# Patient Record
Sex: Female | Born: 1975 | Race: Black or African American | Hispanic: No | Smoking: Never smoker
Health system: Southern US, Community
[De-identification: ages and names within clinical notes are randomized; demographics above are authoritative.]

## PROBLEM LIST (undated history)

## (undated) DIAGNOSIS — G43909 Migraine, unspecified, not intractable, without status migrainosus: Secondary | ICD-10-CM

## (undated) DIAGNOSIS — D649 Anemia, unspecified: Secondary | ICD-10-CM

## (undated) DIAGNOSIS — Z853 Personal history of malignant neoplasm of breast: Secondary | ICD-10-CM

## (undated) DIAGNOSIS — Z9221 Personal history of antineoplastic chemotherapy: Secondary | ICD-10-CM

## (undated) DIAGNOSIS — C50919 Malignant neoplasm of unspecified site of unspecified female breast: Secondary | ICD-10-CM

## (undated) DIAGNOSIS — G620 Drug-induced polyneuropathy: Secondary | ICD-10-CM

## (undated) DIAGNOSIS — T7840XA Allergy, unspecified, initial encounter: Secondary | ICD-10-CM

## (undated) DIAGNOSIS — T451X5A Adverse effect of antineoplastic and immunosuppressive drugs, initial encounter: Secondary | ICD-10-CM

## (undated) DIAGNOSIS — Z923 Personal history of irradiation: Secondary | ICD-10-CM

## (undated) DIAGNOSIS — N6489 Other specified disorders of breast: Secondary | ICD-10-CM

## (undated) HISTORY — DX: Allergy, unspecified, initial encounter: T78.40XA

## (undated) HISTORY — PX: COSMETIC SURGERY: SHX468

## (undated) HISTORY — DX: Malignant neoplasm of unspecified site of unspecified female breast: C50.919

---

## 1998-06-18 ENCOUNTER — Other Ambulatory Visit: Admission: RE | Admit: 1998-06-18 | Discharge: 1998-06-18 | Payer: Self-pay | Admitting: Obstetrics

## 1998-06-18 ENCOUNTER — Encounter: Admission: RE | Admit: 1998-06-18 | Discharge: 1998-06-18 | Payer: Self-pay | Admitting: Obstetrics

## 1998-07-09 ENCOUNTER — Encounter: Admission: RE | Admit: 1998-07-09 | Discharge: 1998-07-09 | Payer: Self-pay | Admitting: Obstetrics

## 2001-02-20 ENCOUNTER — Inpatient Hospital Stay (HOSPITAL_COMMUNITY): Admission: AD | Admit: 2001-02-20 | Discharge: 2001-02-22 | Payer: Self-pay | Admitting: Obstetrics and Gynecology

## 2004-10-25 ENCOUNTER — Emergency Department (HOSPITAL_COMMUNITY): Admission: EM | Admit: 2004-10-25 | Discharge: 2004-10-25 | Payer: Self-pay | Admitting: Emergency Medicine

## 2004-11-15 ENCOUNTER — Emergency Department (HOSPITAL_COMMUNITY): Admission: EM | Admit: 2004-11-15 | Discharge: 2004-11-15 | Payer: Self-pay | Admitting: Emergency Medicine

## 2005-05-04 ENCOUNTER — Emergency Department (HOSPITAL_COMMUNITY): Admission: EM | Admit: 2005-05-04 | Discharge: 2005-05-04 | Payer: Self-pay | Admitting: Emergency Medicine

## 2007-06-25 ENCOUNTER — Inpatient Hospital Stay (HOSPITAL_COMMUNITY): Admission: AD | Admit: 2007-06-25 | Discharge: 2007-06-28 | Payer: Self-pay | Admitting: Obstetrics & Gynecology

## 2008-11-24 ENCOUNTER — Emergency Department (HOSPITAL_COMMUNITY): Admission: EM | Admit: 2008-11-24 | Discharge: 2008-11-24 | Payer: Self-pay | Admitting: Family Medicine

## 2008-11-26 ENCOUNTER — Emergency Department (HOSPITAL_COMMUNITY): Admission: EM | Admit: 2008-11-26 | Discharge: 2008-11-26 | Payer: Self-pay | Admitting: Family Medicine

## 2009-05-06 ENCOUNTER — Emergency Department (HOSPITAL_COMMUNITY): Admission: EM | Admit: 2009-05-06 | Discharge: 2009-05-06 | Payer: Self-pay | Admitting: Family Medicine

## 2009-05-07 ENCOUNTER — Emergency Department (HOSPITAL_COMMUNITY): Admission: EM | Admit: 2009-05-07 | Discharge: 2009-05-07 | Payer: Self-pay | Admitting: Family Medicine

## 2009-12-23 ENCOUNTER — Other Ambulatory Visit: Admission: RE | Admit: 2009-12-23 | Discharge: 2009-12-23 | Payer: Self-pay | Admitting: Obstetrics and Gynecology

## 2010-05-11 ENCOUNTER — Emergency Department (HOSPITAL_COMMUNITY)
Admission: EM | Admit: 2010-05-11 | Discharge: 2010-05-11 | Payer: Self-pay | Source: Home / Self Care | Admitting: Emergency Medicine

## 2010-08-16 LAB — POCT URINALYSIS DIP (DEVICE)
Bilirubin Urine: NEGATIVE
Hgb urine dipstick: NEGATIVE
Nitrite: NEGATIVE
Protein, ur: NEGATIVE mg/dL
pH: 7 (ref 5.0–8.0)

## 2010-10-01 NOTE — H&P (Signed)
[facility]  Patient:    Susan Wilson, Susan Wilson Visit Number: [id number] MRN: [id number]          Service Type: OBS Location: 910A [id number] Attending Physician:  Leonard Schwartz Dictated by:   Nigel Bridgeman, C.N.M. Admit Date:  02/20/2001                           History and Physical  DATE OF BIRTH:                [birth year]  HISTORY OF PRESENT ILLNESS:   Susan Wilson is a 35 year old, gravida 1, para 0, at 34-6/7 weeks by early ultrasound who presented to [facility] today as a new patient with labor diagnosed and cervix 5 cm, 100%, vertex, -1 station with bulging bag of water.  The patient had scheduled a new OB appointment next week and had previously reported prenatal care had occurred in [place], [place].  However, upon further investigation by telephone calls to the [facility] and the pregnancy care clinic that the patient noted she had received care, it appears the patient had no ongoing prenatal care at all.  Dr. Stefano Gaul has elected to continue to provide care for this patient since the patient had been approved to come to [facility] for care, although the practice was unaware of the lack of prenatal care.  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  She had a colposcopy in February 2000 at [facility], but no apparent followup occurred from that time.  3) History of HPV lesions.  PRENATAL LABORATORY DATA:     Have been drawn with sickle cell trait and are pending along with a CBC.  She did have a Pap smear in April 2002 that was within normal limits.  HISTORY OF PRESENT PREGNANCY: The patient denies any problems with this pregnancy.  She was originally seen in April 2002 at [facility].   She, at that time, was considering termination of pregnancy, but she did not return to that clinic. The patient denies any bleeding or any other problems during this pregnancy. The patients last menstrual period was June 30, 2000.  OBSTETRICAL HISTORY:          The patient is a primigravida.  PAST MEDICAL HISTORY:         She has a history of anemia for which she is taking a vitamin with iron.  She denies any other medical problems.  She does wear reading glasses.  MEDICATIONS:                  None.  ALLERGIES:                    None.  FAMILY HISTORY:               Her maternal grandmother and maternal grandfather are deceased from hypertension.  Her maternal grandfather also had diabetes.  Her maternal grandfather also had some type of cancer.  There is no evidence of any genetic anomalies in the family.  SOCIAL HISTORY:               She has had occasional alcohol use.  The patient is single.  She has  been previously employed.  Her family is involved and supportive.  She denies any alcohol, drug, or tobacco use during this pregnancy.  PHYSICAL EXAMINATION:  VITAL SIGNS:                  Stable.  The patient is afebrile.  HEENT:                        Within normal limits.  LUNGS:                        Bilateral breath sounds are clear.  HEART:                        Regular rate and rhythm without murmur.  BREASTS:                      Soft and nontender.  ABDOMEN:                      Fundal height is approximately 34 cm.  Estimated fetal weight 4 to 5 pounds.  Uterine contractions are every 3 minutes, moderate quality.  PELVIC:                       Cervical exam by Susan Wilson, C.N.M. was 5 cm, 100%, vertex, -1 station with bulging bag of water.  EXTREMITIES:                  Deep tendon reflexes are 2+ without clonus. There is a trace edema noted.  Fetal heart rate is reassuring with a negative spontaneous CST.  LABORATORY DATA:              Group B  strep culture was done at the office.  IMPRESSION:                   1. Intrauterine pregnancy at 34-6/7 weeks.                               2. No prenatal care.                               3. Preterm.  PLAN:                         1. Admit to birthing suite for consult with                                  Dr. Marline Wilson as attending physician.                               2. Routine physician orders.                               3. Plan group B Streptococcus prophylaxis with                                  penicillin G secondary to preterm status.  4. Prenatal package with sickle cell test was                                  drawn.                               5. NICU was notified of the patients preterm                                  status.                               6. Epidural and IV pain medication p.r.n. Dictated by:   Nigel Bridgeman, C.N.M. Attending Physician:  Leonard Schwartz DD:  02/20/01 TD:  02/20/01 Job: [id number]

## 2011-02-04 LAB — CBC
Hemoglobin: 10.3 — ABNORMAL LOW
MCHC: 32.8
MCHC: 33
MCV: 77.5 — ABNORMAL LOW
Platelets: 190
Platelets: 220
RBC: 3.8 — ABNORMAL LOW
RDW: 14.8
WBC: 13.4 — ABNORMAL HIGH

## 2014-10-20 ENCOUNTER — Other Ambulatory Visit: Payer: Self-pay

## 2014-10-20 ENCOUNTER — Emergency Department (HOSPITAL_COMMUNITY): Payer: 59

## 2014-10-20 ENCOUNTER — Encounter (HOSPITAL_COMMUNITY): Payer: Self-pay

## 2014-10-20 ENCOUNTER — Emergency Department (HOSPITAL_COMMUNITY)
Admission: EM | Admit: 2014-10-20 | Discharge: 2014-10-20 | Disposition: A | Discharge status: Home / Self Care | Payer: 59 | Attending: Emergency Medicine | Admitting: Emergency Medicine

## 2014-10-20 DIAGNOSIS — N6310 Unspecified lump in the right breast, unspecified quadrant: Secondary | ICD-10-CM

## 2014-10-20 DIAGNOSIS — R079 Chest pain, unspecified: Secondary | ICD-10-CM

## 2014-10-20 DIAGNOSIS — N63 Unspecified lump in breast: Secondary | ICD-10-CM | POA: Diagnosis not present

## 2014-10-20 LAB — CBC WITH DIFFERENTIAL/PLATELET
Basophils Absolute: 0 10*3/uL (ref 0.0–0.1)
Basophils Relative: 0 % (ref 0–1)
Eosinophils Absolute: 0.1 10*3/uL (ref 0.0–0.7)
Eosinophils Relative: 1 % (ref 0–5)
HCT: 36.3 % (ref 36.0–46.0)
Hemoglobin: 11.2 g/dL — ABNORMAL LOW (ref 12.0–15.0)
Lymphocytes Relative: 47 % — ABNORMAL HIGH (ref 12–46)
Lymphs Abs: 2.4 10*3/uL (ref 0.7–4.0)
MCH: 23.9 pg — ABNORMAL LOW (ref 26.0–34.0)
MCHC: 30.9 g/dL (ref 30.0–36.0)
MCV: 77.4 fL — ABNORMAL LOW (ref 78.0–100.0)
Monocytes Absolute: 0.4 10*3/uL (ref 0.1–1.0)
Monocytes Relative: 7 % (ref 3–12)
Neutro Abs: 2.3 10*3/uL (ref 1.7–7.7)
Neutrophils Relative %: 45 % (ref 43–77)
Platelets: 305 10*3/uL (ref 150–400)
RBC: 4.69 MIL/uL (ref 3.87–5.11)
RDW: 14.6 % (ref 11.5–15.5)
WBC: 5.2 10*3/uL (ref 4.0–10.5)

## 2014-10-20 LAB — BASIC METABOLIC PANEL
Anion gap: 7 (ref 5–15)
BUN: 14 mg/dL (ref 6–20)
CO2: 23 mmol/L (ref 22–32)
Calcium: 8.9 mg/dL (ref 8.9–10.3)
Chloride: 106 mmol/L (ref 101–111)
Creatinine, Ser: 0.72 mg/dL (ref 0.44–1.00)
GFR calc Af Amer: >60 mL/min (ref >60)
GFR calc non Af Amer: >60 mL/min (ref >60)
Glucose, Bld: 88 mg/dL (ref 65–99)
Potassium: 5 mmol/L (ref 3.5–5.1)
Sodium: 136 mmol/L (ref 135–145)

## 2014-10-20 LAB — I-STAT TROPONIN, ED: Troponin i, poc: 0 ng/mL (ref 0.00–0.08)

## 2014-10-20 MED ORDER — TRAMADOL HCL 50 MG PO TABS: 50.0000 mg | ORAL_TABLET | Freq: Four times a day (QID) | ORAL | Status: DC | PRN

## 2014-10-20 MED ORDER — NAPROXEN 500 MG PO TABS: 500.0000 mg | ORAL_TABLET | Freq: Two times a day (BID) | ORAL | Status: DC

## 2014-10-20 NOTE — ED Provider Notes (Signed)
CSN: [id number]     Arrival date & time 10/20/14  1145 History   First MD Initiated Contact with Patient 10/20/14 1634     Chief Complaint  Patient presents with  . Chest Pain     (Consider location/radiation/quality/duration/timing/severity/associated sxs/prior Treatment) HPI  Pt is a 39yo female presenting to ED with c/o gradually worsening Right breast mass for 1 month.  Pain is aching and sore, worse with palpation, worse when pt lies on her Right side. She has taken ibuprofen with minimal relief.  Pt states she tried to see her GYN but co-pay was $60.  Pt states she has had mild intermittent lightheadedness with Right sided breast pain earlier this week but none today. Denies fever, chills, n/v/d. Denies cough, congestion, SOB.  Denies unexplained weight loss. Denies nipple discharge. Denies skin changes. Denies hx of abscesses. Denies hx of cancer, CAD, or asthma.  History reviewed. No pertinent past medical history. History reviewed. No pertinent past surgical history. No family history on file. History  Substance Use Topics  . Smoking status: Never Smoker   . Smokeless tobacco: Not on file  . Alcohol Use: Yes     Comment: rarely   OB History    No data available     Review of Systems  Constitutional: Negative for fever, chills, diaphoresis, appetite change and fatigue.  Respiratory: Negative for cough and shortness of breath.   Cardiovascular: Positive for chest pain. Negative for palpitations. Leg swelling: Under Right breast.  Skin: Negative for rash and wound.  All other systems reviewed and are negative.     Allergies  Review of patient's allergies indicates no known allergies.  Home Medications   Prior to Admission medications   Medication Sig Start Date End Date Taking? Authorizing Provider  cyclobenzaprine (FLEXERIL) 10 MG tablet Take 10 mg by mouth 3 (three) times daily as needed for muscle spasms.   Yes Historical Provider, MD  ibuprofen (ADVIL,MOTRIN) 200  MG tablet Take 200 mg by mouth every 6 (six) hours as needed.   Yes Historical Provider, MD  naproxen (NAPROSYN) 500 MG tablet Take 1 tablet (500 mg total) by mouth 2 (two) times daily. 10/20/14   Noland Fordyce, PA-C  traMADol (ULTRAM) 50 MG tablet Take 1 tablet (50 mg total) by mouth every 6 (six) hours as needed. 10/20/14   Noland Fordyce, PA-C   BP 134/97 mmHg  Pulse 72  Temp(Src) 98.5 F (36.9 C) (Oral)  Resp 18  Ht 5' 5"  (1.651 m)  Wt 189 lb (85.73 kg)  BMI 31.45 kg/m2  SpO2 100%  LMP 09/22/2014 Physical Exam  Constitutional: She appears well-developed and well-nourished. No distress.  HENT:  Head: Normocephalic and atraumatic.  Eyes: Conjunctivae are normal. No scleral icterus.  Neck: Normal range of motion.  Cardiovascular: Normal rate, regular rhythm and normal heart sounds.   Pulmonary/Chest: Effort normal and breath sounds normal. No respiratory distress. She has no wheezes. She has no rales. She exhibits tenderness. Right breast exhibits mass (Under fold of Right breast) and tenderness. Right breast exhibits no inverted nipple and no nipple discharge. Left breast exhibits no inverted nipple, no mass, no nipple discharge, no skin change and no tenderness.    No respiratory distress, able to speak in full sentences w/o difficulty. Lungs: CTAB Pendulous breasts, 3cm area of tough skin c/w scar/fibrous tissue, tender to palpation, additional 2cm area of extending tough skin to center of chest over distal aspect sternum, non-tender. Non-mobile. No fluctuance. No erythema or ecchymosis. No  red streaking.   Abdominal: Soft. Bowel sounds are normal. She exhibits no distension and no mass. There is no tenderness. There is no rebound and no guarding.  Musculoskeletal: Normal range of motion.  Neurological: She is alert.  Skin: Skin is warm and dry. She is not diaphoretic.  Nursing note and vitals reviewed.   ED Course  Procedures (including critical care time) Labs Review Labs  Reviewed  CBC WITH DIFFERENTIAL/PLATELET - Abnormal; Notable for the following:    Hemoglobin 11.2 (*)    MCV 77.4 (*)    MCH 23.9 (*)    Lymphocytes Relative 47 (*)    All other components within normal limits  BASIC METABOLIC PANEL  I-STAT TROPOININ, ED    Imaging Review Dg Chest 2 View  10/20/2014   CLINICAL DATA:  Chest tightness for a few days.  EXAM: CHEST  2 VIEW  COMPARISON:  None.  FINDINGS: Normal heart size and mediastinal contours. No acute infiltrate or edema. No effusion or pneumothorax. No acute osseous findings.  IMPRESSION: Negative chest.   Electronically Signed   By: Monte Fantasia M.D.   On: 10/20/2014 12:38     EKG Interpretation   Date/Time:  Monday October 20 2014 11:55:45 EDT Ventricular Rate:  75 PR Interval:  168 QRS Duration: 80 QT Interval:  362 QTC Calculation: 404 R Axis:   28 Text Interpretation:  Normal sinus rhythm Low voltage QRS Cannot rule out  Anterior infarct , age undetermined Abnormal ECG No old tracing to compare  Confirmed by BELFI  MD, MELANIE ([id number]) on 10/20/2014 5:23:28 PM      MDM   Final diagnoses:  Right-sided chest pain  Breast mass, right    Pt c/o Right breast mass for 1 month, gradually worsening in size and pain. Denies fever, n/v/d. Denies SOB.  On exam, pt has pendulous breasts, rough skin that is tender to palpation under Right breast. Skin texture c/w scar tissue/fibrous tissue. Mass is not mobile. No erythema or fluctuance. Not c/w abscess. Labs: unremarkable. Pt is afebrile. Lungs: CTAB. CXR: negative.   Doubt ACS, PE, pneumonia, pneumothorax. Low concern for soft tissue infection.   Strongly encouraged pt to f/u with Breast Center for additional imaging of mass. Also encouraged to f/u with her GYN or [facility] clinic. Information for [facility] also provided. Rx: tramadol and naproxen.  Return precautions provided. Pt verbalized understanding and agreement with tx plan.      Noland Fordyce, PA-C 10/20/14  [place], MD 10/20/14 [phone]

## 2014-10-20 NOTE — Discharge Instructions (Signed)
°  Breast Biopsy A breast biopsy is a test during which a sample of tissue is taken from your breast. The breast tissue is looked at under a microscope for cancer cells.  BEFORE THE PROCEDURE  Make plans to have someone drive you home after the test.  Do not smoke for 2 weeks before the test. Stop smoking, if you smoke.  Do not drink alcohol for 24 hours before the test.  Wear a good support bra to the test. PROCEDURE  You may be given one of the following:  A medicine to numb the breast area (local anesthetic).  A medicine to make you fall asleep (general anesthetic). There are different types of breast biopsies. They include:  Fine-needle aspiration.  A needle is put into the breast lump.  The needle takes out fluid and cells from the lump.  Ultrasound imaging may be used to help find the lump and to put the needle in the right spot.  Core-needle biopsy.  A needle is put into the breast lump.  The needle is put in your breast 3-6 times.  The needle removes breast tissue.  An ultrasound image or X-ray is often used to find the right spot to put in the needle.  Stereotactic biopsy.  X-rays and a computer are used to study X-ray pictures of the breast lump.  The computer finds where the needle needs to be put into the breast.  Tissue samples are taken out.  Vacuum-assisted biopsy.  A small cut (incision) is made in your breast.  A biopsy device is put through the cut and into the breast tissue.  The biopsy device draws abnormal breast tissue into the biopsy device.  A large tissue sample is often removed.  No stitches are needed.  Ultrasound-guided core-needle biopsy.  Ultrasound imaging helps guide the needle into the area of the breast that is not normal.  A cut is made in the breast. The needle is put into the breast lump.  Tissue samples are taken out.  Open biopsy.  A large cut is made in the breast.  Your doctor will try to remove the whole  breast lump or as much as possible. All tissue, fluid, or cell samples are looked at under a microscope.  AFTER THE PROCEDURE  You will be taken to an area to recover. You will be able to go home once you are doing well and are without problems.  You may have bruising on your breast. This is normal.  A pressure bandage (dressing) may be put on your breast for 24-48 hours. This type of bandage is wrapped tightly around your chest. It helps stop fluid from building up underneath tissues. Document Released: 07/25/2011 Document Revised: 09/16/2013 Document Reviewed: 07/25/2011 [facility] Patient Information 2015 [place], [place]. This information is not intended to replace advice given to you by your health care provider. Make sure you discuss any questions you have with your health care provider.

## 2014-10-20 NOTE — ED Notes (Signed)
Pt has been having chest pain off and on for over the past month. Pt found a knot under her right breast a month ago and tried to get in to see her GYN but couldn't and it hurts to sleep on her right side. Has been having a little lightheadedness with the cp this week.

## 2014-11-14 DIAGNOSIS — Z853 Personal history of malignant neoplasm of breast: Secondary | ICD-10-CM

## 2014-11-14 HISTORY — DX: Personal history of malignant neoplasm of breast: Z85.3

## 2014-11-19 ENCOUNTER — Ambulatory Visit (INDEPENDENT_AMBULATORY_CARE_PROVIDER_SITE_OTHER): Payer: 59 | Admitting: Family Medicine

## 2014-11-19 ENCOUNTER — Encounter: Payer: Self-pay | Admitting: Family Medicine

## 2014-11-19 VITALS — BP 127/79 | HR 77 | Temp 98.3°F | Ht 65.0 in | Wt 218.9 lb

## 2014-11-19 DIAGNOSIS — G43909 Migraine, unspecified, not intractable, without status migrainosus: Secondary | ICD-10-CM | POA: Insufficient documentation

## 2014-11-19 DIAGNOSIS — N63 Unspecified lump in breast: Secondary | ICD-10-CM | POA: Diagnosis not present

## 2014-11-19 DIAGNOSIS — G43009 Migraine without aura, not intractable, without status migrainosus: Secondary | ICD-10-CM

## 2014-11-19 DIAGNOSIS — N6310 Unspecified lump in the right breast, unspecified quadrant: Secondary | ICD-10-CM

## 2014-11-19 NOTE — Progress Notes (Signed)
    Subjective:    Patient ID: Susan Wilson is a 39 y.o. female presenting with Breast Mass  on 11/19/2014  HPI: Breast lump since May.  Feels swollen and tender with cycles. Not really enlarging.  Review of Systems  Constitutional: Negative for fever and chills.  Respiratory: Negative for shortness of breath.   Cardiovascular: Negative for chest pain.  Gastrointestinal: Negative for nausea, vomiting and abdominal pain.  Genitourinary: Negative for dysuria.  Skin: Negative for rash.      Objective:    BP 127/79 mmHg  Pulse 77  Temp(Src) 98.3 F (36.8 C)  Ht 5' 5"  (1.651 m)  Wt 218 lb 14.4 oz (99.292 kg)  BMI 36.43 kg/m2  LMP 10/22/2014 Physical Exam  Constitutional: She is oriented to person, place, and time. She appears well-developed and well-nourished. No distress.  HENT:  Head: Normocephalic and atraumatic.  Eyes: No scleral icterus.  Neck: Neck supple.  Cardiovascular: Normal rate.   Pulmonary/Chest: Effort normal.    Abdominal: Soft.  Neurological: She is alert and oriented to person, place, and time.  Skin: Skin is warm and dry.  Psychiatric: She has a normal mood and affect.        Assessment & Plan:   Problem List Items Addressed This Visit      Unprioritized   Migraine - Primary   Mass of breast, right    Very concerning for cancer.  Immediate referral to the breast center.      Relevant Orders   US BREAST LTD UNI RIGHT INC AXILLA   MM DIAG BREAST TOMO UNI RIGHT       Segundo Makela S 11/19/2014 4:07 PM

## 2014-11-19 NOTE — Patient Instructions (Addendum)
Breast Biopsy A breast biopsy is a procedure where a sample of breast tissue is removed from your breast. The tissue is examined under a microscope to see if cancerous cells are present. A breast biopsy is done when there is:  Any undiagnosed breast mass (tumor).  Nipple abnormalities, dimpling, crusting, or ulcerations.  Abnormal discharge from the nipple, especially blood.  Redness, swelling, and pain of the breast.  Calcium deposits (calcifications) or abnormalities seen on a mammogram, ultrasound result, or results of magnetic resonance imaging (MRI).  Suspicious changes in the breast seen on your mammogram. If the tumor is found to be cancerous (malignant), a breast biopsy can help to determine what the best treatment is for you. There are many different types of breast biopsies. Talk to your caregiver about your options and which type is best for you. LET YOUR CAREGIVER KNOW ABOUT:  Allergies to food or medicine.  Medicines taken, including vitamins, herbs, eyedrops, over-the-counter medicines, and creams.  Use of steroids (by mouth or creams).  Previous problems with anesthetics or numbing medicines.  History of bleeding problems or blood clots.  Previous surgery.  Other health problems, including diabetes and kidney problems.  Any recent colds or infections.  Possibility of pregnancy, if this applies. RISKS AND COMPLICATIONS   Bleeding.  Infection.  Allergy to medicines.  Bruising and swelling of the breast.  Alteration in the shape of the breast.  Not finding the lump or abnormality.  Needing more surgery. BEFORE THE PROCEDURE  Arrange for someone to drive you home after the procedure.  Do not smoke for 2 weeks before the procedure. Stop smoking, if you smoke.  Do not drink alcohol for 24 hours before procedure.  Wear a good support bra to the procedure. PROCEDURE  You may be given a medicine to numb the breast area (local anesthesia) or a medicine  to make you sleep (general anesthesia) during the procedure. The following are the different types of biopsies that can be performed.   Fine-needle aspiration--A thin needle is attached to a syringe and inserted into the breast lump. Fluid and cells are removed and then looked at under a microscope. If the breast lump cannot be felt, an ultrasound may be used to help locate the lump and place the needle in the correct area.   Core needle biopsy--A wide, hollow needle (core needle) is inserted into the breast lump 3-6 times to get tissue samples or cores. The samples are removed. The needle is usually placed in the correct area by using an ultrasound or X-ray.   Stereotactic biopsy--X-ray equipment and a computer are used to analyze X-ray pictures of the breast lump. The computer then finds exactly where the core needle needs to be inserted. Tissue samples are removed.   Vacuum-assisted biopsy--A small incision (less than  inch) is made in your breast. A biopsy device that includes a hollow needle and vacuum is passed through the incision and into the breast tissue. The vacuum gently draws abnormal breast tissue into the needle to remove it. This type of biopsy removes a larger tissue sample than a regular core needle biopsy. No stitches are needed, and there is usually little scarring.  Ultrasound-guided core needle biopsy--A high frequency ultrasound helps guide the core needle to the area of the mass or abnormality. An incision is made to insert the needle. Tissue samples are removed.  Open biopsy--A larger incision is made in the breast. Your caregiver will attempt to remove the whole breast lump or  as much as possible. AFTER THE PROCEDURE  You will be taken to the recovery area. If you are doing well and have no problems, you will be allowed to go home.  You may notice bruising on your breast. This is normal.  Your caregiver may apply a pressure dressing on your breast for 24-48 hours. A  pressure dressing is a bandage that is wrapped tightly around the chest to stop fluid from collecting underneath tissues. Document Released: 05/02/2005 Document Revised: 08/27/2012 Document Reviewed: 06/02/2011 [facility] Patient Information 2015 [place], [place]. This information is not intended to replace advice given to you by your health care provider. Make sure you discuss any questions you have with your health care provider.

## 2014-11-19 NOTE — Assessment & Plan Note (Signed)
Very concerning for cancer.  Immediate referral to the breast center.

## 2014-11-20 ENCOUNTER — Other Ambulatory Visit: Payer: Self-pay | Admitting: Family Medicine

## 2014-11-20 ENCOUNTER — Ambulatory Visit
Admission: RE | Admit: 2014-11-20 | Discharge: 2014-11-20 | Disposition: A | Discharge status: Home / Self Care | Payer: 59 | Source: Ambulatory Visit | Attending: Family Medicine | Admitting: Family Medicine

## 2014-11-20 DIAGNOSIS — R599 Enlarged lymph nodes, unspecified: Secondary | ICD-10-CM

## 2014-11-20 DIAGNOSIS — N6310 Unspecified lump in the right breast, unspecified quadrant: Secondary | ICD-10-CM

## 2014-11-21 ENCOUNTER — Ambulatory Visit
Admission: RE | Admit: 2014-11-21 | Discharge: 2014-11-21 | Disposition: A | Discharge status: Home / Self Care | Payer: 59 | Source: Ambulatory Visit | Attending: Family Medicine | Admitting: Family Medicine

## 2014-11-21 ENCOUNTER — Other Ambulatory Visit: Payer: 59

## 2014-11-21 ENCOUNTER — Other Ambulatory Visit: Payer: Self-pay | Admitting: Family Medicine

## 2014-11-21 DIAGNOSIS — N6310 Unspecified lump in the right breast, unspecified quadrant: Secondary | ICD-10-CM

## 2014-11-21 DIAGNOSIS — R599 Enlarged lymph nodes, unspecified: Secondary | ICD-10-CM

## 2014-11-24 ENCOUNTER — Ambulatory Visit
Admission: RE | Admit: 2014-11-24 | Discharge: 2014-11-24 | Disposition: A | Discharge status: Home / Self Care | Payer: 59 | Source: Ambulatory Visit | Attending: Family Medicine | Admitting: Family Medicine

## 2014-11-24 DIAGNOSIS — N6310 Unspecified lump in the right breast, unspecified quadrant: Secondary | ICD-10-CM

## 2014-11-25 ENCOUNTER — Encounter: Payer: Self-pay | Admitting: *Deleted

## 2014-11-25 ENCOUNTER — Telehealth: Payer: Self-pay | Admitting: *Deleted

## 2014-11-25 DIAGNOSIS — C50311 Malignant neoplasm of lower-inner quadrant of right female breast: Secondary | ICD-10-CM | POA: Insufficient documentation

## 2014-11-25 DIAGNOSIS — Z171 Estrogen receptor negative status [ER-]: Secondary | ICD-10-CM

## 2014-11-25 NOTE — Telephone Encounter (Signed)
Confirmed [facility] for 12/03/14 at 0800.  Instructions and contact information given.

## 2014-12-03 ENCOUNTER — Ambulatory Visit: Payer: 59

## 2014-12-03 ENCOUNTER — Encounter: Payer: Self-pay | Admitting: Skilled Nursing Facility1

## 2014-12-03 ENCOUNTER — Ambulatory Visit
Admission: RE | Admit: 2014-12-03 | Discharge: 2014-12-03 | Disposition: A | Discharge status: Home / Self Care | Payer: 59 | Source: Ambulatory Visit | Attending: Radiation Oncology | Admitting: Radiation Oncology

## 2014-12-03 ENCOUNTER — Encounter: Payer: Self-pay | Admitting: Oncology

## 2014-12-03 ENCOUNTER — Telehealth: Payer: Self-pay | Admitting: Oncology

## 2014-12-03 ENCOUNTER — Other Ambulatory Visit: Payer: Self-pay | Admitting: General Surgery

## 2014-12-03 ENCOUNTER — Ambulatory Visit: Payer: 59 | Attending: General Surgery | Admitting: Physical Therapy

## 2014-12-03 ENCOUNTER — Ambulatory Visit (HOSPITAL_BASED_OUTPATIENT_CLINIC_OR_DEPARTMENT_OTHER): Payer: 59 | Admitting: Oncology

## 2014-12-03 ENCOUNTER — Encounter: Payer: Self-pay | Admitting: Adult Health

## 2014-12-03 ENCOUNTER — Other Ambulatory Visit (HOSPITAL_BASED_OUTPATIENT_CLINIC_OR_DEPARTMENT_OTHER): Payer: 59

## 2014-12-03 ENCOUNTER — Encounter: Payer: Self-pay | Admitting: Physical Therapy

## 2014-12-03 ENCOUNTER — Encounter: Payer: Self-pay | Admitting: *Deleted

## 2014-12-03 VITALS — BP 125/79 | HR 88 | Temp 98.5°F | Resp 18 | Ht 65.0 in | Wt 220.6 lb

## 2014-12-03 DIAGNOSIS — Z171 Estrogen receptor negative status [ER-]: Secondary | ICD-10-CM

## 2014-12-03 DIAGNOSIS — C50311 Malignant neoplasm of lower-inner quadrant of right female breast: Secondary | ICD-10-CM

## 2014-12-03 DIAGNOSIS — R293 Abnormal posture: Secondary | ICD-10-CM | POA: Insufficient documentation

## 2014-12-03 DIAGNOSIS — G43009 Migraine without aura, not intractable, without status migrainosus: Secondary | ICD-10-CM

## 2014-12-03 LAB — COMPREHENSIVE METABOLIC PANEL (CC13)
ALBUMIN: 3.9 g/dL (ref 3.5–5.0)
ALT: 17 U/L (ref 0–55)
ANION GAP: 7 meq/L (ref 3–11)
AST: 14 U/L (ref 5–34)
Alkaline Phosphatase: 59 U/L (ref 40–150)
BILIRUBIN TOTAL: 0.46 mg/dL (ref 0.20–1.20)
BUN: 16.9 mg/dL (ref 7.0–26.0)
CHLORIDE: 106 meq/L (ref 98–109)
CO2: 24 mEq/L (ref 22–29)
Calcium: 9.3 mg/dL (ref 8.4–10.4)
Creatinine: 0.8 mg/dL (ref 0.6–1.1)
EGFR: >90 mL/min/{1.73_m2} (ref >90)
Glucose: 100 mg/dl (ref 70–140)
Potassium: 4.3 mEq/L (ref 3.5–5.1)
Sodium: 137 mEq/L (ref 136–145)
TOTAL PROTEIN: 7.5 g/dL (ref 6.4–8.3)

## 2014-12-03 LAB — CBC WITH DIFFERENTIAL/PLATELET
BASO%: 0.2 % (ref 0.0–2.0)
Basophils Absolute: 0 10*3/uL (ref 0.0–0.1)
EOS%: 1.4 % (ref 0.0–7.0)
Eosinophils Absolute: 0.1 10*3/uL (ref 0.0–0.5)
HCT: 36.3 % (ref 34.8–46.6)
HGB: 11.3 g/dL — ABNORMAL LOW (ref 11.6–15.9)
LYMPH%: 36.9 % (ref 14.0–49.7)
MCH: 24.2 pg — AB (ref 25.1–34.0)
MCHC: 31.1 g/dL — AB (ref 31.5–36.0)
MCV: 77.9 fL — AB (ref 79.5–101.0)
MONO#: 0.4 10*3/uL (ref 0.1–0.9)
MONO%: 7.1 % (ref 0.0–14.0)
NEUT%: 54.4 % (ref 38.4–76.8)
NEUTROS ABS: 3.1 10*3/uL (ref 1.5–6.5)
Platelets: 304 10*3/uL (ref 145–400)
RBC: 4.66 10*6/uL (ref 3.70–5.45)
RDW: 14.7 % — ABNORMAL HIGH (ref 11.2–14.5)
WBC: 5.6 10*3/uL (ref 3.9–10.3)
lymph#: 2.1 10*3/uL (ref 0.9–3.3)

## 2014-12-03 NOTE — Progress Notes (Signed)
[place]  [phone] Fax:([phone]     ID: Susan Wilson DOB: Oct 03, 1975  MR#: [id number]  JAS#:[id number]  Patient Care Team: Donnamae Jude, MD as PCP - General (Obstetrics and Gynecology) Rolm Bookbinder, MD as Consulting Physician (General Surgery) Chauncey Cruel, MD as Consulting Physician (Oncology) Thea Silversmith, MD as Consulting Physician (Radiation Oncology) [name], RN as Registered Nurse Rockwell Germany, RN as Registered Nurse PCP: Donnamae Jude, MD OTHER MD:  CHIEF COMPLAINT: Estrogen receptor negative breast cancer  CURRENT TREATMENT: Neoadjuvant chemotherapy   BREAST CANCER HISTORY: Susan Wilson herself noted a mass in her right breast sometime around April. Initially she thought it might be related to menstruation, but as it did not change and eventually became tender, she brought it to her physician's attention. On 11/20/2014 patient underwent bilateral diagnostic mammography with tomosynthesis and right breast ultrasonography at the breast Center. The breast density was category B. There was a hyperdense mass in the right lower inner quadrant associated with skin thickening. There was also a 5 mm nodule posteriorly at the 8:30 o'clock position in the right breast. There were several hyperdense nodules in the right axilla. On physical exam there was a firm fixed mass in the right breast at the 5:00 position. By ultrasound this was lobulated and appear to involve the skin. It measures up to 4.1 cm. There was no sonographic correlation to the 5 mm nodule seen in a different area of the right breast. The right axilla showed 3 hypervascular lymph nodes with prominent cortical thickening, measuring less than 1.5 cm.  On 11/21/2014 the patient underwent right breast biopsy (5:00 mass) and biopsy of one of the suspicious right axillary lymph nodes. 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  The patient's subsequent history is as detailed below  INTERVAL HISTORY: Susan Wilson was evaluated in the multidisciplinary breast cancer clinic 12/03/2014 accompanied by her husband Antrell, her mother Peter Congo, and her sister Oley Balm. Her case was also presented at the multidisciplinary breast cancer conference that same morning. At that point a preliminary plan was proposed, for genetics consultation, breast MRI, biopsy of the second suspicious mass in the right breast, neoadjuvant chemotherapy prior to definitive surgery, and clarification through repeat testing of the HER-2 equivocal result  REVIEW OF SYSTEMS: There were no specific symptoms leading to the original mammogram, which was routinely scheduled. The patient denies unusual headaches, visual changes, nausea, vomiting, stiff neck, dizziness, or gait imbalance. There has been no cough, phlegm production, or pleurisy, no chest pain or pressure, and no change in bowel or bladder habits. The patient denies fever, rash, bleeding, unexplained fatigue or unexplained weight loss. The patient does complain of migraines, which is a long-standing problem not more intense or persistent than usual. She has chronic back pain, which is mild. Sometimes she feels fatigued and this keeps her from doing a lot of lifting or bending. She has seasonal allergies. A detailed review of systems was otherwise entirely negative.  PAST MEDICAL HISTORY: Past Medical History  Diagnosis Date  . 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 Breast cancer   . Hernia, umbilical   . Back pain   . Headache     PAST SURGICAL HISTORY: History reviewed. No pertinent  past surgical history.  FAMILY HISTORY Family History  Problem Relation Age of  Onset  . Breast cancer Maternal Grandmother 9  . Colon cancer Maternal Uncle   . Mesothelioma Maternal Grandfather   . Liver cancer Maternal Uncle    the patient's parents are living, her father being 59 and her mother 77 as of July 2016. The patient had no brothers. One sister died at age 79 from cardiac problems. The other sister is in good health. On the maternal side there is a history of colon cancer and an uncle age 37, liver cancer in a great uncle and mesothelioma in the maternal grandfather.  GYNECOLOGIC HISTORY:  Patient's last menstrual period was 10/22/2014. Menarche age 44. The patient is GX P2. She still having regular periods  SOCIAL HISTORY:  Merrisa works in Therapist, art for [employer]. Her husband Antrell works for [employer]. The daughters are [place] and Lovie Macadamia, age 56 and 59. The patient attends a local [place]: Not in place   HEALTH MAINTENANCE: History  Substance Use Topics  . Smoking status: Never Smoker   . Smokeless tobacco: Never Used  . Alcohol Use: Yes     Comment: rarely     Colonoscopy:  PAP: 2014  Bone density:  Lipid panel:  No Known Allergies  Current Outpatient Prescriptions  Medication Sig Dispense Refill  . ibuprofen (ADVIL,MOTRIN) 200 MG tablet Take 200 mg by mouth every 6 (six) hours as needed.    . naproxen (NAPROSYN) 500 MG tablet Take 1 tablet (500 mg total) by mouth 2 (two) times daily. 30 tablet 0  . traMADol (ULTRAM) 50 MG tablet Take 1 tablet (50 mg total) by mouth every 6 (six) hours as needed. 15 tablet 0  . cyclobenzaprine (FLEXERIL) 10 MG tablet Take 10 mg by mouth 3 (three) times daily as needed for muscle spasms.     No current facility-administered medications for this visit.    OBJECTIVE: Young African-American woman who appears well Filed Vitals:   12/03/14 0857  BP: 125/79  Pulse: 88  Temp: 98.5 F (36.9 C)  Resp: 18     Body mass index is 36.71 kg/(m^2).     ECOG FS:0 - Asymptomatic  Ocular: Sclerae unicteric, pupils equal, round and reactive to light Ear-nose-throat: Oropharynx clear and moist Lymphatic: No cervical or supraclavicular adenopathy Lungs no rales or rhonchi, good excursion bilaterally Heart regular rate and rhythm, no murmur appreciated Abd soft, nontender, positive bowel sounds MSK no focal spinal tenderness, no joint edema Neuro: non-focal, well-oriented, appropriate affect Breasts: The right breast is status post recent biopsy. There is an easily palpable mass in the lower outer quadrant which measures approximately 3 cm. I do not notice skin erythema or evidence of inflammatory breast cancer. There is no nipple change. The right axilla is benign. The left breast is unremarkable   LAB RESULTS:  CMP     Component Value Date/Time   NA 137 12/03/2014 0814   NA 136 10/20/2014 1225   K 4.3 12/03/2014 0814   K 5.0 10/20/2014 1225   CL 106 10/20/2014 1225   CO2 24 12/03/2014 0814   CO2 23 10/20/2014 1225   GLUCOSE 100 12/03/2014 0814   GLUCOSE 88 10/20/2014 1225   BUN 16.9 12/03/2014 0814   BUN 14 10/20/2014 1225   CREATININE 0.8 12/03/2014 0814   CREATININE 0.72 10/20/2014 1225   CALCIUM 9.3 12/03/2014 0814   CALCIUM 8.9 10/20/2014 1225   PROT 7.5  12/03/2014 0814   ALBUMIN 3.9 12/03/2014 0814   AST 14 12/03/2014 0814   ALT 17 12/03/2014 0814   ALKPHOS 59 12/03/2014 0814   BILITOT 0.46 12/03/2014 0814   GFRNONAA >60 10/20/2014 1225   GFRAA >60 10/20/2014 1225    INo results found for: SPEP, UPEP  Lab Results  Component Value Date   WBC 5.6 12/03/2014   NEUTROABS 3.1 12/03/2014   HGB 11.3* 12/03/2014   HCT 36.3 12/03/2014   MCV 77.9* 12/03/2014   PLT 304 12/03/2014      Chemistry      Component Value Date/Time   NA 137 12/03/2014 0814   NA 136 10/20/2014 1225   K 4.3 12/03/2014 0814   K 5.0 10/20/2014 1225   CL 106 10/20/2014 1225   CO2 24 12/03/2014 0814   CO2 23 10/20/2014 1225   BUN 16.9  12/03/2014 0814   BUN 14 10/20/2014 1225   CREATININE 0.8 12/03/2014 0814   CREATININE 0.72 10/20/2014 1225      Component Value Date/Time   CALCIUM 9.3 12/03/2014 0814   CALCIUM 8.9 10/20/2014 1225   ALKPHOS 59 12/03/2014 0814   AST 14 12/03/2014 0814   ALT 17 12/03/2014 0814   BILITOT 0.46 12/03/2014 0814       No results found for: LABCA2  No components found for: LABCA125  No results for input(s): INR in the last 168 hours.  Urinalysis    Component Value Date/Time   LABSPEC 1.020 05/06/2009 1439   PHURINE 7.0 05/06/2009 1439   GLUCOSEU NEGATIVE 05/06/2009 1439   HGBUR NEGATIVE 05/06/2009 1439   BILIRUBINUR NEGATIVE 05/06/2009 1439   KETONESUR NEGATIVE 05/06/2009 1439   PROTEINUR NEGATIVE 05/06/2009 1439   UROBILINOGEN 0.2 05/06/2009 1439   NITRITE NEGATIVE 05/06/2009 1439   LEUKOCYTESUR  05/06/2009 1439    NEGATIVE Biochemical Testing Only. Please order routine urinalysis from main lab if confirmatory testing is needed.    STUDIES: Mm Digital Diagnostic Unilat R  11/21/2014   CLINICAL DATA:  Status post ultrasound-guided core needle biopsy of right breast mass, and a right axillary lymph node.  EXAM: DIAGNOSTIC RIGHT MAMMOGRAM POST ULTRASOUND BIOPSY  COMPARISON:  Previous exam(s).  FINDINGS: Mammographic images were obtained following ultrasound guided biopsy of right breast 5 o'clock mass, and right axillary lymph node. Mammographic views demonstrate placement of ribbon shaped marker within right breast 5 o'clock mass, and coil shaped marker within right axillary lymph node.  IMPRESSION: Successful post biopsy marker placement within the right 5 o'clock breast mass, and right axillary lymph node.  Final Assessment: Post Procedure Mammograms for Marker Placement   Electronically Signed   By: Fidela Salisbury M.D.   On: 11/21/2014 16:20   Mm Radiologist Eval And Mgmt  11/24/2014   EXAM: ESTABLISHED PATIENT OFFICE VISIT - LEVEL II  CHIEF COMPLAINT: Status post  ultrasound-guided core needle biopsy of the right breast and a right axillary lymph node.  HISTORY OF PRESENT ILLNESS: 39 year old woman with self palpated right breast mass, and a prominent lymph node seen mammographically.  EXAM: Patient presented for discussion of post biopsy pathology results. She describes residual tenderness in her right breast, but not right axilla. On physical exam, the biopsy site demonstrates mild hematoma, without evidence of infection. The skin is healing normally.  PATHOLOGY: Right breast mass:  Invasive mammary carcinoma, grade 2-3.  Right axillary lymph :  Benign lymph node.  ASSESSMENT AND PLAN: ASSESSMENT AND PLAN Pathology results were discussed with the patient and her husband.  She was given a referral for multidisciplinary clinic at [facility] to discuss treatment options and establish treatment plan. Educational materials were also provided to the patient.  The patient expresses understanding of the pathology results, and potential treatment options.   Electronically Signed   By: Fidela Salisbury M.D.   On: 11/24/2014 17:31   US Breast Ltd Uni Right Inc Axilla  11/20/2014   CLINICAL DATA:  Right breast lower inner quadrant area of palpable concern, with associated intermittent tenderness, felt 3 months ago.  EXAM: DIGITAL DIAGNOSTIC BILATERAL MAMMOGRAM WITH 3D TOMOSYNTHESIS WITH CAD  ULTRASOUND RIGHT BREAST  COMPARISON:  None.  ACR Breast Density Category b: There are scattered areas of fibroglandular density.  FINDINGS: There is a hyperdense mostly circumscribed mass at the site of palpable concern, in the right breast lower inner quadrant, posterior depth. There is an associated skin thickening overlying the mass. Mammographically, there is also a 5 mm nodule in the right 8:30 o'clock breast, posterior depth, seen better on the craniocaudal view. Several hyperdense prominent lymph nodes are seen in the right axilla.  There are no suspicious masses areas of  architectural distortion or microcalcifications in the left breast.  Mammographic images were processed with CAD.  On physical exam, there is a firm fixed palpable mass in the right 5 o'clock breast, far posterior depth.  Targeted ultrasound is performed, showing corresponding right breast 5 o'clock 10 cm from the nipple hypoechoic lobulated irregular-bordered mass which is superficially located with apparent involvement of the skin. The mass measures 2.1 by 4.1 by 3.5 cm. and demonstrates increased internal vascularity. No sonographic correlation is found for the 8:30 o'clock mammographically seen 5 mm nodule.  Sonographic evaluation of the right axilla demonstrates 3 hypervascular lymph nodes with prominent cortical thickening, measuring less than 1.5 cm in long-axis.  IMPRESSION: Palpable highly suspicious mass in the right 5 o'clock breast, with apparent skin involvement, and associated right axillary lymphadenopathy.  5 mm mammographically seen nodule at 8:30 o'clock, posterior depth, without sonographic correlation.  No evidence of malignancy within the left breast.  RECOMMENDATION: Ultrasound-guided core needle biopsy of the right breast mass and 1 of the right axillary lymph nodes.  MRI of the breast may also be considered, to further evaluate the mammographically seen 8:30 o'clock 5 mm nodule. Alternatively, a 3D guided stereotactic biopsy may be considered.  The patient is scheduled for a ultrasound-guided core needle biopsy on 11/21/2014 at 2:30 o'clock at the [facility].  I have discussed the findings and recommendations with the patient. Results were also provided in writing at the conclusion of the visit. If applicable, a reminder letter will be sent to the patient regarding the next appointment.  BI-RADS CATEGORY  5: Highly suggestive of malignancy.   Electronically Signed   By: Fidela Salisbury M.D.   On: 11/20/2014 16:58   Mm Diag Breast Tomo Bilateral  11/20/2014   CLINICAL DATA:  Right  breast lower inner quadrant area of palpable concern, with associated intermittent tenderness, felt 3 months ago.  EXAM: DIGITAL DIAGNOSTIC BILATERAL MAMMOGRAM WITH 3D TOMOSYNTHESIS WITH CAD  ULTRASOUND RIGHT BREAST  COMPARISON:  None.  ACR Breast Density Category b: There are scattered areas of fibroglandular density.  FINDINGS: There is a hyperdense mostly circumscribed mass at the site of palpable concern, in the right breast lower inner quadrant, posterior depth. There is an associated skin thickening overlying the mass. Mammographically, there is also a 5 mm nodule in the right 8:30 o'clock breast, posterior  depth, seen better on the craniocaudal view. Several hyperdense prominent lymph nodes are seen in the right axilla.  There are no suspicious masses areas of architectural distortion or microcalcifications in the left breast.  Mammographic images were processed with CAD.  On physical exam, there is a firm fixed palpable mass in the right 5 o'clock breast, far posterior depth.  Targeted ultrasound is performed, showing corresponding right breast 5 o'clock 10 cm from the nipple hypoechoic lobulated irregular-bordered mass which is superficially located with apparent involvement of the skin. The mass measures 2.1 by 4.1 by 3.5 cm. and demonstrates increased internal vascularity. No sonographic correlation is found for the 8:30 o'clock mammographically seen 5 mm nodule.  Sonographic evaluation of the right axilla demonstrates 3 hypervascular lymph nodes with prominent cortical thickening, measuring less than 1.5 cm in long-axis.  IMPRESSION: Palpable highly suspicious mass in the right 5 o'clock breast, with apparent skin involvement, and associated right axillary lymphadenopathy.  5 mm mammographically seen nodule at 8:30 o'clock, posterior depth, without sonographic correlation.  No evidence of malignancy within the left breast.  RECOMMENDATION: Ultrasound-guided core needle biopsy of the right breast mass and  1 of the right axillary lymph nodes.  MRI of the breast may also be considered, to further evaluate the mammographically seen 8:30 o'clock 5 mm nodule. Alternatively, a 3D guided stereotactic biopsy may be considered.  The patient is scheduled for a ultrasound-guided core needle biopsy on 11/21/2014 at 2:30 o'clock at the [facility].  I have discussed the findings and recommendations with the patient. Results were also provided in writing at the conclusion of the visit. If applicable, a reminder letter will be sent to the patient regarding the next appointment.  BI-RADS CATEGORY  5: Highly suggestive of malignancy.   Electronically Signed   By: Fidela Salisbury M.D.   On: 11/20/2014 16:58   [place] Rt Breast Bx W Loc Dev 1st Lesion Img Bx Spec US Guide  11/21/2014   CLINICAL DATA:  Palpable highly suspicious right 5 o'clock breast mass.  EXAM: ULTRASOUND GUIDED RIGHT BREAST CORE NEEDLE BIOPSY  COMPARISON:  Previous exam(s).  FINDINGS: I met with the patient and we discussed the procedure of ultrasound-guided biopsy, including benefits and alternatives. We discussed the high likelihood of a successful procedure. We discussed the risks of the procedure, including infection, bleeding, tissue injury, clip migration, and inadequate sampling. Informed written consent was given. The usual time-out protocol was performed immediately prior to the procedure.  Using sterile technique and 1% Lidocaine as local anesthetic, under direct ultrasound visualization, a 14 gauge spring-loaded device was used to perform biopsy of right breast mass using a medial approach. At the conclusion of the procedure a metallic tissue marker clip was deployed into the biopsy cavity. Follow up 2 view mammogram was performed and dictated separately.  IMPRESSION: Ultrasound guided biopsy of right breast 5 o'clock mass. No apparent complications.   Electronically Signed   By: Fidela Salisbury M.D.   On: 11/21/2014 16:04   [place] Rt Breast Bx W Loc  Dev Ea Add Lesion Img Bx Spec US Guide  11/21/2014   CLINICAL DATA:  Right axillary lymph node demonstrating cortical thickening, with associated highly suspicious right breast mass.  EXAM: ULTRASOUND GUIDED CORE NEEDLE BIOPSY OF A RIGHT AXILLARY NODE  COMPARISON:  Previous exam(s).  FINDINGS: I met with the patient and we discussed the procedure of ultrasound-guided biopsy, including benefits and alternatives. We discussed the high likelihood of a successful procedure. We discussed the  risks of the procedure, including infection, bleeding, tissue injury, clip migration, and inadequate sampling. Informed written consent was given. The usual time-out protocol was performed immediately prior to the procedure.  Using sterile technique and 1% Lidocaine as local anesthetic, under direct ultrasound visualization, a 14 gauge spring-loaded device was used to perform biopsy of right axillary lymph node using a inferior approach. At the conclusion of the procedure a metallic tissue marker clip was deployed into the biopsy cavity. Follow up 2 view mammogram was performed and dictated separately.  IMPRESSION: Ultrasound guided biopsy of right axillary lymph node. No apparent complications.   Electronically Signed   By: Fidela Salisbury M.D.   On: 11/21/2014 16:07    ASSESSMENT: 39 y.o. [place] woman s/p Right breast biopsy 11/21/2014 for a clinical T2 NX, stage 2 invasive ductal carcinoma, grade 2 or 3, estrogen and progesterone receptor negative, HER-2 equivocal, with an Mib-1 of 20%  (a) biopsy of a suspicious axillary lymph node same day was negative, but discordant  (1) neoadjuvant chemotherapy will consist of cyclophosphamide and doxorubicin in dose dense fashion 4, with onpro support, to be followed by paclitaxel weekly 12  (a) if the patient's tumor proves to be HER-2 positive on review, trastuzumab and pertuzumab will be added with the Taxol treatments  (2) genetics testing pending  (3) definitive  surgery depending on genetics result, to follow chemotherapy  (4) radiation to follow surgery  PLAN: We spent the better part of today's hour-long appointment discussing the biology of breast cancer in general, and the specifics of the patient's tumor in particular. We reviewed the difference between local and systemic therapies, and also the fact that the sequence, whether chemotherapy or surgery is given first, does not affect ultimate survival.  Susan Wilson will undergo genetic testing and the results of that may take several weeks and may affect her surgical choices. For that reason primarily, but also to optimize the cosmetic results and because of the prognostic information provided when a patient achieves a complete pathologic response, we are suggesting she received the chemotherapy before surgery.  Today we talked about the specific agents to be used, and we discussed the possible toxicities, side effects and complications of doxorubicin and cyclophosphamide. She understands that if her tumor proves to be HER-2 positive the anti-HER-2 immunotherapy will be added with the second part of her chemotherapy, namely the weekly paclitaxel treatments.  She will need biopsy of another lymph node since we believe the original lymph node biopsy is discordant. She also needs an MRI to serve as baseline for her neoadjuvant treatment. There is also a second nodule in the right breast that requires biopsy. For all these reasons I don't think it will be possible to start her chemotherapy treatments until August 11. She will see [place] before the start of treatment to discuss supportive therapy.  The patient is aware that she may lose fertility. She and her husband are comfortable with that possibility as they have completed their family.  The patient has a good understanding of the overall plan. She agrees with it. She knows the goal of treatment in her case is cure. She will call with any problems that may develop  before her next visit here.  Chauncey Cruel, MD   12/03/2014 10:58 AM Medical Oncology and Hematology [facility] [address] [place], [place] [address] Tel. [phone]    Fax. [phone]

## 2014-12-03 NOTE — Progress Notes (Signed)
Checked in new pt with no financial concerns prior to seeing the dr.  Informed pt if chemo is part of her treatment we will get auth from her insurance company as well as contact foundations that offer copay assistance for chemo if needed.  She has my card for any billing question or concerns.

## 2014-12-03 NOTE — Patient Instructions (Signed)

## 2014-12-03 NOTE — Progress Notes (Signed)
Clinical Social Work [address] Psychosocial Distress Screening [place]  Patient completed distress screening protocol and scored an 6 on the Psychosocial Distress Thermometer which indicates moderate distress. Clinical Social Worker met with patient and patients family in [facility] to assess for distress and other psychosocial needs. Patient stated that although she was feeling overwhelmed she felt "better" after meeting with the treatment team and getting information on her treatment plan. CSW and patient discussed common feeling and emotions when being diagnosed with cancer, and the importance of support during treatment. CSW informed patient of the support team and support services at [facility].  Patient expressed concern for talking with her children about her diagnosis.  CSw provided patient with information and talking points for facilitating conversations with her children, and coloring book for her 88 year old.   CSW provided contact information and encouraged patient to call with any questions or concerns.  ONCBCN DISTRESS SCREENING 12/03/2014  Screening Type Initial Screening  Distress experienced in past week (1-10) 6  Practical problem type Work/school;Transportation  Emotional problem type Adjusting to illness  Information Concerns Type Lack of info about diagnosis  Physical Problem type Pain  Physician notified of physical symptoms Yes  Referral to clinical psychology No  Referral to clinical social work Yes  Referral to dietition No  Referral to financial advocate No  Referral to support programs Yes  Referral to palliative care No   Johnnye Lana, MSW, LCSW, OSW-C Clinical Social Worker [place] [phone]

## 2014-12-03 NOTE — Telephone Encounter (Signed)
Gave avs & calendar for July thru September

## 2014-12-03 NOTE — Progress Notes (Signed)
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  HISTORY OF PRESENT ILLNESS:Susan Wilson is a 39 y.o. female presenting to clinic in regards to her three month history of a palpable right breast mass in the inframammary fold. She had mammogram and ultrasound, which showed the mass measured 2.1X4.1X3.5cm. She was also noted to have three suspicious appearing lymph nodes in the right axilla. One lymph node was biospyied and discovered to be benign lymphoidal tissue. The nodule discovered in the right breast was not able to be biospied by ultrasound therefore further evaluation with MRI was recommended. A biospy of the primary mass exposed a Grade II-III invasive ductal carcinoma. This was recorded at ER negative, PR negative and HER2 equivocal. The K167 was recorded at 20%. She has done well since her biospy. She has had some drainage at the bx site and using gauze to treat this issue. She was accompanied by her mother, sister, and husband for today's visit. She presenting for my opinion in regards of radiation treatment and management of her disease.  PREVIOUS RADIATION THERAPY: No  Past medical, social and family history were reviewed in the electronic chart. Review of symptoms was reviewed in the electronic chart. Medications were reviewed in the electronic chart.    PHYSICAL EXAM: There were no vitals filed for this visit. Large palpable mass at the inframammary fold of the right breast. Palpable mass into the lower aspect of the right breast with fixation to the chest wall. Large breasts bilaterally. No palpable abnormalities of the left  breast. No palpable adenopathy bilaterally in the cervical, supraclavicular or axillary regions.   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 Possible future treatment options were discussed including PORT MRI, additional biopsies, surgery, chemotherapy, radiation, and genetics counseling.  PLAN: I spoke to the patient today regarding her diagnosis and options for treatment. We discussed the equivalence in terms of survival and local failure between mastectomy and breast conservation. We discussed the role of radiation in decreasing local failures in patients who undergo mastectomy and have positive lymph nodes or tumors greater than 5 cm. We discussed the role of radiation and decreasing local failures in patients who undergo lumpectomy.We discussed the possible side effects including but not limited to asymptomatic rib, heart and lung damage, heart disease, skin redness, fatigue, permanent skin darkening, and chest wall swelling. We discussed increased complications that can occur with reconstruction after radiation. We discussed the process of simulation and the placement tattoos. We discussed the low likelihood of secondary malignancies.  She is leaning towards breast conservation. She will need genetic testing due to her age, as well as an MRI to investigate the lymph nodes as well as this other nodule in her right breast. The radiologist also recommended another biopsy of the lymph nodes. If this biopsy is negative for the second time, she will have a sentinel lymph node biopsy at the end treatment and would not require postmastectomy radiation if she chose to have a mstectomy. If the second biopsy is positive she will require postmastectomy radiation treatment with a four  field, if she chooses breast conservation or enrollment on one of the neoadjuvant lymph node trials.  I spent 40 minutes  face  to face with the patient and more than 50% of that time was spent in counseling and/or coordination of care.  This document serves as a record of services personally performed by Susan Wilson , MD. It was created on her behalf by Lenn Cal, a trained medical scribe. The creation of this record is based on the scribe's personal observations and the provider's statements to them. This document has been checked and approved by the attending provider.    ------------------------------------------------  Susan Silversmith, MD

## 2014-12-03 NOTE — Progress Notes (Signed)
Subjective:     Patient ID: Susan Wilson, female   DOB: November 16, 1975, 39 y.o.   MRN: [id number]  HPI   Review of Systems     Objective:   Physical Exam For the patient to understand and be given the tools to implement a healthy plant based diet during their cancer diagnosis.     Assessment:     Patient was seen today and found to be ready to go and accompanied by her sister, mom, and husband. Pt s ht 5'5'' wt 220 and BMI 36.8. Pt was listening and the pts husband had many great questions. Pts mother talked over the dietitian and hinder the appointment . Pts sister states she will try to get the pt to broaden her horizons as far as her diet is concerned.      Plan:     Dietitian educated the patient on implementing a plant based diet by incorporating more plant proteins, fruits, and vegetables. As a part of a healthy routine physical activity was discussed. The importance of legitimate, evidence based information was discussed and examples were given. A folder of evidence based information with a focus on a plant based diet and general nutrition during cancer was given to the patient.  As a part of the continuum of care the cancer dietitian's contact information was given to the patient in the event they would like to have a follow up appointment.

## 2014-12-03 NOTE — Progress Notes (Signed)
Ms. Picone is a very pleasant 39 y.o. female from [place], [place] with newly diagnosed grade 2-3 carcinoma of the right breast.  Biopsy results revealed the tumor's prognostic profile is ER negative, PR negative, and HER2/neu is equivocal. Ki67 is 20%.  She presents today with her family to the [facility]) for treatment consideration and recommendations from the breast surgeon, radiation oncologist, and medical oncologist.     I briefly met with Ms. Basham-Wilson and her family during her [facility] visit today. We discussed the purpose of the Survivorship Clinic, which will include monitoring for recurrence, coordinating completion of age and gender-appropriate cancer screenings, promotion of overall wellness, as well as managing potential late/long-term side effects of anti-cancer treatments.    The treatment plan for Ms. Razon-Wilson will likely include neoadjuvant chemotherapy, surgery, and radiation therapy.  She will meet with the [employer] as well due to her age at diagnosis. As of today, the intent of treatment for Ms. Sangha-Wilson is cure, therefore she will be eligible for the Survivorship Clinic upon her completion of treatment.  Her survivorship care plan (SCP) document will be drafted and updated throughout the course of her treatment trajectory. She will receive the SCP in an office visit with myself in the Survivorship Clinic once she has completed treatment.   Ms. Lahman was encouraged to ask questions and all questions were answered to her satisfaction.  She was given my business card and encouraged to contact me with any concerns regarding survivorship.  I look forward to participating in her care.   Mike Craze, NP [place] [phone]

## 2014-12-03 NOTE — Telephone Encounter (Signed)
Left message to confirm Echo 07/26

## 2014-12-03 NOTE — Therapy (Signed)
[place], [place], [address] Phone: [phone]   Fax:  [phone]  Physical Therapy Evaluation  Patient Details  Name: Susan Wilson MRN: [id number] Date of Birth: 12/22/1975 Referring Provider:  Rolm Bookbinder, MD  Encounter Date: 12/03/2014      PT End of Session - 12/03/14 1443    Visit Number 1   Number of Visits 1   PT Start Time 1050   PT Stop Time 1115   PT Time Calculation (min) 25 min   Activity Tolerance Patient tolerated treatment well   Behavior During Therapy [facility] for tasks assessed/performed      Past Medical History  Diagnosis Date  . 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 Breast cancer   . Hernia, umbilical   . Back pain   . Headache     History reviewed. No pertinent past surgical history.  There were no vitals filed for this visit.  Visit Diagnosis:  Carcinoma of lower-inner quadrant of right breast - Plan: PT plan of care cert/re-cert  Abnormal posture - Plan: PT plan of care cert/re-cert      Subjective Assessment - 12/03/14 1434    Subjective Patient was seen today for a baseline assessment of her newly diagnosed right breast cancer.   Pertinent History Patient was diagnosed on 11/24/14 with right lower inner quadrant breast cancer.  It is invasive ductal carcinoma, ER/PR negative, and HER2 equivocal.  Ki67 is 20%.  She also has a 5cm area that needs to be biopsied.   Patient Stated Goals Reduce lymphedema risk and learn post op shoulder ROM HEP   Currently in Pain? No/denies  She reports intermittent low back pain but none right now            [facility] PT Assessment - 12/03/14 0001    Assessment   Medical Diagnosis right breast cancer   Onset Date/Surgical Date 11/24/14   Hand Dominance Right   Prior Therapy none   Precautions   Precautions Other (comment)  Active breast cancer   Restrictions   Weight  Bearing Restrictions No   Balance Screen   Has the patient fallen in the past 6 months No   Has the patient had a decrease in activity level because of a fear of falling?  No   Is the patient reluctant to leave their home because of a fear of falling?  No   Home Social worker Private residence   Living Arrangements Spouse/significant other;Children  Husband, 7 and 3 y.o. children   Available Help at Discharge Family   Prior Function   Level of Independence Independent   Vocation Full time employment   Vocation Requirements Works with [employer] on computer and phone most of the time   Leisure She walks 2x/wk for 30 min   Cognition   Overall Cognitive Status Within Functional Limits for tasks assessed   Posture/Postural Control   Posture/Postural Control Postural limitations   Postural Limitations Forward head;Rounded Shoulders   ROM / Strength   AROM / PROM / Strength AROM;Strength   AROM   AROM Assessment Site Shoulder   Right/Left Shoulder Right;Left   Right Shoulder Extension 35 Degrees   Right Shoulder Flexion 150 Degrees   Right Shoulder ABduction 160 Degrees   Right Shoulder Internal Rotation 67 Degrees   Right Shoulder External Rotation 82 Degrees   Left Shoulder Extension 45 Degrees   Left Shoulder Flexion 147 Degrees  Left Shoulder ABduction 151 Degrees   Left Shoulder Internal Rotation 60 Degrees   Left Shoulder External Rotation 87 Degrees   Strength   Overall Strength Within functional limits for tasks performed           LYMPHEDEMA/ONCOLOGY QUESTIONNAIRE - 12/03/14 1441    Type   Cancer Type right breast cancer   Lymphedema Assessments   Lymphedema Assessments Upper extremities   Right Upper Extremity Lymphedema   10 cm Proximal to Olecranon Process 36.5 cm   Olecranon Process 28.2 cm   10 cm Proximal to Ulnar Styloid Process 26 cm   Just Proximal to Ulnar Styloid Process 17.4 cm   Across Hand at [employer] 19.4 cm   At [place] of 2nd  Digit 6.5 cm   Left Upper Extremity Lymphedema   10 cm Proximal to Olecranon Process 36 cm   Olecranon Process 28.5 cm   10 cm Proximal to Ulnar Styloid Process 24.9 cm   Just Proximal to Ulnar Styloid Process 17.9 cm   Across Hand at [employer] 19.1 cm   At [place] of 2nd Digit 6.2 cm       Patient was instructed today in a home exercise program today for post op shoulder range of motion. These included active assist shoulder flexion in sitting, scapular retraction, wall walking with shoulder abduction, and hands behind head external rotation.  She was encouraged to do these twice a day, holding 3 seconds and repeating 5 times when permitted by her physician.         PT Education - 12/03/14 1442    Education provided Yes   Education Details Lymphedema risk reduction and post op shoulder ROM HEP   Person(s) Educated Patient;Spouse   Methods Explanation;Demonstration;Handout   Comprehension Verbalized understanding;Returned demonstration              Breast Clinic Goals - 12/03/14 1519    Patient will be able to verbalize understanding of pertinent lymphedema risk reduction practices relevant to her diagnosis specifically related to skin care.   Time 1   Period Days   Status Achieved   Patient will be able to return demonstrate and/or verbalize understanding of the post-op home exercise program related to regaining shoulder range of motion.   Time 1   Period Days   Status Achieved   Patient will be able to verbalize understanding of the importance of attending the postoperative After Breast Cancer Class for further lymphedema risk reduction education and therapeutic exercise.   Time 1   Period Days   Status Achieved              Plan - 12/03/14 1444    Clinical Impression Statement Patient was diagnosed on 11/24/14 with right lower inner quadrant breast cancer.  It is invasive ductal carcinoma, ER/PR negative, and HER2 equivocal.  Ki67 is 20%.  She also has a  5cm area that needs to be biopsied.  She is planning to have genetic testing, a breast MRI, neoadjuvant chemotherapy, a right lumpectomy with a sentinel node biopsy followed by radiation.  She will benefit from post op PT to regain shoulder ROM and strength and reduce lymphedema risk.   Pt will benefit from skilled therapeutic intervention in order to improve on the following deficits Decreased range of motion;Impaired UE functional use;Pain;Decreased knowledge of precautions;Decreased strength   Rehab Potential Excellent   Clinical Impairments Affecting Rehab Potential none   PT Frequency One time visit   PT Treatment/Interventions Patient/family  education;Therapeutic exercise   Consulted and Agree with Plan of Care Patient;Family member/caregiver   Family Member Consulted Husband, mother, sister     Patient will follow up at outpatient cancer rehab if needed following surgery.  If the patient requires physical therapy at that time, a specific plan will be dictated and sent to the referring physician for approval. The patient was educated today on appropriate basic range of motion exercises to begin post operatively and the importance of attending the After Breast Cancer class following surgery.  Patient was educated today on lymphedema risk reduction practices as it pertains to recommendations that will benefit the patient immediately following surgery.  She verbalized good understanding.  No additional physical therapy is indicated at this time.       Problem List Patient Active Problem List   Diagnosis Date Noted  . 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 Migraine 11/19/2014    Annia Friendly, PT 12/03/2014, 3:20 PM  [place], [place], [address] Phone: [phone]   Fax:  [phone]

## 2014-12-05 ENCOUNTER — Telehealth: Payer: Self-pay | Admitting: *Deleted

## 2014-12-05 ENCOUNTER — Ambulatory Visit
Admission: RE | Admit: 2014-12-05 | Discharge: 2014-12-05 | Disposition: A | Discharge status: Home / Self Care | Payer: 59 | Source: Ambulatory Visit | Attending: General Surgery | Admitting: General Surgery

## 2014-12-05 DIAGNOSIS — C50311 Malignant neoplasm of lower-inner quadrant of right female breast: Secondary | ICD-10-CM

## 2014-12-05 MED ORDER — GADOBENATE DIMEGLUMINE 529 MG/ML IV SOLN
20.0000 mL | Freq: Once | INTRAVENOUS | Status: AC | PRN
  Administered 2014-12-05: 20 mL via INTRAVENOUS

## 2014-12-05 NOTE — Telephone Encounter (Signed)
Spoke to pt concerning [place] from 12/03/14. Denies questions or concerns regarding dx or treatment care plan. Pt request for 1st chemo to fall on 01/01/15 d/t her husbands work schedule in working every other weekend would be more helpful for her and her family rather than starting on 12/25/14. POF placed and physician team notified of change. Encourage pt to call with needs. Received verbal understanding. Contact information given.

## 2014-12-06 ENCOUNTER — Other Ambulatory Visit: Payer: 59

## 2014-12-08 ENCOUNTER — Encounter: Payer: Self-pay | Admitting: Oncology

## 2014-12-08 ENCOUNTER — Encounter: Payer: Self-pay | Admitting: *Deleted

## 2014-12-08 ENCOUNTER — Telehealth: Payer: Self-pay | Admitting: General Practice

## 2014-12-08 ENCOUNTER — Other Ambulatory Visit: Payer: 59

## 2014-12-08 ENCOUNTER — Telehealth: Payer: Self-pay | Admitting: Oncology

## 2014-12-08 ENCOUNTER — Other Ambulatory Visit: Payer: Self-pay

## 2014-12-08 DIAGNOSIS — R599 Enlarged lymph nodes, unspecified: Secondary | ICD-10-CM

## 2014-12-08 DIAGNOSIS — N6310 Unspecified lump in the right breast, unspecified quadrant: Secondary | ICD-10-CM

## 2014-12-08 DIAGNOSIS — N6320 Unspecified lump in the left breast, unspecified quadrant: Secondary | ICD-10-CM

## 2014-12-08 NOTE — Telephone Encounter (Signed)
Appointments made and changed chemo per pof  And patient will get a new schedule 7/26

## 2014-12-08 NOTE — Progress Notes (Signed)
I placed form from [place] on desk of nurse of dr. Jana Hakim

## 2014-12-09 ENCOUNTER — Encounter: Payer: Self-pay | Admitting: *Deleted

## 2014-12-09 ENCOUNTER — Encounter: Payer: Self-pay | Admitting: Oncology

## 2014-12-09 ENCOUNTER — Ambulatory Visit (HOSPITAL_COMMUNITY)
Admission: RE | Admit: 2014-12-09 | Discharge: 2014-12-09 | Disposition: A | Discharge status: Home / Self Care | Payer: 59 | Source: Ambulatory Visit | Attending: Oncology | Admitting: Oncology

## 2014-12-09 ENCOUNTER — Other Ambulatory Visit: Payer: Self-pay | Admitting: Oncology

## 2014-12-09 ENCOUNTER — Other Ambulatory Visit: Payer: 59

## 2014-12-09 DIAGNOSIS — C50311 Malignant neoplasm of lower-inner quadrant of right female breast: Secondary | ICD-10-CM | POA: Diagnosis not present

## 2014-12-09 DIAGNOSIS — I340 Nonrheumatic mitral (valve) insufficiency: Secondary | ICD-10-CM | POA: Diagnosis not present

## 2014-12-09 DIAGNOSIS — I351 Nonrheumatic aortic (valve) insufficiency: Secondary | ICD-10-CM | POA: Diagnosis not present

## 2014-12-09 NOTE — Progress Notes (Signed)
  Echocardiogram 2D Echocardiogram has been performed.  Susan Wilson 12/09/2014, 2:47 PM

## 2014-12-09 NOTE — Progress Notes (Signed)
I faxed [place] fmla forms [phone] and make copy for patient's records.

## 2014-12-11 ENCOUNTER — Other Ambulatory Visit: Payer: 59

## 2014-12-11 NOTE — Telephone Encounter (Signed)
-----   Message from [name], RN sent at 12/05/2014 10:39 AM EDT ----- Regarding: [place] Team,  Ms Redmond Pulling was seen in clinic on 7/20. She is scheduled for the following.  Breast MRI 7/22 Echo and Chemo class 7/26 Genetics 8/1 Port 8/2 F/u with Nira Conn 8/8 1st Chemo 8/18- pt request instead of 8/11 d/t her husbands work schedule. Please let me know if you have questions.  Thanks, [employer]

## 2014-12-12 ENCOUNTER — Ambulatory Visit: Admission: RE | Admit: 2014-12-12 | Payer: 59 | Source: Ambulatory Visit

## 2014-12-12 ENCOUNTER — Telehealth: Payer: Self-pay | Admitting: *Deleted

## 2014-12-12 ENCOUNTER — Encounter (HOSPITAL_COMMUNITY)
Admission: RE | Admit: 2014-12-12 | Discharge: 2014-12-12 | Disposition: A | Discharge status: Home / Self Care | Payer: 59 | Source: Ambulatory Visit | Attending: General Surgery | Admitting: General Surgery

## 2014-12-12 ENCOUNTER — Ambulatory Visit
Admission: RE | Admit: 2014-12-12 | Discharge: 2014-12-12 | Disposition: A | Discharge status: Home / Self Care | Payer: 59 | Source: Ambulatory Visit | Attending: General Surgery | Admitting: General Surgery

## 2014-12-12 ENCOUNTER — Encounter (HOSPITAL_COMMUNITY): Payer: Self-pay

## 2014-12-12 DIAGNOSIS — N6320 Unspecified lump in the left breast, unspecified quadrant: Secondary | ICD-10-CM

## 2014-12-12 DIAGNOSIS — C50911 Malignant neoplasm of unspecified site of right female breast: Secondary | ICD-10-CM | POA: Diagnosis not present

## 2014-12-12 DIAGNOSIS — Z01812 Encounter for preprocedural laboratory examination: Secondary | ICD-10-CM | POA: Diagnosis not present

## 2014-12-12 HISTORY — DX: Anemia, unspecified: D64.9

## 2014-12-12 LAB — BASIC METABOLIC PANEL
ANION GAP: 9 (ref 5–15)
BUN: 14 mg/dL (ref 6–20)
CALCIUM: 9.3 mg/dL (ref 8.9–10.3)
CO2: 25 mmol/L (ref 22–32)
Chloride: 102 mmol/L (ref 101–111)
Creatinine, Ser: 0.69 mg/dL (ref 0.44–1.00)
GFR calc non Af Amer: >60 mL/min (ref >60)
Glucose, Bld: 98 mg/dL (ref 65–99)
POTASSIUM: 4.2 mmol/L (ref 3.5–5.1)
Sodium: 136 mmol/L (ref 135–145)

## 2014-12-12 LAB — CBC
HCT: 36 % (ref 36.0–46.0)
Hemoglobin: 11.3 g/dL — ABNORMAL LOW (ref 12.0–15.0)
MCH: 24.5 pg — ABNORMAL LOW (ref 26.0–34.0)
MCHC: 31.4 g/dL (ref 30.0–36.0)
MCV: 77.9 fL — ABNORMAL LOW (ref 78.0–100.0)
Platelets: 348 10*3/uL (ref 150–400)
RBC: 4.62 MIL/uL (ref 3.87–5.11)
RDW: 14.5 % (ref 11.5–15.5)
WBC: 4.5 10*3/uL (ref 4.0–10.5)

## 2014-12-12 LAB — HCG, SERUM, QUALITATIVE: Preg, Serum: NEGATIVE

## 2014-12-12 NOTE — Progress Notes (Signed)
PCP is Dr Kennon Rounds Denies seeing a cardiologist Echo noted in epic from 12-09-14 Denies ever having a card cath or stress test

## 2014-12-12 NOTE — Telephone Encounter (Signed)
-----   Message from [name], RN sent at 12/05/2014 10:39 AM EDT ----- Regarding: [place] Team,  Ms Redmond Pulling was seen in clinic on 7/20. She is scheduled for the following.  Breast MRI 7/22 Echo and Chemo class 7/26 Genetics 8/1 Port 8/2 F/u with Nira Conn 8/8 1st Chemo 8/18- pt request instead of 8/11 d/t her husbands work schedule. Please let me know if you have questions.  Thanks, [employer]

## 2014-12-12 NOTE — Pre-Procedure Instructions (Signed)
Susan Wilson  12/12/2014      [facility], [place] [address] Phone: [phone] Fax: [phone]    Your procedure is scheduled on Aug 2.  Report to [facility] Admitting at 530 A.M.  Call this number if you have problems the morning of surgery:  [phone]   Remember:  Do not eat food or drink liquids after midnight.  Take these medicines the morning of surgery with A SIP OF WATER:if needed cyclobenzaprine (Flexeril) and/or tramadol (Ultram)  Stop taking Aspirin, Aleve, Ibuprofen, BC's, Goody's, herbal medications, Fish Oil, naproxen   Do not wear jewelry, make-up or nail polish.  Do not wear lotions, powders, or perfumes.  You may wear deodorant.  Do not shave 48 hours prior to surgery.  Men may shave face and neck.  Do not bring valuables to the hospital.  [facility] is not responsible for any belongings or valuables.  Contacts, dentures or bridgework may not be worn into surgery.  Leave your suitcase in the car.  After surgery it may be brought to your room.  For patients admitted to the hospital, discharge time will be determined by your treatment team.  Patients discharged the day of surgery will not be allowed to drive home.    Special instructions:  [facility] - Preparing for Surgery  Before surgery, you can play an important role.  Because skin is not sterile, your skin needs to be as free of germs as possible.  You can reduce the number of germs on you skin by washing with CHG (chlorahexidine gluconate) soap before surgery.  CHG is an antiseptic cleaner which kills germs and bonds with the skin to continue killing germs even after washing.  Please DO NOT use if you have an allergy to CHG or antibacterial soaps.  If your skin becomes reddened/irritated stop using the CHG and inform your nurse when you arrive at Short Stay.  Do not shave  (including legs and underarms) for at least 48 hours prior to the first CHG shower.  You may shave your face.  Please follow these instructions carefully:   1.  Shower with CHG Soap the night before surgery and the   morning of Surgery.  2.  If you choose to wash your hair, wash your hair first as usual with your  normal shampoo.  3.  After you shampoo, rinse your hair and body thoroughly to remove the Shampoo.  4.  Use CHG as you would any other liquid soap.  You can apply chg directly  to the skin and wash gently with scrungie or a clean washcloth.  5.  Apply the CHG Soap to your body ONLY FROM THE NECK DOWN.  Do not use on open wounds or open sores.  Avoid contact with your eyes,  ears, mouth and genitals (private parts).  Wash genitals (private parts) with your normal soap.  6.  Wash thoroughly, paying special attention to the area where your surgery will be performed.  7.  Thoroughly rinse your body with warm water from the neck down.  8.  DO NOT shower/wash with your normal soap after using and rinsing off  the CHG Soap.  9.  Pat yourself dry with a clean towel.            10.  Wear clean pajamas.            11.  Place clean sheets on your bed the night of your first shower and do not sleep with pets.  Day of Surgery  Do not apply any lotions/deoderants the morning of surgery.  Please wear clean clothes to the hospital/surgery center.     Please read over the following fact sheets that you were given. Pain Booklet, Coughing and Deep Breathing and Surgical Site Infection Prevention

## 2014-12-15 ENCOUNTER — Other Ambulatory Visit: Payer: 59

## 2014-12-15 ENCOUNTER — Encounter: Payer: Self-pay | Admitting: Genetic Counselor

## 2014-12-15 ENCOUNTER — Ambulatory Visit (HOSPITAL_BASED_OUTPATIENT_CLINIC_OR_DEPARTMENT_OTHER): Payer: 59 | Admitting: Genetic Counselor

## 2014-12-15 ENCOUNTER — Encounter: Payer: Self-pay | Admitting: *Deleted

## 2014-12-15 DIAGNOSIS — Z800 Family history of malignant neoplasm of digestive organs: Secondary | ICD-10-CM | POA: Diagnosis not present

## 2014-12-15 DIAGNOSIS — C50311 Malignant neoplasm of lower-inner quadrant of right female breast: Secondary | ICD-10-CM | POA: Diagnosis not present

## 2014-12-15 DIAGNOSIS — Z809 Family history of malignant neoplasm, unspecified: Secondary | ICD-10-CM | POA: Diagnosis not present

## 2014-12-15 DIAGNOSIS — Z803 Family history of malignant neoplasm of breast: Secondary | ICD-10-CM | POA: Insufficient documentation

## 2014-12-15 DIAGNOSIS — Z315 Encounter for genetic counseling: Secondary | ICD-10-CM

## 2014-12-15 MED ORDER — CEFAZOLIN SODIUM-DEXTROSE 2-3 GM-% IV SOLR
2.0000 g | INTRAVENOUS | Status: AC
  Administered 2014-12-16: 2 g via INTRAVENOUS

## 2014-12-15 NOTE — Progress Notes (Signed)
REFERRING PROVIDER: Donnamae Jude, MD [place], [place] [address]   Lurline Del, MD  PRIMARY PROVIDER:  Donnamae Jude, MD  PRIMARY REASON FOR VISIT:  1. Breast cancer of lower-inner quadrant of right female breast   2. Family history of breast cancer      HISTORY OF PRESENT ILLNESS:   Susan Wilson, a 39 y.o. female, was seen for a [facility] cancer genetics consultation at the request of Dr. Kennon Rounds due to a personal and family history of cancer.  Susan Wilson presents to clinic today to discuss the possibility of a hereditary predisposition to cancer, genetic testing, and to further clarify her future cancer risks, as well as potential cancer risks for family members.   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 This will be treated with chemotherapy, surgery, and radiation.  She will receive her port tomorrow.  CANCER HISTORY:    Breast cancer of lower-inner quadrant of right female breast   11/20/2014 Breast US 5 mm mammographically seen nodule at 8:30 o'clock, posterior depth, without sonographic correlation.   11/20/2014 Breast US Palpable highly suspicious mass in the right 5 o'clock breast, with apparent skin involvement, and associated right axillary lymphadenopathy.     11/21/2014 Initial Biopsy Breast, right, needle core biopsy, 5 o'clock mass - INVASIVE DUCTAL CARCINOMA, SEE COMMENT. 2. Lymph node, needle/core biopsy - ONE LYMPH NODE, NEGATIVE FOR TUMOR (0/1), SEE COMMENT   11/21/2014 Receptors her2 Estrogen Receptor: 0%, NEGATIVE (PERFORMED MANUALLY) Progesterone Receptor: 0%, NEGATIVE (PERFORMED MANUALLY) Proliferation Marker Ki67: 20% (PERFORMED MANUALLY)  HER2 - *EQUIVOCAL* OF NOTE, 40 TUMOR CELLS WERE EVALUATED.   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  First live birth at age 39.  OCP use for approximately 8 years.  Ovaries intact: yes.  Hysterectomy: no.  Menopausal status: premenopausal.  HRT use: 0 years. Colonoscopy: no; not examined. Mammogram within the last year: yes. Number of breast biopsies: 1. Up to date with pelvic exams:  yes. Any excessive radiation exposure in the past:  no  Past Medical History  Diagnosis Date  . 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 Breast cancer   . Hernia, umbilical   . Back pain   . Headache   . Anemia   . Hernia, umbilical   . Family history of breast cancer     History reviewed. No pertinent past surgical history.  History   Social History  . Marital Status: Married    Spouse Name: N/A  . Number of Children: 2  . Years of Education: N/A   Social History Main Topics  . Smoking status: Never Smoker   . Smokeless tobacco: Never Used  . Alcohol Use: Yes     Comment: rarely  . Drug Use: No  . Sexual Activity: Yes    Birth Control/ Protection: Condom   Other Topics Concern  . None   Social History Narrative     FAMILY HISTORY:  We obtained a detailed, 4-generation family history.  Significant diagnoses are listed below: Family History  Problem Relation Age of Onset  . Mesothelioma Maternal Grandfather     asbestos exposure, died in his 13s  . Heart defect Sister 0  . Heart disease Paternal Aunt   . Heart disease  Paternal Uncle   . Breast cancer Paternal Grandmother   . Diabetes Paternal Grandmother   . Cancer Paternal Grandfather     NOS  . Colon cancer Other 24    MGMs brother with colon cancer  . Cancer Other     several of MGF's sisters with cancer NOS  . Liver cancer Other     MGF's brother   The patient found a mass in her right breast and was seen in the ER based on it being tender.  She has two children and one living sister, none of whom have cancer.  Her parents are both living.  Her mother is 54 and had a TAH-BSO  in her 106s.  Her mother has five sisters who are cancer free.  Her maternal grandmother had a benign breast lump removed in her 74s, and her grandfather died from mesothelioma.  He had several sisters who died of cancer NOS and a brother with liver cancer.  Her grandmother had a brother who died of colon cancer.  The patient's father is alive at 30.  He had five sisters and four brothers.  Some had heart disease but none had cancer.  His mother was diagnosed with breast cancer at an unknown age.  His father had cancer NOS.  Patient's maternal ancestors are of African American descent, and paternal ancestors are of African American descent. There is no reported Ashkenazi Jewish ancestry. There is no known consanguinity.  GENETIC COUNSELING ASSESSMENT: Susan Wilson is a 39 y.o. female with a personal history of triple negative breast cancer which somewhat suggestive of a hereditary cancer syndrome and predisposition to cancer. We, therefore, discussed and recommended the following at today's visit.   DISCUSSION: We discussed the most common reason for an early onset triple negative breast cancer is due to BRCA mutations.  We also have seen this presentation with PALB2 mutations.  Overall, we reviewed hereditary cancer syndromes and multiple gene panels.  She will get her port tomorrow and start chemotherapy.  Her surgery will be determine by her genetic testing results and response to chemotherapy.  We reviewed the characteristics, features and inheritance patterns of hereditary cancer syndromes. We also discussed genetic testing, including the appropriate family members to test, the process of testing, insurance coverage and turn-around-time for results. We discussed the implications of a negative, positive and/or variant of uncertain significant result. We recommended Susan Wilson pursue genetic testing for the Breast/Ovarian cancer gene panel. The Breast/Ovarian gene panel offered by [facility]  includes sequencing and rearrangement analysis for the following 21 genes:  ATM, BARD1, BRCA1, BRCA2, BRIP1, CDH1, CHEK2, EPCAM, FANCC, MLH1, MSH2, MSH6, NBN, PALB2, PMS2, PTEN, RAD51C, RAD51D, STK11, TP53, and XRCC2.     Based on Susan Wilson's personal and family history of cancer, she meets medical criteria for genetic testing. Despite that she meets criteria, she may still have an out of pocket cost. We discussed that if her out of pocket cost for testing is over $100, the laboratory will call and confirm whether she wants to proceed with testing.  If the out of pocket cost of testing is less than $100 she will be billed by the genetic testing laboratory.   PLAN: After considering the risks, benefits, and limitations, Susan Wilson  provided informed consent to pursue genetic testing and the blood sample was sent to [facility] for analysis of the Breast/Ovarian cancer genet panel. Results should be available within approximately 2-3 weeks' time, at which point they will be disclosed  by telephone to Susan Wilson, as will any additional recommendations warranted by these results. Susan Wilson will receive a summary of her genetic counseling visit and a copy of her results once available. This information will also be available in Epic. We encouraged Susan Wilson to remain in contact with cancer genetics annually so that we can continuously update the family history and inform her of any changes in cancer genetics and testing that may be of benefit for her family. Susan Wilson's questions were answered to her satisfaction today. Our contact information was provided should additional questions or concerns arise.  Lastly, we encouraged Susan Wilson-Wilson to remain in contact with cancer genetics annually so that we can continuously update the family history and inform her of any changes in cancer genetics and testing that may be of benefit for this family.   Ms.   Knabe-Wilson's questions were answered to her satisfaction today. Our contact information was provided should additional questions or concerns arise. Thank you for the referral and allowing [place] to share in the care of your patient.   Karen P. Florene Glen, [place], [facility] Certified Genetic Counselor [email or web address] phone: [phone]  The patient was seen for a total of 45 minutes in face-to-face genetic counseling.  This patient was discussed with Drs. Magrinat, Lindi Adie and/or Burr Medico who agrees with the above.    _______________________________________________________________________ For Office Staff:  Number of people involved in session: 1 Was an Intern/ student involved with case: no

## 2014-12-15 NOTE — Progress Notes (Signed)
Anesthesia Chart Review: Patient is a 39 year old female scheduled for Port-a-cath insertion on 12/16/14 by Dr. Donne Hazel.  History includes breast cancer (right) 11/2014, non-smoker, umbilical hernia, anemia. BMI is consistent with obesity.   10/20/14 EKG: NSR, low voltage QRS, cannot rule out anterior infarct (age undetermined). No CV symptoms documented at her PAT visit.  12/09/14 Echo: Study Conclusions - Left ventricle: The cavity size was normal. Wall thickness was normal. Systolic function was normal. The estimated ejection fraction was in the range of 55% to 60%. - Aortic valve: There was mild regurgitation. - Mitral valve: Some cardiac beats MR looked more moderate but poorly defined. There was mild regurgitation. - Left atrium: The atrium was mildly dilated. - Atrial septum: No defect or patent foramen ovale was identified.  10/20/14 CXR: IMPRESSION: Negative chest.  Preoperative labs noted.   If no acute changes then I anticipate that she can proceed as planned.  George [facility] Short Stay Center/Anesthesiology Phone [phone] 12/15/2014 9:21 AM

## 2014-12-16 ENCOUNTER — Ambulatory Visit (HOSPITAL_COMMUNITY): Payer: 59

## 2014-12-16 ENCOUNTER — Ambulatory Visit (HOSPITAL_COMMUNITY): Payer: 59 | Admitting: Anesthesiology

## 2014-12-16 ENCOUNTER — Encounter (HOSPITAL_COMMUNITY): Payer: Self-pay | Admitting: *Deleted

## 2014-12-16 ENCOUNTER — Ambulatory Visit (HOSPITAL_COMMUNITY)
Admission: RE | Admit: 2014-12-16 | Discharge: 2014-12-16 | Disposition: A | Discharge status: Home / Self Care | Payer: 59 | Source: Ambulatory Visit | Attending: General Surgery | Admitting: General Surgery

## 2014-12-16 ENCOUNTER — Encounter (HOSPITAL_COMMUNITY)
Admission: RE | Disposition: A | Discharge status: Home / Self Care | Payer: Self-pay | Source: Ambulatory Visit | Attending: General Surgery

## 2014-12-16 DIAGNOSIS — C50311 Malignant neoplasm of lower-inner quadrant of right female breast: Secondary | ICD-10-CM | POA: Diagnosis not present

## 2014-12-16 DIAGNOSIS — Z95828 Presence of other vascular implants and grafts: Secondary | ICD-10-CM

## 2014-12-16 HISTORY — PX: PORTACATH PLACEMENT: SHX2246

## 2014-12-16 SURGERY — INSERTION, TUNNELED CENTRAL VENOUS DEVICE, WITH PORT
Anesthesia: General | Site: Chest | Laterality: Right

## 2014-12-16 MED ORDER — MORPHINE SULFATE 2 MG/ML IJ SOLN: 2.0000 mg | INTRAMUSCULAR | Status: DC | PRN

## 2014-12-16 MED ORDER — ONDANSETRON HCL 4 MG/2ML IJ SOLN
INTRAMUSCULAR | Status: DC | PRN
  Administered 2014-12-16: 4 mg via INTRAVENOUS

## 2014-12-16 MED ORDER — SODIUM CHLORIDE 0.9 % IV SOLN: INTRAVENOUS | Status: DC

## 2014-12-16 MED ORDER — MIDAZOLAM HCL 2 MG/2ML IJ SOLN
INTRAMUSCULAR | Status: AC
  Filled 2014-12-16: qty 4

## 2014-12-16 MED ORDER — LIDOCAINE HCL (CARDIAC) 20 MG/ML IV SOLN
INTRAVENOUS | Status: DC | PRN
  Administered 2014-12-16: 50 mg via INTRAVENOUS

## 2014-12-16 MED ORDER — DEXAMETHASONE SODIUM PHOSPHATE 10 MG/ML IJ SOLN
INTRAMUSCULAR | Status: AC
  Filled 2014-12-16: qty 1

## 2014-12-16 MED ORDER — PROMETHAZINE HCL 25 MG/ML IJ SOLN: 6.2500 mg | INTRAMUSCULAR | Status: DC | PRN

## 2014-12-16 MED ORDER — SODIUM CHLORIDE 0.9 % IV SOLN: 250.0000 mL | INTRAVENOUS | Status: DC | PRN

## 2014-12-16 MED ORDER — PROPOFOL 10 MG/ML IV BOLUS
INTRAVENOUS | Status: DC | PRN
  Administered 2014-12-16: 200 mg via INTRAVENOUS

## 2014-12-16 MED ORDER — FENTANYL CITRATE (PF) 250 MCG/5ML IJ SOLN
INTRAMUSCULAR | Status: AC
  Filled 2014-12-16: qty 5

## 2014-12-16 MED ORDER — SODIUM CHLORIDE 0.9 % IJ SOLN: 3.0000 mL | Freq: Two times a day (BID) | INTRAMUSCULAR | Status: DC

## 2014-12-16 MED ORDER — HEPARIN SOD (PORK) LOCK FLUSH 100 UNIT/ML IV SOLN
INTRAVENOUS | Status: DC | PRN
  Administered 2014-12-16: 400 [IU] via INTRAVENOUS

## 2014-12-16 MED ORDER — DEXAMETHASONE SODIUM PHOSPHATE 10 MG/ML IJ SOLN
INTRAMUSCULAR | Status: DC | PRN
  Administered 2014-12-16: 10 mg via INTRAVENOUS

## 2014-12-16 MED ORDER — HYDROMORPHONE HCL 1 MG/ML IJ SOLN: 0.2500 mg | INTRAMUSCULAR | Status: DC | PRN

## 2014-12-16 MED ORDER — MIDAZOLAM HCL 5 MG/5ML IJ SOLN
INTRAMUSCULAR | Status: DC | PRN
  Administered 2014-12-16: 2 mg via INTRAVENOUS

## 2014-12-16 MED ORDER — BUPIVACAINE HCL (PF) 0.25 % IJ SOLN
INTRAMUSCULAR | Status: AC
  Filled 2014-12-16: qty 30

## 2014-12-16 MED ORDER — ACETAMINOPHEN 325 MG PO TABS
650.0000 mg | ORAL_TABLET | ORAL | Status: DC | PRN
  Filled 2014-12-16: qty 2

## 2014-12-16 MED ORDER — OXYCODONE HCL 5 MG PO TABS: 5.0000 mg | ORAL_TABLET | ORAL | Status: DC | PRN

## 2014-12-16 MED ORDER — ONDANSETRON HCL 4 MG/2ML IJ SOLN
INTRAMUSCULAR | Status: AC
  Filled 2014-12-16: qty 2

## 2014-12-16 MED ORDER — OXYCODONE-ACETAMINOPHEN 10-325 MG PO TABS: 1.0000 | ORAL_TABLET | Freq: Four times a day (QID) | ORAL | Status: DC | PRN

## 2014-12-16 MED ORDER — HEPARIN SOD (PORK) LOCK FLUSH 100 UNIT/ML IV SOLN
INTRAVENOUS | Status: AC
  Filled 2014-12-16: qty 5

## 2014-12-16 MED ORDER — SODIUM CHLORIDE 0.9 % IR SOLN
Status: DC | PRN
  Administered 2014-12-16: 500 mL

## 2014-12-16 MED ORDER — BUPIVACAINE HCL (PF) 0.25 % IJ SOLN
INTRAMUSCULAR | Status: DC | PRN
  Administered 2014-12-16: 9 mL

## 2014-12-16 MED ORDER — LIDOCAINE HCL (CARDIAC) 20 MG/ML IV SOLN
INTRAVENOUS | Status: AC
  Filled 2014-12-16: qty 5

## 2014-12-16 MED ORDER — 0.9 % SODIUM CHLORIDE (POUR BTL) OPTIME
TOPICAL | Status: DC | PRN
  Administered 2014-12-16: 1000 mL

## 2014-12-16 MED ORDER — ACETAMINOPHEN 650 MG RE SUPP
650.0000 mg | RECTAL | Status: DC | PRN
  Filled 2014-12-16: qty 1

## 2014-12-16 MED ORDER — FENTANYL CITRATE (PF) 100 MCG/2ML IJ SOLN
INTRAMUSCULAR | Status: DC | PRN
  Administered 2014-12-16 (×3): 25 ug via INTRAVENOUS
  Administered 2014-12-16: 50 ug via INTRAVENOUS
  Administered 2014-12-16 (×3): 25 ug via INTRAVENOUS

## 2014-12-16 MED ORDER — SODIUM CHLORIDE 0.9 % IJ SOLN: 3.0000 mL | INTRAMUSCULAR | Status: DC | PRN

## 2014-12-16 MED ORDER — LACTATED RINGERS IV SOLN
INTRAVENOUS | Status: DC | PRN
  Administered 2014-12-16 (×2): via INTRAVENOUS

## 2014-12-16 MED ORDER — PROPOFOL 10 MG/ML IV BOLUS
INTRAVENOUS | Status: AC
  Filled 2014-12-16: qty 20

## 2014-12-16 SURGICAL SUPPLY — 52 items
BAG DECANTER FOR FLEXI CONT (MISCELLANEOUS) ×3 IMPLANT
BLADE SURG 11 STRL SS (BLADE) ×3 IMPLANT
BLADE SURG 15 STRL LF DISP TIS (BLADE) ×1 IMPLANT
BLADE SURG 15 STRL SS (BLADE) ×2
CANISTER SUCTION 2500CC (MISCELLANEOUS) IMPLANT
CHLORAPREP W/TINT 26ML (MISCELLANEOUS) ×3 IMPLANT
COVER SURGICAL LIGHT HANDLE (MISCELLANEOUS) ×3 IMPLANT
COVER TRANSDUCER ULTRASND GEL (DRAPE) ×3 IMPLANT
CRADLE DONUT ADULT HEAD (MISCELLANEOUS) ×3 IMPLANT
DECANTER SPIKE VIAL GLASS SM (MISCELLANEOUS) ×3 IMPLANT
DRAPE C-ARM 42X72 X-RAY (DRAPES) ×3 IMPLANT
DRAPE LAPAROSCOPIC ABDOMINAL (DRAPES) ×3 IMPLANT
ELECT CAUTERY BLADE 6.4 (BLADE) ×3 IMPLANT
ELECT REM PT RETURN 9FT ADLT (ELECTROSURGICAL) ×3
ELECTRODE REM PT RTRN 9FT ADLT (ELECTROSURGICAL) ×1 IMPLANT
GAUZE SPONGE 4X4 16PLY XRAY LF (GAUZE/BANDAGES/DRESSINGS) ×3 IMPLANT
GEL ULTRASOUND 20GR AQUASONIC (MISCELLANEOUS) IMPLANT
GLOVE BIO SURGEON STRL SZ7 (GLOVE) ×6 IMPLANT
GLOVE BIOGEL PI IND STRL 7.0 (GLOVE) ×1 IMPLANT
GLOVE BIOGEL PI IND STRL 7.5 (GLOVE) ×2 IMPLANT
GLOVE BIOGEL PI INDICATOR 7.0 (GLOVE) ×2
GLOVE BIOGEL PI INDICATOR 7.5 (GLOVE) ×4
GOWN STRL REUS W/ TWL LRG LVL3 (GOWN DISPOSABLE) ×2 IMPLANT
GOWN STRL REUS W/TWL LRG LVL3 (GOWN DISPOSABLE) ×4
INTRODUCER COOK 11FR (CATHETERS) IMPLANT
KIT BASIN OR (CUSTOM PROCEDURE TRAY) ×3 IMPLANT
KIT PORT POWER 8FR ISP CVUE (Catheter) ×3 IMPLANT
KIT ROOM TURNOVER OR (KITS) ×3 IMPLANT
LIQUID BAND (GAUZE/BANDAGES/DRESSINGS) ×3 IMPLANT
NEEDLE HYPO 25GX1X1/2 BEV (NEEDLE) ×3 IMPLANT
NS IRRIG 1000ML POUR BTL (IV SOLUTION) ×3 IMPLANT
PACK SURGICAL SETUP 50X90 (CUSTOM PROCEDURE TRAY) ×3 IMPLANT
PAD ARMBOARD 7.5X6 YLW CONV (MISCELLANEOUS) ×6 IMPLANT
PENCIL BUTTON HOLSTER BLD 10FT (ELECTRODE) ×3 IMPLANT
SET INTRODUCER 12FR PACEMAKER (SHEATH) IMPLANT
SET SHEATH INTRODUCER 10FR (MISCELLANEOUS) IMPLANT
SHEATH COOK PEEL AWAY SET 9F (SHEATH) IMPLANT
STAPLER VISISTAT 35W (STAPLE) ×3 IMPLANT
SUT MNCRL AB 4-0 PS2 18 (SUTURE) ×3 IMPLANT
SUT PROLENE 2 0 SH DA (SUTURE) ×3 IMPLANT
SUT SILK 2 0 (SUTURE)
SUT SILK 2-0 18XBRD TIE 12 (SUTURE) IMPLANT
SUT VIC AB 3-0 SH 27 (SUTURE) ×2
SUT VIC AB 3-0 SH 27XBRD (SUTURE) ×1 IMPLANT
SYR 20ML ECCENTRIC (SYRINGE) ×6 IMPLANT
SYR 5ML LUER SLIP (SYRINGE) ×3 IMPLANT
SYR CONTROL 10ML LL (SYRINGE) IMPLANT
TOWEL OR 17X24 6PK STRL BLUE (TOWEL DISPOSABLE) ×3 IMPLANT
TOWEL OR 17X26 10 PK STRL BLUE (TOWEL DISPOSABLE) ×3 IMPLANT
TUBE CONNECTING 12'X1/4 (SUCTIONS)
TUBE CONNECTING 12X1/4 (SUCTIONS) IMPLANT
YANKAUER SUCT BULB TIP NO VENT (SUCTIONS) IMPLANT

## 2014-12-16 NOTE — Transfer of Care (Signed)
Immediate Anesthesia Transfer of Care Note  Patient: Susan Wilson  Procedure(s) Performed: Procedure(s): INSERTION PORT-A-CATH WITH ULTRASOUND (Right)  Patient Location: PACU  Anesthesia Type:General  Level of Consciousness: patient cooperative and responds to stimulation  Airway & Oxygen Therapy: Patient Spontanous Breathing and Patient connected to nasal cannula oxygen  Post-op Assessment: Report given to RN and Post -op Vital signs reviewed and stable  Post vital signs: Reviewed and stable  Last Vitals:  Filed Vitals:   12/16/14 0628  BP: 132/96  Pulse: 76  Temp: 36.7 C  Resp: 18    Complications: No apparent anesthesia complications

## 2014-12-16 NOTE — Anesthesia Postprocedure Evaluation (Signed)
  Anesthesia Post-op Note  Patient: Susan Wilson  Procedure(s) Performed: Procedure(s): INSERTION PORT-A-CATH WITH ULTRASOUND (Right)  Patient Location: PACU  Anesthesia Type:General  Level of Consciousness: awake and alert   Airway and Oxygen Therapy: Patient Spontanous Breathing  Post-op Pain: mild  Post-op Assessment: Post-op Vital signs reviewed              Post-op Vital Signs: stable  Last Vitals:  Filed Vitals:   12/16/14 0920  BP: 146/84  Pulse: 72  Temp: 36.9 C  Resp: 18    Complications: No apparent anesthesia complications

## 2014-12-16 NOTE — Anesthesia Procedure Notes (Signed)
Procedure Name: LMA Insertion Performed by: Judeth Cornfield T Pre-anesthesia Checklist: Patient identified, Emergency Drugs available, Suction available, Patient being monitored and Timeout performed Patient Re-evaluated:Patient Re-evaluated prior to inductionOxygen Delivery Method: Circle system utilized Preoxygenation: Pre-oxygenation with 100% oxygen Intubation Type: IV induction LMA: LMA inserted LMA Size: 4.0 Number of attempts: 1 Placement Confirmation: ETT inserted through vocal cords under direct vision,  breath sounds checked- equal and bilateral and positive ETCO2 Tube secured with: Tape Dental Injury: Teeth and Oropharynx as per pre-operative assessment

## 2014-12-16 NOTE — H&P (Signed)
39 yof who presents after finding a left breast mass in May. this has begun to cause some discomfort when wearing a bra. She has no discharge. No familly history. No prior breast history. she underwent mm that showed a right liq im fold that measured 2.1x4.1x3.5cm. there are 3 nodes that have cortical thickening. there is additional 5 mm right breast nodule that has not been biopsied. this underwent biposy showing a grade II-III IDC that is er/pr negative, her2 equivocal and Ki is 20%. node is negative but this may be discordant. she is here with family to discuss options.   Other Problems Conni Slipper, RN; 12/03/2014 9:08 AM) Back Pain Breast Cancer Lump In Breast Migraine Headache Umbilical Hernia Repair  Past Surgical History Conni Slipper, RN; 12/03/2014 9:08 AM) Breast Biopsy Right. Sentinel Lymph Node Biopsy  Diagnostic Studies History Conni Slipper, RN; 12/03/2014 9:08 AM) Colonoscopy never Mammogram within last year Pap Smear 1-5 years ago  Social History Conni Slipper, RN; 12/03/2014 9:08 AM) Alcohol use Occasional alcohol use. Caffeine use Carbonated beverages, Coffee. No drug use Tobacco use Never smoker.  Family History Conni Slipper, RN; 12/03/2014 9:08 AM) Arthritis Family Members In General, Father. Bleeding disorder Mother. Cerebrovascular Accident Family Members In [place] Members In General. Diabetes Mellitus Family Members In General. Heart Disease Family Members In General. Hypertension Family Members In General. Migraine Headache Mother.  Pregnancy / Birth History Conni Slipper, RN; 12/03/2014 9:08 AM) Age at menarche 39 years. Contraceptive History Oral contraceptives. Gravida 2 Maternal age 11-30 Para 2 Regular periods  Review of Systems Conni Slipper RN; 12/03/2014 9:08 AM) General Present- Appetite Loss and Fatigue. Not Present- Chills, Fever, Night Sweats, Weight Gain and Weight Loss. Skin Not  Present- Change in Wart/Mole, Dryness, Hives, Jaundice, New Lesions, Non-Healing Wounds, Rash and Ulcer. HEENT Present- Seasonal Allergies. Not Present- Earache, Hearing Loss, Hoarseness, Nose Bleed, Oral Ulcers, Ringing in the Ears, Sinus Pain, Sore Throat, Visual Disturbances, Wears glasses/contact lenses and Yellow Eyes. Respiratory Not Present- Bloody sputum, Chronic Cough, Difficulty Breathing, Snoring and Wheezing. Breast Present- Breast Mass. Not Present- Breast Pain, Nipple Discharge and Skin Changes. Cardiovascular Not Present- Chest Pain, Difficulty Breathing Lying Down, Leg Cramps, Palpitations, Rapid Heart Rate, Shortness of Breath and Swelling of Extremities. Gastrointestinal Not Present- Abdominal Pain, Bloating, Bloody Stool, Change in Bowel Habits, Chronic diarrhea, Constipation, Difficulty Swallowing, Excessive gas, Gets full quickly at meals, Hemorrhoids, Indigestion, Nausea, Rectal Pain and Vomiting. Female Genitourinary Not Present- Frequency, Nocturia, Painful Urination, Pelvic Pain and Urgency. Musculoskeletal Present- Back Pain. Not Present- Joint Pain, Joint Stiffness, Muscle Pain, Muscle Weakness and Swelling of Extremities. Neurological Present- Headaches. Not Present- Decreased Memory, Fainting, Numbness, Seizures, Tingling, Tremor, Trouble walking and Weakness. Psychiatric Not Present- Anxiety, Bipolar, Change in Sleep Pattern, Depression, Fearful and Frequent crying. Endocrine Not Present- Cold Intolerance, Excessive Hunger, Hair Changes, Heat Intolerance, Hot flashes and New Diabetes. Hematology Present- Easy Bruising. Not Present- Excessive bleeding, Gland problems, HIV and Persistent Infections.   Physical Exam Rolm Bookbinder MD; 12/03/2014 11:54 AM) General Mental Status-Alert. Orientation-Oriented X3.  Chest and Lung Exam Chest and lung exam reveals -on auscultation, normal breath sounds, no adventitious sounds and normal vocal  resonance.  Breast Nipples-No Discharge.   Cardiovascular Cardiovascular examination reveals -normal heart sounds, regular rate and rhythm with no murmurs.  Lymphatic Head & Neck  General Head & Neck Lymphatics: Bilateral - Description - Normal. Axillary  General Axillary Region: Bilateral - Description - Normal. Note: no [place] adenopathy  Assessment & Plan Rolm Bookbinder MD; 12/03/2014 12:19 PM) CARCINOMA OF LOWER-INNER QUADRANT OF BREAST, RIGHT (174.3  C50.311) Story: Genetic testing, assess her2 status, mri bilateral breasts, repeat axillary node biopsy, will need at least one additional area in right breast biopsied pending mri We discussed pathophysiology of breast cancer and all treatment options. We discussed role of genetic testing and mri. I think she is good candidate for primary chemotherapy now due to size of mass. will proceed with additional biopsies after mri. she understands surgical decision will be at end of chemotherapy. Discussed port placement and risks involved. will try to schedule for next week.

## 2014-12-16 NOTE — Discharge Instructions (Signed)
° ° °  PORT-A-CATH: POST OP INSTRUCTIONS  Always review your discharge instruction sheet given to you by the facility where your surgery was performed.   1. A prescription for pain medication may be given to you upon discharge. Take your pain medication as prescribed, if needed. If narcotic pain medicine is not needed, then you make take acetaminophen (Tylenol) or ibuprofen (Advil) as needed.  2. Take your usually prescribed medications unless otherwise directed. 3. If you need a refill on your pain medication, please contact our office. All narcotic pain medicine now requires a paper prescription.  Phoned in and fax refills are no longer allowed by law.  Prescriptions will not be filled after 5 pm or on weekends.  4. You should follow a light diet for the remainder of the day after your procedure. 5. Most patients will experience some mild swelling and/or bruising in the area of the incision. It may take several days to resolve. 6. It is common to experience some constipation if taking pain medication after surgery. Increasing fluid intake and taking a stool softener (such as Colace) will usually help or prevent this problem from occurring. A mild laxative (Milk of Magnesia or Miralax) should be taken according to package directions if there are no bowel movements after 48 hours.  7. Unless discharge instructions indicate otherwise, you may remove your bandages 48 hours after surgery, and you may shower at that time. You may have steri-strips (small white skin tapes) in place directly over the incision.  These strips should be left on the skin for 7-10 days.  If your surgeon used Dermabond (skin glue) on the incision, you may shower in 24 hours.  The glue will flake off over the next 2-3 weeks.  8. If your port is left accessed at the end of surgery (needle left in port), the dressing cannot get wet and should only by changed by a healthcare professional. When the port is no longer accessed (when the  needle has been removed), follow step 7.   9. ACTIVITIES:  Limit activity involving your arms for the next 72 hours. Do no strenuous exercise or activity for 1 week. You may drive when you are no longer taking prescription pain medication, you can comfortably wear a seatbelt, and you can maneuver your car. 10.You may need to see your doctor in the office for a follow-up appointment.  Please       check with your doctor.  11.When you receive a new Port-a-Cath, you will get a product guide and        ID card.  Please keep them in case you need them.  WHEN TO CALL YOUR DOCTOR [phone]): 1. Fever over 101.0 2. Chills 3. Continued bleeding from incision 4. Increased redness and tenderness at the site 5. Shortness of breath, difficulty breathing   The clinic staff is available to answer your questions during regular business hours. Please dont hesitate to call and ask to speak to one of the nurses or medical assistants for clinical concerns. If you have a medical emergency, go to the nearest emergency room or call 911.  A surgeon from [facility] is always on call at the hospital.     For further information, please visit [email or web address]

## 2014-12-16 NOTE — Addendum Note (Signed)
Addendum  created 12/16/14 1506 by Verdie Drown, CRNA   Modules edited: Anesthesia Events, Narrator   Narrator:  Narrator: Event Log Edited

## 2014-12-16 NOTE — Op Note (Signed)
Preoperative diagnosis: clinical stage II right breast caner Postoperative diagnosis: same as above Procedure: right ij powerport insertion Surgeon: Dr Serita Grammes EBL: minimal Anes: general  Specimens none Complications none Drains none Sponge count correct Dispo to pacu stable  Indications: This is a 66 yof with newly diagnosed breast cancer. We discussed port placement prior to her beginning chemotherapy.  Procedure: After informed consent was obtained the patient was taken to the operating room. She was given cefazolin. Sequential compression devices were on her legs. She was then placed under general anesthesia with an LMA. She had a roll placed. The she was then prepped and draped in the standard sterile surgical fashion. Surgical timeout was then performed.  I used the ultrasound to identify the right internal jugular vein. I then accessed this with a needle under direct vision. This aspirated blood. I then placed the wire. I then made a pocket below her clavicle on the right side. I tunneled the line between the 2 sites. I then dilated the tract and placed the dilator assembly with the sheath. This was done under fluoroscopy. I then removed the sheath and dilator. The wire was also removed. The wire had been placed in this. The line was then pulled back to be in the distal cava. I hooked this up to the port. I sutured this into place with 2-0 Prolene in 2 places. This aspirated blood and flushed easily. I placed heparin and the port. This was confirmed with a final fluoroscopy. I then closed this with 2-0 Vicryl and 4-0 Monocryl. Dermabond was placed on both the sides. She tolerated this well and was transferred to the recovery room in stable condition.

## 2014-12-16 NOTE — Interval H&P Note (Signed)
History and Physical Interval Note:  12/16/2014 6:37 AM  Susan Wilson  has presented today for surgery, with the diagnosis of breast cancer  The various methods of treatment have been discussed with the patient and family. After consideration of risks, benefits and other options for treatment, the patient has consented to  Procedure(s): INSERTION PORT-A-CATH WITH ULTRASOUND (N/A) as a surgical intervention .  The patient's history has been reviewed, patient examined, no change in status, stable for surgery.  I have reviewed the patient's chart and labs.  Questions were answered to the patient's satisfaction.     Jazelle Achey

## 2014-12-16 NOTE — Anesthesia Preprocedure Evaluation (Addendum)
Anesthesia Evaluation  Patient identified by MRN, date of birth, ID band  Reviewed: Allergy & Precautions, NPO status , Patient's Chart, lab work & pertinent test results  History of Anesthesia Complications (+) PONVNegative for: history of anesthetic complications  Airway Mallampati: I  TM Distance: >3 FB Neck ROM: Full    Dental  (+) Teeth Intact   Pulmonary neg pulmonary ROS,  breath sounds clear to auscultation        Cardiovascular negative cardio ROS  Rhythm:Regular Rate:Normal     Neuro/Psych  Headaches,    GI/Hepatic negative GI ROS, Neg liver ROS,   Endo/Other  Morbid obesity  Renal/GU      Musculoskeletal   Abdominal   Peds  Hematology   Anesthesia Other Findings   Reproductive/Obstetrics                            Anesthesia Physical Anesthesia Plan  ASA: I  Anesthesia Plan: General   Post-op Pain Management:    Induction:   Airway Management Planned: LMA  Additional Equipment:   Intra-op Plan:   Post-operative Plan: Extubation in OR  Informed Consent: I have reviewed the patients History and Physical, chart, labs and discussed the procedure including the risks, benefits and alternatives for the proposed anesthesia with the patient or authorized representative who has indicated his/her understanding and acceptance.     Plan Discussed with: CRNA and Surgeon  Anesthesia Plan Comments:         Anesthesia Quick Evaluation

## 2014-12-17 ENCOUNTER — Telehealth: Payer: Self-pay | Admitting: *Deleted

## 2014-12-17 ENCOUNTER — Encounter (HOSPITAL_COMMUNITY): Payer: Self-pay | Admitting: General Surgery

## 2014-12-17 NOTE — Telephone Encounter (Signed)
  Oncology Nurse Navigator Documentation    Navigator Encounter Type: Telephone (12/17/14 1400)     Barriers/Navigation Needs: No barriers at this time (12/17/14 1400)

## 2014-12-19 ENCOUNTER — Encounter (HOSPITAL_COMMUNITY): Payer: Self-pay

## 2014-12-22 ENCOUNTER — Telehealth: Payer: Self-pay | Admitting: Nurse Practitioner

## 2014-12-22 ENCOUNTER — Encounter: Payer: Self-pay | Admitting: Nurse Practitioner

## 2014-12-22 ENCOUNTER — Ambulatory Visit (HOSPITAL_BASED_OUTPATIENT_CLINIC_OR_DEPARTMENT_OTHER): Payer: 59 | Admitting: Nurse Practitioner

## 2014-12-22 ENCOUNTER — Encounter: Payer: Self-pay | Admitting: *Deleted

## 2014-12-22 VITALS — BP 139/91 | HR 90 | Temp 98.7°F | Resp 18 | Ht 65.0 in | Wt 221.5 lb

## 2014-12-22 DIAGNOSIS — Z171 Estrogen receptor negative status [ER-]: Secondary | ICD-10-CM | POA: Diagnosis not present

## 2014-12-22 DIAGNOSIS — C50311 Malignant neoplasm of lower-inner quadrant of right female breast: Secondary | ICD-10-CM | POA: Diagnosis not present

## 2014-12-22 MED ORDER — PROCHLORPERAZINE MALEATE 10 MG PO TABS: 10.0000 mg | ORAL_TABLET | Freq: Four times a day (QID) | ORAL | Status: DC | PRN

## 2014-12-22 MED ORDER — LORAZEPAM 0.5 MG PO TABS: 0.5000 mg | ORAL_TABLET | Freq: Every day | ORAL | Status: DC

## 2014-12-22 MED ORDER — LIDOCAINE-PRILOCAINE 2.5-2.5 % EX CREA: TOPICAL_CREAM | CUTANEOUS | Status: DC

## 2014-12-22 MED ORDER — DEXAMETHASONE 4 MG PO TABS: ORAL_TABLET | ORAL | Status: DC

## 2014-12-22 MED ORDER — ONDANSETRON HCL 8 MG PO TABS: 8.0000 mg | ORAL_TABLET | Freq: Two times a day (BID) | ORAL | Status: DC | PRN

## 2014-12-22 NOTE — Telephone Encounter (Signed)
Gave avs & calendar for [facility]

## 2014-12-22 NOTE — Progress Notes (Signed)
[place]  [phone]) [phone] [phone]     ID: Susan Wilson DOB: August 17, 1975  MR#: [id number]  WSF#:[id number]  Patient Care Team: Donnamae Jude, MD as PCP - General (Obstetrics and Gynecology) Rolm Bookbinder, MD as Consulting Physician (General Surgery) Chauncey Cruel, MD as Consulting Physician (Oncology) Thea Silversmith, MD as Consulting Physician (Radiation Oncology) [name], RN as Registered Nurse Rockwell Germany, RN as Registered Nurse Holley Bouche, NP as Nurse Practitioner (Nurse Practitioner) PCP: Donnamae Jude, MD OTHER MD:  CHIEF COMPLAINT: Estrogen receptor negative breast cancer  CURRENT TREATMENT: Neoadjuvant chemotherapy   BREAST CANCER HISTORY: Susan Wilson herself noted a mass in her right breast sometime around April. Initially she thought it might be related to menstruation, but as it did not change and eventually became tender, she brought it to her physician's attention. On 11/20/2014 patient underwent bilateral diagnostic mammography with tomosynthesis and right breast ultrasonography at the [facility]. The breast density was category B. There was a hyperdense mass in the right lower inner quadrant associated with skin thickening. There was also a 5 mm nodule posteriorly at the 8:30 o'clock position in the right breast. There were several hyperdense nodules in the right axilla. On physical exam there was a firm fixed mass in the right breast at the 5:00 position. By ultrasound this was lobulated and appear to involve the skin. It measures up to 4.1 cm. There was no sonographic correlation to the 5 mm nodule seen in a different area of the right breast. The right axilla showed 3 hypervascular lymph nodes with prominent cortical thickening, measuring less than 1.5 cm.  On 11/21/2014 the patient underwent right breast biopsy (5:00 mass) and biopsy of one of the suspicious right axillary lymph nodes. 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  The patient's subsequent history is as detailed below  INTERVAL HISTORY: Susan Wilson returns today for follow up of her breast cancer, accompanied by a friend. She had a right chest port placed last week, and is here to learn about her antiemetic schedule. She will begin doxorubicin and cyclophosphamide next Thursday.  REVIEW OF SYSTEMS: The patient denies unusual headaches, visual changes, nausea, vomiting, stiff neck, dizziness, or gait imbalance. There has been no cough, phlegm production, or pleurisy, no chest pain or pressure, and no change in bowel or bladder habits. The patient denies fever, rash, bleeding, unexplained fatigue or unexplained weight loss. The patient does complain of migraines, which is a long-standing problem not more intense or persistent than usual. She has chronic back pain, which is mild. Sometimes she feels fatigued and this keeps her from doing a lot of lifting or bending. She has seasonal allergies. A detailed review of systems was otherwise entirely negative.  PAST MEDICAL HISTORY: Past Medical History  Diagnosis Date  . 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 Breast cancer   . Hernia, umbilical   . Back pain   . Headache   . Anemia   . Hernia, umbilical   . Family history of breast cancer     PAST SURGICAL HISTORY: Past Surgical History  Procedure Laterality Date  . Portacath placement Right 12/16/2014    Procedure: INSERTION PORT-A-CATH WITH ULTRASOUND;  Surgeon: Rolm Bookbinder, MD;  Location: [facility] OR;  Service: General;  Laterality: Right;    FAMILY HISTORY Family History  Problem Relation Age of Onset  . Mesothelioma Maternal Grandfather     asbestos exposure,  died in his 39s  . Heart defect Sister 0  . Heart disease Paternal Aunt   . Heart disease Paternal Uncle   . Breast cancer Paternal Grandmother   . Diabetes Paternal Grandmother   . Cancer Paternal Grandfather     NOS  . Colon cancer Other 49    MGMs brother with colon cancer  . Cancer Other     several of MGF's sisters with cancer NOS  . Liver cancer Other     MGF's brother   the patient's parents are living, her father being 53 and her mother 58 as of July 2016. The patient had no brothers. One sister died at age 30 from cardiac problems. The other sister is in good health. On the maternal side there is a history of colon cancer and an uncle age 7, liver cancer in a great uncle and mesothelioma in the maternal grandfather.  GYNECOLOGIC HISTORY:  Patient's last menstrual period was 12/03/2014. Menarche age 58. The patient is GX P2. She still having regular periods  SOCIAL HISTORY:  Totiana works in Therapist, art for [employer]. Her husband Antrell works for [employer]. The daughters are [place] and Lovie Macadamia, age 48 and 28. The patient attends a local [place]: Not in place   HEALTH MAINTENANCE: History  Substance Use Topics  . Smoking status: Never Smoker   . Smokeless tobacco: Never Used  . Alcohol Use: Yes     Comment: rarely     Colonoscopy:  PAP: 2014  Bone density:  Lipid panel:  No Known Allergies  Current Outpatient Prescriptions  Medication Sig Dispense Refill  . cyclobenzaprine (FLEXERIL) 10 MG tablet Take 10 mg by mouth 3 (three) times daily as needed for muscle spasms.    Marland Kitchen ibuprofen (ADVIL,MOTRIN) 200 MG tablet Take 400 mg by mouth every 6 (six) hours as needed (for pain).     . naproxen (NAPROSYN) 500 MG tablet Take 1 tablet (500 mg total) by mouth 2 (two) times daily. 30 tablet 0  . oxyCODONE-acetaminophen (PERCOCET) 10-325 MG per tablet Take 1 tablet by mouth every 6 (six) hours as needed for pain. 10  tablet 0  . dexamethasone (DECADRON) 4 MG tablet Take 2 tablets by mouth once a day on the day after chemotherapy and then take 2 tablets two times a day for 2 days. Take with food. 30 tablet 1  . lidocaine-prilocaine (EMLA) cream Apply to affected area once 30 g 3  . LORazepam (ATIVAN) 0.5 MG tablet Take 1 tablet (0.5 mg total) by mouth at bedtime. 30 tablet 0  . ondansetron (ZOFRAN) 8 MG tablet Take 1 tablet (8 mg total) by mouth 2 (two) times daily as needed. Start on the third day after chemotherapy. 30 tablet 1  . prochlorperazine (COMPAZINE) 10 MG tablet Take 1 tablet (10 mg total) by mouth every 6 (six) hours as needed (Nausea or vomiting). 30 tablet 1   No current facility-administered medications for this visit.    OBJECTIVE: Young African-American woman who appears well Filed Vitals:   12/22/14 1511  BP: 139/91  Pulse: 90  Temp: 98.7 F (37.1 C)  Resp: 18     Body mass index is 36.86 kg/(m^2).    ECOG FS:0 - Asymptomatic  Skin: warm, dry  HEENT: sclerae anicteric, conjunctivae pink, oropharynx clear. No thrush or mucositis.  Lymph Nodes: No cervical or supraclavicular lymphadenopathy  Lungs: clear to auscultation bilaterally, no rales, wheezes, or rhonci  Heart: regular rate and rhythm  Abdomen: round, soft, non tender, positive bowel sounds  Musculoskeletal: No focal spinal tenderness, no peripheral edema  Neuro: non focal, well oriented, positive affect  Breasts: deferred. New right chest portacath. Clean and dry.  LAB RESULTS:  CMP     Component Value Date/Time   NA 136 12/12/2014 1222   NA 137 12/03/2014 0814   K 4.2 12/12/2014 1222   K 4.3 12/03/2014 0814   CL 102 12/12/2014 1222   CO2 25 12/12/2014 1222   CO2 24 12/03/2014 0814   GLUCOSE 98 12/12/2014 1222   GLUCOSE 100 12/03/2014 0814   BUN 14 12/12/2014 1222   BUN 16.9 12/03/2014 0814   CREATININE 0.69 12/12/2014 1222   CREATININE 0.8 12/03/2014 0814   CALCIUM 9.3 12/12/2014 1222   CALCIUM 9.3  12/03/2014 0814   PROT 7.5 12/03/2014 0814   ALBUMIN 3.9 12/03/2014 0814   AST 14 12/03/2014 0814   ALT 17 12/03/2014 0814   ALKPHOS 59 12/03/2014 0814   BILITOT 0.46 12/03/2014 0814   GFRNONAA >60 12/12/2014 1222   GFRAA >60 12/12/2014 1222    INo results found for: SPEP, UPEP  Lab Results  Component Value Date   WBC 4.5 12/12/2014   NEUTROABS 3.1 12/03/2014   HGB 11.3* 12/12/2014   HCT 36.0 12/12/2014   MCV 77.9* 12/12/2014   PLT 348 12/12/2014      Chemistry      Component Value Date/Time   NA 136 12/12/2014 1222   NA 137 12/03/2014 0814   K 4.2 12/12/2014 1222   K 4.3 12/03/2014 0814   CL 102 12/12/2014 1222   CO2 25 12/12/2014 1222   CO2 24 12/03/2014 0814   BUN 14 12/12/2014 1222   BUN 16.9 12/03/2014 0814   CREATININE 0.69 12/12/2014 1222   CREATININE 0.8 12/03/2014 0814      Component Value Date/Time   CALCIUM 9.3 12/12/2014 1222   CALCIUM 9.3 12/03/2014 0814   ALKPHOS 59 12/03/2014 0814   AST 14 12/03/2014 0814   ALT 17 12/03/2014 0814   BILITOT 0.46 12/03/2014 0814       No results found for: LABCA2  No components found for: LABCA125  No results for input(s): INR in the last 168 hours.  Urinalysis    Component Value Date/Time   LABSPEC 1.020 05/06/2009 1439   PHURINE 7.0 05/06/2009 1439   GLUCOSEU NEGATIVE 05/06/2009 1439   HGBUR NEGATIVE 05/06/2009 1439   BILIRUBINUR NEGATIVE 05/06/2009 1439   KETONESUR NEGATIVE 05/06/2009 1439   PROTEINUR NEGATIVE 05/06/2009 1439   UROBILINOGEN 0.2 05/06/2009 1439   NITRITE NEGATIVE 05/06/2009 1439   LEUKOCYTESUR  05/06/2009 1439    NEGATIVE Biochemical Testing Only. Please order routine urinalysis from main lab if confirmatory testing is needed.    STUDIES: Mr Breast Bilateral W Wo Contrast  12/05/2014   CLINICAL DATA:  Patient with recent diagnosis of invasive mammary carcinoma right breast. Evaluate extent of disease.  LABS:  None.  EXAM: BILATERAL BREAST MRI WITH AND WITHOUT CONTRAST  TECHNIQUE:  Multiplanar, multisequence MR images of both breasts were obtained prior to and following the intravenous administration of 20 ml of MultiHance.  THREE-DIMENSIONAL MR IMAGE RENDERING ON INDEPENDENT WORKSTATION:  Three-dimensional MR images were rendered by post-processing of the original MR data on an independent  workstation. The three-dimensional MR images were interpreted, and findings are reported in the following complete MRI report for this study. Three dimensional images were evaluated at the independent DynaCad workstation  COMPARISON:  Previous exam(s).  FINDINGS: Breast composition: b. Scattered fibroglandular tissue.  Background parenchymal enhancement: Mild.  Right breast: There is evidence of patient's biopsy-proven malignancy containing clip artifact over the posterior third of the inner lower quadrant of the right breast measuring 3.8 x 4.7 x 5.0 cm. This mass extends to the skin surface with enhancement/ thickening of the adjacent skin. There is edema posterior to the mass with mild hazy enhancement posterior to the mass extending up to and abutting the adjacent lateral aspect of the pectoralis muscle with possible subtle enhancement along the surface of the pectoralis. Remainder of the right breast is within normal.  Left breast: There is an oval circumscribed enhancing mass over the anterior third of the outer lower quadrant of the left breast measuring 7 x 9 x 10 mm.  Lymph nodes: Evidence of biopsy-proven benign lymph node with clip artifact over the right axilla. 2-3 suspicious right axillary lymph nodes deeper and slightly superior to the biopsied lymph node in the right axilla. No suspicious left axillary lymph nodes. No internal mammary nodes.  Ancillary findings:  None.  IMPRESSION: Biopsy proven malignancy over the posterior third of the inner lower right breast measuring 3.8 x 4.7 x 5.0 cm. Evidence of involvement of the overlying skin with possible involvement of the inferior lateral  aspect of the pectoralis. Right axillary lymph nodes remain suspicious.  Indeterminate oval circumscribed enhancing mass over the anterior third of the outer lower left breast measuring 7 x 9 x 10 mm.  RECOMMENDATION: Recommend second-look left breast ultrasound and if not seen sonographically, would recommend MRI guided left breast biopsy of the indeterminate mass in the outer lower breast.  BI-RADS CATEGORY  6: Known biopsy-proven malignancy.   Electronically Signed   By: Marin Olp M.D.   On: 12/05/2014 10:12   Dg Chest Port 1 View  12/16/2014   CLINICAL DATA:  Port-A-Cath placement.  EXAM: PORTABLE CHEST - 1 VIEW  COMPARISON:  Two-view chest x-ray  FINDINGS: Borderline cardiomegaly is exaggerated by low lung volumes. A right IJ Port-A-Cath is in place. The tip projects just above the cavoatrial junction. There is no pneumothorax. The lungs are clear. The visualized soft tissues and bony thorax are unremarkable.  IMPRESSION: 1. Interval placement of right IJ Port-A-Cath without radiographic evidence for complication. 2. Borderline cardiomegaly.   Electronically Signed   By: San Morelle M.D.   On: 12/16/2014 09:01   Mm Digital Diagnostic Bilat  12/12/2014   CLINICAL DATA:  Status post ultrasound-guided core biopsy of a mass the 4 o'clock location of the left breast 3 cm from the nipple. Status post ultrasound-guided core biopsy of a second right axillary lymph node.  EXAM: DIAGNOSTIC BILATERAL MAMMOGRAM POST ULTRASOUND BIOPSY x2  COMPARISON:  Previous exam(s).  FINDINGS: Mammographic images were obtained following ultrasound guided biopsy of mass in the 4 o'clock location of the left breast. A coil shaped tissue marker clip is identified in the lower outer quadrant of the left breast as expected. Tissue marker clip is in expected location following biopsy of the left breast.  Mammogram of the right axilla shows 2 small coil shaped clips following ultrasound-guided core biopsies of lower scratched  axillary lymph nodes.  IMPRESSION: Tissue marker clips are in expected locations following biopsy.  Final Assessment: Post Procedure Mammograms  for Marker Placement   Electronically Signed   By: Nolon Nations M.D.   On: 12/12/2014 15:39   Mm Radiologist Eval And Mgmt  11/24/2014   EXAM: ESTABLISHED PATIENT OFFICE VISIT - LEVEL II  CHIEF COMPLAINT: Status post ultrasound-guided core needle biopsy of the right breast and a right axillary lymph node.  HISTORY OF PRESENT ILLNESS: 39 year old woman with self palpated right breast mass, and a prominent lymph node seen mammographically.  EXAM: Patient presented for discussion of post biopsy pathology results. She describes residual tenderness in her right breast, but not right axilla. On physical exam, the biopsy site demonstrates mild hematoma, without evidence of infection. The skin is healing normally.  PATHOLOGY: Right breast mass:  Invasive mammary carcinoma, grade 2-3.  Right axillary lymph :  Benign lymph node.  ASSESSMENT AND PLAN: ASSESSMENT AND PLAN Pathology results were discussed with the patient and her husband. She was given a referral for multidisciplinary clinic at [facility] to discuss treatment options and establish treatment plan. Educational materials were also provided to the patient.  The patient expresses understanding of the pathology results, and potential treatment options.   Electronically Signed   By: Fidela Salisbury M.D.   On: 11/24/2014 17:31   Dg C-arm 1-60 Min-no Report  12/16/2014   CLINICAL DATA: breast cancer   C-ARM 1-60 MINUTES  Fluoroscopy was utilized by the requesting physician.  No radiographic  interpretation.    US Breast Ltd Uni Left Inc Axilla  12/12/2014   CLINICAL DATA:  Patient presents for second look ultrasound following MRI. Known right breast cancer.  EXAM: ULTRASOUND OF THE LEFT BREAST  COMPARISON:  Previous exam(s).  FINDINGS: On physical exam, I palpate no abnormality in the lower outer  quadrant of the left breast.  Targeted ultrasound is performed, showing a hypoechoic mass in the 4 o'clock location of the left breast 3 cm from the nipple which measures 0.8 x 0.5 x 0.9 cm. Nodule demonstrates vascularity on Doppler evaluation. Just lateral to this mass there is a second small hypoechoic nodule in the 4 o'clock location 5 cm from the nipple which measures 0.8 x 0.4 x 0.6 cm. This nodule demonstrates no increased vascularity on Doppler evaluation. On correlation with MRI in the nodule 3 cm from nipple is felt to correlate with the MRI abnormality. The second nodule in the 4 o'clock location 5 cm from nipple correlates with a nonenhancing mass seen on MRI.  IMPRESSION: Ultrasound-guided core biopsy is recommended of the mass in the 4 o'clock location of the left breast 3 cm from nipple.  RECOMMENDATION: Ultrasound-guided core biopsy is performed on the same day and is dictated separately.  I have discussed the findings and recommendations with the patient. Results were also provided in writing at the conclusion of the visit. If applicable, a reminder letter will be sent to the patient regarding the next appointment.  BI-RADS CATEGORY  4: Suspicious.   Electronically Signed   By: Nolon Nations M.D.   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 This was found to be concordant by Dr. Shon Hale.  When the patient returned for recent biopsies, the question of a mammographically detected right lateral right breast nodule was reviewed. In light of the  minimal mammographic abnormality and lack of sonographic and MRI correlate, no further evaluation is felt to be necessary.  Pathology was discussed with the patient by telephone. She reported doing well after the biopsy with  tenderness at both sites. Post biopsy instructions and care were reviewed and her questions were answered. She was encouraged to call [facility] for any additional questions or concerns. She was recently diagnosed with right breast invasive ductal carcinoma and was seen at [facility]. The patient is instructed to follow her outlined treatment plan.  Pathology results reported by Susa Raring RN, BSN on December 15, 2014.   Electronically Signed   By: Nolon Nations M.D.   On: 12/15/2014 11:38   12/12/2014   ADDENDUM REPORT: 12/12/2014 15:37  ADDENDUM: Biopsy is performed of the mass in the 4 o'clock location 3 cm from the nipple.   Electronically Signed   By: Nolon Nations M.D.   On: 12/12/2014 15:37   12/15/2014   CLINICAL DATA:  Left breast nodule detected on MRI. Known right breast cancer.  EXAM: ULTRASOUND GUIDED LEFT BREAST CORE NEEDLE BIOPSY  COMPARISON:  Previous exam(s).  FINDINGS: I met with the patient and we discussed the procedure of ultrasound-guided biopsy, including benefits and alternatives. We discussed the high likelihood of a successful procedure. We discussed the risks of the procedure, including infection, bleeding, tissue injury, clip migration, and inadequate sampling. Informed written consent was given. The usual time-out protocol was performed immediately prior to the procedure.  Using sterile technique and 2% Lidocaine as local anesthetic, under direct ultrasound visualization, a 14 gauge spring-loaded device was used to perform biopsy of nodule in the 4 o'clock location of the left breast 5 cm from the nipple using a lateral approach. At the conclusion of the procedure a coil shaped tissue marker clip was deployed into the biopsy cavity. Follow up 2 view mammogram was performed and dictated separately.  IMPRESSION: Ultrasound guided biopsy of left breast mass. No apparent complications.  Electronically Signed: By:  Nolon Nations M.D. 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 This was found to be concordant by Dr. Shon Hale. When the patient returned for recent biopsies, the question of a mammographically detected right lateral right breast nodule was reviewed. In light of the minimal mammographic abnormality and lack of sonographic and MRI correlate, no further evaluation is felt to be necessary.  Pathology was discussed with the patient by telephone. She reported doing well after the biopsy with tenderness at both sites. Post biopsy instructions and care were reviewed and her questions were answered. She was encouraged to call [facility] for any additional questions or concerns. She was recently diagnosed with right breast invasive ductal carcinoma and was seen at [facility]. The patient is instructed to follow her outlined treatment plan.  Pathology results reported by Susa Raring RN, BSN on December 15, 2014.   Electronically Signed   By: Nolon Nations M.D.   On: 12/15/2014 11:39   12/15/2014   CLINICAL DATA:  Patient returns for biopsy of right axillary lymph node. Previous ultrasound-guided core biopsy showed benign lymph node tissue but was felt to be  discordant. MRI confirms suspicious nature of several right axillary lymph nodes.  EXAM: ULTRASOUND GUIDED RIGHT BREAST CORE NEEDLE BIOPSY  COMPARISON:  Previous exam(s).  FINDINGS: Initially ultrasound is performed, confirming presence of at least 3 morphologically abnormal is lymph nodes in the right axilla. The lower most node is marked with a tissue marker clip following recent biopsy. A node immediately above this previously biopsied lymph node was selected for  biopsy site.  I met with the patient and we discussed the procedure of ultrasound-guided biopsy, including benefits and alternatives. We discussed the high likelihood of a successful procedure. We discussed the risks of the procedure, including infection, bleeding, tissue injury, clip migration, and inadequate sampling. Informed written consent was given. The usual time-out protocol was performed immediately prior to the procedure.  Using sterile technique and 2% Lidocaine as local anesthetic, under direct ultrasound visualization, a 14 gauge spring-loaded device was used to perform biopsy of enlarged right axillary lymph node just superior to the previously biopsied node using a lateral approach. At the conclusion of the procedure a HydroMARK was deployed into the biopsy cavity. Follow up 2 view mammogram was performed and dictated separately.  IMPRESSION: Ultrasound guided biopsy of right axillary node. No apparent complications.  Electronically Signed: By: Nolon Nations M.D. On: 12/12/2014 15:33    ASSESSMENT: 39 y.o. Susan Wilson woman s/p Right breast biopsy 11/21/2014 for a clinical T2 NX, stage 2 invasive ductal carcinoma, grade 2 or 3, estrogen and progesterone receptor negative, HER-2 equivocal, with an Mib-1 of 20%  (a) biopsy of a suspicious axillary lymph node same day was negative, but discordant  (1) neoadjuvant chemotherapy will consist of cyclophosphamide and doxorubicin in dose dense fashion 4, with onpro support, to be followed by paclitaxel weekly 12  (a) if the patient's tumor proves to be HER-2 positive on review, trastuzumab and pertuzumab will be added with the Taxol treatments  (2) genetics testing pending  (3) definitive surgery depending on genetics result, to follow chemotherapy  (4) radiation to follow surgery  PLAN: Susan Wilson and I spent about 40 minutes discussing her upcoming chemotherapy plans. She was made aware of potential side effects and toxicities of both  cyclophosphamide and doxorubicin. She attended chemotherapy school 2 weeks earlier so a lot of this information was a review for her. She was made aware of her neulasta injections, including the fact that these will be administered with an onbody injection that she will need to wear until the device is no longer active.   Finally, we reviewed her antiemetic schedule, and she feels comfortable with every medication listed as well as the indication and potential side effects. These prescriptions were sent to her pharmacy today.  Hector will return next Thursday for her first cycle of treatment. She understands and agrees with this plan. She knows the goal of treatment in her case is cure. She has been encouraged to call with any issues that might arise before her next visit here.  Laurie Panda, NP   12/22/2014 4:05 PM

## 2014-12-25 ENCOUNTER — Ambulatory Visit: Payer: 59

## 2014-12-25 ENCOUNTER — Other Ambulatory Visit: Payer: 59

## 2014-12-26 ENCOUNTER — Encounter: Payer: Self-pay | Admitting: Genetic Counselor

## 2014-12-26 ENCOUNTER — Telehealth: Payer: Self-pay | Admitting: Genetic Counselor

## 2014-12-26 DIAGNOSIS — Z1379 Encounter for other screening for genetic and chromosomal anomalies: Secondary | ICD-10-CM | POA: Insufficient documentation

## 2014-12-26 NOTE — Telephone Encounter (Signed)
LM on VM with good news.  Asked that she call back.

## 2014-12-30 ENCOUNTER — Telehealth: Payer: Self-pay | Admitting: Genetic Counselor

## 2014-12-30 ENCOUNTER — Ambulatory Visit: Payer: Self-pay | Admitting: Genetic Counselor

## 2014-12-30 DIAGNOSIS — Z1379 Encounter for other screening for genetic and chromosomal anomalies: Secondary | ICD-10-CM

## 2014-12-30 DIAGNOSIS — C50311 Malignant neoplasm of lower-inner quadrant of right female breast: Secondary | ICD-10-CM

## 2014-12-30 DIAGNOSIS — Z803 Family history of malignant neoplasm of breast: Secondary | ICD-10-CM

## 2014-12-30 NOTE — Telephone Encounter (Signed)
LM on VM with good news.  Asked that she CB.

## 2014-12-30 NOTE — Telephone Encounter (Signed)
Revealed negative genetic testing on the 20 genes from the Breast/Ovarian cancer panel.

## 2014-12-30 NOTE — Progress Notes (Signed)
HPI: [name] was previously seen in the [facility] due to a personal history of triple negative breast cancer and concerns regarding a hereditary predisposition to cancer. Please refer to our prior cancer genetics clinic note for more information regarding [name]'s medical, social and family histories, and our assessment and recommendations, at the time. [name]'s recent genetic test results were disclosed to her, as were recommendations warranted by these results. These results and recommendations are discussed in more detail below.  GENETIC TEST RESULTS: At the time of [name]'s visit, we recommended she pursue genetic testing of the Breast/[place] cancer gene panel. The Breast/Ovarian gene panel offered by [facility] includes sequencing and rearrangement analysis for the following 20 genes:  ATM, BARD1, BRCA1, BRCA2, BRIP1, CDH1, CHEK2, EPCAM, FANCC, MLH1, MSH2, MSH6, NBN, PALB2, PMS2, PTEN, RAD51C, RAD51D, TP53, and XRCC2.   The report date is December 25, 2014.  Genetic testing was normal, and did not reveal a deleterious mutation in these genes. The test report has been scanned into EPIC and is located under the Media tab.   We discussed with [name] that since the current genetic testing is not perfect, it is possible there may be a gene mutation in one of these genes that current testing cannot detect, but that chance is small. We also discussed, that it is possible that another gene that has not yet been discovered, or that we have not yet tested, is responsible for the cancer diagnoses in the family, and it is, therefore, important to remain in touch with cancer genetics in the future so that we can continue to offer [name] the most up to date genetic testing.   CANCER SCREENING RECOMMENDATIONS: This result is reassuring and indicates that [name] likely does not have an increased risk for a future cancer due  to a mutation in one of these genes. This normal test also suggests that [name]'s cancer was most likely not due to an inherited predisposition associated with one of these genes.  Most cancers happen by chance and this negative test suggests that her cancer falls into this category.  We, therefore, recommended she continue to follow the cancer management and screening guidelines provided by her oncology and primary healthcare provider.   RECOMMENDATIONS FOR FAMILY MEMBERS: Women in this family might be at some increased risk of developing cancer, over the general population risk, simply due to the family history of cancer. We recommended women in this family have a yearly mammogram beginning at age 50, or 58 years younger than the earliest onset of cancer, an an annual clinical breast exam, and perform monthly breast self-exams. Women in this family should also have a gynecological exam as recommended by their primary provider. All family members should have a colonoscopy by age 51.  FOLLOW-UP: Lastly, we discussed with [name] that cancer genetics is a rapidly advancing field and it is possible that new genetic tests will be appropriate for her and/or her family members in the future. We encouraged her to remain in contact with cancer genetics on an annual basis so we can update her personal and family histories and let her know of advances in cancer genetics that may benefit this family.   Our contact number was provided. [name]'s questions were answered to her satisfaction, and she knows she is welcome to call us at anytime with additional questions or concerns.   Roma Kayser, MS, [facility] Certified Genetic Counselor [email or web address]

## 2015-01-01 ENCOUNTER — Ambulatory Visit (HOSPITAL_BASED_OUTPATIENT_CLINIC_OR_DEPARTMENT_OTHER): Payer: 59

## 2015-01-01 ENCOUNTER — Encounter: Payer: Self-pay | Admitting: *Deleted

## 2015-01-01 ENCOUNTER — Other Ambulatory Visit (HOSPITAL_BASED_OUTPATIENT_CLINIC_OR_DEPARTMENT_OTHER): Payer: 59

## 2015-01-01 ENCOUNTER — Encounter: Payer: Self-pay | Admitting: Nurse Practitioner

## 2015-01-01 VITALS — BP 129/86 | HR 75

## 2015-01-01 DIAGNOSIS — Z5111 Encounter for antineoplastic chemotherapy: Secondary | ICD-10-CM | POA: Diagnosis not present

## 2015-01-01 DIAGNOSIS — C50311 Malignant neoplasm of lower-inner quadrant of right female breast: Secondary | ICD-10-CM

## 2015-01-01 LAB — CBC WITH DIFFERENTIAL/PLATELET
BASO%: 0.8 % (ref 0.0–2.0)
BASOS ABS: 0.1 10*3/uL (ref 0.0–0.1)
EOS%: 2.1 % (ref 0.0–7.0)
Eosinophils Absolute: 0.1 10*3/uL (ref 0.0–0.5)
HCT: 35 % (ref 34.8–46.6)
HEMOGLOBIN: 11.1 g/dL — AB (ref 11.6–15.9)
LYMPH%: 26.4 % (ref 14.0–49.7)
MCH: 24.2 pg — ABNORMAL LOW (ref 25.1–34.0)
MCHC: 31.6 g/dL (ref 31.5–36.0)
MCV: 76.5 fL — AB (ref 79.5–101.0)
MONO#: 0.6 10*3/uL (ref 0.1–0.9)
MONO%: 8.6 % (ref 0.0–14.0)
NEUT%: 62.1 % (ref 38.4–76.8)
NEUTROS ABS: 4.1 10*3/uL (ref 1.5–6.5)
Platelets: 263 10*3/uL (ref 145–400)
RBC: 4.57 10*6/uL (ref 3.70–5.45)
RDW: 14.7 % — AB (ref 11.2–14.5)
WBC: 6.6 10*3/uL (ref 3.9–10.3)
lymph#: 1.7 10*3/uL (ref 0.9–3.3)

## 2015-01-01 LAB — COMPREHENSIVE METABOLIC PANEL (CC13)
ALBUMIN: 3.9 g/dL (ref 3.5–5.0)
ALK PHOS: 72 U/L (ref 40–150)
ALT: 16 U/L (ref 0–55)
ANION GAP: 10 meq/L (ref 3–11)
AST: 12 U/L (ref 5–34)
BILIRUBIN TOTAL: 0.83 mg/dL (ref 0.20–1.20)
BUN: 10.7 mg/dL (ref 7.0–26.0)
CALCIUM: 9.4 mg/dL (ref 8.4–10.4)
CO2: 23 mEq/L (ref 22–29)
Chloride: 106 mEq/L (ref 98–109)
Creatinine: 0.8 mg/dL (ref 0.6–1.1)
Glucose: 98 mg/dl (ref 70–140)
Potassium: 4 mEq/L (ref 3.5–5.1)
Sodium: 139 mEq/L (ref 136–145)
TOTAL PROTEIN: 7.3 g/dL (ref 6.4–8.3)

## 2015-01-01 MED ORDER — SODIUM CHLORIDE 0.9 % IV SOLN
Freq: Once | INTRAVENOUS | Status: AC
  Administered 2015-01-01: 11:00:00 via INTRAVENOUS

## 2015-01-01 MED ORDER — PALONOSETRON HCL INJECTION 0.25 MG/5ML
INTRAVENOUS | Status: AC
  Filled 2015-01-01: qty 5

## 2015-01-01 MED ORDER — SODIUM CHLORIDE 0.9 % IJ SOLN
10.0000 mL | INTRAMUSCULAR | Status: DC | PRN
  Administered 2015-01-01: 10 mL
  Filled 2015-01-01: qty 10

## 2015-01-01 MED ORDER — PEGFILGRASTIM 6 MG/0.6ML ~~LOC~~ PSKT
6.0000 mg | PREFILLED_SYRINGE | Freq: Once | SUBCUTANEOUS | Status: AC
  Administered 2015-01-01: 6 mg via SUBCUTANEOUS
  Filled 2015-01-01: qty 0.6

## 2015-01-01 MED ORDER — DOXORUBICIN HCL CHEMO IV INJECTION 2 MG/ML
60.0000 mg/m2 | Freq: Once | INTRAVENOUS | Status: AC
  Administered 2015-01-01: 128 mg via INTRAVENOUS
  Filled 2015-01-01: qty 64

## 2015-01-01 MED ORDER — SODIUM CHLORIDE 0.9 % IV SOLN
Freq: Once | INTRAVENOUS | Status: AC
  Administered 2015-01-01: 12:00:00 via INTRAVENOUS
  Filled 2015-01-01: qty 5

## 2015-01-01 MED ORDER — PALONOSETRON HCL INJECTION 0.25 MG/5ML
0.2500 mg | Freq: Once | INTRAVENOUS | Status: AC
  Administered 2015-01-01: 0.25 mg via INTRAVENOUS

## 2015-01-01 MED ORDER — SODIUM CHLORIDE 0.9 % IV SOLN
600.0000 mg/m2 | Freq: Once | INTRAVENOUS | Status: AC
  Administered 2015-01-01: 1280 mg via INTRAVENOUS
  Filled 2015-01-01: qty 64

## 2015-01-01 MED ORDER — HEPARIN SOD (PORK) LOCK FLUSH 100 UNIT/ML IV SOLN
500.0000 [IU] | Freq: Once | INTRAVENOUS | Status: AC | PRN
  Administered 2015-01-01: 500 [IU]
  Filled 2015-01-01: qty 5

## 2015-01-01 NOTE — Patient Instructions (Signed)
[facility] Discharge Instructions for Patients Receiving Chemotherapy  Today you received the following chemotherapy agents adriamycin and cytoxan  To help prevent nausea and vomiting after your treatment, we encourage you to take your nausea medication.   If you develop nausea and vomiting that is not controlled by your nausea medication, call the clinic.   BELOW ARE SYMPTOMS THAT SHOULD BE REPORTED IMMEDIATELY:  *FEVER GREATER THAN 100.5 F  *CHILLS WITH OR WITHOUT FEVER  NAUSEA AND VOMITING THAT IS NOT CONTROLLED WITH YOUR NAUSEA MEDICATION  *UNUSUAL SHORTNESS OF BREATH  *UNUSUAL BRUISING OR BLEEDING  TENDERNESS IN MOUTH AND THROAT WITH OR WITHOUT PRESENCE OF ULCERS  *URINARY PROBLEMS  *BOWEL PROBLEMS  UNUSUAL RASH Items with * indicate a potential emergency and should be followed up as soon as possible.  Feel free to call the clinic you have any questions or concerns. The clinic phone number is [phone].  Please show the [facility] at check-in to the Emergency Department and triage nurse. Doxorubicin injection What is this medicine? DOXORUBICIN (dox oh ROO bi sin) is a chemotherapy drug. It is used to treat many kinds of cancer like Hodgkin's disease, leukemia, non-Hodgkin's lymphoma, neuroblastoma, sarcoma, and Wilms' tumor. It is also used to treat bladder cancer, breast cancer, lung cancer, ovarian cancer, stomach cancer, and thyroid cancer. This medicine may be used for other purposes; ask your health care provider or pharmacist if you have questions. COMMON BRAND NAME(S): Adriamycin, Adriamycin PFS, Adriamycin RDF, Rubex What should I tell my health care provider before I take this medicine? They need to know if you have any of these conditions: -blood disorders -heart disease, recent heart attack -infection (especially a virus infection such as chickenpox, cold sores, or herpes) -irregular heartbeat -liver disease -recent or ongoing  radiation therapy -an unusual or allergic reaction to doxorubicin, other chemotherapy agents, other medicines, foods, dyes, or preservatives -pregnant or trying to get pregnant -breast-feeding How should I use this medicine? This drug is given as an infusion into a vein. It is administered in a hospital or clinic by a specially trained health care professional. If you have pain, swelling, burning or any unusual feeling around the site of your injection, tell your health care professional right away. Talk to your pediatrician regarding the use of this medicine in children. Special care may be needed. Overdosage: If you think you have taken too much of this medicine contact a poison control center or emergency room at once. NOTE: This medicine is only for you. Do not share this medicine with others. What if I miss a dose? It is important not to miss your dose. Call your doctor or health care professional if you are unable to keep an appointment. What may interact with this medicine? Do not take this medicine with any of the following medications: -cisapride -droperidol -halofantrine -pimozide -zidovudine This medicine may also interact with the following medications: -chloroquine -chlorpromazine -clarithromycin -cyclophosphamide -cyclosporine -erythromycin -medicines for depression, anxiety, or psychotic disturbances -medicines for irregular heart beat like amiodarone, bepridil, dofetilide, encainide, flecainide, propafenone, quinidine -medicines for seizures like ethotoin, fosphenytoin, phenytoin -medicines for nausea, vomiting like dolasetron, ondansetron, palonosetron -medicines to increase blood counts like filgrastim, pegfilgrastim, sargramostim -methadone -methotrexate -pentamidine -progesterone -vaccines -verapamil Talk to your doctor or health care professional before taking any of these medicines: -acetaminophen -aspirin -ibuprofen -ketoprofen -naproxen This list may  not describe all possible interactions. Give your health care provider a list of all the medicines, herbs, non-prescription drugs, or  dietary supplements you use. Also tell them if you smoke, drink alcohol, or use illegal drugs. Some items may interact with your medicine. What should I watch for while using this medicine? Your condition will be monitored carefully while you are receiving this medicine. You will need important blood work done while you are taking this medicine. This drug may make you feel generally unwell. This is not uncommon, as chemotherapy can affect healthy cells as well as cancer cells. Report any side effects. Continue your course of treatment even though you feel ill unless your doctor tells you to stop. Your urine may turn red for a few days after your dose. This is not blood. If your urine is dark or brown, call your doctor. In some cases, you may be given additional medicines to help with side effects. Follow all directions for their use. Call your doctor or health care professional for advice if you get a fever, chills or sore throat, or other symptoms of a cold or flu. Do not treat yourself. This drug decreases your body's ability to fight infections. Try to avoid being around people who are sick. This medicine may increase your risk to bruise or bleed. Call your doctor or health care professional if you notice any unusual bleeding. Be careful brushing and flossing your teeth or using a toothpick because you may get an infection or bleed more easily. If you have any dental work done, tell your dentist you are receiving this medicine. Avoid taking products that contain aspirin, acetaminophen, ibuprofen, naproxen, or ketoprofen unless instructed by your doctor. These medicines may hide a fever. Men and women of childbearing age should use effective birth control methods while using taking this medicine. Do not become pregnant while taking this medicine. There is a potential for  serious side effects to an unborn child. Talk to your health care professional or pharmacist for more information. Do not breast-feed an infant while taking this medicine. Do not let others touch your urine or other body fluids for 5 days after each treatment with this medicine. Caregivers should wear latex gloves to avoid touching body fluids during this time. There is a maximum amount of this medicine you should receive throughout your life. The amount depends on the medical condition being treated and your overall health. Your doctor will watch how much of this medicine you receive in your lifetime. Tell your doctor if you have taken this medicine before. What side effects may I notice from receiving this medicine? Side effects that you should report to your doctor or health care professional as soon as possible: -allergic reactions like skin rash, itching or hives, swelling of the face, lips, or tongue -low blood counts - this medicine may decrease the number of white blood cells, red blood cells and platelets. You may be at increased risk for infections and bleeding. -signs of infection - fever or chills, cough, sore throat, pain or difficulty passing urine -signs of decreased platelets or bleeding - bruising, pinpoint red spots on the skin, black, tarry stools, blood in the urine -signs of decreased red blood cells - unusually weak or tired, fainting spells, lightheadedness -breathing problems -chest pain -fast, irregular heartbeat -mouth sores -nausea, vomiting -pain, swelling, redness at site where injected -pain, tingling, numbness in the hands or feet -swelling of ankles, feet, or hands -unusual bleeding or bruising Side effects that usually do not require medical attention (report to your doctor or health care professional if they continue or are bothersome): -diarrhea -facial  flushing -hair loss -loss of appetite -missed menstrual periods -nail discoloration or damage -red or  watery eyes -red colored urine -stomach upset This list may not describe all possible side effects. Call your doctor for medical advice about side effects. You may report side effects to FDA at 1-800-FDA-1088. Where should I keep my medicine? This drug is given in a hospital or clinic and will not be stored at home. NOTE: This sheet is a summary. It may not cover all possible information. If you have questions about this medicine, talk to your doctor, pharmacist, or health care provider.  2015, Elsevier/Gold Standard. (2012-08-28 09:54:34) Cyclophosphamide injection What is this medicine? CYCLOPHOSPHAMIDE (sye kloe FOSS fa mide) is a chemotherapy drug. It slows the growth of cancer cells. This medicine is used to treat many types of cancer like lymphoma, myeloma, leukemia, breast cancer, and ovarian cancer, to name a few. This medicine may be used for other purposes; ask your health care provider or pharmacist if you have questions. COMMON BRAND NAME(S): Cytoxan, Neosar What should I tell my health care provider before I take this medicine? They need to know if you have any of these conditions: -blood disorders -history of other chemotherapy -infection -kidney disease -liver disease -recent or ongoing radiation therapy -tumors in the bone marrow -an unusual or allergic reaction to cyclophosphamide, other chemotherapy, other medicines, foods, dyes, or preservatives -pregnant or trying to get pregnant -breast-feeding How should I use this medicine? This drug is usually given as an injection into a vein or muscle or by infusion into a vein. It is administered in a hospital or clinic by a specially trained health care professional. Talk to your pediatrician regarding the use of this medicine in children. Special care may be needed. Overdosage: If you think you have taken too much of this medicine contact a poison control center or emergency room at once. NOTE: This medicine is only for you.  Do not share this medicine with others. What if I miss a dose? It is important not to miss your dose. Call your doctor or health care professional if you are unable to keep an appointment. What may interact with this medicine? This medicine may interact with the following medications: -amiodarone -amphotericin B -azathioprine -certain antiviral medicines for HIV or AIDS such as protease inhibitors (e.g., indinavir, ritonavir) and zidovudine -certain blood pressure medications such as benazepril, captopril, enalapril, fosinopril, lisinopril, moexipril, monopril, perindopril, quinapril, ramipril, trandolapril -certain cancer medications such as anthracyclines (e.g., daunorubicin, doxorubicin), busulfan, cytarabine, paclitaxel, pentostatin, tamoxifen, trastuzumab -certain diuretics such as chlorothiazide, chlorthalidone, hydrochlorothiazide, indapamide, metolazone -certain medicines that treat or prevent blood clots like warfarin -certain muscle relaxants such as succinylcholine -cyclosporine -etanercept -indomethacin -medicines to increase blood counts like filgrastim, pegfilgrastim, sargramostim -medicines used as general anesthesia -metronidazole -natalizumab This list may not describe all possible interactions. Give your health care provider a list of all the medicines, herbs, non-prescription drugs, or dietary supplements you use. Also tell them if you smoke, drink alcohol, or use illegal drugs. Some items may interact with your medicine. What should I watch for while using this medicine? Visit your doctor for checks on your progress. This drug may make you feel generally unwell. This is not uncommon, as chemotherapy can affect healthy cells as well as cancer cells. Report any side effects. Continue your course of treatment even though you feel ill unless your doctor tells you to stop. Drink water or other fluids as directed. Urinate often, even at night. In some cases, you  may be given  additional medicines to help with side effects. Follow all directions for their use. Call your doctor or health care professional for advice if you get a fever, chills or sore throat, or other symptoms of a cold or flu. Do not treat yourself. This drug decreases your body's ability to fight infections. Try to avoid being around people who are sick. This medicine may increase your risk to bruise or bleed. Call your doctor or health care professional if you notice any unusual bleeding. Be careful brushing and flossing your teeth or using a toothpick because you may get an infection or bleed more easily. If you have any dental work done, tell your dentist you are receiving this medicine. You may get drowsy or dizzy. Do not drive, use machinery, or do anything that needs mental alertness until you know how this medicine affects you. Do not become pregnant while taking this medicine or for 1 year after stopping it. Women should inform their doctor if they wish to become pregnant or think they might be pregnant. Men should not father a child while taking this medicine and for 4 months after stopping it. There is a potential for serious side effects to an unborn child. Talk to your health care professional or pharmacist for more information. Do not breast-feed an infant while taking this medicine. This medicine may interfere with the ability to have a child. This medicine has caused ovarian failure in some women. This medicine has caused reduced sperm counts in some men. You should talk with your doctor or health care professional if you are concerned about your fertility. If you are going to have surgery, tell your doctor or health care professional that you have taken this medicine. What side effects may I notice from receiving this medicine? Side effects that you should report to your doctor or health care professional as soon as possible: -allergic reactions like skin rash, itching or hives, swelling of the  face, lips, or tongue -low blood counts - this medicine may decrease the number of white blood cells, red blood cells and platelets. You may be at increased risk for infections and bleeding. -signs of infection - fever or chills, cough, sore throat, pain or difficulty passing urine -signs of decreased platelets or bleeding - bruising, pinpoint red spots on the skin, black, tarry stools, blood in the urine -signs of decreased red blood cells - unusually weak or tired, fainting spells, lightheadedness -breathing problems -dark urine -dizziness -palpitations -swelling of the ankles, feet, hands -trouble passing urine or change in the amount of urine -weight gain -yellowing of the eyes or skin Side effects that usually do not require medical attention (report to your doctor or health care professional if they continue or are bothersome): -changes in nail or skin color -hair loss -missed menstrual periods -mouth sores -nausea, vomiting This list may not describe all possible side effects. Call your doctor for medical advice about side effects. You may report side effects to FDA at 1-800-FDA-1088. Where should I keep my medicine? This drug is given in a hospital or clinic and will not be stored at home. NOTE: This sheet is a summary. It may not cover all possible information. If you have questions about this medicine, talk to your doctor, pharmacist, or health care provider.  2015, Elsevier/Gold Standard. (2012-03-16 16:22:58)

## 2015-01-01 NOTE — Progress Notes (Signed)
Adriamycin administered through a free dripping normal saline line. Positive blood return noted before, every 5 mL during, and after adriamycin administration. Patient tolerated well.

## 2015-01-02 ENCOUNTER — Ambulatory Visit: Payer: 59 | Admitting: Nurse Practitioner

## 2015-01-02 ENCOUNTER — Telehealth: Payer: Self-pay | Admitting: *Deleted

## 2015-01-02 NOTE — Telephone Encounter (Signed)
-----   Message from Lujean Amel, RN sent at 01/01/2015 12:47 PM EDT ----- Regarding: first time chemo  GM Contact: [phone] First time adriamycin/cytoxan.  Dr. Jana Hakim.

## 2015-01-02 NOTE — Telephone Encounter (Signed)
This RN spoke with pt today.  Overall she has issues- she did inadvertently take Claritin yesterday per misreading med instructions and was concerned " did I mess up my chemo ?"  This RN reassured her use of Claritin on day of chemo is not contraindicated- reviewed recommendation for Claritin is related to decreasing side effects of bone pain with the neulasta.  No other needs at this time.

## 2015-01-08 ENCOUNTER — Ambulatory Visit: Payer: 59

## 2015-01-08 ENCOUNTER — Other Ambulatory Visit: Payer: 59

## 2015-01-09 ENCOUNTER — Ambulatory Visit (HOSPITAL_BASED_OUTPATIENT_CLINIC_OR_DEPARTMENT_OTHER): Payer: 59 | Admitting: Nurse Practitioner

## 2015-01-09 ENCOUNTER — Encounter: Payer: Self-pay | Admitting: Nurse Practitioner

## 2015-01-09 ENCOUNTER — Other Ambulatory Visit (HOSPITAL_BASED_OUTPATIENT_CLINIC_OR_DEPARTMENT_OTHER): Payer: 59

## 2015-01-09 VITALS — BP 117/75 | HR 93 | Temp 98.7°F | Resp 18 | Ht 65.0 in | Wt 215.6 lb

## 2015-01-09 DIAGNOSIS — C50311 Malignant neoplasm of lower-inner quadrant of right female breast: Secondary | ICD-10-CM

## 2015-01-09 DIAGNOSIS — K088 Other specified disorders of teeth and supporting structures: Secondary | ICD-10-CM

## 2015-01-09 DIAGNOSIS — Z171 Estrogen receptor negative status [ER-]: Secondary | ICD-10-CM

## 2015-01-09 DIAGNOSIS — K0889 Other specified disorders of teeth and supporting structures: Secondary | ICD-10-CM | POA: Insufficient documentation

## 2015-01-09 LAB — COMPREHENSIVE METABOLIC PANEL (CC13)
ALT: 10 U/L (ref 0–55)
ANION GAP: 7 meq/L (ref 3–11)
AST: 8 U/L (ref 5–34)
Albumin: 3.7 g/dL (ref 3.5–5.0)
Alkaline Phosphatase: 74 U/L (ref 40–150)
BUN: 10.5 mg/dL (ref 7.0–26.0)
CHLORIDE: 103 meq/L (ref 98–109)
CO2: 27 meq/L (ref 22–29)
Calcium: 9.2 mg/dL (ref 8.4–10.4)
Creatinine: 0.8 mg/dL (ref 0.6–1.1)
Glucose: 117 mg/dl (ref 70–140)
Potassium: 4.4 mEq/L (ref 3.5–5.1)
SODIUM: 138 meq/L (ref 136–145)
Total Bilirubin: 0.35 mg/dL (ref 0.20–1.20)
Total Protein: 6.8 g/dL (ref 6.4–8.3)

## 2015-01-09 LAB — CBC WITH DIFFERENTIAL/PLATELET
BASO%: 0.2 % (ref 0.0–2.0)
BASOS ABS: 0 10*3/uL (ref 0.0–0.1)
EOS ABS: 0 10*3/uL (ref 0.0–0.5)
EOS%: 1.2 % (ref 0.0–7.0)
HEMATOCRIT: 33.8 % — AB (ref 34.8–46.6)
HGB: 10.5 g/dL — ABNORMAL LOW (ref 11.6–15.9)
LYMPH#: 0.9 10*3/uL (ref 0.9–3.3)
LYMPH%: 44.4 % (ref 14.0–49.7)
MCH: 23.8 pg — AB (ref 25.1–34.0)
MCHC: 31.1 g/dL — AB (ref 31.5–36.0)
MCV: 76.6 fL — ABNORMAL LOW (ref 79.5–101.0)
MONO#: 0.1 10*3/uL (ref 0.1–0.9)
MONO%: 5.4 % (ref 0.0–14.0)
NEUT#: 0.9 10*3/uL — ABNORMAL LOW (ref 1.5–6.5)
NEUT%: 48.8 % (ref 38.4–76.8)
PLATELETS: 132 10*3/uL — AB (ref 145–400)
RBC: 4.41 10*6/uL (ref 3.70–5.45)
RDW: 14.4 % (ref 11.2–14.5)
WBC: 1.9 10*3/uL — ABNORMAL LOW (ref 3.9–10.3)

## 2015-01-09 NOTE — Progress Notes (Signed)
[place]  [phone] [phone]) [phone]     ID: Kristie Bracewell Pineo-Wilson DOB: 1975-06-25  MR#: [id number]  YKZ#:[id number]  Patient Care Team: Donnamae Jude, MD as PCP - General (Obstetrics and Gynecology) Rolm Bookbinder, MD as Consulting Physician (General Surgery) Chauncey Cruel, MD as Consulting Physician (Oncology) Thea Silversmith, MD as Consulting Physician (Radiation Oncology) [name], RN as Registered Nurse Rockwell Germany, RN as Registered Nurse Holley Bouche, NP as Nurse Practitioner (Nurse Practitioner) PCP: Donnamae Jude, MD OTHER MD:  CHIEF COMPLAINT: Estrogen receptor negative breast cancer  CURRENT TREATMENT: Neoadjuvant chemotherapy   BREAST CANCER HISTORY: Azula herself noted a mass in her right breast sometime around April. Initially she thought it might be related to menstruation, but as it did not change and eventually became tender, she brought it to her physician's attention. On 11/20/2014 patient underwent bilateral diagnostic mammography with tomosynthesis and right breast ultrasonography at the [facility]. The breast density was category B. There was a hyperdense mass in the right lower inner quadrant associated with skin thickening. There was also a 5 mm nodule posteriorly at the 8:30 o'clock position in the right breast. There were several hyperdense nodules in the right axilla. On physical exam there was a firm fixed mass in the right breast at the 5:00 position. By ultrasound this was lobulated and appear to involve the skin. It measures up to 4.1 cm. There was no sonographic correlation to the 5 mm nodule seen in a different area of the right breast. The right axilla showed 3 hypervascular lymph nodes with prominent cortical thickening, measuring less than 1.5 cm.  On 11/21/2014 the patient underwent right breast biopsy (5:00 mass) and biopsy of one of the suspicious right axillary lymph nodes. 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  The patient's subsequent history is as detailed below  INTERVAL HISTORY: Idonna returns today for follow up of her breast cancer. Today is day 9, cycle 1 of cyclophosphamide and doxorubicin, with neulasta given on day 2 for granulocyte support.   REVIEW OF SYSTEMS: Ashwika denies fevers or chills. Her mild nausea was managed with antiemetics. She never vomited. She is moving her bowels well. Her appetite is stable despite taste changes. She denies mucositis symptoms, but does have an aching tooth on the lower left side that bothers her. She is rinsing with warm salt water and sometimes apple cider vinegar. This has not improved much so far. She had very mild pain from neulasta that resolved on its own. Her energy level is down but she is able to work through it. A detailed review of systems is otherwise stable.  PAST MEDICAL HISTORY: Past Medical History  Diagnosis Date  . 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 Breast cancer   . Hernia, umbilical   . Back pain   . Headache   . Anemia   . Hernia, umbilical   . Family history of breast cancer     PAST SURGICAL HISTORY: Past Surgical History  Procedure Laterality Date  . Portacath placement Right 12/16/2014    Procedure: INSERTION PORT-A-CATH WITH ULTRASOUND;  Surgeon: Rolm Bookbinder, MD;  Location: [facility];  Service: General;  Laterality: Right;    FAMILY HISTORY Family History  Problem Relation Age of Onset  . Mesothelioma Maternal Grandfather     asbestos exposure, died in his 15s  . Heart defect Sister 0  . Heart disease Paternal Aunt   . Heart disease Paternal Uncle   . Breast cancer Paternal Grandmother   . Diabetes Paternal  Grandmother   . Cancer Paternal Grandfather     NOS  . Colon cancer Other 38    MGMs brother with colon cancer  . Cancer Other     several of MGF's sisters with cancer NOS  . Liver cancer Other     MGF's brother   the patient's parents are living, her father being 8 and her mother 8 as of July 2016. The patient had no brothers. One sister died at age 48 from cardiac problems. The other sister is in good health. On the maternal side there is a history of colon cancer and an uncle age 62, liver cancer in a great uncle and mesothelioma in the maternal grandfather.  GYNECOLOGIC HISTORY:  Patient's last menstrual period was 12/03/2014. Menarche age 34. The patient is GX P2. She still having regular periods  SOCIAL HISTORY:  Lilyannah works in Therapist, art for [employer]. Her husband Antrell works for [employer]. The daughters are [place] and Lovie Macadamia, age 35 and 29. The patient attends a local [employer]: Not in place   HEALTH MAINTENANCE: Social History  Substance Use Topics  . Smoking status: Never Smoker   . Smokeless tobacco: Never Used  . Alcohol Use: Yes     Comment: rarely     Colonoscopy:  PAP: 2014  Bone density:  Lipid panel:  No Known Allergies  Current Outpatient Prescriptions  Medication Sig Dispense Refill  . cyclobenzaprine (FLEXERIL) 10 MG tablet Take 10 mg by mouth 3 (three) times daily as needed for muscle spasms.    Marland Kitchen dexamethasone (DECADRON) 4 MG tablet Take 2 tablets by mouth once a day on the day after chemotherapy and then take 2 tablets two times a day for 2 days. Take with food. 30 tablet 1  . ibuprofen (ADVIL,MOTRIN) 200 MG tablet Take 400 mg by mouth every 6 (six) hours as needed (for pain).     Marland Kitchen lidocaine-prilocaine (EMLA) cream Apply to affected area once 30 g 3  . LORazepam (ATIVAN) 0.5 MG tablet Take 1 tablet (0.5 mg total) by mouth at bedtime. 30 tablet 0  . naproxen (NAPROSYN) 500 MG tablet Take  1 tablet (500 mg total) by mouth 2 (two) times daily. 30 tablet 0  . ondansetron (ZOFRAN) 8 MG tablet Take 1 tablet (8 mg total) by mouth 2 (two) times daily as needed. Start on the third day after chemotherapy. 30 tablet 1  . oxyCODONE-acetaminophen (PERCOCET) 10-325 MG per tablet Take 1 tablet by mouth every 6 (six) hours as needed for pain. 10 tablet 0  . prochlorperazine (COMPAZINE) 10 MG tablet Take 1 tablet (10 mg total) by mouth every 6 (six) hours as needed (Nausea or vomiting). 30 tablet 1   No current facility-administered medications for this visit.    OBJECTIVE: Young African-American woman who appears well Filed Vitals:   01/09/15 1004  BP: 117/75  Pulse: 93  Temp: 98.7 F (37.1 C)  Resp: 18     Body mass index is 35.88 kg/(m^2).    ECOG FS:0 - Asymptomatic  Sclerae unicteric, pupils round and equal Oropharynx clear and moist-- no thrush or other lesions No cervical or supraclavicular adenopathy  Lungs no rales or rhonchi Heart regular rate and rhythm Abd soft, nontender, positive bowel sounds MSK no focal spinal tenderness, no upper extremity lymphedema Neuro: nonfocal, well oriented, appropriate affect Breasts: deferred  LAB RESULTS:  CMP     Component Value Date/Time   NA 138 01/09/2015 0951   NA 136 12/12/2014 1222   K 4.4 01/09/2015 0951   K 4.2 12/12/2014 1222   CL 102 12/12/2014 1222   CO2 27 01/09/2015 0951   CO2 25 12/12/2014 1222   GLUCOSE 117 01/09/2015 0951   GLUCOSE 98 12/12/2014 1222   BUN 10.5 01/09/2015 0951   BUN 14 12/12/2014 1222   CREATININE 0.8 01/09/2015 0951   CREATININE 0.69 12/12/2014 1222   CALCIUM 9.2 01/09/2015 0951   CALCIUM 9.3 12/12/2014 1222   PROT 6.8 01/09/2015 0951   ALBUMIN 3.7 01/09/2015 0951   AST 8 01/09/2015 0951   ALT 10 01/09/2015 0951   ALKPHOS 74 01/09/2015 0951   BILITOT 0.35 01/09/2015 0951   GFRNONAA >60 12/12/2014 1222   GFRAA >60 12/12/2014 1222    INo results found for: SPEP, UPEP  Lab Results    Component Value Date   WBC 1.9* 01/09/2015   NEUTROABS 0.9* 01/09/2015   HGB 10.5* 01/09/2015   HCT 33.8* 01/09/2015   MCV 76.6* 01/09/2015   PLT 132* 01/09/2015      Chemistry      Component Value Date/Time   NA 138 01/09/2015 0951   NA 136 12/12/2014 1222   K 4.4 01/09/2015 0951   K 4.2 12/12/2014 1222   CL 102 12/12/2014 1222   CO2 27 01/09/2015 0951   CO2 25 12/12/2014 1222   BUN 10.5 01/09/2015 0951   BUN 14 12/12/2014 1222   CREATININE 0.8 01/09/2015 0951   CREATININE 0.69 12/12/2014 1222      Component Value Date/Time   CALCIUM 9.2 01/09/2015 0951   CALCIUM 9.3 12/12/2014 1222   ALKPHOS 74 01/09/2015 0951   AST 8 01/09/2015 0951   ALT 10 01/09/2015 0951   BILITOT 0.35 01/09/2015 0951       No results found for: LABCA2  No components found for: EYCXK481  No results for input(s): INR in the last 168 hours.  Urinalysis    Component Value Date/Time   LABSPEC 1.020 05/06/2009 1439   PHURINE 7.0 05/06/2009 1439   GLUCOSEU NEGATIVE 05/06/2009 1439   HGBUR NEGATIVE 05/06/2009 1439   BILIRUBINUR NEGATIVE 05/06/2009 1439   KETONESUR NEGATIVE 05/06/2009 1439   PROTEINUR NEGATIVE 05/06/2009 1439   UROBILINOGEN 0.2 05/06/2009 1439   NITRITE NEGATIVE 05/06/2009 1439   LEUKOCYTESUR  05/06/2009 1439    NEGATIVE Biochemical Testing Only. Please order routine urinalysis from main lab if confirmatory testing is needed.    STUDIES: Dg Chest Port 1 View  12/16/2014   CLINICAL DATA:  Port-A-Cath placement.  EXAM: PORTABLE CHEST - 1 VIEW  COMPARISON:  Two-view chest x-ray  FINDINGS: Borderline cardiomegaly is exaggerated by low lung volumes. A right IJ Port-A-Cath is in place. The tip projects just above the cavoatrial junction. There is no pneumothorax. The lungs are clear. The visualized soft tissues and bony thorax are unremarkable.  IMPRESSION: 1. Interval placement of right IJ Port-A-Cath without radiographic evidence for complication. 2. Borderline cardiomegaly.    Electronically Signed   By: San Morelle M.D.   On: 12/16/2014 09:01   Mm Digital Diagnostic Bilat  12/12/2014   CLINICAL DATA:  Status post ultrasound-guided core biopsy of a mass the 4  o'clock location of the left breast 3 cm from the nipple. Status post ultrasound-guided core biopsy of a second right axillary lymph node.  EXAM: DIAGNOSTIC BILATERAL MAMMOGRAM POST ULTRASOUND BIOPSY x2  COMPARISON:  Previous exam(s).  FINDINGS: Mammographic images were obtained following ultrasound guided biopsy of mass in the 4 o'clock location of the left breast. A coil shaped tissue marker clip is identified in the lower outer quadrant of the left breast as expected. Tissue marker clip is in expected location following biopsy of the left breast.  Mammogram of the right axilla shows 2 small coil shaped clips following ultrasound-guided core biopsies of lower scratched axillary lymph nodes.  IMPRESSION: Tissue marker clips are in expected locations following biopsy.  Final Assessment: Post Procedure Mammograms for Marker Placement   Electronically Signed   By: Nolon Nations M.D.   On: 12/12/2014 15:39   Dg C-arm 1-60 Min-no Report  12/16/2014   CLINICAL DATA: breast cancer   C-ARM 1-60 MINUTES  Fluoroscopy was utilized by the requesting physician.  No radiographic  interpretation.    US Breast Ltd Uni Left Inc Axilla  12/12/2014   CLINICAL DATA:  Patient presents for second look ultrasound following MRI. Known right breast cancer.  EXAM: ULTRASOUND OF THE LEFT BREAST  COMPARISON:  Previous exam(s).  FINDINGS: On physical exam, I palpate no abnormality in the lower outer quadrant of the left breast.  Targeted ultrasound is performed, showing a hypoechoic mass in the 4 o'clock location of the left breast 3 cm from the nipple which measures 0.8 x 0.5 x 0.9 cm. Nodule demonstrates vascularity on Doppler evaluation. Just lateral to this mass there is a second small hypoechoic nodule in the 4 o'clock location 5 cm  from the nipple which measures 0.8 x 0.4 x 0.6 cm. This nodule demonstrates no increased vascularity on Doppler evaluation. On correlation with MRI in the nodule 3 cm from nipple is felt to correlate with the MRI abnormality. The second nodule in the 4 o'clock location 5 cm from nipple correlates with a nonenhancing mass seen on MRI.  IMPRESSION: Ultrasound-guided core biopsy is recommended of the mass in the 4 o'clock location of the left breast 3 cm from nipple.  RECOMMENDATION: Ultrasound-guided core biopsy is performed on the same day and is dictated separately.  I have discussed the findings and recommendations with the patient. Results were also provided in writing at the conclusion of the visit. If applicable, a reminder letter will be sent to the patient regarding the next appointment.  BI-RADS CATEGORY  4: Suspicious.   Electronically Signed   By: Nolon Nations M.D.   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 This was found to be concordant by Dr. Shon Hale.  When the patient returned for recent biopsies, the question of a mammographically detected right lateral right breast nodule was reviewed. In light of the minimal mammographic abnormality and lack of sonographic and MRI correlate, no further evaluation is felt to be necessary.  Pathology was discussed with the patient by telephone. She reported doing well after the biopsy with tenderness at both sites. Post biopsy instructions and care were reviewed and her questions were answered. She was encouraged to call [facility] for any  additional questions or concerns. She was recently diagnosed with right breast invasive ductal carcinoma and was seen at [facility]. The patient is instructed to follow her outlined treatment plan.  Pathology results reported by Susa Raring RN, BSN on December 15, 2014.   Electronically Signed   By: Nolon Nations M.D.   On: 12/15/2014 11:38   12/12/2014   ADDENDUM REPORT: 12/12/2014 15:37  ADDENDUM: Biopsy is performed of the mass in the 4 o'clock location 3 cm from the nipple.   Electronically Signed   By: Nolon Nations M.D.   On: 12/12/2014 15:37   12/15/2014   CLINICAL DATA:  Left breast nodule detected on MRI. Known right breast cancer.  EXAM: ULTRASOUND GUIDED LEFT BREAST CORE NEEDLE BIOPSY  COMPARISON:  Previous exam(s).  FINDINGS: I met with the patient and we discussed the procedure of ultrasound-guided biopsy, including benefits and alternatives. We discussed the high likelihood of a successful procedure. We discussed the risks of the procedure, including infection, bleeding, tissue injury, clip migration, and inadequate sampling. Informed written consent was given. The usual time-out protocol was performed immediately prior to the procedure.  Using sterile technique and 2% Lidocaine as local anesthetic, under direct ultrasound visualization, a 14 gauge spring-loaded device was used to perform biopsy of nodule in the 4 o'clock location of the left breast 5 cm from the nipple using a lateral approach. At the conclusion of the procedure a coil shaped tissue marker clip was deployed into the biopsy cavity. Follow up 2 view mammogram was performed and dictated separately.  IMPRESSION: Ultrasound guided biopsy of left breast mass. No apparent complications.  Electronically Signed: By: Nolon Nations M.D. 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 This was found to be concordant by Dr. Shon Hale. When the patient returned for recent biopsies, the question of a mammographically detected right lateral right breast nodule was reviewed. In light of the minimal mammographic abnormality and lack of sonographic and MRI correlate, no further evaluation is felt to be necessary.  Pathology was discussed with the patient by telephone. She reported doing well after the biopsy with tenderness at both sites. Post biopsy instructions and care were reviewed and her questions were answered. She was encouraged to call [facility] for any additional questions or concerns. She was recently diagnosed with right breast invasive ductal carcinoma and was seen at [facility]. The patient is instructed to follow her outlined treatment plan.  Pathology results reported by Susa Raring RN, BSN on December 15, 2014.   Electronically Signed   By: Nolon Nations M.D.   On: 12/15/2014 11:39   12/15/2014   CLINICAL DATA:  Patient returns for biopsy of right axillary lymph node. Previous ultrasound-guided core biopsy showed benign lymph node tissue but was felt to be discordant. MRI confirms suspicious nature of several right axillary lymph nodes.  EXAM: ULTRASOUND GUIDED RIGHT BREAST CORE NEEDLE BIOPSY  COMPARISON:  Previous exam(s).  FINDINGS: Initially ultrasound is performed, confirming presence of at least 3 morphologically abnormal is lymph nodes in the right axilla. The lower most node is marked with a tissue marker clip following recent biopsy. A node immediately above  this previously biopsied lymph node was selected for biopsy site.  I met with the patient and we discussed the procedure of ultrasound-guided biopsy, including benefits and alternatives. We discussed the high likelihood of a successful procedure. We discussed the risks of the procedure, including infection, bleeding, tissue injury, clip migration, and inadequate sampling. Informed written consent  was given. The usual time-out protocol was performed immediately prior to the procedure.  Using sterile technique and 2% Lidocaine as local anesthetic, under direct ultrasound visualization, a 14 gauge spring-loaded device was used to perform biopsy of enlarged right axillary lymph node just superior to the previously biopsied node using a lateral approach. At the conclusion of the procedure a HydroMARK was deployed into the biopsy cavity. Follow up 2 view mammogram was performed and dictated separately.  IMPRESSION: Ultrasound guided biopsy of right axillary node. No apparent complications.  Electronically Signed: By: Nolon Nations M.D. On: 12/12/2014 15:33    ASSESSMENT: 39 y.o.  woman s/p Right breast biopsy 11/21/2014 for a clinical T2 NX, stage 2 invasive ductal carcinoma, grade 2 or 3, estrogen and progesterone receptor negative, HER-2 equivocal, with an Mib-1 of 20%  (a) biopsy of a suspicious axillary lymph node same day was negative, but discordant  (1) neoadjuvant chemotherapy will consist of cyclophosphamide and doxorubicin in dose dense fashion 4, with onpro support, to be followed by paclitaxel weekly 12  (a) if the patient's tumor proves to be HER-2 positive on review, trastuzumab and pertuzumab will be added with the Taxol treatments  (2) genetics testing pending  (3) definitive surgery depending on genetics result, to follow chemotherapy  (4) radiation to follow surgery  PLAN: Derisha tolerated her first cycle of chemotherapy remarkably well. The labs were reviewed in detail and were entirely stable. She managed her symptoms appropriately.   The pain in her mouth is not mucositis, but related to a tooth that will require dental work after chemotherapy is completed. We can try magic mouthwash with lidocaine for now to see if this is effective. She has norco to use in cases of extreme pain.   Twila will return in 1 week for labs and cycle 2 of cyclophosphamide and  doxorubicin. She understands and agrees with this plan. She knows the goal of treatment in her case is cure. She has been encouraged to call with any issues that might arise before her next visit here.   Laurie Panda, NP   01/09/2015 10:59 AM

## 2015-01-15 ENCOUNTER — Encounter (HOSPITAL_COMMUNITY): Payer: Self-pay

## 2015-01-16 ENCOUNTER — Other Ambulatory Visit: Payer: Self-pay | Admitting: Oncology

## 2015-01-16 ENCOUNTER — Encounter: Payer: Self-pay | Admitting: *Deleted

## 2015-01-16 ENCOUNTER — Ambulatory Visit (HOSPITAL_BASED_OUTPATIENT_CLINIC_OR_DEPARTMENT_OTHER): Payer: 59 | Admitting: Nurse Practitioner

## 2015-01-16 ENCOUNTER — Other Ambulatory Visit (HOSPITAL_BASED_OUTPATIENT_CLINIC_OR_DEPARTMENT_OTHER): Payer: 59

## 2015-01-16 ENCOUNTER — Encounter: Payer: Self-pay | Admitting: Nurse Practitioner

## 2015-01-16 ENCOUNTER — Ambulatory Visit (HOSPITAL_BASED_OUTPATIENT_CLINIC_OR_DEPARTMENT_OTHER): Payer: 59

## 2015-01-16 VITALS — BP 111/71 | HR 72 | Temp 98.4°F | Resp 18 | Wt 215.9 lb

## 2015-01-16 DIAGNOSIS — C50311 Malignant neoplasm of lower-inner quadrant of right female breast: Secondary | ICD-10-CM

## 2015-01-16 DIAGNOSIS — Z171 Estrogen receptor negative status [ER-]: Secondary | ICD-10-CM

## 2015-01-16 DIAGNOSIS — Z5111 Encounter for antineoplastic chemotherapy: Secondary | ICD-10-CM | POA: Diagnosis not present

## 2015-01-16 DIAGNOSIS — R202 Paresthesia of skin: Secondary | ICD-10-CM

## 2015-01-16 LAB — CBC WITH DIFFERENTIAL/PLATELET
BASO%: 0.2 % (ref 0.0–2.0)
Basophils Absolute: 0 10*3/uL (ref 0.0–0.1)
EOS%: 0 % (ref 0.0–7.0)
Eosinophils Absolute: 0 10*3/uL (ref 0.0–0.5)
HEMATOCRIT: 33.9 % — AB (ref 34.8–46.6)
HGB: 10.5 g/dL — ABNORMAL LOW (ref 11.6–15.9)
LYMPH#: 1.9 10*3/uL (ref 0.9–3.3)
LYMPH%: 32.5 % (ref 14.0–49.7)
MCH: 24.4 pg — ABNORMAL LOW (ref 25.1–34.0)
MCHC: 31 g/dL — AB (ref 31.5–36.0)
MCV: 78.8 fL — ABNORMAL LOW (ref 79.5–101.0)
MONO#: 0.5 10*3/uL (ref 0.1–0.9)
MONO%: 9.4 % (ref 0.0–14.0)
NEUT%: 57.9 % (ref 38.4–76.8)
NEUTROS ABS: 3.3 10*3/uL (ref 1.5–6.5)
PLATELETS: 299 10*3/uL (ref 145–400)
RBC: 4.3 10*6/uL (ref 3.70–5.45)
RDW: 14.8 % — ABNORMAL HIGH (ref 11.2–14.5)
WBC: 5.8 10*3/uL (ref 3.9–10.3)

## 2015-01-16 LAB — COMPREHENSIVE METABOLIC PANEL (CC13)
ALBUMIN: 3.9 g/dL (ref 3.5–5.0)
ALK PHOS: 85 U/L (ref 40–150)
ALT: 13 U/L (ref 0–55)
ANION GAP: 7 meq/L (ref 3–11)
AST: 11 U/L (ref 5–34)
BILIRUBIN TOTAL: 0.21 mg/dL (ref 0.20–1.20)
BUN: 11.8 mg/dL (ref 7.0–26.0)
CALCIUM: 9.6 mg/dL (ref 8.4–10.4)
CO2: 28 mEq/L (ref 22–29)
CREATININE: 0.8 mg/dL (ref 0.6–1.1)
Chloride: 105 mEq/L (ref 98–109)
EGFR: >90 mL/min/{1.73_m2} (ref >90)
Glucose: 95 mg/dl (ref 70–140)
Potassium: 4.2 mEq/L (ref 3.5–5.1)
Sodium: 140 mEq/L (ref 136–145)
TOTAL PROTEIN: 7.1 g/dL (ref 6.4–8.3)

## 2015-01-16 MED ORDER — PALONOSETRON HCL INJECTION 0.25 MG/5ML
0.2500 mg | Freq: Once | INTRAVENOUS | Status: AC
Start: 2015-01-16 — End: 2015-01-16
  Administered 2015-01-16: 0.25 mg via INTRAVENOUS

## 2015-01-16 MED ORDER — CYCLOPHOSPHAMIDE CHEMO INJECTION 1 GM
600.0000 mg/m2 | Freq: Once | INTRAMUSCULAR | Status: AC
  Administered 2015-01-16: 1280 mg via INTRAVENOUS
  Filled 2015-01-16: qty 64

## 2015-01-16 MED ORDER — SODIUM CHLORIDE 0.9 % IV SOLN
Freq: Once | INTRAVENOUS | Status: AC
  Administered 2015-01-16: 11:00:00 via INTRAVENOUS
  Filled 2015-01-16: qty 5

## 2015-01-16 MED ORDER — HEPARIN SOD (PORK) LOCK FLUSH 100 UNIT/ML IV SOLN
500.0000 [IU] | Freq: Once | INTRAVENOUS | Status: AC | PRN
  Administered 2015-01-16: 500 [IU]
  Filled 2015-01-16: qty 5

## 2015-01-16 MED ORDER — PEGFILGRASTIM 6 MG/0.6ML ~~LOC~~ PSKT
6.0000 mg | PREFILLED_SYRINGE | Freq: Once | SUBCUTANEOUS | Status: AC
  Administered 2015-01-16: 6 mg via SUBCUTANEOUS
  Filled 2015-01-16: qty 0.6

## 2015-01-16 MED ORDER — PALONOSETRON HCL INJECTION 0.25 MG/5ML
INTRAVENOUS | Status: AC
  Filled 2015-01-16: qty 5

## 2015-01-16 MED ORDER — DOXORUBICIN HCL CHEMO IV INJECTION 2 MG/ML
60.0000 mg/m2 | Freq: Once | INTRAVENOUS | Status: AC
  Administered 2015-01-16: 128 mg via INTRAVENOUS
  Filled 2015-01-16: qty 64

## 2015-01-16 MED ORDER — SODIUM CHLORIDE 0.9 % IJ SOLN
10.0000 mL | INTRAMUSCULAR | Status: DC | PRN
  Administered 2015-01-16: 10 mL
  Filled 2015-01-16: qty 10

## 2015-01-16 MED ORDER — SODIUM CHLORIDE 0.9 % IV SOLN
Freq: Once | INTRAVENOUS | Status: AC
  Administered 2015-01-16: 11:00:00 via INTRAVENOUS

## 2015-01-16 NOTE — Progress Notes (Signed)
[name]  [phone] [phone]) [phone]     ID: Susan Wilson DOB: 10-17-1975  MR#: [id number]  TIW#:[id number]  Patient Care Team: Donnamae Jude, MD as PCP - General (Obstetrics and Gynecology) Rolm Bookbinder, MD as Consulting Physician (General Surgery) Chauncey Cruel, MD as Consulting Physician (Oncology) Thea Silversmith, MD as Consulting Physician (Radiation Oncology) [name], RN as Registered Nurse Rockwell Germany, RN as Registered Nurse Holley Bouche, NP as Nurse Practitioner (Nurse Practitioner) PCP: Donnamae Jude, MD OTHER MD:  CHIEF COMPLAINT: Estrogen receptor negative breast cancer  CURRENT TREATMENT: Neoadjuvant chemotherapy   BREAST CANCER HISTORY: Susan Wilson herself noted a mass in her right breast sometime around April. Initially she thought it might be related to menstruation, but as it did not change and eventually became tender, she brought it to her physician's attention. On 11/20/2014 patient underwent bilateral diagnostic mammography with tomosynthesis and right breast ultrasonography at the [facility] Center. The breast density was category B. There was a hyperdense mass in the right lower inner quadrant associated with skin thickening. There was also a 5 mm nodule posteriorly at the 8:30 o'clock position in the right breast. There were several hyperdense nodules in the right axilla. On physical exam there was a firm fixed mass in the right breast at the 5:00 position. By ultrasound this was lobulated and appear to involve the skin. It measures up to 4.1 cm. There was no sonographic correlation to the 5 mm nodule seen in a different area of the right breast. The right axilla showed 3 hypervascular lymph nodes with prominent cortical thickening, measuring less than 1.5 cm.  On 11/21/2014 the patient underwent right breast biopsy (5:00 mass) and biopsy of one of the suspicious right axillary lymph nodes. 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  The patient's subsequent history is as detailed below  INTERVAL HISTORY: [name] returns today for follow up of her breast cancer, accompanied by her sister. Today is day 1, cycle 2 of cyclophosphamide and doxorubicin, with neulasta given on day 2 for granulocyte support.   REVIEW OF SYSTEMS: [name] is doing well today. She denies fevers, chills, or changes in bowel or bladder habits. She has some mild nausea managed with compazine PRN. She has some intermittent ankle swelling that resolves with elevation. New this past week is complaints of tingling to her extremities. The right hand is greater than the left and she is wearing a wrist brace that helps this. Her bilateral toes tingle, but get better after her ankles stop swelling. She is eating and drinking well. She using aleve for pain PRN and has a history of a bulging disk. A detailed review of systems is otherwise stable.  PAST MEDICAL HISTORY: Past Medical History  Diagnosis Date  . 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 Breast cancer   . Hernia, umbilical   . Back pain   . Headache   . Anemia   . Hernia, umbilical   . Family history of breast cancer     PAST SURGICAL HISTORY: Past Surgical History  Procedure Laterality Date  . Portacath placement Right 12/16/2014    Procedure: INSERTION PORT-A-CATH WITH ULTRASOUND;  Surgeon: Rolm Bookbinder, MD;  Location: [facility];  Service: General;  Laterality: Right;    FAMILY HISTORY Family History  Problem Relation Age of Onset  . Mesothelioma Maternal Grandfather     asbestos exposure, died in his 49s  . Heart defect Sister 0  . Heart disease Paternal Aunt   . Heart disease Paternal Uncle     . Breast cancer Paternal Grandmother   . Diabetes Paternal Grandmother   . Cancer Paternal Grandfather     NOS  . Colon cancer Other 71    MGMs brother with colon cancer  . Cancer Other     several of MGF's sisters with cancer NOS  . Liver cancer Other     MGF's brother   the patient's parents are living, her father being 16 and her mother 26 as of July 2016. The patient had no brothers. One sister died at age 75 from cardiac problems. The other sister is in good health. On the maternal side there is a history of colon cancer and an uncle age 37, liver cancer in a great uncle and mesothelioma in the maternal grandfather.  GYNECOLOGIC HISTORY:  No LMP recorded. Menarche age 39. The patient is GX P2. She still having regular periods  SOCIAL HISTORY:  [name] works in Therapist, art for [employer]. Her husband Antrell works for [employer]. The daughters are [place] and Lovie Macadamia, age 46 and 41. The patient attends a local [place]: Not in place   HEALTH MAINTENANCE: Social History  Substance Use Topics  . Smoking status: Never Smoker   . Smokeless tobacco: Never Used  . Alcohol Use: Yes     Comment: rarely     Colonoscopy:  PAP: 2014  Bone density:  Lipid panel:  No Known Allergies  Current Outpatient Prescriptions  Medication Sig Dispense Refill  . dexamethasone (DECADRON) 4 MG tablet Take 2 tablets by mouth once a day on the day after chemotherapy and then take 2 tablets two times a day for 2 days. Take with food. 30 tablet 1  . lidocaine-prilocaine (EMLA) cream Apply to affected area once 30 g 3  . LORazepam (ATIVAN) 0.5 MG tablet Take 1 tablet (0.5 mg total) by mouth at bedtime. 30 tablet 0  . ondansetron (ZOFRAN) 8 MG tablet Take 1 tablet (8 mg total) by mouth 2 (two) times daily as needed. Start on the third day after chemotherapy. 30 tablet 1  . oxyCODONE-acetaminophen (PERCOCET) 10-325 MG per tablet Take 1 tablet by  mouth every 6 (six) hours as needed for pain. 10 tablet 0  . prochlorperazine (COMPAZINE) 10 MG tablet Take 1 tablet (10 mg total) by mouth every 6 (six) hours as needed (Nausea or vomiting). 30 tablet 1  . cyclobenzaprine (FLEXERIL) 10 MG tablet Take 10 mg by mouth 3 (three) times daily as needed for muscle spasms.    Marland Kitchen ibuprofen (ADVIL,MOTRIN) 200 MG tablet Take 400 mg by mouth every 6 (six) hours as needed (for pain).     . naproxen (NAPROSYN) 500 MG tablet Take 1 tablet (500 mg total) by mouth 2 (two) times daily. (Patient not taking: Reported on 01/16/2015) 30 tablet 0   No current facility-administered medications for this visit.    OBJECTIVE: Young African-American woman who appears well Filed Vitals:   01/16/15 0944  BP: 111/71  Pulse: 72  Temp: 98.4 F (36.9 C)  Resp: 18     Body mass index is 35.93 kg/(m^2).    ECOG FS:0 - Asymptomatic  Skin: warm, dry  HEENT: sclerae  anicteric, conjunctivae pink, oropharynx clear. No thrush or mucositis.  Lymph Nodes: No cervical or supraclavicular lymphadenopathy  Lungs: clear to auscultation bilaterally, no rales, wheezes, or rhonci  Heart: regular rate and rhythm  Abdomen: round, soft, non tender, positive bowel sounds  Musculoskeletal: No focal spinal tenderness, no peripheral edema  Neuro: non focal, well oriented, positive affect  Breasts: deferred  LAB RESULTS:  CMP     Component Value Date/Time   NA 140 01/16/2015 0922   NA 136 12/12/2014 1222   K 4.2 01/16/2015 0922   K 4.2 12/12/2014 1222   CL 102 12/12/2014 1222   CO2 28 01/16/2015 0922   CO2 25 12/12/2014 1222   GLUCOSE 95 01/16/2015 0922   GLUCOSE 98 12/12/2014 1222   BUN 11.8 01/16/2015 0922   BUN 14 12/12/2014 1222   CREATININE 0.8 01/16/2015 0922   CREATININE 0.69 12/12/2014 1222   CALCIUM 9.6 01/16/2015 0922   CALCIUM 9.3 12/12/2014 1222   PROT 7.1 01/16/2015 0922   ALBUMIN 3.9 01/16/2015 0922   AST 11 01/16/2015 0922   ALT 13 01/16/2015 0922   ALKPHOS 85  01/16/2015 0922   BILITOT 0.21 01/16/2015 0922   GFRNONAA >60 12/12/2014 1222   GFRAA >60 12/12/2014 1222    INo results found for: SPEP, UPEP  Lab Results  Component Value Date   WBC 5.8 01/16/2015   NEUTROABS 3.3 01/16/2015   HGB 10.5* 01/16/2015   HCT 33.9* 01/16/2015   MCV 78.8* 01/16/2015   PLT 299 01/16/2015      Chemistry      Component Value Date/Time   NA 140 01/16/2015 0922   NA 136 12/12/2014 1222   K 4.2 01/16/2015 0922   K 4.2 12/12/2014 1222   CL 102 12/12/2014 1222   CO2 28 01/16/2015 0922   CO2 25 12/12/2014 1222   BUN 11.8 01/16/2015 0922   BUN 14 12/12/2014 1222   CREATININE 0.8 01/16/2015 0922   CREATININE 0.69 12/12/2014 1222      Component Value Date/Time   CALCIUM 9.6 01/16/2015 0922   CALCIUM 9.3 12/12/2014 1222   ALKPHOS 85 01/16/2015 0922   AST 11 01/16/2015 0922   ALT 13 01/16/2015 0922   BILITOT 0.21 01/16/2015 0922       No results found for: LABCA2  No components found for: LABCA125  No results for input(s): INR in the last 168 hours.  Urinalysis    Component Value Date/Time   LABSPEC 1.020 05/06/2009 1439   PHURINE 7.0 05/06/2009 1439   GLUCOSEU NEGATIVE 05/06/2009 1439   HGBUR NEGATIVE 05/06/2009 1439   BILIRUBINUR NEGATIVE 05/06/2009 1439   KETONESUR NEGATIVE 05/06/2009 1439   PROTEINUR NEGATIVE 05/06/2009 1439   UROBILINOGEN 0.2 05/06/2009 1439   NITRITE NEGATIVE 05/06/2009 1439   LEUKOCYTESUR  05/06/2009 1439    NEGATIVE Biochemical Testing Only. Please order routine urinalysis from main lab if confirmatory testing is needed.    STUDIES: No results found.  ASSESSMENT: 39 y.o. [place] woman s/p Right breast biopsy 11/21/2014 for a clinical T2 NX, stage 2 invasive ductal carcinoma, grade 2 or 3, estrogen and progesterone receptor negative, HER-2 equivocal, with an Mib-1 of 20%  (a) biopsy of a suspicious axillary lymph node same day was negative, but discordant  (1) neoadjuvant chemotherapy will consist of  cyclophosphamide and doxorubicin in dose dense fashion 4, with onpro support, to be followed by paclitaxel weekly 12  (a) if the patient's tumor proves to be HER-2 positive on review, trastuzumab and pertuzumab  will be added with the Taxol treatments  (2) genetics testing pending  (3) definitive surgery depending on genetics result, to follow chemotherapy  (4) radiation to follow surgery  PLAN: Jozee is doing well today. The labs were reviewed in detail and were entirely stable. She will proceed with cycle 2 of cyclophosphamide and doxorubicin as planned today. She will pay special attention to the tingling in her extremities. The chemotherapies that she is on do not typically cause peripheral neuropathy symptoms. It is possible that the tingling to her feet is from intermittent ankle swelling, and her right hand could be suffering from carpal tunnel as she types all day for work.  Daylani will return in 1 week for labs and a nadir visit . She understands and agrees with this plan. She knows the goal of treatment in her case is cure. She has been encouraged to call with any issues that might arise before her next visit here.   Laurie Panda, NP   01/16/2015 10:35 AM

## 2015-01-16 NOTE — Patient Instructions (Signed)
[facility] Discharge Instructions for Patients Receiving Chemotherapy  Today you received the following chemotherapy agents adriamycin and cytoxan  To help prevent nausea and vomiting after your treatment, we encourage you to take your nausea medication.   If you develop nausea and vomiting that is not controlled by your nausea medication, call the clinic.   BELOW ARE SYMPTOMS THAT SHOULD BE REPORTED IMMEDIATELY:  *FEVER GREATER THAN 100.5 F  *CHILLS WITH OR WITHOUT FEVER  NAUSEA AND VOMITING THAT IS NOT CONTROLLED WITH YOUR NAUSEA MEDICATION  *UNUSUAL SHORTNESS OF BREATH  *UNUSUAL BRUISING OR BLEEDING  TENDERNESS IN MOUTH AND THROAT WITH OR WITHOUT PRESENCE OF ULCERS  *URINARY PROBLEMS  *BOWEL PROBLEMS  UNUSUAL RASH Items with * indicate a potential emergency and should be followed up as soon as possible.  Feel free to call the clinic you have any questions or concerns. The clinic phone number is [phone].  Please show the [place] [facility] at check-in to the Emergency Department and triage nurse. Doxorubicin injection What is this medicine? DOXORUBICIN (dox oh ROO bi sin) is a chemotherapy drug. It is used to treat many kinds of cancer like Hodgkin's disease, leukemia, non-Hodgkin's lymphoma, neuroblastoma, sarcoma, and Wilms' tumor. It is also used to treat bladder cancer, breast cancer, lung cancer, ovarian cancer, stomach cancer, and thyroid cancer. This medicine may be used for other purposes; ask your health care provider or pharmacist if you have questions. COMMON BRAND NAME(S): Adriamycin, Adriamycin PFS, Adriamycin RDF, Rubex What should I tell my health care provider before I take this medicine? They need to know if you have any of these conditions: -blood disorders -heart disease, recent heart attack -infection (especially a virus infection such as chickenpox, cold sores, or herpes) -irregular heartbeat -liver disease -recent or ongoing  radiation therapy -an unusual or allergic reaction to doxorubicin, other chemotherapy agents, other medicines, foods, dyes, or preservatives -pregnant or trying to get pregnant -breast-feeding How should I use this medicine? This drug is given as an infusion into a vein. It is administered in a hospital or clinic by a specially trained health care professional. If you have pain, swelling, burning or any unusual feeling around the site of your injection, tell your health care professional right away. Talk to your pediatrician regarding the use of this medicine in children. Special care may be needed. Overdosage: If you think you have taken too much of this medicine contact a poison control center or emergency room at once. NOTE: This medicine is only for you. Do not share this medicine with others. What if I miss a dose? It is important not to miss your dose. Call your doctor or health care professional if you are unable to keep an appointment. What may interact with this medicine? Do not take this medicine with any of the following medications: -cisapride -droperidol -halofantrine -pimozide -zidovudine This medicine may also interact with the following medications: -chloroquine -chlorpromazine -clarithromycin -cyclophosphamide -cyclosporine -erythromycin -medicines for depression, anxiety, or psychotic disturbances -medicines for irregular heart beat like amiodarone, bepridil, dofetilide, encainide, flecainide, propafenone, quinidine -medicines for seizures like ethotoin, fosphenytoin, phenytoin -medicines for nausea, vomiting like dolasetron, ondansetron, palonosetron -medicines to increase blood counts like filgrastim, pegfilgrastim, sargramostim -methadone -methotrexate -pentamidine -progesterone -vaccines -verapamil Talk to your doctor or health care professional before taking any of these medicines: -acetaminophen -aspirin -ibuprofen -ketoprofen -naproxen This list may  not describe all possible interactions. Give your health care provider a list of all the medicines, herbs, non-prescription drugs, or  dietary supplements you use. Also tell them if you smoke, drink alcohol, or use illegal drugs. Some items may interact with your medicine. What should I watch for while using this medicine? Your condition will be monitored carefully while you are receiving this medicine. You will need important blood work done while you are taking this medicine. This drug may make you feel generally unwell. This is not uncommon, as chemotherapy can affect healthy cells as well as cancer cells. Report any side effects. Continue your course of treatment even though you feel ill unless your doctor tells you to stop. Your urine may turn red for a few days after your dose. This is not blood. If your urine is dark or brown, call your doctor. In some cases, you may be given additional medicines to help with side effects. Follow all directions for their use. Call your doctor or health care professional for advice if you get a fever, chills or sore throat, or other symptoms of a cold or flu. Do not treat yourself. This drug decreases your body's ability to fight infections. Try to avoid being around people who are sick. This medicine may increase your risk to bruise or bleed. Call your doctor or health care professional if you notice any unusual bleeding. Be careful brushing and flossing your teeth or using a toothpick because you may get an infection or bleed more easily. If you have any dental work done, tell your dentist you are receiving this medicine. Avoid taking products that contain aspirin, acetaminophen, ibuprofen, naproxen, or ketoprofen unless instructed by your doctor. These medicines may hide a fever. Men and women of childbearing age should use effective birth control methods while using taking this medicine. Do not become pregnant while taking this medicine. There is a potential for  serious side effects to an unborn child. Talk to your health care professional or pharmacist for more information. Do not breast-feed an infant while taking this medicine. Do not let others touch your urine or other body fluids for 5 days after each treatment with this medicine. Caregivers should wear latex gloves to avoid touching body fluids during this time. There is a maximum amount of this medicine you should receive throughout your life. The amount depends on the medical condition being treated and your overall health. Your doctor will watch how much of this medicine you receive in your lifetime. Tell your doctor if you have taken this medicine before. What side effects may I notice from receiving this medicine? Side effects that you should report to your doctor or health care professional as soon as possible: -allergic reactions like skin rash, itching or hives, swelling of the face, lips, or tongue -low blood counts - this medicine may decrease the number of white blood cells, red blood cells and platelets. You may be at increased risk for infections and bleeding. -signs of infection - fever or chills, cough, sore throat, pain or difficulty passing urine -signs of decreased platelets or bleeding - bruising, pinpoint red spots on the skin, black, tarry stools, blood in the urine -signs of decreased red blood cells - unusually weak or tired, fainting spells, lightheadedness -breathing problems -chest pain -fast, irregular heartbeat -mouth sores -nausea, vomiting -pain, swelling, redness at site where injected -pain, tingling, numbness in the hands or feet -swelling of ankles, feet, or hands -unusual bleeding or bruising Side effects that usually do not require medical attention (report to your doctor or health care professional if they continue or are bothersome): -diarrhea -facial  flushing -hair loss -loss of appetite -missed menstrual periods -nail discoloration or damage -red or  watery eyes -red colored urine -stomach upset This list may not describe all possible side effects. Call your doctor for medical advice about side effects. You may report side effects to FDA at 1-800-FDA-1088. Where should I keep my medicine? This drug is given in a hospital or clinic and will not be stored at home. NOTE: This sheet is a summary. It may not cover all possible information. If you have questions about this medicine, talk to your doctor, pharmacist, or health care provider.  2015, [employer]. (2012-08-28 09:54:34) Cyclophosphamide injection What is this medicine? CYCLOPHOSPHAMIDE (sye kloe FOSS fa mide) is a chemotherapy drug. It slows the growth of cancer cells. This medicine is used to treat many types of cancer like lymphoma, myeloma, leukemia, breast cancer, and ovarian cancer, to name a few. This medicine may be used for other purposes; ask your health care provider or pharmacist if you have questions. COMMON BRAND NAME(S): Cytoxan, Neosar What should I tell my health care provider before I take this medicine? They need to know if you have any of these conditions: -blood disorders -history of other chemotherapy -infection -kidney disease -liver disease -recent or ongoing radiation therapy -tumors in the bone marrow -an unusual or allergic reaction to cyclophosphamide, other chemotherapy, other medicines, foods, dyes, or preservatives -pregnant or trying to get pregnant -breast-feeding How should I use this medicine? This drug is usually given as an injection into a vein or muscle or by infusion into a vein. It is administered in a hospital or clinic by a specially trained health care professional. Talk to your pediatrician regarding the use of this medicine in children. Special care may be needed. Overdosage: If you think you have taken too much of this medicine contact a poison control center or emergency room at once. NOTE: This medicine is only for you.  Do not share this medicine with others. What if I miss a dose? It is important not to miss your dose. Call your doctor or health care professional if you are unable to keep an appointment. What may interact with this medicine? This medicine may interact with the following medications: -amiodarone -amphotericin B -azathioprine -certain antiviral medicines for HIV or AIDS such as protease inhibitors (e.g., indinavir, ritonavir) and zidovudine -certain blood pressure medications such as benazepril, captopril, enalapril, fosinopril, lisinopril, moexipril, monopril, perindopril, quinapril, ramipril, trandolapril -certain cancer medications such as anthracyclines (e.g., daunorubicin, doxorubicin), busulfan, cytarabine, paclitaxel, pentostatin, tamoxifen, trastuzumab -certain diuretics such as chlorothiazide, chlorthalidone, hydrochlorothiazide, indapamide, metolazone -certain medicines that treat or prevent blood clots like warfarin -certain muscle relaxants such as succinylcholine -cyclosporine -etanercept -indomethacin -medicines to increase blood counts like filgrastim, pegfilgrastim, sargramostim -medicines used as general anesthesia -metronidazole -natalizumab This list may not describe all possible interactions. Give your health care provider a list of all the medicines, herbs, non-prescription drugs, or dietary supplements you use. Also tell them if you smoke, drink alcohol, or use illegal drugs. Some items may interact with your medicine. What should I watch for while using this medicine? Visit your doctor for checks on your progress. This drug may make you feel generally unwell. This is not uncommon, as chemotherapy can affect healthy cells as well as cancer cells. Report any side effects. Continue your course of treatment even though you feel ill unless your doctor tells you to stop. Drink water or other fluids as directed. Urinate often, even at night. In some cases, you  may be given  additional medicines to help with side effects. Follow all directions for their use. Call your doctor or health care professional for advice if you get a fever, chills or sore throat, or other symptoms of a cold or flu. Do not treat yourself. This drug decreases your body's ability to fight infections. Try to avoid being around people who are sick. This medicine may increase your risk to bruise or bleed. Call your doctor or health care professional if you notice any unusual bleeding. Be careful brushing and flossing your teeth or using a toothpick because you may get an infection or bleed more easily. If you have any dental work done, tell your dentist you are receiving this medicine. You may get drowsy or dizzy. Do not drive, use machinery, or do anything that needs mental alertness until you know how this medicine affects you. Do not become pregnant while taking this medicine or for 1 year after stopping it. Women should inform their doctor if they wish to become pregnant or think they might be pregnant. Men should not father a child while taking this medicine and for 4 months after stopping it. There is a potential for serious side effects to an unborn child. Talk to your health care professional or pharmacist for more information. Do not breast-feed an infant while taking this medicine. This medicine may interfere with the ability to have a child. This medicine has caused ovarian failure in some women. This medicine has caused reduced sperm counts in some men. You should talk with your doctor or health care professional if you are concerned about your fertility. If you are going to have surgery, tell your doctor or health care professional that you have taken this medicine. What side effects may I notice from receiving this medicine? Side effects that you should report to your doctor or health care professional as soon as possible: -allergic reactions like skin rash, itching or hives, swelling of the  face, lips, or tongue -low blood counts - this medicine may decrease the number of white blood cells, red blood cells and platelets. You may be at increased risk for infections and bleeding. -signs of infection - fever or chills, cough, sore throat, pain or difficulty passing urine -signs of decreased platelets or bleeding - bruising, pinpoint red spots on the skin, black, tarry stools, blood in the urine -signs of decreased red blood cells - unusually weak or tired, fainting spells, lightheadedness -breathing problems -dark urine -dizziness -palpitations -swelling of the ankles, feet, hands -trouble passing urine or change in the amount of urine -weight gain -yellowing of the eyes or skin Side effects that usually do not require medical attention (report to your doctor or health care professional if they continue or are bothersome): -changes in nail or skin color -hair loss -missed menstrual periods -mouth sores -nausea, vomiting This list may not describe all possible side effects. Call your doctor for medical advice about side effects. You may report side effects to FDA at 1-800-FDA-1088. Where should I keep my medicine? This drug is given in a hospital or clinic and will not be stored at home. NOTE: This sheet is a summary. It may not cover all possible information. If you have questions about this medicine, talk to your doctor, pharmacist, or health care provider.  2015, [employer]. (2012-03-16 16:22:58)

## 2015-01-22 ENCOUNTER — Other Ambulatory Visit: Payer: 59

## 2015-01-22 ENCOUNTER — Ambulatory Visit: Payer: 59

## 2015-01-23 ENCOUNTER — Ambulatory Visit (HOSPITAL_BASED_OUTPATIENT_CLINIC_OR_DEPARTMENT_OTHER): Payer: 59 | Admitting: Nurse Practitioner

## 2015-01-23 ENCOUNTER — Encounter: Payer: Self-pay | Admitting: Nurse Practitioner

## 2015-01-23 ENCOUNTER — Other Ambulatory Visit (HOSPITAL_BASED_OUTPATIENT_CLINIC_OR_DEPARTMENT_OTHER): Payer: 59

## 2015-01-23 ENCOUNTER — Telehealth: Payer: Self-pay | Admitting: Nurse Practitioner

## 2015-01-23 VITALS — BP 113/66 | HR 73 | Temp 98.4°F | Resp 17 | Ht 65.0 in | Wt 215.7 lb

## 2015-01-23 DIAGNOSIS — C50311 Malignant neoplasm of lower-inner quadrant of right female breast: Secondary | ICD-10-CM | POA: Diagnosis not present

## 2015-01-23 DIAGNOSIS — Z171 Estrogen receptor negative status [ER-]: Secondary | ICD-10-CM

## 2015-01-23 LAB — COMPREHENSIVE METABOLIC PANEL (CC13)
ALBUMIN: 3.5 g/dL (ref 3.5–5.0)
ALT: 8 U/L (ref 0–55)
AST: 8 U/L (ref 5–34)
Alkaline Phosphatase: 99 U/L (ref 40–150)
Anion Gap: 5 mEq/L (ref 3–11)
BUN: 10.7 mg/dL (ref 7.0–26.0)
CHLORIDE: 103 meq/L (ref 98–109)
CO2: 28 mEq/L (ref 22–29)
Calcium: 9.1 mg/dL (ref 8.4–10.4)
Creatinine: 0.7 mg/dL (ref 0.6–1.1)
EGFR: >90 mL/min/{1.73_m2} (ref >90)
GLUCOSE: 128 mg/dL (ref 70–140)
POTASSIUM: 4.2 meq/L (ref 3.5–5.1)
SODIUM: 137 meq/L (ref 136–145)
Total Bilirubin: 0.39 mg/dL (ref 0.20–1.20)
Total Protein: 6.2 g/dL — ABNORMAL LOW (ref 6.4–8.3)

## 2015-01-23 LAB — CBC WITH DIFFERENTIAL/PLATELET
BASO%: 0.1 % (ref 0.0–2.0)
BASOS ABS: 0 10*3/uL (ref 0.0–0.1)
EOS%: 0.4 % (ref 0.0–7.0)
Eosinophils Absolute: 0 10*3/uL (ref 0.0–0.5)
HCT: 32.2 % — ABNORMAL LOW (ref 34.8–46.6)
HEMOGLOBIN: 10.1 g/dL — AB (ref 11.6–15.9)
LYMPH%: 25.3 % (ref 14.0–49.7)
MCH: 24.1 pg — AB (ref 25.1–34.0)
MCHC: 31.3 g/dL — AB (ref 31.5–36.0)
MCV: 77 fL — ABNORMAL LOW (ref 79.5–101.0)
MONO#: 0.2 10*3/uL (ref 0.1–0.9)
MONO%: 5.8 % (ref 0.0–14.0)
NEUT#: 2.2 10*3/uL (ref 1.5–6.5)
NEUT%: 68.4 % (ref 38.4–76.8)
Platelets: 164 10*3/uL (ref 145–400)
RBC: 4.18 10*6/uL (ref 3.70–5.45)
RDW: 14.8 % — AB (ref 11.2–14.5)
WBC: 3.2 10*3/uL — ABNORMAL LOW (ref 3.9–10.3)
lymph#: 0.8 10*3/uL — ABNORMAL LOW (ref 0.9–3.3)

## 2015-01-23 NOTE — Telephone Encounter (Signed)
Appointments made and avs printed for patient °

## 2015-01-23 NOTE — Progress Notes (Signed)
[facility]  [phone] [phone]     ID: Susan Wilson DOB: [birth year]  MR#: [id number]  BSJ#:[id number]  Patient Care Team: Donnamae Jude, MD as PCP - General (Obstetrics and Gynecology) Rolm Bookbinder, MD as Consulting Physician (General Surgery) Chauncey Cruel, MD as Consulting Physician (Oncology) Thea Silversmith, MD as Consulting Physician (Radiation Oncology) [name], RN as Registered Nurse Rockwell Germany, RN as Registered Nurse Holley Bouche, NP as Nurse Practitioner (Nurse Practitioner) PCP: Donnamae Jude, MD OTHER MD:  CHIEF COMPLAINT: Estrogen receptor negative breast cancer  CURRENT TREATMENT: Neoadjuvant chemotherapy   BREAST CANCER HISTORY: Susan Wilson herself noted a mass in her right breast sometime around April. Initially she thought it might be related to menstruation, but as it did not change and eventually became tender, she brought it to her physician's attention. On 11/20/2014 patient underwent bilateral diagnostic mammography with tomosynthesis and right breast ultrasonography at the breast Center. The breast density was category B. There was a hyperdense mass in the right lower inner quadrant associated with skin thickening. There was also a 5 mm nodule posteriorly at the 8:30 o'clock position in the right breast. There were several hyperdense nodules in the right axilla. On physical exam there was a firm fixed mass in the right breast at the 5:00 position. By ultrasound this was lobulated and appear to involve the skin. It measures up to 4.1 cm. There was no sonographic correlation to the 5 mm nodule seen in a different area of the right breast. The right axilla showed 3 hypervascular lymph nodes with prominent cortical thickening, measuring less than 1.5 cm.  On 11/21/2014 the patient underwent right breast biopsy (5:00 mass) and biopsy of one of the suspicious right axillary lymph nodes. 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  The patient's subsequent history is as detailed below  INTERVAL HISTORY: Susan Wilson returns today for follow up of her breast cancer, accompanied by her husband. Today is day 8, cycle 2 of cyclophosphamide and doxorubicin, with neulasta given on day 2 for granulocyte support.   REVIEW OF SYSTEMS: Susan Wilson believes this cycle went better than the first. She denies fevers and chills. Her nausea was better managed this round. She is moving her bowels well. She continues to eat and drink well. Her bone pain was worse this week, because she ran out of claritin. This week the tinging to her toes completely stopped ans she has no ankle swelling. She has cut back on the hours she's worked and numbness in her right hand has diminished. A detailed review of systems is otherwise stable.  PAST MEDICAL HISTORY: Past Medical History  Diagnosis Date  . 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 Breast cancer   . Hernia, umbilical   . Back pain   . Headache   . Anemia   . Hernia, umbilical   . Family history of breast cancer     PAST SURGICAL HISTORY: Past Surgical History  Procedure Laterality Date  . Portacath placement Right 12/16/2014    Procedure: INSERTION PORT-A-CATH WITH ULTRASOUND;  Surgeon: Rolm Bookbinder, MD;  Location: [facility];  Service: General;  Laterality: Right;    FAMILY HISTORY Family History  Problem Relation Age of Onset  .  Mesothelioma Maternal Grandfather     asbestos exposure, died in his 45s  . Heart defect Sister 0  . Heart disease Paternal Aunt   . Heart disease Paternal Uncle   . Breast cancer Paternal Grandmother   . Diabetes Paternal Grandmother   . Cancer Paternal Grandfather       NOS  . Colon cancer Other 69    MGMs brother with colon cancer  . Cancer Other     several of MGF's sisters with cancer NOS  . Liver cancer Other     MGF's brother   the patient's parents are living, her father being 1 and her mother 100 as of July 2016. The patient had no brothers. One sister died at age 57 from cardiac problems. The other sister is in good health. On the maternal side there is a history of colon cancer and an uncle age 39, liver cancer in a great uncle and mesothelioma in the maternal grandfather.  GYNECOLOGIC HISTORY:  No LMP recorded. Menarche age 18. The patient is GX P2. 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 The daughters are Susan Wilson and Susan Wilson, age 67 and 25. The patient attends a local [place]: Not in place   HEALTH MAINTENANCE: Social History  Substance Use Topics  . Smoking status: Never Smoker   . Smokeless tobacco: Never Used  . Alcohol Use: Yes     Comment: rarely     Colonoscopy:  PAP: 2014  Bone density:  Lipid panel:  No Known Allergies  Current Outpatient Prescriptions  Medication Sig Dispense Refill  . dexamethasone (DECADRON) 4 MG tablet Take 2 tablets by mouth once a day on the day after chemotherapy and then take 2 tablets two times a day for 2 days. Take with food. 30 tablet 1  . HYDROcodone-acetaminophen (NORCO/VICODIN) 5-325 MG per tablet take 1 tablet by mouth every 6 hours if needed for pain  0  . lidocaine-prilocaine (EMLA) cream Apply to affected area once 30 g 3  . LORazepam (ATIVAN) 0.5 MG tablet Take 1 tablet (0.5 mg total) by mouth at bedtime. 30 tablet 0  . ondansetron (ZOFRAN) 8 MG tablet Take 1 tablet (8 mg total) by mouth 2 (two) times daily as needed. Start on the third day after chemotherapy. 30 tablet 1  . cyclobenzaprine (FLEXERIL) 10 MG tablet Take 10 mg by  mouth 3 (three) times daily as needed for muscle spasms.    Marland Kitchen ibuprofen (ADVIL,MOTRIN) 200 MG tablet Take 400 mg by mouth every 6 (six) hours as needed (for pain).     . naproxen (NAPROSYN) 500 MG tablet Take 1 tablet (500 mg total) by mouth 2 (two) times daily. (Patient not taking: Reported on 01/16/2015) 30 tablet 0  . oxyCODONE-acetaminophen (PERCOCET) 10-325 MG per tablet Take 1 tablet by mouth every 6 (six) hours as needed for pain. (Patient not taking: Reported on 01/23/2015) 10 tablet 0  . prochlorperazine (COMPAZINE) 10 MG tablet Take 1 tablet (10 mg total) by mouth every 6 (six) hours as needed (Nausea or vomiting). (Patient not taking: Reported on 01/23/2015) 30 tablet 1  . traMADol (ULTRAM) 50 MG tablet take 1 tablet by mouth every 6 hours if needed  0   No current facility-administered medications for this visit.    OBJECTIVE: Young African-American woman who appears well Filed Vitals:   01/23/15 1000  BP: 113/66  Pulse: 73  Temp: 98.4 F (36.9 C)  Resp: 17     Body mass index is 35.89 kg/(m^2).    ECOG FS:0 - Asymptomatic  Sclerae unicteric, pupils round and equal Oropharynx clear and moist-- no thrush or other lesions No cervical or supraclavicular adenopathy Lungs no rales or rhonchi Heart regular rate and rhythm Abd soft, nontender, positive bowel sounds MSK no focal spinal tenderness, no upper extremity lymphedema Neuro: nonfocal, well oriented, appropriate affect Breasts: deferred  LAB RESULTS:  CMP     Component Value Date/Time   NA 140 01/16/2015 0922   NA 136 12/12/2014 1222   K 4.2 01/16/2015 0922   K 4.2 12/12/2014 1222   CL 102 12/12/2014 1222   CO2 28 01/16/2015 0922   CO2 25 12/12/2014 1222   GLUCOSE 95 01/16/2015 0922   GLUCOSE 98 12/12/2014 1222   BUN 11.8 01/16/2015 0922   BUN 14 12/12/2014 1222   CREATININE 0.8 01/16/2015 0922   CREATININE 0.69 12/12/2014 1222   CALCIUM 9.6 01/16/2015 0922   CALCIUM 9.3 12/12/2014 1222   PROT 7.1 01/16/2015  0922   ALBUMIN 3.9 01/16/2015 0922   AST 11 01/16/2015 0922   ALT 13 01/16/2015 0922   ALKPHOS 85 01/16/2015 0922   BILITOT 0.21 01/16/2015 0922   GFRNONAA >60 12/12/2014 1222   GFRAA >60 12/12/2014 1222    INo results found for: SPEP, UPEP  Lab Results  Component Value Date   WBC 3.2* 01/23/2015   NEUTROABS 2.2 01/23/2015   HGB 10.1* 01/23/2015   HCT 32.2* 01/23/2015   MCV 77.0* 01/23/2015   PLT 164 01/23/2015      Chemistry      Component Value Date/Time   NA 140 01/16/2015 0922   NA 136 12/12/2014 1222   K 4.2 01/16/2015 0922   K 4.2 12/12/2014 1222   CL 102 12/12/2014 1222   CO2 28 01/16/2015 0922   CO2 25 12/12/2014 1222   BUN 11.8 01/16/2015 0922   BUN 14 12/12/2014 1222   CREATININE 0.8 01/16/2015 0922   CREATININE 0.69 12/12/2014 1222      Component Value Date/Time   CALCIUM 9.6 01/16/2015 0922   CALCIUM 9.3 12/12/2014 1222   ALKPHOS 85 01/16/2015 0922   AST 11 01/16/2015 0922   ALT 13 01/16/2015 0922   BILITOT 0.21 01/16/2015 0922       No results found for: LABCA2  No components found for: LABCA125  No results for input(s): INR in the last 168 hours.  Urinalysis    Component Value Date/Time   LABSPEC 1.020 05/06/2009 1439   PHURINE 7.0 05/06/2009 1439   GLUCOSEU NEGATIVE 05/06/2009 1439   HGBUR NEGATIVE 05/06/2009 1439   BILIRUBINUR NEGATIVE 05/06/2009 1439   KETONESUR NEGATIVE 05/06/2009 1439   PROTEINUR NEGATIVE 05/06/2009 1439   UROBILINOGEN 0.2 05/06/2009 1439   NITRITE NEGATIVE 05/06/2009 1439   LEUKOCYTESUR  05/06/2009 1439    NEGATIVE Biochemical Testing Only. Please order routine urinalysis from main lab if confirmatory testing is needed.    STUDIES: No results found.  ASSESSMENT: 39 y.o. [place] woman s/p Right breast biopsy 11/21/2014 for a clinical T2 NX, stage 2 invasive ductal carcinoma, grade 2 or 3, estrogen and progesterone receptor negative, HER-2 equivocal, with an Mib-1 of 20%  (a) biopsy of a suspicious  axillary lymph node same day was negative, but discordant  (1) neoadjuvant chemotherapy will consist of cyclophosphamide and doxorubicin in dose dense fashion 4, with onpro support, to be followed by  paclitaxel weekly 12  (a) if the patient's tumor proves to be HER-2 positive on review, trastuzumab and pertuzumab will be added with the Taxol treatments  (2) genetics testing pending  (3) definitive surgery depending on genetics result, to follow chemotherapy  (4) radiation to follow surgery  PLAN: Susan Wilson continues to manage treatment well. The labs were reviewed in detail and were stable. 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 The reduction in hours of using a keyboard has significantly improved the tingling to her right hand.   Susan Wilson will return in 1 week for labs and cycle 3 of treatment. She understands and agrees with this plan. She knows the goal of treatment in her case is cure. She has been encouraged to call with any issues that might arise before her next visit here.   Laurie Panda, NP   01/23/2015 10:10 AM

## 2015-01-30 ENCOUNTER — Encounter: Payer: Self-pay | Admitting: *Deleted

## 2015-01-30 ENCOUNTER — Encounter: Payer: Self-pay | Admitting: Pharmacist

## 2015-01-30 ENCOUNTER — Ambulatory Visit (HOSPITAL_BASED_OUTPATIENT_CLINIC_OR_DEPARTMENT_OTHER): Payer: 59 | Admitting: Nurse Practitioner

## 2015-01-30 ENCOUNTER — Encounter: Payer: Self-pay | Admitting: Nurse Practitioner

## 2015-01-30 ENCOUNTER — Other Ambulatory Visit (HOSPITAL_BASED_OUTPATIENT_CLINIC_OR_DEPARTMENT_OTHER): Payer: 59

## 2015-01-30 ENCOUNTER — Ambulatory Visit (HOSPITAL_BASED_OUTPATIENT_CLINIC_OR_DEPARTMENT_OTHER): Payer: 59

## 2015-01-30 VITALS — BP 116/77 | HR 80 | Temp 98.3°F | Resp 18 | Ht 65.0 in | Wt 219.4 lb

## 2015-01-30 DIAGNOSIS — Z5111 Encounter for antineoplastic chemotherapy: Secondary | ICD-10-CM | POA: Diagnosis not present

## 2015-01-30 DIAGNOSIS — C50311 Malignant neoplasm of lower-inner quadrant of right female breast: Secondary | ICD-10-CM

## 2015-01-30 LAB — CBC WITH DIFFERENTIAL/PLATELET
BASO%: 0.9 % (ref 0.0–2.0)
BASOS ABS: 0.1 10*3/uL (ref 0.0–0.1)
EOS ABS: 0 10*3/uL (ref 0.0–0.5)
EOS%: 0.3 % (ref 0.0–7.0)
HEMATOCRIT: 31.7 % — AB (ref 34.8–46.6)
HEMOGLOBIN: 10 g/dL — AB (ref 11.6–15.9)
LYMPH%: 14 % (ref 14.0–49.7)
MCH: 24.1 pg — AB (ref 25.1–34.0)
MCHC: 31.7 g/dL (ref 31.5–36.0)
MCV: 76.2 fL — AB (ref 79.5–101.0)
MONO#: 0.4 10*3/uL (ref 0.1–0.9)
MONO%: 4.2 % (ref 0.0–14.0)
NEUT#: 7.4 10*3/uL — ABNORMAL HIGH (ref 1.5–6.5)
NEUT%: 80.6 % — ABNORMAL HIGH (ref 38.4–76.8)
PLATELETS: 197 10*3/uL (ref 145–400)
RBC: 4.15 10*6/uL (ref 3.70–5.45)
RDW: 14.9 % — AB (ref 11.2–14.5)
WBC: 9.2 10*3/uL (ref 3.9–10.3)
lymph#: 1.3 10*3/uL (ref 0.9–3.3)

## 2015-01-30 LAB — COMPREHENSIVE METABOLIC PANEL (CC13)
ALBUMIN: 3.7 g/dL (ref 3.5–5.0)
ALK PHOS: 89 U/L (ref 40–150)
ALT: 13 U/L (ref 0–55)
ANION GAP: 7 meq/L (ref 3–11)
AST: 11 U/L (ref 5–34)
BUN: 9.3 mg/dL (ref 7.0–26.0)
CALCIUM: 9.2 mg/dL (ref 8.4–10.4)
CO2: 26 mEq/L (ref 22–29)
Chloride: 105 mEq/L (ref 98–109)
Creatinine: 0.7 mg/dL (ref 0.6–1.1)
Glucose: 107 mg/dl (ref 70–140)
POTASSIUM: 3.7 meq/L (ref 3.5–5.1)
SODIUM: 139 meq/L (ref 136–145)
Total Bilirubin: 0.25 mg/dL (ref 0.20–1.20)
Total Protein: 6.7 g/dL (ref 6.4–8.3)

## 2015-01-30 MED ORDER — SODIUM CHLORIDE 0.9 % IJ SOLN
10.0000 mL | INTRAMUSCULAR | Status: DC | PRN
  Administered 2015-01-30: 10 mL
  Filled 2015-01-30: qty 10

## 2015-01-30 MED ORDER — HEPARIN SOD (PORK) LOCK FLUSH 100 UNIT/ML IV SOLN
500.0000 [IU] | Freq: Once | INTRAVENOUS | Status: AC | PRN
Start: 2015-01-30 — End: 2015-01-30
  Administered 2015-01-30: 500 [IU]
  Filled 2015-01-30: qty 5

## 2015-01-30 MED ORDER — PEGFILGRASTIM 6 MG/0.6ML ~~LOC~~ PSKT
6.0000 mg | PREFILLED_SYRINGE | Freq: Once | SUBCUTANEOUS | Status: AC
  Administered 2015-01-30: 6 mg via SUBCUTANEOUS
  Filled 2015-01-30: qty 0.6

## 2015-01-30 MED ORDER — SODIUM CHLORIDE 0.9 % IV SOLN
Freq: Once | INTRAVENOUS | Status: AC
  Administered 2015-01-30: 11:00:00 via INTRAVENOUS

## 2015-01-30 MED ORDER — PALONOSETRON HCL INJECTION 0.25 MG/5ML
0.2500 mg | Freq: Once | INTRAVENOUS | Status: AC
  Administered 2015-01-30: 0.25 mg via INTRAVENOUS

## 2015-01-30 MED ORDER — DOXORUBICIN HCL CHEMO IV INJECTION 2 MG/ML
60.0000 mg/m2 | Freq: Once | INTRAVENOUS | Status: AC
  Administered 2015-01-30: 128 mg via INTRAVENOUS
  Filled 2015-01-30: qty 64

## 2015-01-30 MED ORDER — PALONOSETRON HCL INJECTION 0.25 MG/5ML
INTRAVENOUS | Status: AC
  Filled 2015-01-30: qty 5

## 2015-01-30 MED ORDER — SODIUM CHLORIDE 0.9 % IV SOLN
Freq: Once | INTRAVENOUS | Status: AC
  Administered 2015-01-30: 11:00:00 via INTRAVENOUS
  Filled 2015-01-30: qty 5

## 2015-01-30 MED ORDER — SODIUM CHLORIDE 0.9 % IV SOLN
600.0000 mg/m2 | Freq: Once | INTRAVENOUS | Status: AC
  Administered 2015-01-30: 1280 mg via INTRAVENOUS
  Filled 2015-01-30: qty 64

## 2015-01-30 NOTE — Progress Notes (Signed)
I s/w pt and her husband in the infusion area today prior to being accessed for cycle 3 of A/C & offered her urine pregnancy test.  She and her husband are sure she's not pregnant & wish to proceed today w/o testing.  Pt states she has not had a period after she started chemo. They're using back up contraception. Kennith Center, Pharm.D., CPP 01/30/2015@10 :52 AM

## 2015-01-30 NOTE — Patient Instructions (Signed)
[facility] Discharge Instructions for Patients Receiving Chemotherapy  Today you received the following chemotherapy agents Adriamycin /Cytoxan  To help prevent nausea and vomiting after your treatment, we encourage you to take your nausea medication     If you develop nausea and vomiting that is not controlled by your nausea medication, call the clinic.   BELOW ARE SYMPTOMS THAT SHOULD BE REPORTED IMMEDIATELY:  *FEVER GREATER THAN 100.5 F  *CHILLS WITH OR WITHOUT FEVER  NAUSEA AND VOMITING THAT IS NOT CONTROLLED WITH YOUR NAUSEA MEDICATION  *UNUSUAL SHORTNESS OF BREATH  *UNUSUAL BRUISING OR BLEEDING  TENDERNESS IN MOUTH AND THROAT WITH OR WITHOUT PRESENCE OF ULCERS  *URINARY PROBLEMS  *BOWEL PROBLEMS  UNUSUAL RASH Items with * indicate a potential emergency and should be followed up as soon as possible.  Feel free to call the clinic you have any questions or concerns. The clinic phone number is [phone].  Please show the [facility] at check-in to the Emergency Department and triage nurse.

## 2015-01-30 NOTE — Progress Notes (Signed)
[facility]  [phone] [phone]) [phone]     ID: Susan Wilson DOB: [birth year]  MR#: [id number]  JEH#:[id number]  Patient Care Team: Donnamae Jude, MD as PCP - General (Obstetrics and Gynecology) Rolm Bookbinder, MD as Consulting Physician (General Surgery) Chauncey Cruel, MD as Consulting Physician (Oncology) Thea Silversmith, MD as Consulting Physician (Radiation Oncology) [name], RN as Registered Nurse Rockwell Germany, RN as Registered Nurse Holley Bouche, NP as Nurse Practitioner (Nurse Practitioner) PCP: Donnamae Jude, MD OTHER MD:  CHIEF COMPLAINT: Estrogen receptor negative breast cancer  CURRENT TREATMENT: Neoadjuvant chemotherapy   BREAST CANCER HISTORY: Susan Wilson herself noted a mass in her right breast sometime around April. Initially she thought it might be related to menstruation, but as it did not change and eventually became tender, she brought it to her physician's attention. On 11/20/2014 patient underwent bilateral diagnostic mammography with tomosynthesis and right breast ultrasonography at the breast Center. The breast density was category B. There was a hyperdense mass in the right lower inner quadrant associated with skin thickening. There was also a 5 mm nodule posteriorly at the 8:30 o'clock position in the right breast. There were several hyperdense nodules in the right axilla. On physical exam there was a firm fixed mass in the right breast at the 5:00 position. By ultrasound this was lobulated and appear to involve the skin. It measures up to 4.1 cm. There was no sonographic correlation to the 5 mm nodule seen in a different area of the right breast. The right axilla showed 3 hypervascular lymph nodes with prominent cortical thickening, measuring less than 1.5 cm.  On 11/21/2014 the patient underwent right breast biopsy (5:00 mass) and biopsy of one of the suspicious right axillary lymph nodes. 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  The patient's subsequent history is as detailed below  INTERVAL HISTORY: Susan Wilson returns today for follow up of her breast cancer, accompanied by her husband. Today is day 1, cycle 3 of cyclophosphamide and doxorubicin, with neulasta given on day 2 for granulocyte support.   REVIEW OF SYSTEMS: Susan Wilson denies fevers, chills, or changes in bowel or bladder habits. She has some queeziness, but no vomiting. Her appetite is fair and her energy level is decent. The tingling to her left hand is even more improved since last week. She has no numbness to her toes. She denies mouth sores or rashes. A detailed review of systems is otherwise stable.   PAST MEDICAL HISTORY: Past Medical History  Diagnosis Date  . 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 Breast cancer   . Hernia, umbilical   . Back pain   . Headache   . Anemia   . Hernia, umbilical   . Family history of breast cancer     PAST SURGICAL HISTORY: Past Surgical History  Procedure Laterality Date  . Portacath placement Right 12/16/2014    Procedure: INSERTION PORT-A-CATH WITH ULTRASOUND;  Surgeon: Rolm Bookbinder, MD;  Location: [facility];  Service: General;  Laterality: Right;    FAMILY HISTORY Family History  Problem Relation Age of Onset  . Mesothelioma Maternal Grandfather     asbestos exposure, died in his 25s  . Heart defect Sister 0  . Heart disease  Paternal Aunt   . Heart disease Paternal Uncle   . Breast cancer Paternal Grandmother   . Diabetes Paternal Grandmother   . Cancer Paternal Grandfather     NOS  . Colon cancer Other 65    MGMs brother with colon cancer  . Cancer Other     several of MGF's sisters with cancer NOS  .  Liver cancer Other     MGF's brother   the patient's parents are living, her father being 69 and her mother 29 as of July 2016. The patient had no brothers. One sister died at age 27 from cardiac problems. The other sister is in good health. On the maternal side there is a history of colon cancer and an uncle age 9, liver cancer in a great uncle and mesothelioma in the maternal grandfather.  GYNECOLOGIC HISTORY:  No LMP recorded. Menarche age 52. The patient is GX P2. 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 The daughters are [place] and Lovie Macadamia, age 57 and 34. The patient attends a local [place]: Not in place   HEALTH MAINTENANCE: Social History  Substance Use Topics  . Smoking status: Never Smoker   . Smokeless tobacco: Never Used  . Alcohol Use: Yes     Comment: rarely     Colonoscopy:  PAP: 2014  Bone density:  Lipid panel:  No Known Allergies  Current Outpatient Prescriptions  Medication Sig Dispense Refill  . dexamethasone (DECADRON) 4 MG tablet Take 2 tablets by mouth once a day on the day after chemotherapy and then take 2 tablets two times a day for 2 days. Take with food. 30 tablet 1  . HYDROcodone-acetaminophen (NORCO/VICODIN) 5-325 MG per tablet take 1 tablet by mouth every 6 hours if needed for pain  0  . ibuprofen (ADVIL,MOTRIN) 200 MG tablet Take 400 mg by mouth every 6 (six) hours as needed (for pain).     Marland Kitchen lidocaine-prilocaine (EMLA) cream Apply to affected area once 30 g 3  . ondansetron (ZOFRAN) 8 MG tablet Take 1 tablet (8 mg total) by mouth 2 (two) times daily as needed. Start on the third day after chemotherapy. 30 tablet 1  . prochlorperazine (COMPAZINE) 10 MG tablet Take 1 tablet (10 mg total) by mouth every 6 (six) hours as needed (Nausea or vomiting). 30 tablet 1  . LORazepam (ATIVAN) 0.5 MG tablet  Take 1 tablet (0.5 mg total) by mouth at bedtime. (Patient not taking: Reported on 01/30/2015) 30 tablet 0  . naproxen (NAPROSYN) 500 MG tablet Take 1 tablet (500 mg total) by mouth 2 (two) times daily. (Patient not taking: Reported on 01/16/2015) 30 tablet 0  . oxyCODONE-acetaminophen (PERCOCET) 10-325 MG per tablet Take 1 tablet by mouth every 6 (six) hours as needed for pain. (Patient not taking: Reported on 01/23/2015) 10 tablet 0   No current facility-administered medications for this visit.    OBJECTIVE: Young African-American woman who appears well Filed Vitals:   01/30/15 0953  BP: 116/77  Pulse: 80  Temp: 98.3 F (36.8 C)  Resp: 18     Body mass index is 36.51 kg/(m^2).    ECOG FS:0 - Asymptomatic  Skin: warm, dry  HEENT: sclerae anicteric, conjunctivae pink, oropharynx clear. No thrush or mucositis.  Lymph Nodes: No cervical or supraclavicular lymphadenopathy  Lungs: clear to auscultation bilaterally, no rales, wheezes, or rhonci  Heart:  regular rate and rhythm  Abdomen: round, soft, non tender, positive bowel sounds  Musculoskeletal: No focal spinal tenderness, no peripheral edema  Neuro: non focal, well oriented, positive affect  Breasts: deferred  LAB RESULTS:  CMP     Component Value Date/Time   NA 139 01/30/2015 0939   NA 136 12/12/2014 1222   K 3.7 01/30/2015 0939   K 4.2 12/12/2014 1222   CL 102 12/12/2014 1222   CO2 26 01/30/2015 0939   CO2 25 12/12/2014 1222   GLUCOSE 107 01/30/2015 0939   GLUCOSE 98 12/12/2014 1222   BUN 9.3 01/30/2015 0939   BUN 14 12/12/2014 1222   CREATININE 0.7 01/30/2015 0939   CREATININE 0.69 12/12/2014 1222   CALCIUM 9.2 01/30/2015 0939   CALCIUM 9.3 12/12/2014 1222   PROT 6.7 01/30/2015 0939   ALBUMIN 3.7 01/30/2015 0939   AST 11 01/30/2015 0939   ALT 13 01/30/2015 0939   ALKPHOS 89 01/30/2015 0939   BILITOT 0.25 01/30/2015 0939   GFRNONAA >60 12/12/2014 1222   GFRAA >60 12/12/2014 1222    INo results found for: SPEP,  UPEP  Lab Results  Component Value Date   WBC 9.2 01/30/2015   NEUTROABS 7.4* 01/30/2015   HGB 10.0* 01/30/2015   HCT 31.7* 01/30/2015   MCV 76.2* 01/30/2015   PLT 197 01/30/2015      Chemistry      Component Value Date/Time   NA 139 01/30/2015 0939   NA 136 12/12/2014 1222   K 3.7 01/30/2015 0939   K 4.2 12/12/2014 1222   CL 102 12/12/2014 1222   CO2 26 01/30/2015 0939   CO2 25 12/12/2014 1222   BUN 9.3 01/30/2015 0939   BUN 14 12/12/2014 1222   CREATININE 0.7 01/30/2015 0939   CREATININE 0.69 12/12/2014 1222      Component Value Date/Time   CALCIUM 9.2 01/30/2015 0939   CALCIUM 9.3 12/12/2014 1222   ALKPHOS 89 01/30/2015 0939   AST 11 01/30/2015 0939   ALT 13 01/30/2015 0939   BILITOT 0.25 01/30/2015 0939       No results found for: LABCA2  No components found for: [id number]  No results for input(s): INR in the last 168 hours.  Urinalysis    Component Value Date/Time   LABSPEC 1.020 05/06/2009 1439   PHURINE 7.0 05/06/2009 1439   GLUCOSEU NEGATIVE 05/06/2009 1439   HGBUR NEGATIVE 05/06/2009 1439   BILIRUBINUR NEGATIVE 05/06/2009 1439   KETONESUR NEGATIVE 05/06/2009 1439   PROTEINUR NEGATIVE 05/06/2009 1439   UROBILINOGEN 0.2 05/06/2009 1439   NITRITE NEGATIVE 05/06/2009 1439   LEUKOCYTESUR  05/06/2009 1439    NEGATIVE Biochemical Testing Only. Please order routine urinalysis from main lab if confirmatory testing is needed.    STUDIES: No results found.  ASSESSMENT: 39 y.o. [place] woman s/p Right breast biopsy 11/21/2014 for a clinical T2 NX, stage 2 invasive ductal carcinoma, grade 2 or 3, estrogen and progesterone receptor negative, HER-2 equivocal, with an Mib-1 of 20%  (a) biopsy of a suspicious axillary lymph node same day was negative, but discordant  (1) neoadjuvant chemotherapy will consist of cyclophosphamide and doxorubicin in dose dense fashion 4, with onpro support, to be followed by paclitaxel weekly 12  (a) if the patient's tumor  proves to be HER-2 positive on review, trastuzumab and pertuzumab will be added with the Taxol treatments  (2) genetics testing pending  (3) definitive surgery depending on genetics result, to follow chemotherapy  (4) radiation to follow surgery  PLAN: Susan Wilson looks and feels well. The labs were reviewed in detail and were stable. She will proceed with cycle 3 of doxorubicin and cyclophosphamide as planned today.   Susan Wilson will return in 1 week for labs and a nadir visit. She understands and agrees with this plan. She knows the goal of treatment in her case is cure. She has been encouraged to call with any issues that might arise before her next visit here.   Laurie Panda, NP   01/30/2015 10:35 AM

## 2015-02-02 ENCOUNTER — Other Ambulatory Visit: Payer: Self-pay | Admitting: Oncology

## 2015-02-05 ENCOUNTER — Other Ambulatory Visit: Payer: 59

## 2015-02-05 ENCOUNTER — Ambulatory Visit: Payer: 59

## 2015-02-06 ENCOUNTER — Other Ambulatory Visit (HOSPITAL_BASED_OUTPATIENT_CLINIC_OR_DEPARTMENT_OTHER): Payer: 59

## 2015-02-06 ENCOUNTER — Encounter: Payer: Self-pay | Admitting: Nurse Practitioner

## 2015-02-06 ENCOUNTER — Ambulatory Visit (HOSPITAL_BASED_OUTPATIENT_CLINIC_OR_DEPARTMENT_OTHER): Payer: 59 | Admitting: Nurse Practitioner

## 2015-02-06 VITALS — BP 106/69 | HR 80 | Temp 98.4°F | Resp 18 | Ht 65.0 in | Wt 215.5 lb

## 2015-02-06 DIAGNOSIS — D649 Anemia, unspecified: Secondary | ICD-10-CM

## 2015-02-06 DIAGNOSIS — C50311 Malignant neoplasm of lower-inner quadrant of right female breast: Secondary | ICD-10-CM | POA: Diagnosis not present

## 2015-02-06 LAB — COMPREHENSIVE METABOLIC PANEL (CC13)
ALT: 13 U/L (ref 0–55)
ANION GAP: 5 meq/L (ref 3–11)
AST: 8 U/L (ref 5–34)
Albumin: 3.6 g/dL (ref 3.5–5.0)
Alkaline Phosphatase: 94 U/L (ref 40–150)
BUN: 7.1 mg/dL (ref 7.0–26.0)
CHLORIDE: 104 meq/L (ref 98–109)
CO2: 30 meq/L — AB (ref 22–29)
CREATININE: 0.7 mg/dL (ref 0.6–1.1)
Calcium: 9 mg/dL (ref 8.4–10.4)
EGFR: >90 mL/min/{1.73_m2} (ref >90)
GLUCOSE: 96 mg/dL (ref 70–140)
Potassium: 4.3 mEq/L (ref 3.5–5.1)
SODIUM: 139 meq/L (ref 136–145)
Total Bilirubin: 0.64 mg/dL (ref 0.20–1.20)
Total Protein: 6.3 g/dL — ABNORMAL LOW (ref 6.4–8.3)

## 2015-02-06 LAB — CBC WITH DIFFERENTIAL/PLATELET
BASO%: 0.4 % (ref 0.0–2.0)
Basophils Absolute: 0 10*3/uL (ref 0.0–0.1)
EOS%: 0.4 % (ref 0.0–7.0)
Eosinophils Absolute: 0 10*3/uL (ref 0.0–0.5)
HCT: 30.9 % — ABNORMAL LOW (ref 34.8–46.6)
HGB: 9.8 g/dL — ABNORMAL LOW (ref 11.6–15.9)
LYMPH%: 26.3 % (ref 14.0–49.7)
MCH: 24.5 pg — ABNORMAL LOW (ref 25.1–34.0)
MCHC: 31.7 g/dL (ref 31.5–36.0)
MCV: 77.3 fL — ABNORMAL LOW (ref 79.5–101.0)
MONO#: 0.2 10*3/uL (ref 0.1–0.9)
MONO%: 7.9 % (ref 0.0–14.0)
NEUT%: 65 % (ref 38.4–76.8)
NEUTROS ABS: 1.5 10*3/uL (ref 1.5–6.5)
NRBC: 0 % (ref 0–0)
PLATELETS: 97 10*3/uL — AB (ref 145–400)
RBC: 4 10*6/uL (ref 3.70–5.45)
RDW: 15.1 % — AB (ref 11.2–14.5)
WBC: 2.3 10*3/uL — AB (ref 3.9–10.3)
lymph#: 0.6 10*3/uL — ABNORMAL LOW (ref 0.9–3.3)

## 2015-02-06 NOTE — Progress Notes (Signed)
[facility]  [phone] [phone]     ID: Susan Wilson DOB: Jul 07, 1975  MR#: [id number]  BWG#:[id number]  Patient Care Team: Donnamae Jude, MD as PCP - General (Obstetrics and Gynecology) Rolm Bookbinder, MD as Consulting Physician (General Surgery) Chauncey Cruel, MD as Consulting Physician (Oncology) Thea Silversmith, MD as Consulting Physician (Radiation Oncology) [name], RN as Registered Nurse Rockwell Germany, RN as Registered Nurse Holley Bouche, NP as Nurse Practitioner (Nurse Practitioner) PCP: Donnamae Jude, MD OTHER MD:  CHIEF COMPLAINT: Estrogen receptor negative breast cancer  CURRENT TREATMENT: Neoadjuvant chemotherapy   BREAST CANCER HISTORY: Susan Wilson herself noted a mass in her right breast sometime around April. Initially she thought it might be related to menstruation, but as it did not change and eventually became tender, she brought it to her physician's attention. On 11/20/2014 patient underwent bilateral diagnostic mammography with tomosynthesis and right breast ultrasonography at the breast Center. The breast density was category B. There was a hyperdense mass in the right lower inner quadrant associated with skin thickening. There was also a 5 mm nodule posteriorly at the 8:30 o'clock position in the right breast. There were several hyperdense nodules in the right axilla. On physical exam there was a firm fixed mass in the right breast at the 5:00 position. By ultrasound this was lobulated and appear to involve the skin. It measures up to 4.1 cm. There was no sonographic correlation to the 5 mm nodule seen in a different area of the right breast. The right axilla showed 3 hypervascular lymph nodes with prominent cortical thickening, measuring less than 1.5 cm.  On 11/21/2014 the patient underwent right breast biopsy (5:00 mass) and biopsy of one of the suspicious right axillary lymph nodes. 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  The patient's subsequent history is as detailed below  INTERVAL HISTORY: Susan Wilson returns today for follow up of her breast cancer, accompanied by her husband. Today is day 8, cycle 3 of cyclophosphamide and doxorubicin, with neulasta given on day 2 for granulocyte support.   REVIEW OF SYSTEMS: Susan Wilson is more fatigued this week than usual. She feels sluggish at work. She denies fevers or chills. She has had nausea with relation to smells more so than taste changes. She is eating and drinking decently. She is moving her bowels well. She denies mouth sores, rashes, or neuropathy symptoms. The tingling to her right hand is likely due to neuropathy. A detailed review of systems is otherwise stable.   PAST MEDICAL HISTORY: Past Medical History  Diagnosis Date  . 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 Breast cancer   . Hernia, umbilical   . Back pain   . Headache   . Anemia   . Hernia, umbilical   . Family history of breast cancer     PAST SURGICAL HISTORY: Past Surgical History  Procedure Laterality Date  . Portacath placement Right 12/16/2014    Procedure: INSERTION PORT-A-CATH WITH ULTRASOUND;  Surgeon: Rolm Bookbinder, MD;  Location: [facility];  Service: General;  Laterality: Right;    FAMILY HISTORY Family History  Problem Relation Age of Onset  . Mesothelioma Maternal Grandfather     asbestos exposure, died in his 16s  .  Heart defect Sister 0  . Heart disease Paternal Aunt   . Heart disease Paternal Uncle   . Breast cancer Paternal Grandmother   . Diabetes Paternal Grandmother   . Cancer Paternal Grandfather     NOS  . Colon cancer Other 78    MGMs brother with colon cancer  .  Cancer Other     several of MGF's sisters with cancer NOS  . Liver cancer Other     MGF's brother   the patient's parents are living, her father being 80 and her mother 33 as of July 2016. The patient had no brothers. One sister died at age 13 from cardiac problems. The other sister is in good health. On the maternal side there is a history of colon cancer and an uncle age 1, liver cancer in a great uncle and mesothelioma in the maternal grandfather.  GYNECOLOGIC HISTORY:  No LMP recorded. Menarche age 59. The patient is GX P2. 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 The daughters are [place] and Lovie Macadamia, age 64 and 89. The patient attends a local [place]: Not in place   HEALTH MAINTENANCE: Social History  Substance Use Topics  . Smoking status: Never Smoker   . Smokeless tobacco: Never Used  . Alcohol Use: Yes     Comment: rarely     Colonoscopy:  PAP: 2014  Bone density:  Lipid panel:  No Known Allergies  Current Outpatient Prescriptions  Medication Sig Dispense Refill  . dexamethasone (DECADRON) 4 MG tablet Take 2 tablets by mouth once a day on the day after chemotherapy and then take 2 tablets two times a day for 2 days. Take with food. 30 tablet 1  . lidocaine-prilocaine (EMLA) cream Apply to affected area once 30 g 3  . LORazepam (ATIVAN) 0.5 MG tablet Take 1 tablet (0.5 mg total) by mouth at bedtime. 30 tablet 0  . ondansetron (ZOFRAN) 8 MG tablet Take 1 tablet (8 mg total) by mouth 2 (two) times daily as needed. Start on the third day after chemotherapy. 30 tablet 1  . prochlorperazine (COMPAZINE) 10 MG tablet Take 1 tablet (10 mg total) by mouth every 6 (six) hours as needed (Nausea or vomiting). 30 tablet 1  . HYDROcodone-acetaminophen (NORCO/VICODIN) 5-325 MG per tablet take 1 tablet by mouth every 6 hours if needed  for pain  0  . ibuprofen (ADVIL,MOTRIN) 200 MG tablet Take 400 mg by mouth every 6 (six) hours as needed (for pain).     . naproxen (NAPROSYN) 500 MG tablet Take 1 tablet (500 mg total) by mouth 2 (two) times daily. (Patient not taking: Reported on 01/16/2015) 30 tablet 0  . oxyCODONE-acetaminophen (PERCOCET) 10-325 MG per tablet Take 1 tablet by mouth every 6 (six) hours as needed for pain. (Patient not taking: Reported on 01/23/2015) 10 tablet 0   No current facility-administered medications for this visit.    OBJECTIVE: Young African-American woman who appears well Filed Vitals:   02/06/15 0851  BP: 106/69  Pulse: 80  Temp: 98.4 F (36.9 C)  Resp: 18     Body mass index is 35.86 kg/(m^2).    ECOG FS:0 - Asymptomatic  Sclerae unicteric, pupils round and equal Oropharynx clear and moist-- no thrush or other lesions No cervical or supraclavicular adenopathy Lungs no rales or rhonchi Heart regular rate and rhythm Abd soft, nontender, positive  bowel sounds MSK no focal spinal tenderness, no upper extremity lymphedema Neuro: nonfocal, well oriented, appropriate affect Breasts: deferred  LAB RESULTS:  CMP     Component Value Date/Time   NA 139 02/06/2015 0841   NA 136 12/12/2014 1222   K 4.3 02/06/2015 0841   K 4.2 12/12/2014 1222   CL 102 12/12/2014 1222   CO2 30* 02/06/2015 0841   CO2 25 12/12/2014 1222   GLUCOSE 96 02/06/2015 0841   GLUCOSE 98 12/12/2014 1222   BUN 7.1 02/06/2015 0841   BUN 14 12/12/2014 1222   CREATININE 0.7 02/06/2015 0841   CREATININE 0.69 12/12/2014 1222   CALCIUM 9.0 02/06/2015 0841   CALCIUM 9.3 12/12/2014 1222   PROT 6.3* 02/06/2015 0841   ALBUMIN 3.6 02/06/2015 0841   AST 8 02/06/2015 0841   ALT 13 02/06/2015 0841   ALKPHOS 94 02/06/2015 0841   BILITOT 0.64 02/06/2015 0841   GFRNONAA >60 12/12/2014 1222   GFRAA >60 12/12/2014 1222    INo results found for: SPEP, UPEP  Lab Results  Component Value Date   WBC 2.3* 02/06/2015   NEUTROABS  1.5 02/06/2015   HGB 9.8* 02/06/2015   HCT 30.9* 02/06/2015   MCV 77.3* 02/06/2015   PLT 97* 02/06/2015      Chemistry      Component Value Date/Time   NA 139 02/06/2015 0841   NA 136 12/12/2014 1222   K 4.3 02/06/2015 0841   K 4.2 12/12/2014 1222   CL 102 12/12/2014 1222   CO2 30* 02/06/2015 0841   CO2 25 12/12/2014 1222   BUN 7.1 02/06/2015 0841   BUN 14 12/12/2014 1222   CREATININE 0.7 02/06/2015 0841   CREATININE 0.69 12/12/2014 1222      Component Value Date/Time   CALCIUM 9.0 02/06/2015 0841   CALCIUM 9.3 12/12/2014 1222   ALKPHOS 94 02/06/2015 0841   AST 8 02/06/2015 0841   ALT 13 02/06/2015 0841   BILITOT 0.64 02/06/2015 0841       No results found for: LABCA2  No components found for: [id number]  No results for input(s): INR in the last 168 hours.  Urinalysis    Component Value Date/Time   LABSPEC 1.020 05/06/2009 1439   PHURINE 7.0 05/06/2009 1439   GLUCOSEU NEGATIVE 05/06/2009 1439   HGBUR NEGATIVE 05/06/2009 1439   BILIRUBINUR NEGATIVE 05/06/2009 1439   KETONESUR NEGATIVE 05/06/2009 1439   PROTEINUR NEGATIVE 05/06/2009 1439   UROBILINOGEN 0.2 05/06/2009 1439   NITRITE NEGATIVE 05/06/2009 1439   LEUKOCYTESUR  05/06/2009 1439    NEGATIVE Biochemical Testing Only. Please order routine urinalysis from main lab if confirmatory testing is needed.    STUDIES: No results found.  ASSESSMENT: 39 y.o. [place] woman s/p Right breast biopsy 11/21/2014 for a clinical T2 NX, stage 2 invasive ductal carcinoma, grade 2 or 3, estrogen and progesterone receptor negative, HER-2 equivocal, with an Mib-1 of 20%  (a) biopsy of a suspicious axillary lymph node same day was negative, but discordant  (1) neoadjuvant chemotherapy will consist of cyclophosphamide and doxorubicin in dose dense fashion 4, with onpro support, to be followed by paclitaxel weekly 12  (a) if the patient's tumor proves to be HER-2 positive on review, trastuzumab and pertuzumab will be added  with the Taxol treatments  (2) genetics testing pending  (3) definitive surgery depending on genetics result, to follow chemotherapy  (4) radiation to follow surgery  PLAN: Dariann is fatigued today. The labs were reviewed in detail, and her hgb  is down to 9.8 with an MCV of 77.3. The patient has been told that she is iron deficient in the past. I have ordered a ferritin level to be checked next week. She will receive feraheme if this level is below 100.   Marishka will return in 1 week for her 4th and final cycle of cyclophosphamide and doxorubicin. She understands and agrees with this plan. She knows the goal of treatment in her case is cure. She has been encouraged to call with any issues that might arise before her next visit here.   Laurie Panda, NP   02/06/2015 9:35 AM

## 2015-02-13 ENCOUNTER — Telehealth: Payer: Self-pay | Admitting: Nurse Practitioner

## 2015-02-13 ENCOUNTER — Other Ambulatory Visit (HOSPITAL_BASED_OUTPATIENT_CLINIC_OR_DEPARTMENT_OTHER): Payer: 59

## 2015-02-13 ENCOUNTER — Ambulatory Visit (HOSPITAL_BASED_OUTPATIENT_CLINIC_OR_DEPARTMENT_OTHER): Payer: 59 | Admitting: Nurse Practitioner

## 2015-02-13 ENCOUNTER — Encounter: Payer: Self-pay | Admitting: Nurse Practitioner

## 2015-02-13 ENCOUNTER — Ambulatory Visit (HOSPITAL_BASED_OUTPATIENT_CLINIC_OR_DEPARTMENT_OTHER): Payer: 59

## 2015-02-13 VITALS — BP 116/72 | HR 81 | Temp 98.6°F | Resp 18 | Ht 65.0 in | Wt 219.6 lb

## 2015-02-13 DIAGNOSIS — C50311 Malignant neoplasm of lower-inner quadrant of right female breast: Secondary | ICD-10-CM

## 2015-02-13 DIAGNOSIS — D649 Anemia, unspecified: Secondary | ICD-10-CM

## 2015-02-13 DIAGNOSIS — Z5111 Encounter for antineoplastic chemotherapy: Secondary | ICD-10-CM

## 2015-02-13 LAB — CBC WITH DIFFERENTIAL/PLATELET
BASO%: 0.4 % (ref 0.0–2.0)
BASOS ABS: 0 10*3/uL (ref 0.0–0.1)
EOS ABS: 0 10*3/uL (ref 0.0–0.5)
EOS%: 0.1 % (ref 0.0–7.0)
HCT: 30.8 % — ABNORMAL LOW (ref 34.8–46.6)
HEMOGLOBIN: 9.5 g/dL — AB (ref 11.6–15.9)
LYMPH%: 14.9 % (ref 14.0–49.7)
MCH: 24.4 pg — AB (ref 25.1–34.0)
MCHC: 30.8 g/dL — AB (ref 31.5–36.0)
MCV: 79 fL — AB (ref 79.5–101.0)
MONO#: 0.4 10*3/uL (ref 0.1–0.9)
MONO%: 5.1 % (ref 0.0–14.0)
NEUT#: 5.7 10*3/uL (ref 1.5–6.5)
NEUT%: 79.5 % — AB (ref 38.4–76.8)
Platelets: 180 10*3/uL (ref 145–400)
RBC: 3.9 10*6/uL (ref 3.70–5.45)
RDW: 16.4 % — AB (ref 11.2–14.5)
WBC: 7.1 10*3/uL (ref 3.9–10.3)
lymph#: 1.1 10*3/uL (ref 0.9–3.3)

## 2015-02-13 LAB — FERRITIN CHCC: FERRITIN: 151 ng/mL (ref 9–269)

## 2015-02-13 LAB — COMPREHENSIVE METABOLIC PANEL (CC13)
ALBUMIN: 3.7 g/dL (ref 3.5–5.0)
ALK PHOS: 85 U/L (ref 40–150)
ALT: 15 U/L (ref 0–55)
AST: 11 U/L (ref 5–34)
Anion Gap: 6 mEq/L (ref 3–11)
BUN: 9.6 mg/dL (ref 7.0–26.0)
CHLORIDE: 106 meq/L (ref 98–109)
CO2: 27 mEq/L (ref 22–29)
Calcium: 9 mg/dL (ref 8.4–10.4)
Creatinine: 0.7 mg/dL (ref 0.6–1.1)
GLUCOSE: 96 mg/dL (ref 70–140)
POTASSIUM: 4 meq/L (ref 3.5–5.1)
SODIUM: 139 meq/L (ref 136–145)
Total Bilirubin: 0.31 mg/dL (ref 0.20–1.20)
Total Protein: 6.6 g/dL (ref 6.4–8.3)

## 2015-02-13 MED ORDER — HEPARIN SOD (PORK) LOCK FLUSH 100 UNIT/ML IV SOLN
500.0000 [IU] | Freq: Once | INTRAVENOUS | Status: AC | PRN
  Administered 2015-02-13: 500 [IU]
  Filled 2015-02-13: qty 5

## 2015-02-13 MED ORDER — PEGFILGRASTIM 6 MG/0.6ML ~~LOC~~ PSKT
6.0000 mg | PREFILLED_SYRINGE | Freq: Once | SUBCUTANEOUS | Status: AC
  Administered 2015-02-13: 6 mg via SUBCUTANEOUS
  Filled 2015-02-13: qty 0.6

## 2015-02-13 MED ORDER — DOXORUBICIN HCL CHEMO IV INJECTION 2 MG/ML
60.0000 mg/m2 | Freq: Once | INTRAVENOUS | Status: AC
  Administered 2015-02-13: 128 mg via INTRAVENOUS
  Filled 2015-02-13: qty 64

## 2015-02-13 MED ORDER — PALONOSETRON HCL INJECTION 0.25 MG/5ML
INTRAVENOUS | Status: AC
  Filled 2015-02-13: qty 5

## 2015-02-13 MED ORDER — SODIUM CHLORIDE 0.9 % IJ SOLN
10.0000 mL | INTRAMUSCULAR | Status: DC | PRN
  Administered 2015-02-13: 10 mL
  Filled 2015-02-13: qty 10

## 2015-02-13 MED ORDER — SODIUM CHLORIDE 0.9 % IV SOLN
Freq: Once | INTRAVENOUS | Status: AC
  Administered 2015-02-13: 11:00:00 via INTRAVENOUS

## 2015-02-13 MED ORDER — FOSAPREPITANT DIMEGLUMINE INJECTION 150 MG
Freq: Once | INTRAVENOUS | Status: AC
  Administered 2015-02-13: 11:00:00 via INTRAVENOUS
  Filled 2015-02-13: qty 5

## 2015-02-13 MED ORDER — PALONOSETRON HCL INJECTION 0.25 MG/5ML
0.2500 mg | Freq: Once | INTRAVENOUS | Status: AC
  Administered 2015-02-13: 0.25 mg via INTRAVENOUS

## 2015-02-13 MED ORDER — SODIUM CHLORIDE 0.9 % IV SOLN
600.0000 mg/m2 | Freq: Once | INTRAVENOUS | Status: AC
  Administered 2015-02-13: 1280 mg via INTRAVENOUS
  Filled 2015-02-13: qty 64

## 2015-02-13 MED ORDER — HYDROCODONE-ACETAMINOPHEN 5-325 MG PO TABS: 1.0000 | ORAL_TABLET | Freq: Four times a day (QID) | ORAL | Status: DC | PRN

## 2015-02-13 NOTE — Telephone Encounter (Signed)
Appointments moved per pof and patient will get a new avs at 10/5 appointment

## 2015-02-13 NOTE — Progress Notes (Signed)
Blood return noted before, every 5cc and after Adriamycin push

## 2015-02-13 NOTE — Progress Notes (Signed)
[facility]  [phone]) [phone] [phone]) [phone]     ID: Susan Wilson DOB: July 23, 1975  MR#: [id number]  IBB#:[id number]  Patient Care Team: Donnamae Jude, MD as PCP - General (Obstetrics and Gynecology) Rolm Bookbinder, MD as Consulting Physician (General Surgery) Chauncey Cruel, MD as Consulting Physician (Oncology) Thea Silversmith, MD as Consulting Physician (Radiation Oncology) [name], RN as Registered Nurse Rockwell Germany, RN as Registered Nurse Holley Bouche, NP as Nurse Practitioner (Nurse Practitioner) PCP: Donnamae Jude, MD OTHER MD:  CHIEF COMPLAINT: Estrogen receptor negative breast cancer  CURRENT TREATMENT: Neoadjuvant chemotherapy   BREAST CANCER HISTORY: Susan Wilson herself noted a mass in her right breast sometime around April. Initially she thought it might be related to menstruation, but as it did not change and eventually became tender, she brought it to her physician's attention. On 11/20/2014 patient underwent bilateral diagnostic mammography with tomosynthesis and right breast ultrasonography at the breast Center. The breast density was category B. There was a hyperdense mass in the right lower inner quadrant associated with skin thickening. There was also a 5 mm nodule posteriorly at the 8:30 o'clock position in the right breast. There were several hyperdense nodules in the right axilla. On physical exam there was a firm fixed mass in the right breast at the 5:00 position. By ultrasound this was lobulated and appear to involve the skin. It measures up to 4.1 cm. There was no sonographic correlation to the 5 mm nodule seen in a different area of the right breast. The right axilla showed 3 hypervascular lymph nodes with prominent cortical thickening, measuring less than 1.5 cm.  On 11/21/2014 the patient underwent right breast biopsy (5:00 mass) and biopsy of one of the suspicious right axillary lymph nodes. 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  The patient's subsequent history is as detailed below  INTERVAL HISTORY: Susan Wilson returns today for follow up of her breast cancer, accompanied by her husband. Today is day 1, cycle 4 of cyclophosphamide and doxorubicin, with neulasta given on day 2 for granulocyte support.   REVIEW OF SYSTEMS: Susan Wilson has improved some this week. She denies fevers or chills. Her nausea is getting better, and her taste sensation is back. She is moving her bowels well. She denies mouth sores, rashes, or neuropathy symptoms. She has no headaches, dizziness, or vision changes. She denies shortness of breath, chest pain, cough, or palpitations. She does occasionally need the norco for neulata related pain. A detailed review of systems is otherwise stable.  PAST MEDICAL HISTORY: Past Medical History  Diagnosis Date  . 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 Breast cancer   . Hernia, umbilical   . Back pain   . Headache   . Anemia   . Hernia, umbilical   . Family history of breast cancer     PAST SURGICAL HISTORY: Past Surgical History  Procedure Laterality Date  . Portacath placement Right 12/16/2014    Procedure: INSERTION PORT-A-CATH WITH ULTRASOUND;  Surgeon: Rolm Bookbinder, MD;  Location: [facility];  Service: General;  Laterality: Right;    FAMILY HISTORY Family History  Problem Relation Age of Onset  . Mesothelioma Maternal Grandfather     asbestos exposure, died in his 61s  .  Heart defect Sister 0  . Heart disease Paternal Aunt   . Heart disease Paternal Uncle   . Breast cancer Paternal Grandmother   . Diabetes Paternal Grandmother   . Cancer Paternal Grandfather     NOS  . Colon cancer Other 46     MGMs brother with colon cancer  . Cancer Other     several of MGF's sisters with cancer NOS  . Liver cancer Other     MGF's brother   the patient's parents are living, her father being 23 and her mother 27 as of July 2016. The patient had no brothers. One sister died at age 50 from cardiac problems. The other sister is in good health. On the maternal side there is a history of colon cancer and an uncle age 90, liver cancer in a great uncle and mesothelioma in the maternal grandfather.  GYNECOLOGIC HISTORY:  No LMP recorded. Menarche age 5. The patient is GX P2. 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 The daughters are [place] and Lovie Macadamia, age 52 and 34. The patient attends a local [place]: Not in place   HEALTH MAINTENANCE: Social History  Substance Use Topics  . Smoking status: Never Smoker   . Smokeless tobacco: Never Used  . Alcohol Use: Yes     Comment: rarely     Colonoscopy:  PAP: 2014  Bone density:  Lipid panel:  No Known Allergies  Current Outpatient Prescriptions  Medication Sig Dispense Refill  . dexamethasone (DECADRON) 4 MG tablet Take 2 tablets by mouth once a day on the day after chemotherapy and then take 2 tablets two times a day for 2 days. Take with food. 30 tablet 1  . lidocaine-prilocaine (EMLA) cream Apply to affected area once 30 g 3  . LORazepam (ATIVAN) 0.5 MG tablet Take 1 tablet (0.5 mg total) by mouth at bedtime. 30 tablet 0  . ondansetron (ZOFRAN) 8 MG tablet Take 1 tablet (8 mg total) by mouth 2 (two) times daily as needed. Start on the third day after chemotherapy. 30 tablet 1  . prochlorperazine (COMPAZINE) 10 MG tablet Take 1 tablet (10 mg total) by mouth every 6 (six) hours as needed (Nausea or vomiting). 30 tablet 1  . HYDROcodone-acetaminophen (NORCO/VICODIN) 5-325 MG tablet Take 1 tablet  by mouth every 6 (six) hours as needed for moderate pain. 20 tablet 0  . ibuprofen (ADVIL,MOTRIN) 200 MG tablet Take 400 mg by mouth every 6 (six) hours as needed (for pain).     . naproxen (NAPROSYN) 500 MG tablet Take 1 tablet (500 mg total) by mouth 2 (two) times daily. (Patient not taking: Reported on 01/16/2015) 30 tablet 0  . oxyCODONE-acetaminophen (PERCOCET) 10-325 MG per tablet Take 1 tablet by mouth every 6 (six) hours as needed for pain. (Patient not taking: Reported on 01/23/2015) 10 tablet 0   No current facility-administered medications for this visit.    OBJECTIVE: Young African-American woman who appears well Filed Vitals:   02/13/15 0938  BP: 116/72  Pulse: 81  Temp: 98.6 F (37 C)  Resp: 18     Body mass index is 36.54 kg/(m^2).    ECOG FS:0 - Asymptomatic  Skin: warm, dry  HEENT: sclerae anicteric, conjunctivae pink, oropharynx clear. No thrush or mucositis.  Lymph Nodes: No cervical or supraclavicular lymphadenopathy  Lungs: clear to auscultation bilaterally, no rales, wheezes,  or rhonci  Heart: regular rate and rhythm  Abdomen: round, soft, non tender, positive bowel sounds  Musculoskeletal: No focal spinal tenderness, no peripheral edema  Neuro: non focal, well oriented, positive affect  Breasts: deferred  LAB RESULTS:  CMP     Component Value Date/Time   NA 139 02/13/2015 0918   NA 136 12/12/2014 1222   K 4.0 02/13/2015 0918   K 4.2 12/12/2014 1222   CL 102 12/12/2014 1222   CO2 27 02/13/2015 0918   CO2 25 12/12/2014 1222   GLUCOSE 96 02/13/2015 0918   GLUCOSE 98 12/12/2014 1222   BUN 9.6 02/13/2015 0918   BUN 14 12/12/2014 1222   CREATININE 0.7 02/13/2015 0918   CREATININE 0.69 12/12/2014 1222   CALCIUM 9.0 02/13/2015 0918   CALCIUM 9.3 12/12/2014 1222   PROT 6.6 02/13/2015 0918   ALBUMIN 3.7 02/13/2015 0918   AST 11 02/13/2015 0918   ALT 15 02/13/2015 0918   ALKPHOS 85 02/13/2015 0918   BILITOT 0.31 02/13/2015 0918   GFRNONAA >60 12/12/2014  1222   GFRAA >60 12/12/2014 1222    INo results found for: SPEP, UPEP  Lab Results  Component Value Date   WBC 7.1 02/13/2015   NEUTROABS 5.7 02/13/2015   HGB 9.5* 02/13/2015   HCT 30.8* 02/13/2015   MCV 79.0* 02/13/2015   PLT 180 02/13/2015      Chemistry      Component Value Date/Time   NA 139 02/13/2015 0918   NA 136 12/12/2014 1222   K 4.0 02/13/2015 0918   K 4.2 12/12/2014 1222   CL 102 12/12/2014 1222   CO2 27 02/13/2015 0918   CO2 25 12/12/2014 1222   BUN 9.6 02/13/2015 0918   BUN 14 12/12/2014 1222   CREATININE 0.7 02/13/2015 0918   CREATININE 0.69 12/12/2014 1222      Component Value Date/Time   CALCIUM 9.0 02/13/2015 0918   CALCIUM 9.3 12/12/2014 1222   ALKPHOS 85 02/13/2015 0918   AST 11 02/13/2015 0918   ALT 15 02/13/2015 0918   BILITOT 0.31 02/13/2015 0918       No results found for: LABCA2  No components found for: LABCA125  No results for input(s): INR in the last 168 hours.  Urinalysis    Component Value Date/Time   LABSPEC 1.020 05/06/2009 1439   PHURINE 7.0 05/06/2009 1439   GLUCOSEU NEGATIVE 05/06/2009 1439   HGBUR NEGATIVE 05/06/2009 1439   BILIRUBINUR NEGATIVE 05/06/2009 1439   KETONESUR NEGATIVE 05/06/2009 1439   PROTEINUR NEGATIVE 05/06/2009 1439   UROBILINOGEN 0.2 05/06/2009 1439   NITRITE NEGATIVE 05/06/2009 1439   LEUKOCYTESUR  05/06/2009 1439    NEGATIVE Biochemical Testing Only. Please order routine urinalysis from main lab if confirmatory testing is needed.    STUDIES: No results found.  ASSESSMENT: 39 y.o. [place] woman s/p Right breast biopsy 11/21/2014 for a clinical T2 NX, stage 2 invasive ductal carcinoma, grade 2 or 3, estrogen and progesterone receptor negative, HER-2 equivocal, with an Mib-1 of 20%  (a) biopsy of a suspicious axillary lymph node same day was negative, but discordant  (1) neoadjuvant chemotherapy will consist of cyclophosphamide and doxorubicin in dose dense fashion 4, with onpro support, to  be followed by paclitaxel weekly 12  (a) if the patient's tumor proves to be HER-2 positive on review, trastuzumab and pertuzumab will be added with the Taxol treatments  (2) genetics testing pending  (3) definitive surgery depending on genetics result, to follow chemotherapy  (4) radiation  to follow surgery  PLAN: Susan Wilson looks and feels well today. The labs were reviewed in detail, and her hgb is down to 9.5 with an MCV of 79.0. The ferritin results are still pending, but there is a chance she would benefit from iron placement therapy. She will proceed with cycle 4 of cyclophosphamide and doxorubicin as planned today. I have explained to her that outside of stage IV disease, we are not fond of continuing narcotic therapy. She does use the norco she has for her neulasta related pain. I have refilled a 1 time prescription for a limited amount of pills, and she understands that this will be her last neulasta injection so any further refills will be unnecessary until surgery. She has been advised to rely on NSAIDs or tylenol.   She will have a midpoint breast MRI next week.  Susan Wilson will return in 1 week for labs and a nadrir visit with Dr. Jana Hakim. During this visit they will discuss her next chemo regimen, paclitaxel weekly x 12. She understands and agrees with this plan. She knows the goal of treatment in her case is cure. She has been encouraged to call with any issues that might arise before her next visit here.   Laurie Panda, NP   02/13/2015 10:20 AM

## 2015-02-13 NOTE — Patient Instructions (Signed)
[facility] Discharge Instructions for Patients Receiving Chemotherapy  Today you received the following chemotherapy agents:Adriamycin and Cytoxan   To help prevent nausea and vomiting after your treatment, we encourage you to take your nausea medication as directed.    If you develop nausea and vomiting that is not controlled by your nausea medication, call the clinic.   BELOW ARE SYMPTOMS THAT SHOULD BE REPORTED IMMEDIATELY:  *FEVER GREATER THAN 100.5 F  *CHILLS WITH OR WITHOUT FEVER  NAUSEA AND VOMITING THAT IS NOT CONTROLLED WITH YOUR NAUSEA MEDICATION  *UNUSUAL SHORTNESS OF BREATH  *UNUSUAL BRUISING OR BLEEDING  TENDERNESS IN MOUTH AND THROAT WITH OR WITHOUT PRESENCE OF ULCERS  *URINARY PROBLEMS  *BOWEL PROBLEMS  UNUSUAL RASH Items with * indicate a potential emergency and should be followed up as soon as possible.  Feel free to call the clinic you have any questions or concerns. The clinic phone number is [phone].  Please show the [facility] at check-in to the Emergency Department and triage nurse.

## 2015-02-16 ENCOUNTER — Ambulatory Visit (HOSPITAL_COMMUNITY)
Admission: RE | Admit: 2015-02-16 | Discharge: 2015-02-16 | Disposition: A | Discharge status: Home / Self Care | Payer: 59 | Source: Ambulatory Visit | Attending: Nurse Practitioner | Admitting: Nurse Practitioner

## 2015-02-16 ENCOUNTER — Inpatient Hospital Stay (HOSPITAL_COMMUNITY)
Admission: RE | Admit: 2015-02-16 | Discharge: 2015-02-16 | Disposition: A | Discharge status: Home / Self Care | Payer: 59 | Source: Ambulatory Visit

## 2015-02-16 ENCOUNTER — Other Ambulatory Visit: Payer: Self-pay

## 2015-02-16 ENCOUNTER — Other Ambulatory Visit: Payer: Self-pay | Admitting: Nurse Practitioner

## 2015-02-16 ENCOUNTER — Telehealth: Payer: Self-pay

## 2015-02-16 DIAGNOSIS — C50311 Malignant neoplasm of lower-inner quadrant of right female breast: Secondary | ICD-10-CM

## 2015-02-16 NOTE — Progress Notes (Signed)
Pt came to  mri for breast mri. Spoke with Dr Radford Pax radiologist who agree due to pt's breast size. Pt should return to [facility] for her breast mri due to size of pt's breast. I personally spoke with Zigmund Daniel Dr Zettie Pho nurse and informed her of this. And she verbally acknowledged this. She state she would let scheduling know and they would get her on [facility] schedule.

## 2015-02-17 ENCOUNTER — Telehealth: Payer: Self-pay | Admitting: Oncology

## 2015-02-17 ENCOUNTER — Other Ambulatory Visit: Payer: Self-pay | Admitting: Nurse Practitioner

## 2015-02-17 ENCOUNTER — Ambulatory Visit
Admission: RE | Admit: 2015-02-17 | Discharge: 2015-02-17 | Disposition: A | Discharge status: Home / Self Care | Payer: 59 | Source: Ambulatory Visit | Attending: Nurse Practitioner | Admitting: Nurse Practitioner

## 2015-02-17 DIAGNOSIS — C50311 Malignant neoplasm of lower-inner quadrant of right female breast: Secondary | ICD-10-CM

## 2015-02-17 MED ORDER — GADOBENATE DIMEGLUMINE 529 MG/ML IV SOLN
20.0000 mL | Freq: Once | INTRAVENOUS | Status: AC | PRN
  Administered 2015-02-17: 20 mL via INTRAVENOUS

## 2015-02-17 NOTE — Telephone Encounter (Signed)
Added appt per pof for NOV...the patient will get new sched in oct

## 2015-02-18 ENCOUNTER — Ambulatory Visit (HOSPITAL_BASED_OUTPATIENT_CLINIC_OR_DEPARTMENT_OTHER): Payer: 59 | Admitting: Oncology

## 2015-02-18 ENCOUNTER — Other Ambulatory Visit (HOSPITAL_BASED_OUTPATIENT_CLINIC_OR_DEPARTMENT_OTHER): Payer: 59

## 2015-02-18 VITALS — BP 111/68 | HR 82 | Temp 98.4°F | Resp 18 | Ht 65.0 in | Wt 216.6 lb

## 2015-02-18 DIAGNOSIS — C50311 Malignant neoplasm of lower-inner quadrant of right female breast: Secondary | ICD-10-CM | POA: Diagnosis not present

## 2015-02-18 DIAGNOSIS — Z171 Estrogen receptor negative status [ER-]: Secondary | ICD-10-CM

## 2015-02-18 LAB — CBC WITH DIFFERENTIAL/PLATELET
BASO%: 0.4 % (ref 0.0–2.0)
Basophils Absolute: 0 10*3/uL (ref 0.0–0.1)
EOS%: 0.6 % (ref 0.0–7.0)
Eosinophils Absolute: 0.1 10*3/uL (ref 0.0–0.5)
HCT: 31 % — ABNORMAL LOW (ref 34.8–46.6)
HGB: 9.8 g/dL — ABNORMAL LOW (ref 11.6–15.9)
LYMPH#: 0.9 10*3/uL (ref 0.9–3.3)
LYMPH%: 9.5 % — AB (ref 14.0–49.7)
MCH: 24.4 pg — ABNORMAL LOW (ref 25.1–34.0)
MCHC: 31.6 g/dL (ref 31.5–36.0)
MCV: 77.2 fL — ABNORMAL LOW (ref 79.5–101.0)
MONO#: 0 10*3/uL — AB (ref 0.1–0.9)
MONO%: 0.4 % (ref 0.0–14.0)
NEUT%: 89.1 % — ABNORMAL HIGH (ref 38.4–76.8)
NEUTROS ABS: 8.3 10*3/uL — AB (ref 1.5–6.5)
PLATELETS: 162 10*3/uL (ref 145–400)
RBC: 4.01 10*6/uL (ref 3.70–5.45)
RDW: 17 % — ABNORMAL HIGH (ref 11.2–14.5)
WBC: 9.3 10*3/uL (ref 3.9–10.3)

## 2015-02-18 LAB — COMPREHENSIVE METABOLIC PANEL (CC13)
ALK PHOS: 108 U/L (ref 40–150)
ALT: 13 U/L (ref 0–55)
ANION GAP: 7 meq/L (ref 3–11)
AST: 8 U/L (ref 5–34)
Albumin: 3.5 g/dL (ref 3.5–5.0)
BUN: 15.2 mg/dL (ref 7.0–26.0)
CO2: 30 meq/L — AB (ref 22–29)
Calcium: 8.6 mg/dL (ref 8.4–10.4)
Chloride: 104 mEq/L (ref 98–109)
Creatinine: 0.7 mg/dL (ref 0.6–1.1)
EGFR: >90 mL/min/{1.73_m2} (ref >90)
GLUCOSE: 96 mg/dL (ref 70–140)
Potassium: 3.5 mEq/L (ref 3.5–5.1)
Sodium: 141 mEq/L (ref 136–145)
TOTAL PROTEIN: 6.1 g/dL — AB (ref 6.4–8.3)
Total Bilirubin: 0.6 mg/dL (ref 0.20–1.20)

## 2015-02-18 NOTE — Progress Notes (Signed)
[place]  [phone] [phone]     ID: Susan Wilson DOB: 03-25-1976  MR#: [id number]  PVX#:[id number]  Patient Care Team: Donnamae Jude, MD as PCP - General (Obstetrics and Gynecology) Rolm Bookbinder, MD as Consulting Physician (General Surgery) Chauncey Cruel, MD as Consulting Physician (Oncology) Thea Silversmith, MD as Consulting Physician (Radiation Oncology) [name], RN as Registered Nurse Rockwell Germany, RN as Registered Nurse Holley Bouche, NP as Nurse Practitioner (Nurse Practitioner) PCP: Donnamae Jude, MD OTHER MD:  CHIEF COMPLAINT: Estrogen receptor negative breast cancer  CURRENT TREATMENT: Neoadjuvant chemotherapy   BREAST CANCER HISTORY: From the original intake note:  [name] herself noted a mass in her right breast sometime around April. Initially she thought it might be related to menstruation, but as it did not change and eventually became tender, she brought it to her physician's attention. On 11/20/2014 patient underwent bilateral diagnostic mammography with tomosynthesis and right breast ultrasonography at the [facility] Center. The breast density was category B. There was a hyperdense mass in the right lower inner quadrant associated with skin thickening. There was also a 5 mm nodule posteriorly at the 8:30 o'clock position in the right breast. There were several hyperdense nodules in the right axilla. On physical exam there was a firm fixed mass in the right breast at the 5:00 position. By ultrasound this was lobulated and appear to involve the skin. It measures up to 4.1 cm. There was no sonographic correlation to the 5 mm nodule seen in a different area of the right breast. The right axilla showed 3 hypervascular lymph nodes with prominent cortical thickening, measuring less than 1.5 cm.  On 11/21/2014 the patient underwent right breast biopsy (5:00 mass) and biopsy of one of the suspicious right  axillary lymph nodes. 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  The patient's subsequent history is as detailed below  INTERVAL HISTORY: Susan Wilson returns today for follow up of her breast cancer, accompanied by her husband. Today is day 8, cycle 4 of 4 planned cycles of cyclophosphamide and doxorubicin, with neulasta given on day 2 for granulocyte support, to be followed by 12 weekly doses of carboplatin and paclitaxel.  Today we reviewed the MRI she had 02/17/2015. It shows a very good initial response. She did not want a photograph images but "I'll remember them"  REVIEW OF SYSTEMS: Lorenda tolerated the first part of her chemotherapy moderately well. She had problems with nausea, but never vomited. She is very sensitive to smells. Her weight is stable. She developed bone aches after each Neulasta shot. She had to take hydrocodone for that. She describes herself is very fatigued. She is having problems with insomnia, which are helped by Ativan. She never developed cough, phlegm production, pleurisy, rash, or significant change in bowel habits. She did "finally" have. September 21. She is not having hot flashes. A detailed review of systems today was otherwise stable  PAST MEDICAL HISTORY: Past Medical History  Diagnosis Date  . 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 Breast cancer   . Hernia, umbilical   . Back pain   . Headache   . Anemia   . Hernia, umbilical   . Family history of breast cancer  PAST SURGICAL HISTORY: Past Surgical History  Procedure Laterality Date  . Portacath placement Right 12/16/2014    Procedure: INSERTION PORT-A-CATH WITH ULTRASOUND;  Surgeon: Rolm Bookbinder, MD;  Location: [facility];  Service:  General;  Laterality: Right;    FAMILY HISTORY Family History  Problem Relation Age of Onset  . Mesothelioma Maternal Grandfather     asbestos exposure, died in his 50s  . Heart defect Sister 0  . Heart disease Paternal Aunt   . Heart disease Paternal Uncle   . Breast cancer Paternal Grandmother   . Diabetes Paternal Grandmother   . Cancer Paternal Grandfather     NOS  . Colon cancer Other 40    MGMs brother with colon cancer  . Cancer Other     several of MGF's sisters with cancer NOS  . Liver cancer Other     MGF's brother   the patient's parents are living, her father being 74 and her mother 9 as of July 2016. The patient had no brothers. One sister died at age 60 from cardiac problems. The other sister is in good health. On the maternal side there is a history of colon cancer and an uncle age 18, liver cancer in a great uncle and mesothelioma in the maternal grandfather.  GYNECOLOGIC HISTORY:  No LMP recorded. Menarche age 41. The patient is GX P2. She still having regular periods  SOCIAL HISTORY:  Marca works in Therapist, art for [employer]. Her husband Antrell works for [employer]. The daughters are [place] and Lovie Macadamia, age 40 and 75. The patient attends a local [place]: Not in place   HEALTH MAINTENANCE: Social History  Substance Use Topics  . Smoking status: Never Smoker   . Smokeless tobacco: Never Used  . Alcohol Use: Yes     Comment: rarely     Colonoscopy:  PAP: 2014  Bone density:  Lipid panel:  No Known Allergies  Current Outpatient Prescriptions  Medication Sig Dispense Refill  . HYDROcodone-acetaminophen (NORCO/VICODIN) 5-325 MG tablet Take 1 tablet by mouth every 6 (six) hours as needed for moderate pain. 20 tablet 0  . ibuprofen (ADVIL,MOTRIN) 200 MG tablet Take 400 mg by mouth every 6 (six) hours as needed (for pain).      No current facility-administered medications for this visit.     OBJECTIVE: Young African-American woman in no acute distress Filed Vitals:   02/18/15 1046  BP: 111/68  Pulse: 82  Temp: 98.4 F (36.9 C)  Resp: 18     Body mass index is 36.04 kg/(m^2).    ECOG FS:1 - Symptomatic but completely ambulatory  Sclerae unicteric, pupils round and equal Oropharynx clear and moist-- no thrush or other lesions No cervical or supraclavicular adenopathy Lungs no rales or rhonchi Heart regular rate and rhythm Abd soft, nontender, positive bowel sounds MSK no focal spinal tenderness, no upper extremity lymphedema Neuro: nonfocal, well oriented, appropriate affect Breasts: Deferred  LAB RESULTS:  CMP     Component Value Date/Time   NA 141 02/18/2015 1031   NA 136 12/12/2014 1222   K 3.5 02/18/2015 1031   K 4.2 12/12/2014 1222   CL 102 12/12/2014 1222   CO2 30* 02/18/2015 1031   CO2 25 12/12/2014 1222   GLUCOSE 96 02/18/2015 1031   GLUCOSE 98 12/12/2014 1222   BUN 15.2 02/18/2015 1031   BUN 14 12/12/2014 1222   CREATININE 0.7 02/18/2015 1031   CREATININE 0.69 12/12/2014 1222   CALCIUM  8.6 02/18/2015 1031   CALCIUM 9.3 12/12/2014 1222   PROT 6.1* 02/18/2015 1031   ALBUMIN 3.5 02/18/2015 1031   AST 8 02/18/2015 1031   ALT 13 02/18/2015 1031   ALKPHOS 108 02/18/2015 1031   BILITOT 0.60 02/18/2015 1031   GFRNONAA >60 12/12/2014 1222   GFRAA >60 12/12/2014 1222    INo results found for: SPEP, UPEP  Lab Results  Component Value Date   WBC 9.3 02/18/2015   NEUTROABS 8.3* 02/18/2015   HGB 9.8* 02/18/2015   HCT 31.0* 02/18/2015   MCV 77.2* 02/18/2015   PLT 162 02/18/2015      Chemistry      Component Value Date/Time   NA 141 02/18/2015 1031   NA 136 12/12/2014 1222   K 3.5 02/18/2015 1031   K 4.2 12/12/2014 1222   CL 102 12/12/2014 1222   CO2 30* 02/18/2015 1031   CO2 25 12/12/2014 1222   BUN 15.2 02/18/2015 1031   BUN 14 12/12/2014 1222   CREATININE 0.7 02/18/2015 1031   CREATININE 0.69 12/12/2014 1222      Component  Value Date/Time   CALCIUM 8.6 02/18/2015 1031   CALCIUM 9.3 12/12/2014 1222   ALKPHOS 108 02/18/2015 1031   AST 8 02/18/2015 1031   ALT 13 02/18/2015 1031   BILITOT 0.60 02/18/2015 1031       No results found for: LABCA2  No components found for: LABCA125  No results for input(s): INR in the last 168 hours.  Urinalysis    Component Value Date/Time   LABSPEC 1.020 05/06/2009 1439   PHURINE 7.0 05/06/2009 1439   GLUCOSEU NEGATIVE 05/06/2009 1439   HGBUR NEGATIVE 05/06/2009 1439   BILIRUBINUR NEGATIVE 05/06/2009 1439   KETONESUR NEGATIVE 05/06/2009 1439   PROTEINUR NEGATIVE 05/06/2009 1439   UROBILINOGEN 0.2 05/06/2009 1439   NITRITE NEGATIVE 05/06/2009 1439   LEUKOCYTESUR  05/06/2009 1439    NEGATIVE Biochemical Testing Only. Please order routine urinalysis from main lab if confirmatory testing is needed.    STUDIES: Mr Breast Bilateral W Wo Contrast  02/17/2015   CLINICAL DATA:  Patient with recent diagnosis of right breast cancer, undergoing neoadjuvant chemotherapy.  LABS:  None.  EXAM: BILATERAL BREAST MRI WITH AND WITHOUT CONTRAST  TECHNIQUE: Multiplanar, multisequence MR images of both breasts were obtained prior to and following the intravenous administration of 20 ml of MultiHance.  THREE-DIMENSIONAL MR IMAGE RENDERING ON INDEPENDENT WORKSTATION:  Three-dimensional MR images were rendered by post-processing of the original MR data on an independent workstation. The three-dimensional MR images were interpreted, and findings are reported in the following complete MRI report for this study. Three dimensional images were evaluated at the independent DynaCad workstation  COMPARISON:  MRI of the breast dated 12/05/2014, mammogram and ultrasound dated 11/21/2014 and Previous mammograms.  FINDINGS: Breast composition: b. Scattered fibroglandular tissue.  Background parenchymal enhancement: Moderate.  Right breast: The right breast 5 o'clock tumor has significantly decreased in size,  and is less avid demonstrating predominantly progressive kinetics of enhancement. It now measures 3.9 by 2.9 by 1.6 cm. Continued extension to the overlying skin is noted. However the pectoralis muscle appears involved.  Left breast: No mass or abnormal enhancement. Signal void artifact is seen from tissue marker in the left breast lower outer quadrant, anterior depth, from prior benign core needle biopsy.  Lymph nodes: No abnormal appearing lymph nodes.  Ancillary findings:  None.  IMPRESSION: Interval decrease in the size and intensity of enhancement of the known right breast 5  o'clock malignancy, consistent with good response to neoadjuvant chemotherapy.  RECOMMENDATION: Continue with the plan of care.  BI-RADS CATEGORY  6: Known biopsy-proven malignancy.   Electronically Signed   By: Fidela Salisbury M.D.   On: 02/17/2015 15:46   Mr Attempted Daymon Larsen Report  02/16/2015   This examination belongs to an outside facility and is stored  here for comparison purposes only.  Contact the originating outside  institution for any associated report or interpretation.   ASSESSMENT: 39 y.o. [place] woman s/p Right breast biopsy 11/21/2014 for a clinical T2 NX, stage 2 invasive ductal carcinoma, grade 2 or 3, estrogen and progesterone receptor negative, HER-2 equivocal, with an Mib-1 of 20%  (a) biopsy of a suspicious axillary lymph node same day was negative, but discordant  (b) review of 80 additional tumor cells by FISH still showed HER-2 not amplified; tumor should be treated as a triple negative  (1) neoadjuvant chemotherapy started 01/01/2015 consisting of cyclophosphamide and doxorubicin in dose dense fashion 4, with onpro support, completed 02/13/2015, to be followed by paclitaxel/ carboplatin weekly 12   (2) genetics testing 12/15/2014 through the Breast/Ovarian gene panel offered by [facility] found no deleterious mutations in ATM, BARD1, BRCA1, BRCA2, BRIP1, CDH1, CHEK2, EPCAM, FANCC, MLH1, MSH2,  MSH6, NBN, PALB2, PMS2, PTEN, RAD51C, RAD51D, TP53, and XRCC2  (3) definitive surgery depending on genetics result, to follow chemotherapy  (4) radiation to follow surgery  PLAN: Raphaela has completed the first half of her chemotherapy and has had a significant response. I am hopeful as we proceed to the second-half she will eventually achieve a complete radiologic response.  Her cancer is triple negative. It is estrogen and progesterone receptor positive and HER-2 is equivocal, which means it is not amplified.  Accordingly she should be treated with both paclitaxel and carboplatin, which is the plan. Today we discussed the possible toxicities, side effects and complications of these agents. She understands she will not need onpro, which is something she is very happy about.  For the first cycle I am asking her to continue the antinausea as before. If she has absolutely no nausea problems a weekend at taper down the dexamethasone so the second cycle he would be only 4 mg twice daily and after that probably omitted altogether. We can lean on the ondansetron and prochlorperazine instead.  She would prefer to be treated on Thursdays. Some of her treatments are on Wednesdays and Fridays because of lack of availability. I have looked for opportunities to accommodate her wishes but I'm not finding any way to do this. We will discuss this at her next visit here and if it is impossible for her to come on other days and Thursdays we may have to see if one of the other advanced practice providers could accommodate her.  Otherwise we are starting cycle 1 of Taxol and carbo October 13. She knows to call for any problems that may develop before her next visit here. Chauncey Cruel, MD   02/18/2015 11:39 AM

## 2015-02-19 NOTE — Telephone Encounter (Signed)
error 

## 2015-02-25 ENCOUNTER — Encounter: Payer: Self-pay | Admitting: *Deleted

## 2015-02-25 NOTE — Progress Notes (Signed)
Spoke to pt and discussed new order has been placed for her Taxol treatments to continue on Thursdays.

## 2015-02-26 ENCOUNTER — Other Ambulatory Visit: Payer: 59

## 2015-02-26 ENCOUNTER — Ambulatory Visit (HOSPITAL_BASED_OUTPATIENT_CLINIC_OR_DEPARTMENT_OTHER): Payer: 59

## 2015-02-26 ENCOUNTER — Ambulatory Visit: Payer: 59 | Admitting: Nurse Practitioner

## 2015-02-26 ENCOUNTER — Ambulatory Visit (HOSPITAL_BASED_OUTPATIENT_CLINIC_OR_DEPARTMENT_OTHER): Payer: 59 | Admitting: Nurse Practitioner

## 2015-02-26 ENCOUNTER — Other Ambulatory Visit (HOSPITAL_BASED_OUTPATIENT_CLINIC_OR_DEPARTMENT_OTHER): Payer: 59

## 2015-02-26 ENCOUNTER — Encounter: Payer: Self-pay | Admitting: Nurse Practitioner

## 2015-02-26 VITALS — BP 107/69 | HR 81 | Temp 98.4°F | Resp 18 | Ht 65.0 in | Wt 217.1 lb

## 2015-02-26 VITALS — BP 106/68 | HR 86 | Temp 98.5°F | Resp 18

## 2015-02-26 DIAGNOSIS — C50311 Malignant neoplasm of lower-inner quadrant of right female breast: Secondary | ICD-10-CM | POA: Diagnosis not present

## 2015-02-26 DIAGNOSIS — Z171 Estrogen receptor negative status [ER-]: Secondary | ICD-10-CM | POA: Diagnosis not present

## 2015-02-26 DIAGNOSIS — Z5111 Encounter for antineoplastic chemotherapy: Secondary | ICD-10-CM | POA: Diagnosis not present

## 2015-02-26 LAB — CBC WITH DIFFERENTIAL/PLATELET
BASO%: 0.6 % (ref 0.0–2.0)
Basophils Absolute: 0.1 10*3/uL (ref 0.0–0.1)
EOS ABS: 0 10*3/uL (ref 0.0–0.5)
EOS%: 0.1 % (ref 0.0–7.0)
HEMATOCRIT: 30.7 % — AB (ref 34.8–46.6)
HGB: 9.7 g/dL — ABNORMAL LOW (ref 11.6–15.9)
LYMPH#: 1.3 10*3/uL (ref 0.9–3.3)
LYMPH%: 12.1 % — AB (ref 14.0–49.7)
MCH: 24.6 pg — ABNORMAL LOW (ref 25.1–34.0)
MCHC: 31.5 g/dL (ref 31.5–36.0)
MCV: 78.2 fL — AB (ref 79.5–101.0)
MONO#: 0.7 10*3/uL (ref 0.1–0.9)
MONO%: 6 % (ref 0.0–14.0)
NEUT%: 81.2 % — AB (ref 38.4–76.8)
NEUTROS ABS: 9.1 10*3/uL — AB (ref 1.5–6.5)
PLATELETS: 180 10*3/uL (ref 145–400)
RBC: 3.93 10*6/uL (ref 3.70–5.45)
RDW: 18.1 % — ABNORMAL HIGH (ref 11.2–14.5)
WBC: 11.2 10*3/uL — ABNORMAL HIGH (ref 3.9–10.3)

## 2015-02-26 LAB — COMPREHENSIVE METABOLIC PANEL (CC13)
ALT: 15 U/L (ref 0–55)
ANION GAP: 7 meq/L (ref 3–11)
AST: 10 U/L (ref 5–34)
Albumin: 4 g/dL (ref 3.5–5.0)
Alkaline Phosphatase: 90 U/L (ref 40–150)
BUN: 13 mg/dL (ref 7.0–26.0)
CHLORIDE: 108 meq/L (ref 98–109)
CO2: 26 meq/L (ref 22–29)
Calcium: 9.1 mg/dL (ref 8.4–10.4)
Creatinine: 0.7 mg/dL (ref 0.6–1.1)
Glucose: 91 mg/dl (ref 70–140)
POTASSIUM: 4 meq/L (ref 3.5–5.1)
Sodium: 142 mEq/L (ref 136–145)
TOTAL PROTEIN: 6.8 g/dL (ref 6.4–8.3)

## 2015-02-26 MED ORDER — DEXTROSE 5 % IV SOLN
80.0000 mg/m2 | Freq: Once | INTRAVENOUS | Status: AC
  Administered 2015-02-26: 174 mg via INTRAVENOUS
  Filled 2015-02-26: qty 29

## 2015-02-26 MED ORDER — SODIUM CHLORIDE 0.9 % IV SOLN
Freq: Once | INTRAVENOUS | Status: AC
  Administered 2015-02-26: 15:00:00 via INTRAVENOUS
  Filled 2015-02-26: qty 8

## 2015-02-26 MED ORDER — HEPARIN SOD (PORK) LOCK FLUSH 100 UNIT/ML IV SOLN
500.0000 [IU] | Freq: Once | INTRAVENOUS | Status: AC | PRN
  Administered 2015-02-26: 500 [IU]
  Filled 2015-02-26: qty 5

## 2015-02-26 MED ORDER — SODIUM CHLORIDE 0.9 % IJ SOLN
10.0000 mL | INTRAMUSCULAR | Status: DC | PRN
  Administered 2015-02-26: 10 mL
  Filled 2015-02-26: qty 10

## 2015-02-26 MED ORDER — SODIUM CHLORIDE 0.9 % IV SOLN
300.0000 mg | Freq: Once | INTRAVENOUS | Status: AC
  Administered 2015-02-26: 300 mg via INTRAVENOUS
  Filled 2015-02-26: qty 30

## 2015-02-26 MED ORDER — FAMOTIDINE IN NACL 20-0.9 MG/50ML-% IV SOLN
20.0000 mg | Freq: Once | INTRAVENOUS | Status: AC
  Administered 2015-02-26: 20 mg via INTRAVENOUS

## 2015-02-26 MED ORDER — SODIUM CHLORIDE 0.9 % IV SOLN
Freq: Once | INTRAVENOUS | Status: AC
  Administered 2015-02-26: 15:00:00 via INTRAVENOUS

## 2015-02-26 MED ORDER — DIPHENHYDRAMINE HCL 50 MG/ML IJ SOLN
50.0000 mg | Freq: Once | INTRAMUSCULAR | Status: AC
  Administered 2015-02-26: 50 mg via INTRAVENOUS

## 2015-02-26 MED ORDER — DIPHENHYDRAMINE HCL 50 MG/ML IJ SOLN
INTRAMUSCULAR | Status: AC
  Filled 2015-02-26: qty 1

## 2015-02-26 MED ORDER — FAMOTIDINE IN NACL 20-0.9 MG/50ML-% IV SOLN
INTRAVENOUS | Status: AC
  Filled 2015-02-26: qty 50

## 2015-02-26 NOTE — Patient Instructions (Signed)
[facility] Discharge Instructions for Patients Receiving Chemotherapy  Today you received the following chemotherapy agents Taxol and Carboplatin  To help prevent nausea and vomiting after your treatment, we encourage you to take your nausea medication Compazine 82m every 6 hrs as needed.   If you develop nausea and vomiting that is not controlled by your nausea medication, call the clinic.   BELOW ARE SYMPTOMS THAT SHOULD BE REPORTED IMMEDIATELY:  *FEVER GREATER THAN 100.5 F  *CHILLS WITH OR WITHOUT FEVER  NAUSEA AND VOMITING THAT IS NOT CONTROLLED WITH YOUR NAUSEA MEDICATION  *UNUSUAL SHORTNESS OF BREATH  *UNUSUAL BRUISING OR BLEEDING  TENDERNESS IN MOUTH AND THROAT WITH OR WITHOUT PRESENCE OF ULCERS  *URINARY PROBLEMS  *BOWEL PROBLEMS  UNUSUAL RASH Items with * indicate a potential emergency and should be followed up as soon as possible.  Feel free to call the clinic you have any questions or concerns. The clinic phone number is [phone].  Please show the [facility] check-in to the Emergency Department and triage nurse.  Paclitaxel injection What is this medicine? PACLITAXEL (PAK li TAX el) is a chemotherapy drug. It targets fast dividing cells, like cancer cells, and causes these cells to die. This medicine is used to treat ovarian cancer, breast cancer, and other cancers. This medicine may be used for other purposes; ask your health care provider or pharmacist if you have questions. What should I tell my health care provider before I take this medicine? They need to know if you have any of these conditions: -blood disorders -irregular heartbeat -infection (especially a virus infection such as chickenpox, cold sores, or herpes) -liver disease -previous or ongoing radiation therapy -an unusual or allergic reaction to paclitaxel, alcohol, polyoxyethylated castor oil, other chemotherapy agents, other medicines, foods, dyes, or  preservatives -pregnant or trying to get pregnant -breast-feeding How should I use this medicine? This drug is given as an infusion into a vein. It is administered in a hospital or clinic by a specially trained health care professional. Talk to your pediatrician regarding the use of this medicine in children. Special care may be needed. Overdosage: If you think you have taken too much of this medicine contact a poison control center or emergency room at once. NOTE: This medicine is only for you. Do not share this medicine with others. What if I miss a dose? It is important not to miss your dose. Call your doctor or health care professional if you are unable to keep an appointment. What may interact with this medicine? Do not take this medicine with any of the following medications: -disulfiram -metronidazole This medicine may also interact with the following medications: -cyclosporine -diazepam -ketoconazole -medicines to increase blood counts like filgrastim, pegfilgrastim, sargramostim -other chemotherapy drugs like cisplatin, doxorubicin, epirubicin, etoposide, teniposide, vincristine -quinidine -testosterone -vaccines -verapamil Talk to your doctor or health care professional before taking any of these medicines: -acetaminophen -aspirin -ibuprofen -ketoprofen -naproxen This list may not describe all possible interactions. Give your health care provider a list of all the medicines, herbs, non-prescription drugs, or dietary supplements you use. Also tell them if you smoke, drink alcohol, or use illegal drugs. Some items may interact with your medicine. What should I watch for while using this medicine? Your condition will be monitored carefully while you are receiving this medicine. You will need important blood work done while you are taking this medicine. This drug may make you feel generally unwell. This is not uncommon, as chemotherapy can  affect healthy cells as well as cancer  cells. Report any side effects. Continue your course of treatment even though you feel ill unless your doctor tells you to stop. This medicine can cause serious allergic reactions. To reduce your risk you will need to take other medicine(s) before treatment with this medicine. In some cases, you may be given additional medicines to help with side effects. Follow all directions for their use. Call your doctor or health care professional for advice if you get a fever, chills or sore throat, or other symptoms of a cold or flu. Do not treat yourself. This drug decreases your body's ability to fight infections. Try to avoid being around people who are sick. This medicine may increase your risk to bruise or bleed. Call your doctor or health care professional if you notice any unusual bleeding. Be careful brushing and flossing your teeth or using a toothpick because you may get an infection or bleed more easily. If you have any dental work done, tell your dentist you are receiving this medicine. Avoid taking products that contain aspirin, acetaminophen, ibuprofen, naproxen, or ketoprofen unless instructed by your doctor. These medicines may hide a fever. Do not become pregnant while taking this medicine. Women should inform their doctor if they wish to become pregnant or think they might be pregnant. There is a potential for serious side effects to an unborn child. Talk to your health care professional or pharmacist for more information. Do not breast-feed an infant while taking this medicine. Men are advised not to father a child while receiving this medicine. This product may contain alcohol. Ask your pharmacist or healthcare provider if this medicine contains alcohol. Be sure to tell all healthcare providers you are taking this medicine. Certain medicines, like metronidazole and disulfiram, can cause an unpleasant reaction when taken with alcohol. The reaction includes flushing, headache, nausea, vomiting,  sweating, and increased thirst. The reaction can last from 30 minutes to several hours. What side effects may I notice from receiving this medicine? Side effects that you should report to your doctor or health care professional as soon as possible: -allergic reactions like skin rash, itching or hives, swelling of the face, lips, or tongue -low blood counts - This drug may decrease the number of white blood cells, red blood cells and platelets. You may be at increased risk for infections and bleeding. -signs of infection - fever or chills, cough, sore throat, pain or difficulty passing urine -signs of decreased platelets or bleeding - bruising, pinpoint red spots on the skin, black, tarry stools, nosebleeds -signs of decreased red blood cells - unusually weak or tired, fainting spells, lightheadedness -breathing problems -chest pain -high or low blood pressure -mouth sores -nausea and vomiting -pain, swelling, redness or irritation at the injection site -pain, tingling, numbness in the hands or feet -slow or irregular heartbeat -swelling of the ankle, feet, hands Side effects that usually do not require medical attention (report to your doctor or health care professional if they continue or are bothersome): -bone pain -complete hair loss including hair on your head, underarms, pubic hair, eyebrows, and eyelashes -changes in the color of fingernails -diarrhea -loosening of the fingernails -loss of appetite -muscle or joint pain -red flush to skin -sweating This list may not describe all possible side effects. Call your doctor for medical advice about side effects. You may report side effects to FDA at 1-800-FDA-1088. Where should I keep my medicine? This drug is given in a hospital or clinic and  will not be stored at home. NOTE: This sheet is a summary. It may not cover all possible information. If you have questions about this medicine, talk to your doctor, pharmacist, or health care  provider.    2016, [employer]. (2014-12-18 13:02:56)    Carboplatin injection What is this medicine? CARBOPLATIN (KAR boe pla tin) is a chemotherapy drug. It targets fast dividing cells, like cancer cells, and causes these cells to die. This medicine is used to treat ovarian cancer and many other cancers. This medicine may be used for other purposes; ask your health care provider or pharmacist if you have questions. What should I tell my health care provider before I take this medicine? They need to know if you have any of these conditions: -blood disorders -hearing problems -kidney disease -recent or ongoing radiation therapy -an unusual or allergic reaction to carboplatin, cisplatin, other chemotherapy, other medicines, foods, dyes, or preservatives -pregnant or trying to get pregnant -breast-feeding How should I use this medicine? This drug is usually given as an infusion into a vein. It is administered in a hospital or clinic by a specially trained health care professional. Talk to your pediatrician regarding the use of this medicine in children. Special care may be needed. Overdosage: If you think you have taken too much of this medicine contact a poison control center or emergency room at once. NOTE: This medicine is only for you. Do not share this medicine with others. What if I miss a dose? It is important not to miss a dose. Call your doctor or health care professional if you are unable to keep an appointment. What may interact with this medicine? -medicines for seizures -medicines to increase blood counts like filgrastim, pegfilgrastim, sargramostim -some antibiotics like amikacin, gentamicin, neomycin, streptomycin, tobramycin -vaccines Talk to your doctor or health care professional before taking any of these medicines: -acetaminophen -aspirin -ibuprofen -ketoprofen -naproxen This list may not describe all possible interactions. Give your health care  provider a list of all the medicines, herbs, non-prescription drugs, or dietary supplements you use. Also tell them if you smoke, drink alcohol, or use illegal drugs. Some items may interact with your medicine. What should I watch for while using this medicine? Your condition will be monitored carefully while you are receiving this medicine. You will need important blood work done while you are taking this medicine. This drug may make you feel generally unwell. This is not uncommon, as chemotherapy can affect healthy cells as well as cancer cells. Report any side effects. Continue your course of treatment even though you feel ill unless your doctor tells you to stop. In some cases, you may be given additional medicines to help with side effects. Follow all directions for their use. Call your doctor or health care professional for advice if you get a fever, chills or sore throat, or other symptoms of a cold or flu. Do not treat yourself. This drug decreases your body's ability to fight infections. Try to avoid being around people who are sick. This medicine may increase your risk to bruise or bleed. Call your doctor or health care professional if you notice any unusual bleeding. Be careful brushing and flossing your teeth or using a toothpick because you may get an infection or bleed more easily. If you have any dental work done, tell your dentist you are receiving this medicine. Avoid taking products that contain aspirin, acetaminophen, ibuprofen, naproxen, or ketoprofen unless instructed by your doctor. These medicines may hide a fever. Do  not become pregnant while taking this medicine. Women should inform their doctor if they wish to become pregnant or think they might be pregnant. There is a potential for serious side effects to an unborn child. Talk to your health care professional or pharmacist for more information. Do not breast-feed an infant while taking this medicine. What side effects may I  notice from receiving this medicine? Side effects that you should report to your doctor or health care professional as soon as possible: -allergic reactions like skin rash, itching or hives, swelling of the face, lips, or tongue -signs of infection - fever or chills, cough, sore throat, pain or difficulty passing urine -signs of decreased platelets or bleeding - bruising, pinpoint red spots on the skin, black, tarry stools, nosebleeds -signs of decreased red blood cells - unusually weak or tired, fainting spells, lightheadedness -breathing problems -changes in hearing -changes in vision -chest pain -high blood pressure -low blood counts - This drug may decrease the number of white blood cells, red blood cells and platelets. You may be at increased risk for infections and bleeding. -nausea and vomiting -pain, swelling, redness or irritation at the injection site -pain, tingling, numbness in the hands or feet -problems with balance, talking, walking -trouble passing urine or change in the amount of urine Side effects that usually do not require medical attention (report to your doctor or health care professional if they continue or are bothersome): -hair loss -loss of appetite -metallic taste in the mouth or changes in taste This list may not describe all possible side effects. Call your doctor for medical advice about side effects. You may report side effects to FDA at 1-800-FDA-1088. Where should I keep my medicine? This drug is given in a hospital or clinic and will not be stored at home. NOTE: This sheet is a summary. It may not cover all possible information. If you have questions about this medicine, talk to your doctor, pharmacist, or health care provider.    2016, [employer]. (2007-08-07 14:38:05)

## 2015-02-26 NOTE — Progress Notes (Signed)
[place]  [phone]) [phone] Fax:(336) [phone]     ID: Susan Wilson DOB: [birth year]  MR#: [id number]  PYK#:[id number]  Patient Care Team: Donnamae Jude, MD as PCP - General (Obstetrics and Gynecology) Rolm Bookbinder, MD as Consulting Physician (General Surgery) Chauncey Cruel, MD as Consulting Physician (Oncology) Thea Silversmith, MD as Consulting Physician (Radiation Oncology) [name], RN as Registered Nurse Rockwell Germany, RN as Registered Nurse Holley Bouche, NP as Nurse Practitioner (Nurse Practitioner) PCP: Donnamae Jude, MD OTHER MD:  CHIEF COMPLAINT: Estrogen receptor negative breast cancer  CURRENT TREATMENT: Neoadjuvant chemotherapy  BREAST CANCER HISTORY: From the original intake note:  Susan Wilson herself noted a mass in her right breast sometime around April. Initially she thought it might be related to menstruation, but as it did not change and eventually became tender, she brought it to her physician's attention. On 11/20/2014 patient underwent bilateral diagnostic mammography with tomosynthesis and right breast ultrasonography at the breast Center. The breast density was category B. There was a hyperdense mass in the right lower inner quadrant associated with skin thickening. There was also a 5 mm nodule posteriorly at the 8:30 o'clock position in the right breast. There were several hyperdense nodules in the right axilla. On physical exam there was a firm fixed mass in the right breast at the 5:00 position. By ultrasound this was lobulated and appear to involve the skin. It measures up to 4.1 cm. There was no sonographic correlation to the 5 mm nodule seen in a different area of the right breast. The right axilla showed 3 hypervascular lymph nodes with prominent cortical thickening, measuring less than 1.5 cm.  On 11/21/2014 the patient underwent right breast biopsy (5:00 mass) and biopsy of one of the suspicious right  axillary lymph nodes. 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  The patient's subsequent history is as detailed below  INTERVAL HISTORY: Susan Wilson returns today for follow up of her breast cancer, accompanied by her husband. Today she is to start cycle 1 of 12 weekly planned doses of carboplatin and paclitaxel.  REVIEW OF SYSTEMS: Overall Susan Wilson is feeling well today. She denies fevers or chills. Her nausea is up and down, but can be heightened depending on the smells she is around. Her appetite is "ok," but she is not skipping meals. She is moving her bowels well. She denies mouth sores, rashes, or neuropathy symptoms. Her energy level is fair. She denies shortness of breath, chest pain, cough, or palpitations. A detailed review of systems is otherwise stable.   PAST MEDICAL HISTORY: Past Medical History  Diagnosis Date  . 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 Breast cancer   . Hernia, umbilical   . Back pain   . Headache   . Anemia   . Hernia, umbilical   . Family history of breast cancer     PAST SURGICAL HISTORY: Past Surgical History  Procedure Laterality Date  . Portacath placement Right 12/16/2014    Procedure: INSERTION PORT-A-CATH WITH ULTRASOUND;  Surgeon: Rolm Bookbinder, MD;  Location: [facility];  Service: General;  Laterality: Right;    FAMILY HISTORY Family History  Problem Relation Age of Onset  . Mesothelioma Maternal Grandfather     asbestos exposure,  died in his 70s  . Heart defect Sister 0  . Heart disease Paternal Aunt   . Heart disease Paternal Uncle   . Breast cancer Paternal Grandmother   . Diabetes Paternal Grandmother   . Cancer Paternal Grandfather     NOS  . Colon cancer Other 76     MGMs brother with colon cancer  . Cancer Other     several of MGF's sisters with cancer NOS  . Liver cancer Other     MGF's brother   the patient's parents are living, her father being 54 and her mother 36 as of July 2016. The patient had no brothers. One sister died at age 92 from cardiac problems. The other sister is in good health. On the maternal side there is a history of colon cancer and an uncle age 23, liver cancer in a great uncle and mesothelioma in the maternal grandfather.  GYNECOLOGIC HISTORY:  No LMP recorded. Menarche age 63. The patient is GX P2. 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 The daughters are [place] and Lovie Macadamia, age 19 and 54. The patient attends a local [place]: Not in place   HEALTH MAINTENANCE: Social History  Substance Use Topics  . Smoking status: Never Smoker   . Smokeless tobacco: Never Used  . Alcohol Use: Yes     Comment: rarely     Colonoscopy:  PAP: 2014  Bone density:  Lipid panel:  No Known Allergies  Current Outpatient Prescriptions  Medication Sig Dispense Refill  . ibuprofen (ADVIL,MOTRIN) 200 MG tablet Take 400 mg by mouth every 6 (six) hours as needed (for pain).     Marland Kitchen HYDROcodone-acetaminophen (NORCO/VICODIN) 5-325 MG tablet Take 1 tablet by mouth every 6 (six) hours as needed for moderate pain. (Patient not taking: Reported on 02/26/2015) 20 tablet 0   No current facility-administered medications for this visit.    OBJECTIVE: Young African-American woman in no acute distress Filed Vitals:   02/26/15 1300  BP: 107/69  Pulse: 81  Temp: 98.4 F (36.9 C)  Resp: 18     Body mass index is 36.13 kg/(m^2).    ECOG FS:1 - Symptomatic but completely ambulatory  Skin: warm, dry  HEENT: sclerae anicteric, conjunctivae pink, oropharynx clear. No thrush or mucositis.  Lymph Nodes:  No cervical or supraclavicular lymphadenopathy  Lungs: clear to auscultation bilaterally, no rales, wheezes, or rhonci  Heart: regular rate and rhythm  Abdomen: round, soft, non tender, positive bowel sounds  Musculoskeletal: No focal spinal tenderness, no peripheral edema  Neuro: non focal, well oriented, positive affect  Breasts: deferred  LAB RESULTS:  CMP     Component Value Date/Time   NA 142 02/26/2015 1249   NA 136 12/12/2014 1222   K 4.0 02/26/2015 1249   K 4.2 12/12/2014 1222   CL 102 12/12/2014 1222   CO2 26 02/26/2015 1249   CO2 25 12/12/2014 1222   GLUCOSE 91 02/26/2015 1249   GLUCOSE 98 12/12/2014 1222   BUN 13.0 02/26/2015 1249   BUN 14 12/12/2014 1222   CREATININE 0.7 02/26/2015 1249   CREATININE 0.69 12/12/2014 1222   CALCIUM 9.1 02/26/2015 1249   CALCIUM 9.3 12/12/2014 1222   PROT 6.8 02/26/2015 1249   ALBUMIN 4.0 02/26/2015 1249   AST 10 02/26/2015 1249   ALT 15 02/26/2015 1249   ALKPHOS 90 02/26/2015 1249  BILITOT <0.30 02/26/2015 1249   GFRNONAA >60 12/12/2014 1222   GFRAA >60 12/12/2014 1222    INo results found for: SPEP, UPEP  Lab Results  Component Value Date   WBC 11.2* 02/26/2015   NEUTROABS 9.1* 02/26/2015   HGB 9.7* 02/26/2015   HCT 30.7* 02/26/2015   MCV 78.2* 02/26/2015   PLT 180 02/26/2015      Chemistry      Component Value Date/Time   NA 142 02/26/2015 1249   NA 136 12/12/2014 1222   K 4.0 02/26/2015 1249   K 4.2 12/12/2014 1222   CL 102 12/12/2014 1222   CO2 26 02/26/2015 1249   CO2 25 12/12/2014 1222   BUN 13.0 02/26/2015 1249   BUN 14 12/12/2014 1222   CREATININE 0.7 02/26/2015 1249   CREATININE 0.69 12/12/2014 1222      Component Value Date/Time   CALCIUM 9.1 02/26/2015 1249   CALCIUM 9.3 12/12/2014 1222   ALKPHOS 90 02/26/2015 1249   AST 10 02/26/2015 1249   ALT 15 02/26/2015 1249   BILITOT <0.30 02/26/2015 1249       No results found for: LABCA2  No components found for: LABCA125  No results for  input(s): INR in the last 168 hours.  Urinalysis    Component Value Date/Time   LABSPEC 1.020 05/06/2009 1439   PHURINE 7.0 05/06/2009 1439   GLUCOSEU NEGATIVE 05/06/2009 1439   HGBUR NEGATIVE 05/06/2009 1439   BILIRUBINUR NEGATIVE 05/06/2009 1439   KETONESUR NEGATIVE 05/06/2009 1439   PROTEINUR NEGATIVE 05/06/2009 1439   UROBILINOGEN 0.2 05/06/2009 1439   NITRITE NEGATIVE 05/06/2009 1439   LEUKOCYTESUR  05/06/2009 1439    NEGATIVE Biochemical Testing Only. Please order routine urinalysis from main lab if confirmatory testing is needed.    STUDIES: Mr Breast Bilateral W Wo Contrast  02/17/2015  CLINICAL DATA:  Patient with recent diagnosis of right breast cancer, undergoing neoadjuvant chemotherapy. LABS:  None. EXAM: BILATERAL BREAST MRI WITH AND WITHOUT CONTRAST TECHNIQUE: Multiplanar, multisequence MR images of both breasts were obtained prior to and following the intravenous administration of 20 ml of MultiHance. THREE-DIMENSIONAL MR IMAGE RENDERING ON INDEPENDENT WORKSTATION: Three-dimensional MR images were rendered by post-processing of the original MR data on an independent workstation. The three-dimensional MR images were interpreted, and findings are reported in the following complete MRI report for this study. Three dimensional images were evaluated at the independent Dyn[employer] workstation COMPARISON:  MRI of the breast dated 12/05/2014, mammogram and ultrasound dated 11/21/2014 and Previous mammograms. FINDINGS: Breast composition: b. Scattered fibroglandular tissue. Background parenchymal enhancement: Moderate. Right breast: The right breast 5 o'clock tumor has significantly decreased in size, and is less avid demonstrating predominantly progressive kinetics of enhancement. It now measures 3.9 by 2.9 by 1.6 cm. Continued extension to the overlying skin is noted. However the pectoralis muscle appears involved. Left breast: No mass or abnormal enhancement. Signal void artifact is seen  from tissue marker in the left breast lower outer quadrant, anterior depth, from prior benign core needle biopsy. Lymph nodes: No abnormal appearing lymph nodes. Ancillary findings:  None. IMPRESSION: Interval decrease in the size and intensity of enhancement of the known right breast 5 o'clock malignancy, consistent with good response to neoadjuvant chemotherapy. RECOMMENDATION: Continue with the plan of care. BI-RADS CATEGORY  6: Known biopsy-proven malignancy. Electronically Signed   By: Fidela Salisbury M.D.   On: 02/17/2015 15:46   Mr Attempted Daymon Larsen Report  02/16/2015  This examination belongs to an outside facility and is  stored here for comparison purposes only.  Contact the originating outside institution for any associated report or interpretation.   ASSESSMENT: 39 y.o. [place] woman s/p Right breast biopsy 11/21/2014 for a clinical T2 NX, stage 2 invasive ductal carcinoma, grade 2 or 3, estrogen and progesterone receptor negative, HER-2 equivocal, with an Mib-1 of 20%  (a) biopsy of a suspicious axillary lymph node same day was negative, but discordant  (b) review of 80 additional tumor cells by FISH still showed HER-2 not amplified; tumor should be treated as a triple negative  (1) neoadjuvant chemotherapy started 01/01/2015 consisting of cyclophosphamide and doxorubicin in dose dense fashion 4, with onpro support, completed 02/13/2015, to be followed by paclitaxel/ carboplatin weekly 12   (2) genetics testing 12/15/2014 through the Breast/Ovarian gene panel offered by [facility] found no deleterious mutations in ATM, BARD1, BRCA1, BRCA2, BRIP1, CDH1, CHEK2, EPCAM, FANCC, MLH1, MSH2, MSH6, NBN, PALB2, PMS2, PTEN, RAD51C, RAD51D, TP53, and XRCC2  (3) definitive surgery  to follow chemotherapy  (4) radiation to follow surgery  PLAN: Susan Wilson looks and feels well today. We reviewed her labs and her ferritin level is normal, with an MCV of 78.2 and hgb of 9.7. She likely has a  thalassemia that has only been discovered since starting chemotherapy. In any case she is asymptomatic despite the counts. She will proceed with cycle 1 of carboplatin and paclitaxel. We briefly reviewed the side effects and toxicities of each drug. She will eliminate her use of dexamethasone and will rely on compazine and zofran alone for her nausea.   Susan Wilson will return in 1 week for labs and cycle 2 of treatment. She understands and agrees with this plan. She knows the goal of treatment in her case is cure. She has been encouraged to call with any issues that might arise before her next visit here.   Susan Panda, NP   02/26/2015 1:45 PM

## 2015-02-26 NOTE — Progress Notes (Signed)
Pt reports a mild rash developing around her left chin and cheek area. No complaints of itching and pain at this time. Pt reports noticed this after Taxol treatment. No further progression of rash/ swelling noted.

## 2015-02-27 ENCOUNTER — Telehealth: Payer: Self-pay | Admitting: Oncology

## 2015-02-27 ENCOUNTER — Encounter: Payer: Self-pay | Admitting: *Deleted

## 2015-02-27 ENCOUNTER — Ambulatory Visit: Payer: 59 | Admitting: Nurse Practitioner

## 2015-02-27 ENCOUNTER — Ambulatory Visit: Payer: 59

## 2015-02-27 ENCOUNTER — Other Ambulatory Visit: Payer: 59

## 2015-02-27 NOTE — Telephone Encounter (Signed)
All appointments moved per pof and patient will get a new schedule on 10/19

## 2015-03-02 ENCOUNTER — Encounter: Payer: Self-pay | Admitting: Oncology

## 2015-03-02 NOTE — Progress Notes (Signed)
I placed fmla forms on desk of nurse for dr. Jana Hakim

## 2015-03-04 ENCOUNTER — Ambulatory Visit: Payer: 59 | Admitting: Nurse Practitioner

## 2015-03-04 ENCOUNTER — Ambulatory Visit (HOSPITAL_BASED_OUTPATIENT_CLINIC_OR_DEPARTMENT_OTHER): Payer: 59 | Admitting: Nurse Practitioner

## 2015-03-04 ENCOUNTER — Other Ambulatory Visit: Payer: Self-pay | Admitting: Oncology

## 2015-03-04 ENCOUNTER — Other Ambulatory Visit (HOSPITAL_BASED_OUTPATIENT_CLINIC_OR_DEPARTMENT_OTHER): Payer: 59

## 2015-03-04 ENCOUNTER — Other Ambulatory Visit: Payer: 59

## 2015-03-04 ENCOUNTER — Encounter: Payer: Self-pay | Admitting: Oncology

## 2015-03-04 ENCOUNTER — Ambulatory Visit (HOSPITAL_BASED_OUTPATIENT_CLINIC_OR_DEPARTMENT_OTHER): Payer: 59

## 2015-03-04 ENCOUNTER — Encounter: Payer: Self-pay | Admitting: Nurse Practitioner

## 2015-03-04 VITALS — BP 115/63 | HR 77 | Temp 98.7°F | Resp 19 | Ht 65.0 in | Wt 216.6 lb

## 2015-03-04 DIAGNOSIS — Z171 Estrogen receptor negative status [ER-]: Secondary | ICD-10-CM | POA: Diagnosis not present

## 2015-03-04 DIAGNOSIS — C50311 Malignant neoplasm of lower-inner quadrant of right female breast: Secondary | ICD-10-CM

## 2015-03-04 DIAGNOSIS — Z5111 Encounter for antineoplastic chemotherapy: Secondary | ICD-10-CM | POA: Diagnosis not present

## 2015-03-04 LAB — CBC WITH DIFFERENTIAL/PLATELET
BASO%: 2.1 % — AB (ref 0.0–2.0)
Basophils Absolute: 0.1 10*3/uL (ref 0.0–0.1)
EOS%: 0.4 % (ref 0.0–7.0)
Eosinophils Absolute: 0 10*3/uL (ref 0.0–0.5)
HEMATOCRIT: 30.4 % — AB (ref 34.8–46.6)
HGB: 9.5 g/dL — ABNORMAL LOW (ref 11.6–15.9)
LYMPH#: 0.8 10*3/uL — AB (ref 0.9–3.3)
LYMPH%: 27.2 % (ref 14.0–49.7)
MCH: 24.7 pg — ABNORMAL LOW (ref 25.1–34.0)
MCHC: 31.3 g/dL — ABNORMAL LOW (ref 31.5–36.0)
MCV: 78.9 fL — ABNORMAL LOW (ref 79.5–101.0)
MONO#: 0.3 10*3/uL (ref 0.1–0.9)
MONO%: 9.9 % (ref 0.0–14.0)
NEUT%: 60.4 % (ref 38.4–76.8)
NEUTROS ABS: 1.9 10*3/uL (ref 1.5–6.5)
PLATELETS: 260 10*3/uL (ref 145–400)
RBC: 3.85 10*6/uL (ref 3.70–5.45)
RDW: 17.9 % — ABNORMAL HIGH (ref 11.2–14.5)
WBC: 3.1 10*3/uL — AB (ref 3.9–10.3)

## 2015-03-04 LAB — COMPREHENSIVE METABOLIC PANEL (CC13)
ALBUMIN: 3.8 g/dL (ref 3.5–5.0)
ALT: 21 U/L (ref 0–55)
ANION GAP: 5 meq/L (ref 3–11)
AST: 14 U/L (ref 5–34)
Alkaline Phosphatase: 74 U/L (ref 40–150)
BILIRUBIN TOTAL: 0.37 mg/dL (ref 0.20–1.20)
BUN: 10.5 mg/dL (ref 7.0–26.0)
CALCIUM: 9.2 mg/dL (ref 8.4–10.4)
CO2: 27 mEq/L (ref 22–29)
CREATININE: 0.7 mg/dL (ref 0.6–1.1)
Chloride: 108 mEq/L (ref 98–109)
EGFR: >90 mL/min/{1.73_m2} (ref >90)
Glucose: 113 mg/dl (ref 70–140)
Potassium: 4.1 mEq/L (ref 3.5–5.1)
Sodium: 140 mEq/L (ref 136–145)
TOTAL PROTEIN: 6.7 g/dL (ref 6.4–8.3)

## 2015-03-04 MED ORDER — SODIUM CHLORIDE 0.9 % IJ SOLN
10.0000 mL | INTRAMUSCULAR | Status: DC | PRN
  Administered 2015-03-04: 10 mL
  Filled 2015-03-04: qty 10

## 2015-03-04 MED ORDER — DIPHENHYDRAMINE HCL 50 MG/ML IJ SOLN
50.0000 mg | Freq: Once | INTRAMUSCULAR | Status: AC
  Administered 2015-03-04: 50 mg via INTRAVENOUS

## 2015-03-04 MED ORDER — FAMOTIDINE IN NACL 20-0.9 MG/50ML-% IV SOLN
INTRAVENOUS | Status: AC
  Filled 2015-03-04: qty 50

## 2015-03-04 MED ORDER — FAMOTIDINE IN NACL 20-0.9 MG/50ML-% IV SOLN
20.0000 mg | Freq: Once | INTRAVENOUS | Status: AC
  Administered 2015-03-04: 20 mg via INTRAVENOUS

## 2015-03-04 MED ORDER — HEPARIN SOD (PORK) LOCK FLUSH 100 UNIT/ML IV SOLN
500.0000 [IU] | Freq: Once | INTRAVENOUS | Status: AC | PRN
  Administered 2015-03-04: 500 [IU]
  Filled 2015-03-04: qty 5

## 2015-03-04 MED ORDER — SODIUM CHLORIDE 0.9 % IV SOLN
300.0000 mg | Freq: Once | INTRAVENOUS | Status: AC
  Administered 2015-03-04: 300 mg via INTRAVENOUS
  Filled 2015-03-04: qty 30

## 2015-03-04 MED ORDER — SODIUM CHLORIDE 0.9 % IV SOLN
Freq: Once | INTRAVENOUS | Status: AC
  Administered 2015-03-04: 15:00:00 via INTRAVENOUS
  Filled 2015-03-04: qty 8

## 2015-03-04 MED ORDER — PACLITAXEL CHEMO INJECTION 300 MG/50ML
80.0000 mg/m2 | Freq: Once | INTRAVENOUS | Status: AC
  Administered 2015-03-04: 174 mg via INTRAVENOUS
  Filled 2015-03-04: qty 29

## 2015-03-04 MED ORDER — ACETAMINOPHEN 325 MG PO TABS
ORAL_TABLET | ORAL | Status: AC
  Filled 2015-03-04: qty 2

## 2015-03-04 MED ORDER — DIPHENHYDRAMINE HCL 50 MG/ML IJ SOLN
INTRAMUSCULAR | Status: AC
  Filled 2015-03-04: qty 1

## 2015-03-04 MED ORDER — SODIUM CHLORIDE 0.9 % IV SOLN
Freq: Once | INTRAVENOUS | Status: AC
  Administered 2015-03-04: 15:00:00 via INTRAVENOUS

## 2015-03-04 NOTE — Progress Notes (Signed)
[place]  [phone] [phone]) [phone]     ID: Gennett Garcia Vanderloop-Wilson DOB: [birth year]  MR#: [id number]  LNZ#:[id number]  Patient Care Team: Donnamae Jude, MD as PCP - General (Obstetrics and Gynecology) Rolm Bookbinder, MD as Consulting Physician (General Surgery) Chauncey Cruel, MD as Consulting Physician (Oncology) Thea Silversmith, MD as Consulting Physician (Radiation Oncology) [name], RN as Registered Nurse Rockwell Germany, RN as Registered Nurse Holley Bouche, NP as Nurse Practitioner (Nurse Practitioner) PCP: Donnamae Jude, MD OTHER MD:  CHIEF COMPLAINT: Estrogen receptor negative breast cancer  CURRENT TREATMENT: Neoadjuvant chemotherapy  BREAST CANCER HISTORY: From the original intake note:  Sarafina herself noted a mass in her right breast sometime around April. Initially she thought it might be related to menstruation, but as it did not change and eventually became tender, she brought it to her physician's attention. On 11/20/2014 patient underwent bilateral diagnostic mammography with tomosynthesis and right breast ultrasonography at the breast Center. The breast density was category B. There was a hyperdense mass in the right lower inner quadrant associated with skin thickening. There was also a 5 mm nodule posteriorly at the 8:30 o'clock position in the right breast. There were several hyperdense nodules in the right axilla. On physical exam there was a firm fixed mass in the right breast at the 5:00 position. By ultrasound this was lobulated and appear to involve the skin. It measures up to 4.1 cm. There was no sonographic correlation to the 5 mm nodule seen in a different area of the right breast. The right axilla showed 3 hypervascular lymph nodes with prominent cortical thickening, measuring less than 1.5 cm.  On 11/21/2014 the patient underwent right breast biopsy (5:00 mass) and biopsy of one of the suspicious right  axillary lymph nodes. 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  The patient's subsequent history is as detailed below  INTERVAL HISTORY: Milagro returns today for follow up of her breast cancer, accompanied by her husband. Today she is due for cycle 2 of 12 weekly planned doses of carboplatin and paclitaxel.  REVIEW OF SYSTEMS: Alesana denies any glaring complaints with her first cycle of treatment. She was given extra benadryl during the infusion because of a mild rash on her chin, but afterwards did not notice this. She denies fevers, chills, or changes in bowel or bladder habits. She has mild queeziness. Her appetite is fair secondary to taste changes. She denies mouth sores. She has no neuropathy symptoms. Her energy level is decent. A detailed review of systems is otherwise stable.  PAST MEDICAL HISTORY: Past Medical History  Diagnosis Date  . Breast cancer of lower-inner quadrant of right female breast ([place]) 11/25/2014  . Breast cancer ([place])   . Hernia, umbilical   . Back pain   . Headache   . Anemia   . Hernia, umbilical   . Family history of breast cancer     PAST SURGICAL HISTORY: Past Surgical History  Procedure Laterality Date  . Portacath placement Right 12/16/2014    Procedure: INSERTION PORT-A-CATH WITH ULTRASOUND;  Surgeon: Rolm Bookbinder, MD;  Location: [facility];  Service: General;  Laterality: Right;    FAMILY HISTORY Family History  Problem Relation Age of Onset  . Mesothelioma Maternal Grandfather  asbestos exposure, died in his 77s  . Heart defect Sister 0  . Heart disease Paternal Aunt   . Heart disease Paternal Uncle   . Breast cancer Paternal Grandmother   . Diabetes Paternal Grandmother   . Cancer Paternal Grandfather       NOS  . Colon cancer Other 79    MGMs brother with colon cancer  . Cancer Other     several of MGF's sisters with cancer NOS  . Liver cancer Other     MGF's brother   the patient's parents are living, her father being 14 and her mother 34 as of July 2016. The patient had no brothers. One sister died at age 67 from cardiac problems. The other sister is in good health. On the maternal side there is a history of colon cancer and an uncle age 41, liver cancer in a great uncle and mesothelioma in the maternal grandfather.  GYNECOLOGIC HISTORY:  No LMP recorded. Menarche age 25. The patient is GX P2. She still having regular periods  SOCIAL HISTORY:  Talaysha works in Therapist, art for [employer]. Her husband Antrell works for [employer]. The daughters are [place] and Lovie Macadamia, age 36 and 71. The patient attends a local [employer]: Not in place   HEALTH MAINTENANCE: Social History  Substance Use Topics  . Smoking status: Never Smoker   . Smokeless tobacco: Never Used  . Alcohol Use: Yes     Comment: rarely     Colonoscopy:  PAP: 2014  Bone density:  Lipid panel:  No Known Allergies  Current Outpatient Prescriptions  Medication Sig Dispense Refill  . ibuprofen (ADVIL,MOTRIN) 200 MG tablet Take 400 mg by mouth every 6 (six) hours as needed (for pain).     Marland Kitchen lidocaine-prilocaine (EMLA) cream APPLY TO AFFTECTED AREA ONCE AS DIRECTED  0  . LORazepam (ATIVAN) 0.5 MG tablet Take 0.5 mg by mouth at bedtime.  0  . prochlorperazine (COMPAZINE) 10 MG tablet Take 10 mg by mouth every 6 (six) hours as needed for nausea or vomiting.   0  . dexamethasone (DECADRON) 4 MG tablet   0  . HYDROcodone-acetaminophen (NORCO/VICODIN) 5-325 MG tablet Take 1 tablet by mouth every 6 (six) hours as needed for moderate pain. (Patient not taking: Reported on 02/26/2015) 20 tablet 0  . ondansetron (ZOFRAN) 8 MG tablet TAKE 1 TABLET BY MOUTH TWICE DAILY AS  NEEDED. START ON 3RD DAY AFTER CHEMOTHERAPY  0   No current facility-administered medications for this visit.    OBJECTIVE: Young African-American woman in no acute distress Filed Vitals:   03/04/15 1338  BP: 115/63  Pulse: 77  Temp: 98.7 F (37.1 C)  Resp: 19     Body mass index is 36.04 kg/(m^2).    ECOG FS:1 - Symptomatic but completely ambulatory  Sclerae unicteric, pupils round and equal Oropharynx clear and moist-- no thrush or other lesions No cervical or supraclavicular adenopathy Lungs no rales or rhonchi Heart regular rate and rhythm Abd soft, nontender, positive bowel sounds MSK no focal spinal tenderness, no upper extremity lymphedema Neuro: nonfocal, well oriented, appropriate affect Breasts: deferred  LAB RESULTS:  CMP     Component Value Date/Time   NA 140 03/04/2015 1327   NA 136 12/12/2014 1222   K 4.1 03/04/2015 1327   K 4.2 12/12/2014 1222   CL 102 12/12/2014 1222   CO2 27 03/04/2015 1327   CO2 25 12/12/2014 1222   GLUCOSE 113  03/04/2015 1327   GLUCOSE 98 12/12/2014 1222   BUN 10.5 03/04/2015 1327   BUN 14 12/12/2014 1222   CREATININE 0.7 03/04/2015 1327   CREATININE 0.69 12/12/2014 1222   CALCIUM 9.2 03/04/2015 1327   CALCIUM 9.3 12/12/2014 1222   PROT 6.7 03/04/2015 1327   ALBUMIN 3.8 03/04/2015 1327   AST 14 03/04/2015 1327   ALT 21 03/04/2015 1327   ALKPHOS 74 03/04/2015 1327   BILITOT 0.37 03/04/2015 1327   GFRNONAA >60 12/12/2014 1222   GFRAA >60 12/12/2014 1222    INo results found for: SPEP, UPEP  Lab Results  Component Value Date   WBC 3.1* 03/04/2015   NEUTROABS 1.9 03/04/2015   HGB 9.5* 03/04/2015   HCT 30.4* 03/04/2015   MCV 78.9* 03/04/2015   PLT 260 03/04/2015      Chemistry      Component Value Date/Time   NA 140 03/04/2015 1327   NA 136 12/12/2014 1222   K 4.1 03/04/2015 1327   K 4.2 12/12/2014 1222   CL 102 12/12/2014 1222   CO2 27 03/04/2015 1327   CO2 25 12/12/2014 1222   BUN 10.5 03/04/2015 1327   BUN  14 12/12/2014 1222   CREATININE 0.7 03/04/2015 1327   CREATININE 0.69 12/12/2014 1222      Component Value Date/Time   CALCIUM 9.2 03/04/2015 1327   CALCIUM 9.3 12/12/2014 1222   ALKPHOS 74 03/04/2015 1327   AST 14 03/04/2015 1327   ALT 21 03/04/2015 1327   BILITOT 0.37 03/04/2015 1327       No results found for: LABCA2  No components found for: LABCA125  No results for input(s): INR in the last 168 hours.  Urinalysis    Component Value Date/Time   LABSPEC 1.020 05/06/2009 1439   PHURINE 7.0 05/06/2009 1439   GLUCOSEU NEGATIVE 05/06/2009 1439   HGBUR NEGATIVE 05/06/2009 1439   BILIRUBINUR NEGATIVE 05/06/2009 1439   KETONESUR NEGATIVE 05/06/2009 1439   PROTEINUR NEGATIVE 05/06/2009 1439   UROBILINOGEN 0.2 05/06/2009 1439   NITRITE NEGATIVE 05/06/2009 1439   LEUKOCYTESUR  05/06/2009 1439    NEGATIVE Biochemical Testing Only. Please order routine urinalysis from main lab if confirmatory testing is needed.    STUDIES: Mr Breast Bilateral W Wo Contrast  02/17/2015  CLINICAL DATA:  Patient with recent diagnosis of right breast cancer, undergoing neoadjuvant chemotherapy. LABS:  None. EXAM: BILATERAL BREAST MRI WITH AND WITHOUT CONTRAST TECHNIQUE: Multiplanar, multisequence MR images of both breasts were obtained prior to and following the intravenous administration of 20 ml of MultiHance. THREE-DIMENSIONAL MR IMAGE RENDERING ON INDEPENDENT WORKSTATION: Three-dimensional MR images were rendered by post-processing of the original MR data on an independent workstation. The three-dimensional MR images were interpreted, and findings are reported in the following complete MRI report for this study. Three dimensional images were evaluated at the independent Dyna[employer]ad workstation COMPARISON:  MRI of the breast dated 12/05/2014, mammogram and ultrasound dated 11/21/2014 and Previous mammograms. FINDINGS: Breast composition: b. Scattered fibroglandular tissue. Background parenchymal enhancement:  Moderate. Right breast: The right breast 5 o'clock tumor has significantly decreased in size, and is less avid demonstrating predominantly progressive kinetics of enhancement. It now measures 3.9 by 2.9 by 1.6 cm. Continued extension to the overlying skin is noted. However the pectoralis muscle appears involved. Left breast: No mass or abnormal enhancement. Signal void artifact is seen from tissue marker in the left breast lower outer quadrant, anterior depth, from prior benign core needle biopsy. Lymph nodes: No abnormal appearing lymph  nodes. Ancillary findings:  None. IMPRESSION: Interval decrease in the size and intensity of enhancement of the known right breast 5 o'clock malignancy, consistent with good response to neoadjuvant chemotherapy. RECOMMENDATION: Continue with the plan of care. BI-RADS CATEGORY  6: Known biopsy-proven malignancy. Electronically Signed   By: Fidela Salisbury M.D.   On: 02/17/2015 15:46   Mr Attempted Daymon Larsen Report  02/16/2015  This examination belongs to an outside facility and is stored here for comparison purposes only.  Contact the originating outside institution for any associated report or interpretation.   ASSESSMENT: 39 y.o. [place] woman s/p Right breast biopsy 11/21/2014 for a clinical T2 NX, stage 2 invasive ductal carcinoma, grade 2 or 3, estrogen and progesterone receptor negative, HER-2 equivocal, with an Mib-1 of 20%  (a) biopsy of a suspicious axillary lymph node same day was negative, but discordant  (b) review of 80 additional tumor cells by FISH still showed HER-2 not amplified; tumor should be treated as a triple negative  (1) neoadjuvant chemotherapy started 01/01/2015 consisting of cyclophosphamide and doxorubicin in dose dense fashion 4, with onpro support, completed 02/13/2015, to be followed by paclitaxel/ carboplatin weekly 12   (2) genetics testing 12/15/2014 through the Breast/Ovarian gene panel offered by [facility] found no deleterious  mutations in ATM, BARD1, BRCA1, BRCA2, BRIP1, CDH1, CHEK2, EPCAM, FANCC, MLH1, MSH2, MSH6, NBN, PALB2, PMS2, PTEN, RAD51C, RAD51D, TP53, and XRCC2  (3) definitive surgery  to follow chemotherapy  (4) radiation to follow surgery  PLAN: Mariha had a pleasant experience with her first cycle. I do not foresee any adjustments needing to be made at this time.The labs were reviewed in detail and were stable. She will proceed with cycle 2 of carboplatin and paclitaxel as planned today.   Coretha will return in 1 week for labs and cycle 3 of treatment. She understands and agrees with this plan. She knows the goal of treatment in her case is cure. She has been encouraged to call with any issues that might arise before her next visit here.   Laurie Panda, NP   03/04/2015 2:13 PM

## 2015-03-04 NOTE — Progress Notes (Signed)
I faxed forms/notes/labs to [facility] [phone]. Form was faxed by nurse possibly also.

## 2015-03-04 NOTE — Patient Instructions (Signed)
[place] Discharge Instructions for Patients Receiving Chemotherapy  Today you received the following chemotherapy agents Taxol and Carboplatin  To help prevent nausea and vomiting after your treatment, we encourage you to take your nausea medication Compazine 4m every 6 hrs as needed.   If you develop nausea and vomiting that is not controlled by your nausea medication, call the clinic.   BELOW ARE SYMPTOMS THAT SHOULD BE REPORTED IMMEDIATELY:  *FEVER GREATER THAN 100.5 F  *CHILLS WITH OR WITHOUT FEVER  NAUSEA AND VOMITING THAT IS NOT CONTROLLED WITH YOUR NAUSEA MEDICATION  *UNUSUAL SHORTNESS OF BREATH  *UNUSUAL BRUISING OR BLEEDING  TENDERNESS IN MOUTH AND THROAT WITH OR WITHOUT PRESENCE OF ULCERS  *URINARY PROBLEMS  *BOWEL PROBLEMS  UNUSUAL RASH Items with * indicate a potential emergency and should be followed up as soon as possible.  Feel free to call the clinic you have any questions or concerns. The clinic phone number is [phone].  Please show the [facility] check-in to the Emergency Department and triage nurse.  Paclitaxel injection What is this medicine? PACLITAXEL (PAK li TAX el) is a chemotherapy drug. It targets fast dividing cells, like cancer cells, and causes these cells to die. This medicine is used to treat ovarian cancer, breast cancer, and other cancers. This medicine may be used for other purposes; ask your health care provider or pharmacist if you have questions. What should I tell my health care provider before I take this medicine? They need to know if you have any of these conditions: -blood disorders -irregular heartbeat -infection (especially a virus infection such as chickenpox, cold sores, or herpes) -liver disease -previous or ongoing radiation therapy -an unusual or allergic reaction to paclitaxel, alcohol, polyoxyethylated castor oil, other chemotherapy agents, other medicines, foods, dyes, or  preservatives -pregnant or trying to get pregnant -breast-feeding How should I use this medicine? This drug is given as an infusion into a vein. It is administered in a hospital or clinic by a specially trained health care professional. Talk to your pediatrician regarding the use of this medicine in children. Special care may be needed. Overdosage: If you think you have taken too much of this medicine contact a poison control center or emergency room at once. NOTE: This medicine is only for you. Do not share this medicine with others. What if I miss a dose? It is important not to miss your dose. Call your doctor or health care professional if you are unable to keep an appointment. What may interact with this medicine? Do not take this medicine with any of the following medications: -disulfiram -metronidazole This medicine may also interact with the following medications: -cyclosporine -diazepam -ketoconazole -medicines to increase blood counts like filgrastim, pegfilgrastim, sargramostim -other chemotherapy drugs like cisplatin, doxorubicin, epirubicin, etoposide, teniposide, vincristine -quinidine -testosterone -vaccines -verapamil Talk to your doctor or health care professional before taking any of these medicines: -acetaminophen -aspirin -ibuprofen -ketoprofen -naproxen This list may not describe all possible interactions. Give your health care provider a list of all the medicines, herbs, non-prescription drugs, or dietary supplements you use. Also tell them if you smoke, drink alcohol, or use illegal drugs. Some items may interact with your medicine. What should I watch for while using this medicine? Your condition will be monitored carefully while you are receiving this medicine. You will need important blood work done while you are taking this medicine. This drug may make you feel generally unwell. This is not uncommon, as chemotherapy can  affect healthy cells as well as cancer  cells. Report any side effects. Continue your course of treatment even though you feel ill unless your doctor tells you to stop. This medicine can cause serious allergic reactions. To reduce your risk you will need to take other medicine(s) before treatment with this medicine. In some cases, you may be given additional medicines to help with side effects. Follow all directions for their use. Call your doctor or health care professional for advice if you get a fever, chills or sore throat, or other symptoms of a cold or flu. Do not treat yourself. This drug decreases your body's ability to fight infections. Try to avoid being around people who are sick. This medicine may increase your risk to bruise or bleed. Call your doctor or health care professional if you notice any unusual bleeding. Be careful brushing and flossing your teeth or using a toothpick because you may get an infection or bleed more easily. If you have any dental work done, tell your dentist you are receiving this medicine. Avoid taking products that contain aspirin, acetaminophen, ibuprofen, naproxen, or ketoprofen unless instructed by your doctor. These medicines may hide a fever. Do not become pregnant while taking this medicine. Women should inform their doctor if they wish to become pregnant or think they might be pregnant. There is a potential for serious side effects to an unborn child. Talk to your health care professional or pharmacist for more information. Do not breast-feed an infant while taking this medicine. Men are advised not to father a child while receiving this medicine. This product may contain alcohol. Ask your pharmacist or healthcare provider if this medicine contains alcohol. Be sure to tell all healthcare providers you are taking this medicine. Certain medicines, like metronidazole and disulfiram, can cause an unpleasant reaction when taken with alcohol. The reaction includes flushing, headache, nausea, vomiting,  sweating, and increased thirst. The reaction can last from 30 minutes to several hours. What side effects may I notice from receiving this medicine? Side effects that you should report to your doctor or health care professional as soon as possible: -allergic reactions like skin rash, itching or hives, swelling of the face, lips, or tongue -low blood counts - This drug may decrease the number of white blood cells, red blood cells and platelets. You may be at increased risk for infections and bleeding. -signs of infection - fever or chills, cough, sore throat, pain or difficulty passing urine -signs of decreased platelets or bleeding - bruising, pinpoint red spots on the skin, black, tarry stools, nosebleeds -signs of decreased red blood cells - unusually weak or tired, fainting spells, lightheadedness -breathing problems -chest pain -high or low blood pressure -mouth sores -nausea and vomiting -pain, swelling, redness or irritation at the injection site -pain, tingling, numbness in the hands or feet -slow or irregular heartbeat -swelling of the ankle, feet, hands Side effects that usually do not require medical attention (report to your doctor or health care professional if they continue or are bothersome): -bone pain -complete hair loss including hair on your head, underarms, pubic hair, eyebrows, and eyelashes -changes in the color of fingernails -diarrhea -loosening of the fingernails -loss of appetite -muscle or joint pain -red flush to skin -sweating This list may not describe all possible side effects. Call your doctor for medical advice about side effects. You may report side effects to FDA at 1-800-FDA-1088. Where should I keep my medicine? This drug is given in a hospital or clinic and  will not be stored at home. NOTE: This sheet is a summary. It may not cover all possible information. If you have questions about this medicine, talk to your doctor, pharmacist, or health care  provider.    2016, [employer]. (2014-12-18 13:02:56)    Carboplatin injection What is this medicine? CARBOPLATIN (KAR boe pla tin) is a chemotherapy drug. It targets fast dividing cells, like cancer cells, and causes these cells to die. This medicine is used to treat ovarian cancer and many other cancers. This medicine may be used for other purposes; ask your health care provider or pharmacist if you have questions. What should I tell my health care provider before I take this medicine? They need to know if you have any of these conditions: -blood disorders -hearing problems -kidney disease -recent or ongoing radiation therapy -an unusual or allergic reaction to carboplatin, cisplatin, other chemotherapy, other medicines, foods, dyes, or preservatives -pregnant or trying to get pregnant -breast-feeding How should I use this medicine? This drug is usually given as an infusion into a vein. It is administered in a hospital or clinic by a specially trained health care professional. Talk to your pediatrician regarding the use of this medicine in children. Special care may be needed. Overdosage: If you think you have taken too much of this medicine contact a poison control center or emergency room at once. NOTE: This medicine is only for you. Do not share this medicine with others. What if I miss a dose? It is important not to miss a dose. Call your doctor or health care professional if you are unable to keep an appointment. What may interact with this medicine? -medicines for seizures -medicines to increase blood counts like filgrastim, pegfilgrastim, sargramostim -some antibiotics like amikacin, gentamicin, neomycin, streptomycin, tobramycin -vaccines Talk to your doctor or health care professional before taking any of these medicines: -acetaminophen -aspirin -ibuprofen -ketoprofen -naproxen This list may not describe all possible interactions. Give your health care  provider a list of all the medicines, herbs, non-prescription drugs, or dietary supplements you use. Also tell them if you smoke, drink alcohol, or use illegal drugs. Some items may interact with your medicine. What should I watch for while using this medicine? Your condition will be monitored carefully while you are receiving this medicine. You will need important blood work done while you are taking this medicine. This drug may make you feel generally unwell. This is not uncommon, as chemotherapy can affect healthy cells as well as cancer cells. Report any side effects. Continue your course of treatment even though you feel ill unless your doctor tells you to stop. In some cases, you may be given additional medicines to help with side effects. Follow all directions for their use. Call your doctor or health care professional for advice if you get a fever, chills or sore throat, or other symptoms of a cold or flu. Do not treat yourself. This drug decreases your body's ability to fight infections. Try to avoid being around people who are sick. This medicine may increase your risk to bruise or bleed. Call your doctor or health care professional if you notice any unusual bleeding. Be careful brushing and flossing your teeth or using a toothpick because you may get an infection or bleed more easily. If you have any dental work done, tell your dentist you are receiving this medicine. Avoid taking products that contain aspirin, acetaminophen, ibuprofen, naproxen, or ketoprofen unless instructed by your doctor. These medicines may hide a fever. Do  not become pregnant while taking this medicine. Women should inform their doctor if they wish to become pregnant or think they might be pregnant. There is a potential for serious side effects to an unborn child. Talk to your health care professional or pharmacist for more information. Do not breast-feed an infant while taking this medicine. What side effects may I  notice from receiving this medicine? Side effects that you should report to your doctor or health care professional as soon as possible: -allergic reactions like skin rash, itching or hives, swelling of the face, lips, or tongue -signs of infection - fever or chills, cough, sore throat, pain or difficulty passing urine -signs of decreased platelets or bleeding - bruising, pinpoint red spots on the skin, black, tarry stools, nosebleeds -signs of decreased red blood cells - unusually weak or tired, fainting spells, lightheadedness -breathing problems -changes in hearing -changes in vision -chest pain -high blood pressure -low blood counts - This drug may decrease the number of white blood cells, red blood cells and platelets. You may be at increased risk for infections and bleeding. -nausea and vomiting -pain, swelling, redness or irritation at the injection site -pain, tingling, numbness in the hands or feet -problems with balance, talking, walking -trouble passing urine or change in the amount of urine Side effects that usually do not require medical attention (report to your doctor or health care professional if they continue or are bothersome): -hair loss -loss of appetite -metallic taste in the mouth or changes in taste This list may not describe all possible side effects. Call your doctor for medical advice about side effects. You may report side effects to FDA at 1-800-FDA-1088. Where should I keep my medicine? This drug is given in a hospital or clinic and will not be stored at home. NOTE: This sheet is a summary. It may not cover all possible information. If you have questions about this medicine, talk to your doctor, pharmacist, or health care provider.    2016, [employer]. (2007-08-07 14:38:05)

## 2015-03-06 ENCOUNTER — Ambulatory Visit: Payer: 59

## 2015-03-06 ENCOUNTER — Ambulatory Visit: Payer: 59 | Admitting: Nurse Practitioner

## 2015-03-06 ENCOUNTER — Other Ambulatory Visit: Payer: 59

## 2015-03-10 ENCOUNTER — Other Ambulatory Visit: Payer: Self-pay | Admitting: Nurse Practitioner

## 2015-03-10 ENCOUNTER — Other Ambulatory Visit: Payer: Self-pay | Admitting: *Deleted

## 2015-03-10 ENCOUNTER — Telehealth: Payer: Self-pay | Admitting: Oncology

## 2015-03-10 ENCOUNTER — Telehealth: Payer: Self-pay | Admitting: *Deleted

## 2015-03-10 MED ORDER — TOBRAMYCIN-DEXAMETHASONE 0.3-0.1 % OP SUSP: 1.0000 [drp] | OPHTHALMIC | Status: DC

## 2015-03-10 NOTE — Telephone Encounter (Signed)
Called pt to let her know that Rx for Tobradex has been sent to her pharmacy for the tearing of her eyes per H. Boelter,NP. I also told her we will be making some adjustments to her schedule. I will call her back with the details after those adjustments have been made.

## 2015-03-10 NOTE — Telephone Encounter (Signed)
Pt called in question of her chemo schedule and c/o tearing. Informed pt that [facility] is working on her schedule and will call in some eyedrops since visine is not working.  Denies further needs at this time.

## 2015-03-10 NOTE — Telephone Encounter (Signed)
RETURNED PATIENT CALL RE ADJUSTING 10/27 TX APPOINTMENT TO BE CLOSER TO F/U. APPOINTMENT ADJUSTED - SPOKE WITH PATIENT SHE IS AWARE - START TIME REMAINS THE SAME.

## 2015-03-10 NOTE — Telephone Encounter (Signed)
Called pt back to let her know of the upcoming schedule changes. She was grateful for the accomodation made. Message to be fwd to [employer].

## 2015-03-11 ENCOUNTER — Other Ambulatory Visit: Payer: 59

## 2015-03-11 ENCOUNTER — Telehealth: Payer: Self-pay | Admitting: Nurse Practitioner

## 2015-03-11 ENCOUNTER — Ambulatory Visit: Payer: 59

## 2015-03-11 ENCOUNTER — Ambulatory Visit: Payer: 59 | Admitting: Nurse Practitioner

## 2015-03-11 NOTE — Telephone Encounter (Signed)
Appointments adjusted per pof and patient will get a new avs at 10/27 appointment

## 2015-03-12 ENCOUNTER — Telehealth: Payer: Self-pay | Admitting: Nurse Practitioner

## 2015-03-12 ENCOUNTER — Ambulatory Visit (HOSPITAL_BASED_OUTPATIENT_CLINIC_OR_DEPARTMENT_OTHER): Payer: 59 | Admitting: Nurse Practitioner

## 2015-03-12 ENCOUNTER — Other Ambulatory Visit (HOSPITAL_BASED_OUTPATIENT_CLINIC_OR_DEPARTMENT_OTHER): Payer: 59

## 2015-03-12 ENCOUNTER — Ambulatory Visit (HOSPITAL_BASED_OUTPATIENT_CLINIC_OR_DEPARTMENT_OTHER): Payer: 59

## 2015-03-12 ENCOUNTER — Encounter: Payer: Self-pay | Admitting: *Deleted

## 2015-03-12 ENCOUNTER — Encounter: Payer: Self-pay | Admitting: Nurse Practitioner

## 2015-03-12 VITALS — BP 132/89 | HR 90 | Temp 98.3°F | Resp 19 | Ht 65.0 in | Wt 224.6 lb

## 2015-03-12 DIAGNOSIS — Z5111 Encounter for antineoplastic chemotherapy: Secondary | ICD-10-CM

## 2015-03-12 DIAGNOSIS — Z171 Estrogen receptor negative status [ER-]: Secondary | ICD-10-CM

## 2015-03-12 DIAGNOSIS — C50311 Malignant neoplasm of lower-inner quadrant of right female breast: Secondary | ICD-10-CM

## 2015-03-12 LAB — COMPREHENSIVE METABOLIC PANEL (CC13)
ALBUMIN: 3.8 g/dL (ref 3.5–5.0)
ALK PHOS: 74 U/L (ref 40–150)
ALT: 32 U/L (ref 0–55)
AST: 15 U/L (ref 5–34)
Anion Gap: 8 mEq/L (ref 3–11)
BILIRUBIN TOTAL: 0.32 mg/dL (ref 0.20–1.20)
BUN: 12.2 mg/dL (ref 7.0–26.0)
CALCIUM: 9.3 mg/dL (ref 8.4–10.4)
CO2: 24 mEq/L (ref 22–29)
Chloride: 109 mEq/L (ref 98–109)
Creatinine: 0.8 mg/dL (ref 0.6–1.1)
EGFR: >90 mL/min/{1.73_m2} (ref >90)
Glucose: 127 mg/dl (ref 70–140)
Potassium: 3.8 mEq/L (ref 3.5–5.1)
SODIUM: 140 meq/L (ref 136–145)
Total Protein: 6.7 g/dL (ref 6.4–8.3)

## 2015-03-12 LAB — CBC WITH DIFFERENTIAL/PLATELET
BASO%: 0.7 % (ref 0.0–2.0)
BASOS ABS: 0 10*3/uL (ref 0.0–0.1)
EOS ABS: 0 10*3/uL (ref 0.0–0.5)
EOS%: 0.3 % (ref 0.0–7.0)
HCT: 29 % — ABNORMAL LOW (ref 34.8–46.6)
HGB: 9.3 g/dL — ABNORMAL LOW (ref 11.6–15.9)
LYMPH%: 20.9 % (ref 14.0–49.7)
MCH: 25.7 pg (ref 25.1–34.0)
MCHC: 32 g/dL (ref 31.5–36.0)
MCV: 80.3 fL (ref 79.5–101.0)
MONO#: 0.3 10*3/uL (ref 0.1–0.9)
MONO%: 4.8 % (ref 0.0–14.0)
NEUT#: 3.9 10*3/uL (ref 1.5–6.5)
NEUT%: 73.3 % (ref 38.4–76.8)
Platelets: 329 10*3/uL (ref 145–400)
RBC: 3.62 10*6/uL — ABNORMAL LOW (ref 3.70–5.45)
RDW: 19.2 % — AB (ref 11.2–14.5)
WBC: 5.4 10*3/uL (ref 3.9–10.3)
lymph#: 1.1 10*3/uL (ref 0.9–3.3)

## 2015-03-12 MED ORDER — FAMOTIDINE IN NACL 20-0.9 MG/50ML-% IV SOLN
20.0000 mg | Freq: Once | INTRAVENOUS | Status: AC
  Administered 2015-03-12: 20 mg via INTRAVENOUS

## 2015-03-12 MED ORDER — FAMOTIDINE IN NACL 20-0.9 MG/50ML-% IV SOLN
INTRAVENOUS | Status: AC
  Filled 2015-03-12: qty 50

## 2015-03-12 MED ORDER — SODIUM CHLORIDE 0.9 % IJ SOLN
10.0000 mL | INTRAMUSCULAR | Status: DC | PRN
  Administered 2015-03-12: 10 mL
  Filled 2015-03-12: qty 10

## 2015-03-12 MED ORDER — PACLITAXEL CHEMO INJECTION 300 MG/50ML
80.0000 mg/m2 | Freq: Once | INTRAVENOUS | Status: AC
  Administered 2015-03-12: 174 mg via INTRAVENOUS
  Filled 2015-03-12: qty 29

## 2015-03-12 MED ORDER — HEPARIN SOD (PORK) LOCK FLUSH 100 UNIT/ML IV SOLN
500.0000 [IU] | Freq: Once | INTRAVENOUS | Status: AC | PRN
  Administered 2015-03-12: 500 [IU]
  Filled 2015-03-12: qty 5

## 2015-03-12 MED ORDER — SODIUM CHLORIDE 0.9 % IV SOLN
Freq: Once | INTRAVENOUS | Status: AC
  Administered 2015-03-12: 10:00:00 via INTRAVENOUS
  Filled 2015-03-12: qty 8

## 2015-03-12 MED ORDER — DIPHENHYDRAMINE HCL 50 MG/ML IJ SOLN
INTRAMUSCULAR | Status: AC
  Filled 2015-03-12: qty 1

## 2015-03-12 MED ORDER — SODIUM CHLORIDE 0.9 % IV SOLN
300.0000 mg | Freq: Once | INTRAVENOUS | Status: AC
  Administered 2015-03-12: 300 mg via INTRAVENOUS
  Filled 2015-03-12: qty 30

## 2015-03-12 MED ORDER — SODIUM CHLORIDE 0.9 % IV SOLN
Freq: Once | INTRAVENOUS | Status: AC
  Administered 2015-03-12: 10:00:00 via INTRAVENOUS

## 2015-03-12 MED ORDER — DIPHENHYDRAMINE HCL 50 MG/ML IJ SOLN
50.0000 mg | Freq: Once | INTRAMUSCULAR | Status: AC
  Administered 2015-03-12: 50 mg via INTRAVENOUS

## 2015-03-12 NOTE — Telephone Encounter (Signed)
Appointments adjusted per pof and patient aware to get an avs in chemo

## 2015-03-12 NOTE — Patient Instructions (Signed)
[place] Discharge Instructions for Patients Receiving Chemotherapy  Today you received the following chemotherapy agents taxol/carboplatin  To help prevent nausea and vomiting after your treatment, we encourage you to take your nausea medication as directed   If you develop nausea and vomiting that is not controlled by your nausea medication, call the clinic.   BELOW ARE SYMPTOMS THAT SHOULD BE REPORTED IMMEDIATELY:  *FEVER GREATER THAN 100.5 F  *CHILLS WITH OR WITHOUT FEVER  NAUSEA AND VOMITING THAT IS NOT CONTROLLED WITH YOUR NAUSEA MEDICATION  *UNUSUAL SHORTNESS OF BREATH  *UNUSUAL BRUISING OR BLEEDING  TENDERNESS IN MOUTH AND THROAT WITH OR WITHOUT PRESENCE OF ULCERS  *URINARY PROBLEMS  *BOWEL PROBLEMS  UNUSUAL RASH Items with * indicate a potential emergency and should be followed up as soon as possible.  Feel free to call the clinic you have any questions or concerns. The clinic phone number is [phone].

## 2015-03-12 NOTE — Progress Notes (Signed)
Susan Wilson  Telephone:(336) [phone] [phone]) [phone]     ID: Susan Wilson DOB: 07-Jun-1975  MR#: [id number]  FIE#:[id number]  Patient Care Team: Donnamae Jude, MD as PCP - General (Obstetrics and Gynecology) Rolm Bookbinder, MD as Consulting Physician (General Surgery) Chauncey Cruel, MD as Consulting Physician (Oncology) Thea Silversmith, MD as Consulting Physician (Radiation Oncology) [name], RN as Registered Nurse Rockwell Germany, RN as Registered Nurse Holley Bouche, NP as Nurse Practitioner (Nurse Practitioner) PCP: Donnamae Jude, MD OTHER MD:  CHIEF COMPLAINT: Estrogen receptor negative breast cancer  CURRENT TREATMENT: Neoadjuvant chemotherapy  BREAST CANCER HISTORY: From the original intake note:  Susan Wilson herself noted a mass in her right breast sometime around April. Initially she thought it might be related to menstruation, but as it did not change and eventually became tender, she brought it to her physician's attention. On 11/20/2014 patient underwent bilateral diagnostic mammography with tomosynthesis and right breast ultrasonography at the breast Center. The breast density was category B. There was a hyperdense mass in the right lower inner quadrant associated with skin thickening. There was also a 5 mm nodule posteriorly at the 8:30 o'clock position in the right breast. There were several hyperdense nodules in the right axilla. On physical exam there was a firm fixed mass in the right breast at the 5:00 position. By ultrasound this was lobulated and appear to involve the skin. It measures up to 4.1 cm. There was no sonographic correlation to the 5 mm nodule seen in a different area of the right breast. The right axilla showed 3 hypervascular lymph nodes with prominent cortical thickening, measuring less than 1.5 cm.  On 11/21/2014 the patient underwent right breast biopsy (5:00 mass) and biopsy of one of the suspicious right  axillary lymph nodes. 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  The patient's subsequent history is as detailed below  INTERVAL HISTORY: Susan Wilson returns today for follow up of her breast cancer, accompanied by her husband. Today she is due for cycle 3 of 12 weekly planned doses of carboplatin and paclitaxel. Again last week she developed a mild rash on her chin, and required extra benadryl, but this promptly resolved after it was administrated. She does not have any issue with this at home. She denies hives, shortness of breath, or tongue thickening.  REVIEW OF SYSTEMS: Susan Wilson denies fevers or chills. She has some nausea but denies vomiting. Her bowel movements are soft bur formed. Her appetite is fair secondary to taste changes. She denies mouth sores. She was prescribed tobradex for her runny eyes and this is much improved. Her energy level is decent. A detailed review of systems is otherwise stable.  PAST MEDICAL HISTORY: Past Medical History  Diagnosis Date  . Breast cancer of lower-inner quadrant of right female breast ([place]) 11/25/2014  . Breast cancer ([place])   . Hernia, umbilical   . Back pain   . Headache   . Anemia   . Hernia, umbilical   . Family history of breast cancer     PAST SURGICAL HISTORY: Past Surgical History  Procedure Laterality Date  . Portacath placement Right 12/16/2014    Procedure: INSERTION PORT-A-CATH WITH ULTRASOUND;  Surgeon: Rolm Bookbinder, MD;  Location: [place];  Service: General;  Laterality:  Right;    FAMILY HISTORY Family History  Problem Relation Age of Onset  . Mesothelioma Maternal Grandfather     asbestos exposure, died in his 13s  . Heart defect Sister 0  . Heart disease Paternal Aunt   . Heart disease  Paternal Uncle   . Breast cancer Paternal Grandmother   . Diabetes Paternal Grandmother   . Cancer Paternal Grandfather     NOS  . Colon cancer Other 64    MGMs brother with colon cancer  . Cancer Other     several of MGF's sisters with cancer NOS  . Liver cancer Other     MGF's brother   the patient's parents are living, her father being 21 and her mother 13 as of July 2016. The patient had no brothers. One sister died at age 6 from cardiac problems. The other sister is in good health. On the maternal side there is a history of colon cancer and an uncle age 10, liver cancer in a great uncle and mesothelioma in the maternal grandfather.  GYNECOLOGIC HISTORY:  No LMP recorded. Menarche age 64. The patient is GX P2. 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 The daughters are Susan Wilson and Susan Wilson, age 23 and 103. The patient attends a local [employer]: Not in place   HEALTH MAINTENANCE: Social History  Substance Use Topics  . Smoking status: Never Smoker   . Smokeless tobacco: Never Used  . Alcohol Use: Yes     Comment: rarely     Colonoscopy:  PAP: 2014  Bone density:  Lipid panel:  No Known Allergies  Current Outpatient Prescriptions  Medication Sig Dispense Refill  . ibuprofen (ADVIL,MOTRIN) 200 MG tablet Take 400 mg by mouth every 6 (six) hours as needed (for pain).     Marland Kitchen lidocaine-prilocaine (EMLA) cream APPLY TO AFFTECTED AREA ONCE AS DIRECTED  0  . LORazepam (ATIVAN) 0.5 MG tablet Take 0.5 mg by mouth at bedtime.  0  . ondansetron (ZOFRAN) 8 MG tablet TAKE 1 TABLET BY MOUTH TWICE DAILY AS NEEDED. START ON 3RD DAY AFTER CHEMOTHERAPY  0  . tobramycin-dexamethasone (TOBRADEX) ophthalmic solution Place 1 drop into both eyes every 4 (four) hours while awake. 5 mL 1  . HYDROcodone-acetaminophen (NORCO/VICODIN) 5-325 MG tablet  Take 1 tablet by mouth every 6 (six) hours as needed for moderate pain. (Patient not taking: Reported on 02/26/2015) 20 tablet 0  . prochlorperazine (COMPAZINE) 10 MG tablet Take 10 mg by mouth every 6 (six) hours as needed for nausea or vomiting.   0   No current facility-administered medications for this visit.    OBJECTIVE: Young African-American woman in no acute distress Filed Vitals:   03/12/15 0844  BP: 132/89  Pulse: 90  Temp: 98.3 F (36.8 C)  Resp: 19     Body mass index is 37.38 kg/(m^2).    ECOG FS:1 - Symptomatic but completely ambulatory  Skin: warm, dry  HEENT: sclerae anicteric, conjunctivae pink, oropharynx clear. No thrush or mucositis.  Lymph Nodes: No cervical or supraclavicular lymphadenopathy  Lungs: clear to auscultation bilaterally, no rales, wheezes, or rhonci  Heart: regular rate and rhythm  Abdomen: round, soft, non tender, positive bowel sounds  Musculoskeletal: No focal spinal tenderness, no peripheral edema  Neuro: non focal, well oriented, positive affect  Breasts: deferred  LAB RESULTS:  CMP  Component Value Date/Time   NA 140 03/12/2015 0819   NA 136 12/12/2014 1222   K 3.8 03/12/2015 0819   K 4.2 12/12/2014 1222   CL 102 12/12/2014 1222   CO2 24 03/12/2015 0819   CO2 25 12/12/2014 1222   GLUCOSE 127 03/12/2015 0819   GLUCOSE 98 12/12/2014 1222   BUN 12.2 03/12/2015 0819   BUN 14 12/12/2014 1222   CREATININE 0.8 03/12/2015 0819   CREATININE 0.69 12/12/2014 1222   CALCIUM 9.3 03/12/2015 0819   CALCIUM 9.3 12/12/2014 1222   PROT 6.7 03/12/2015 0819   ALBUMIN 3.8 03/12/2015 0819   AST 15 03/12/2015 0819   ALT 32 03/12/2015 0819   ALKPHOS 74 03/12/2015 0819   BILITOT 0.32 03/12/2015 0819   GFRNONAA >60 12/12/2014 1222   GFRAA >60 12/12/2014 1222    INo results found for: SPEP, UPEP  Lab Results  Component Value Date   WBC 5.4 03/12/2015   NEUTROABS 3.9 03/12/2015   HGB 9.3* 03/12/2015   HCT 29.0* 03/12/2015   MCV 80.3  03/12/2015   PLT 329 03/12/2015      Chemistry      Component Value Date/Time   NA 140 03/12/2015 0819   NA 136 12/12/2014 1222   K 3.8 03/12/2015 0819   K 4.2 12/12/2014 1222   CL 102 12/12/2014 1222   CO2 24 03/12/2015 0819   CO2 25 12/12/2014 1222   BUN 12.2 03/12/2015 0819   BUN 14 12/12/2014 1222   CREATININE 0.8 03/12/2015 0819   CREATININE 0.69 12/12/2014 1222      Component Value Date/Time   CALCIUM 9.3 03/12/2015 0819   CALCIUM 9.3 12/12/2014 1222   ALKPHOS 74 03/12/2015 0819   AST 15 03/12/2015 0819   ALT 32 03/12/2015 0819   BILITOT 0.32 03/12/2015 0819       No results found for: LABCA2  No components found for: LABCA125  No results for input(s): INR in the last 168 hours.  Urinalysis    Component Value Date/Time   LABSPEC 1.020 05/06/2009 1439   PHURINE 7.0 05/06/2009 1439   GLUCOSEU NEGATIVE 05/06/2009 1439   HGBUR NEGATIVE 05/06/2009 1439   BILIRUBINUR NEGATIVE 05/06/2009 1439   KETONESUR NEGATIVE 05/06/2009 1439   PROTEINUR NEGATIVE 05/06/2009 1439   UROBILINOGEN 0.2 05/06/2009 1439   NITRITE NEGATIVE 05/06/2009 1439   LEUKOCYTESUR  05/06/2009 1439    NEGATIVE Biochemical Testing Only. Please order routine urinalysis from main lab if confirmatory testing is needed.    STUDIES: Mr Breast Bilateral W Wo Contrast  02/17/2015  CLINICAL DATA:  Patient with recent diagnosis of right breast cancer, undergoing neoadjuvant chemotherapy. LABS:  None. EXAM: BILATERAL BREAST MRI WITH AND WITHOUT CONTRAST TECHNIQUE: Multiplanar, multisequence MR images of both breasts were obtained prior to and following the intravenous administration of 20 ml of MultiHance. THREE-DIMENSIONAL MR IMAGE RENDERING ON INDEPENDENT WORKSTATION: Three-dimensional MR images were rendered by post-processing of the original MR data on an independent workstation. The three-dimensional MR images were interpreted, and findings are reported in the following complete MRI report for this  study. Three dimensional images were evaluated at the independent DynaCad workstation COMPARISON:  MRI of the breast dated 12/05/2014, mammogram and ultrasound dated 11/21/2014 and Previous mammograms. FINDINGS: Breast composition: b. Scattered fibroglandular tissue. Background parenchymal enhancement: Moderate. Right breast: The right breast 5 o'clock tumor has significantly decreased in size, and is less avid demonstrating predominantly progressive kinetics of enhancement. It now measures 3.9 by 2.9 by 1.6 cm. Continued  extension to the overlying skin is noted. However the pectoralis muscle appears involved. Left breast: No mass or abnormal enhancement. Signal void artifact is seen from tissue marker in the left breast lower outer quadrant, anterior depth, from prior benign core needle biopsy. Lymph nodes: No abnormal appearing lymph nodes. Ancillary findings:  None. IMPRESSION: Interval decrease in the size and intensity of enhancement of the known right breast 5 o'clock malignancy, consistent with good response to neoadjuvant chemotherapy. RECOMMENDATION: Continue with the plan of care. BI-RADS CATEGORY  6: Known biopsy-proven malignancy. Electronically Signed   By: Fidela Salisbury M.D.   On: 02/17/2015 15:46   Mr Attempted Daymon Larsen Report  02/16/2015  This examination belongs to an outside facility and is stored here for comparison purposes only.  Contact the originating outside institution for any associated report or interpretation.   ASSESSMENT: 39 y.o. [place] woman s/p Right breast biopsy 11/21/2014 for a clinical T2 NX, stage 2 invasive ductal carcinoma, grade 2 or 3, estrogen and progesterone receptor negative, HER-2 equivocal, with an Mib-1 of 20%  (a) biopsy of a suspicious axillary lymph node same day was negative, but discordant  (b) review of 80 additional tumor cells by FISH still showed HER-2 not amplified; tumor should be treated as a triple negative  (1) neoadjuvant chemotherapy  started 01/01/2015 consisting of cyclophosphamide and doxorubicin in dose dense fashion 4, with onpro support, completed 02/13/2015, to be followed by paclitaxel/ carboplatin weekly 12   (2) genetics testing 12/15/2014 through the Breast/Ovarian gene panel offered by [facility] found no deleterious mutations in ATM, BARD1, BRCA1, BRCA2, BRIP1, CDH1, CHEK2, EPCAM, FANCC, MLH1, MSH2, MSH6, NBN, PALB2, PMS2, PTEN, RAD51C, RAD51D, TP53, and XRCC2  (3) definitive surgery  to follow chemotherapy  (4) radiation to follow surgery  PLAN: Susan Wilson continues to perform well during treatment. The labs were reviewed in detail and were stable. She will proceed with cycle 2 of carboplatin and paclitaxel as planned today.   I have made her appointments out through December, reflecting her preference to be treated on Thursdays.  Susan Wilson will return in 1 week for labs and cycle 4 of treatment. She understands and agrees with this plan. She knows the goal of treatment in her case is cure. She has been encouraged to call with any issues that might arise before her next visit here.   Laurie Panda, NP   03/12/2015 9:11 AM

## 2015-03-13 ENCOUNTER — Telehealth: Payer: Self-pay | Admitting: *Deleted

## 2015-03-13 ENCOUNTER — Other Ambulatory Visit: Payer: 59

## 2015-03-13 ENCOUNTER — Ambulatory Visit: Payer: 59

## 2015-03-13 ENCOUNTER — Ambulatory Visit: Payer: 59 | Admitting: Nurse Practitioner

## 2015-03-13 NOTE — Telephone Encounter (Signed)
Pt called concerning schedule on 12/1 Informed pt that I will send POF to have lab appt prior to [facility] appt. Denies further needs at this time.

## 2015-03-18 ENCOUNTER — Encounter: Payer: Self-pay | Admitting: *Deleted

## 2015-03-19 ENCOUNTER — Encounter: Payer: Self-pay | Admitting: Oncology

## 2015-03-19 ENCOUNTER — Other Ambulatory Visit (HOSPITAL_BASED_OUTPATIENT_CLINIC_OR_DEPARTMENT_OTHER): Payer: 59

## 2015-03-19 ENCOUNTER — Encounter: Payer: Self-pay | Admitting: *Deleted

## 2015-03-19 ENCOUNTER — Ambulatory Visit: Payer: 59

## 2015-03-19 ENCOUNTER — Ambulatory Visit (HOSPITAL_BASED_OUTPATIENT_CLINIC_OR_DEPARTMENT_OTHER): Payer: 59 | Admitting: Oncology

## 2015-03-19 VITALS — BP 104/66 | HR 92 | Temp 98.2°F | Resp 17 | Ht 65.0 in | Wt 222.7 lb

## 2015-03-19 DIAGNOSIS — C50311 Malignant neoplasm of lower-inner quadrant of right female breast: Secondary | ICD-10-CM

## 2015-03-19 DIAGNOSIS — C773 Secondary and unspecified malignant neoplasm of axilla and upper limb lymph nodes: Secondary | ICD-10-CM

## 2015-03-19 DIAGNOSIS — Z171 Estrogen receptor negative status [ER-]: Secondary | ICD-10-CM

## 2015-03-19 DIAGNOSIS — Z5111 Encounter for antineoplastic chemotherapy: Secondary | ICD-10-CM

## 2015-03-19 LAB — COMPREHENSIVE METABOLIC PANEL (CC13)
ALBUMIN: 3.7 g/dL (ref 3.5–5.0)
ALK PHOS: 70 U/L (ref 40–150)
ALT: 42 U/L (ref 0–55)
AST: 21 U/L (ref 5–34)
Anion Gap: 4 mEq/L (ref 3–11)
BUN: 10.6 mg/dL (ref 7.0–26.0)
CALCIUM: 9.3 mg/dL (ref 8.4–10.4)
CHLORIDE: 105 meq/L (ref 98–109)
CO2: 27 mEq/L (ref 22–29)
Creatinine: 0.7 mg/dL (ref 0.6–1.1)
Glucose: 126 mg/dl (ref 70–140)
POTASSIUM: 4.1 meq/L (ref 3.5–5.1)
SODIUM: 136 meq/L (ref 136–145)
Total Bilirubin: 0.5 mg/dL (ref 0.20–1.20)
Total Protein: 6.6 g/dL (ref 6.4–8.3)

## 2015-03-19 LAB — CBC WITH DIFFERENTIAL/PLATELET
BASO%: 1.3 % (ref 0.0–2.0)
BASOS ABS: 0 10*3/uL (ref 0.0–0.1)
EOS ABS: 0 10*3/uL (ref 0.0–0.5)
EOS%: 0.7 % (ref 0.0–7.0)
HCT: 28.3 % — ABNORMAL LOW (ref 34.8–46.6)
HGB: 9 g/dL — ABNORMAL LOW (ref 11.6–15.9)
LYMPH%: 22.3 % (ref 14.0–49.7)
MCH: 25.7 pg (ref 25.1–34.0)
MCHC: 31.8 g/dL (ref 31.5–36.0)
MCV: 80.9 fL (ref 79.5–101.0)
MONO#: 0.2 10*3/uL (ref 0.1–0.9)
MONO%: 6.2 % (ref 0.0–14.0)
NEUT%: 69.5 % (ref 38.4–76.8)
NEUTROS ABS: 2.5 10*3/uL (ref 1.5–6.5)
PLATELETS: 256 10*3/uL (ref 145–400)
RBC: 3.5 10*6/uL — AB (ref 3.70–5.45)
RDW: 19.6 % — ABNORMAL HIGH (ref 11.2–14.5)
WBC: 3.5 10*3/uL — ABNORMAL LOW (ref 3.9–10.3)
lymph#: 0.8 10*3/uL — ABNORMAL LOW (ref 0.9–3.3)

## 2015-03-19 MED ORDER — SODIUM CHLORIDE 0.9 % IV SOLN
Freq: Once | INTRAVENOUS | Status: AC
  Administered 2015-03-19: 11:00:00 via INTRAVENOUS

## 2015-03-19 MED ORDER — FAMOTIDINE IN NACL 20-0.9 MG/50ML-% IV SOLN
20.0000 mg | Freq: Once | INTRAVENOUS | Status: AC
  Administered 2015-03-19: 20 mg via INTRAVENOUS

## 2015-03-19 MED ORDER — FAMOTIDINE IN NACL 20-0.9 MG/50ML-% IV SOLN
INTRAVENOUS | Status: AC
  Filled 2015-03-19: qty 50

## 2015-03-19 MED ORDER — SODIUM CHLORIDE 0.9 % IJ SOLN
10.0000 mL | INTRAMUSCULAR | Status: DC | PRN
Start: 2015-03-19 — End: 2015-03-19
  Administered 2015-03-19: 10 mL
  Filled 2015-03-19: qty 10

## 2015-03-19 MED ORDER — DIPHENHYDRAMINE HCL 50 MG/ML IJ SOLN
50.0000 mg | Freq: Once | INTRAMUSCULAR | Status: AC
  Administered 2015-03-19: 50 mg via INTRAVENOUS

## 2015-03-19 MED ORDER — HEPARIN SOD (PORK) LOCK FLUSH 100 UNIT/ML IV SOLN
500.0000 [IU] | Freq: Once | INTRAVENOUS | Status: AC | PRN
  Administered 2015-03-19: 500 [IU]
  Filled 2015-03-19: qty 5

## 2015-03-19 MED ORDER — PACLITAXEL CHEMO INJECTION 300 MG/50ML
80.0000 mg/m2 | Freq: Once | INTRAVENOUS | Status: AC
  Administered 2015-03-19: 174 mg via INTRAVENOUS
  Filled 2015-03-19: qty 29

## 2015-03-19 MED ORDER — DIPHENHYDRAMINE HCL 50 MG/ML IJ SOLN
INTRAMUSCULAR | Status: AC
  Filled 2015-03-19: qty 1

## 2015-03-19 MED ORDER — SODIUM CHLORIDE 0.9 % IV SOLN
300.0000 mg | Freq: Once | INTRAVENOUS | Status: AC
  Administered 2015-03-19: 300 mg via INTRAVENOUS
  Filled 2015-03-19: qty 30

## 2015-03-19 MED ORDER — SODIUM CHLORIDE 0.9 % IV SOLN
Freq: Once | INTRAVENOUS | Status: AC
  Administered 2015-03-19: 11:00:00 via INTRAVENOUS
  Filled 2015-03-19: qty 8

## 2015-03-19 NOTE — Progress Notes (Signed)
[place]  Telephone:(336) [phone] Fax:(336) [phone]     ID: Susan Wilson DOB: 30-May-1975  MR#: [id number]  PXT#:[id number]  Patient Care Team: Donnamae Jude, MD as PCP - General (Obstetrics and Gynecology) Rolm Bookbinder, MD as Consulting Physician (General Surgery) Chauncey Cruel, MD as Consulting Physician (Oncology) Thea Silversmith, MD as Consulting Physician (Radiation Oncology) [name], RN as Registered Nurse Rockwell Germany, RN as Registered Nurse Holley Bouche, NP as Nurse Practitioner (Nurse Practitioner) PCP: Donnamae Jude, MD OTHER MD:  CHIEF COMPLAINT: Estrogen receptor negative breast cancer  CURRENT TREATMENT: Neoadjuvant chemotherapy  BREAST CANCER HISTORY: From the original intake note:  Susan Wilson herself noted a mass in her right breast sometime around April. Initially she thought it might be related to menstruation, but as it did not change and eventually became tender, she brought it to her physician's attention. On 11/20/2014 patient underwent bilateral diagnostic mammography with tomosynthesis and right breast ultrasonography at the breast Center. The breast density was category B. There was a hyperdense mass in the right lower inner quadrant associated with skin thickening. There was also a 5 mm nodule posteriorly at the 8:30 o'clock position in the right breast. There were several hyperdense nodules in the right axilla. On physical exam there was a firm fixed mass in the right breast at the 5:00 position. By ultrasound this was lobulated and appear to involve the skin. It measures up to 4.1 cm. There was no sonographic correlation to the 5 mm nodule seen in a different area of the right breast. The right axilla showed 3 hypervascular lymph nodes with prominent cortical thickening, measuring less than 1.5 cm.  On 11/21/2014 the patient underwent right breast biopsy (5:00 mass) and biopsy of one of the suspicious right  axillary lymph nodes. 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  The patient's subsequent history is as detailed below  INTERVAL HISTORY: Susan Wilson returns today for follow up of her breast cancer, accompanied by her husband. Today she is due for cycle 4 of 12 weekly planned doses of carboplatin and paclitaxel. Again last week she developed a mild rash on her chin, and required extra benadryl, but this promptly resolved after it was administrated. She does not have any issue with this at home. She denies hives, shortness of breath, or tongue thickening.  REVIEW OF SYSTEMS: Susan Wilson denies fevers or chills. She has some nausea but denies vomiting. Her bowel movements are soft bur formed. Her appetite is fair secondary to taste changes. She denies mouth sores. She was prescribed tobradex for her runny eyes and this is much improved. Her energy level is decent. A detailed review of systems is otherwise stable.  PAST MEDICAL HISTORY: Past Medical History  Diagnosis Date  . Breast cancer of lower-inner quadrant of right female breast ([place]) 11/25/2014  . Breast cancer ([place])   . Hernia, umbilical   . Back pain   . Headache   . Anemia   . Hernia, umbilical   . Family history of breast cancer     PAST SURGICAL HISTORY: Past Surgical History  Procedure Laterality Date  . Portacath placement Right 12/16/2014    Procedure: INSERTION PORT-A-CATH WITH ULTRASOUND;  Surgeon: Rolm Bookbinder, MD;  Location: [place];  Service: General;  Laterality:  Right;    FAMILY HISTORY Family History  Problem Relation Age of Onset  . Mesothelioma Maternal Grandfather     asbestos exposure, died in his 44s  . Heart defect Sister 0  . Heart disease Paternal Aunt   . Heart disease  Paternal Uncle   . Breast cancer Paternal Grandmother   . Diabetes Paternal Grandmother   . Cancer Paternal Grandfather     NOS  . Colon cancer Other 49    MGMs brother with colon cancer  . Cancer Other     several of MGF's sisters with cancer NOS  . Liver cancer Other     MGF's brother   the patient's parents are living, her father being 39 and her mother 2 as of July 2016. The patient had no brothers. One sister died at age 53 from cardiac problems. The other sister is in good health. On the maternal side there is a history of colon cancer and an uncle age 17, liver cancer in a great uncle and mesothelioma in the maternal grandfather.  GYNECOLOGIC HISTORY:  No LMP recorded. Menarche age 51. The patient is GX P2. 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 The daughters are Susan Wilson and Susan Wilson, age 61 and 69. The patient attends a local [employer]: Not in place   HEALTH MAINTENANCE: Social History  Substance Use Topics  . Smoking status: Never Smoker   . Smokeless tobacco: Never Used  . Alcohol Use: Yes     Comment: rarely     Colonoscopy:  PAP: 2014  Bone density:  Lipid panel:  No Known Allergies  Current Outpatient Prescriptions  Medication Sig Dispense Refill  . HYDROcodone-acetaminophen (NORCO/VICODIN) 5-325 MG tablet Take 1 tablet by mouth every 6 (six) hours as needed for moderate pain. (Patient not taking: Reported on 02/26/2015) 20 tablet 0  . ibuprofen (ADVIL,MOTRIN) 200 MG tablet Take 400 mg by mouth every 6 (six) hours as needed (for pain).     Marland Kitchen lidocaine-prilocaine (EMLA) cream APPLY TO AFFTECTED AREA ONCE AS DIRECTED  0  . LORazepam (ATIVAN) 0.5 MG tablet Take 0.5 mg by mouth at bedtime.  0  . ondansetron (ZOFRAN) 8 MG tablet TAKE 1 TABLET BY MOUTH TWICE DAILY AS NEEDED. START ON 3RD DAY AFTER CHEMOTHERAPY  0    . prochlorperazine (COMPAZINE) 10 MG tablet Take 10 mg by mouth every 6 (six) hours as needed for nausea or vomiting.   0  . tobramycin-dexamethasone (TOBRADEX) ophthalmic solution Place 1 drop into both eyes every 4 (four) hours while awake. 5 mL 1   No current facility-administered medications for this visit.   Facility-Administered Medications Ordered in Other Visits  Medication Dose Route Frequency Provider Last Rate Last Dose  . sodium chloride 0.9 % injection 10 mL  10 mL Intracatheter PRN Chauncey Cruel, MD   10 mL at 03/19/15 1309    OBJECTIVE: Young African-American woman in no acute distress Filed Vitals:   03/19/15 0953  BP: 104/66  Pulse: 92  Temp: 98.2 F (36.8 C)  Resp: 17     Body mass index is 37.06 kg/(m^2).    ECOG FS:1 - Symptomatic but completely ambulatory  Skin: warm, dry  HEENT: sclerae anicteric, conjunctivae pink, oropharynx clear. No thrush or mucositis.  Lymph Nodes: No cervical or supraclavicular lymphadenopathy  Lungs: clear to auscultation bilaterally, no rales, wheezes, or rhonci  Heart: regular rate and rhythm  Abdomen: round, soft, non tender, positive bowel sounds  Musculoskeletal: No focal spinal tenderness, no peripheral edema  Neuro: non focal, well oriented, positive affect  Breasts: deferred  LAB RESULTS:  CMP     Component Value Date/Time   NA 136 03/19/2015 0922   NA 136 12/12/2014 1222   K 4.1 03/19/2015 0922   K 4.2 12/12/2014 1222   CL 102 12/12/2014 1222   CO2 27 03/19/2015 0922   CO2 25 12/12/2014 1222   GLUCOSE 126 03/19/2015 0922   GLUCOSE 98 12/12/2014 1222   BUN 10.6 03/19/2015 0922   BUN 14 12/12/2014 1222   CREATININE 0.7 03/19/2015 0922   CREATININE 0.69 12/12/2014 1222   CALCIUM 9.3 03/19/2015 0922   CALCIUM 9.3 12/12/2014 1222   PROT 6.6 03/19/2015 0922   ALBUMIN 3.7 03/19/2015 0922   AST 21 03/19/2015 0922   ALT 42 03/19/2015 0922   ALKPHOS 70 03/19/2015 0922   BILITOT 0.50 03/19/2015 0922   GFRNONAA  >60 12/12/2014 1222   GFRAA >60 12/12/2014 1222    INo results found for: SPEP, UPEP  Lab Results  Component Value Date   WBC 3.5* 03/19/2015   NEUTROABS 2.5 03/19/2015   HGB 9.0* 03/19/2015   HCT 28.3* 03/19/2015   MCV 80.9 03/19/2015   PLT 256 03/19/2015      Chemistry      Component Value Date/Time   NA 136 03/19/2015 0922   NA 136 12/12/2014 1222   K 4.1 03/19/2015 0922   K 4.2 12/12/2014 1222   CL 102 12/12/2014 1222   CO2 27 03/19/2015 0922   CO2 25 12/12/2014 1222   BUN 10.6 03/19/2015 0922   BUN 14 12/12/2014 1222   CREATININE 0.7 03/19/2015 0922   CREATININE 0.69 12/12/2014 1222      Component Value Date/Time   CALCIUM 9.3 03/19/2015 0922   CALCIUM 9.3 12/12/2014 1222   ALKPHOS 70 03/19/2015 0922   AST 21 03/19/2015 0922   ALT 42 03/19/2015 0922   BILITOT 0.50 03/19/2015 0922       No results found for: LABCA2  No components found for: LABCA125  No results for input(s): INR in the last 168 hours.  Urinalysis    Component Value Date/Time   LABSPEC 1.020 05/06/2009 1439   PHURINE 7.0 05/06/2009 1439   GLUCOSEU NEGATIVE 05/06/2009 1439   HGBUR NEGATIVE 05/06/2009 1439   BILIRUBINUR NEGATIVE 05/06/2009 1439   KETONESUR NEGATIVE 05/06/2009 1439   PROTEINUR NEGATIVE 05/06/2009 1439   UROBILINOGEN 0.2 05/06/2009 1439   NITRITE NEGATIVE 05/06/2009 1439   LEUKOCYTESUR  05/06/2009 1439    NEGATIVE Biochemical Testing Only. Please order routine urinalysis from main lab if confirmatory testing is needed.    STUDIES: No results found.  ASSESSMENT: 39 y.o. [place] woman s/p Right breast biopsy 11/21/2014 for a clinical T2 NX, stage 2 invasive ductal carcinoma, grade 2 or 3, estrogen and progesterone receptor negative, HER-2 equivocal, with an Mib-1 of 20%  (a) biopsy of a suspicious axillary lymph node same day was negative, but discordant  (b) review of 80 additional tumor cells by FISH still showed HER-2 not amplified; tumor should be treated as a  triple negative  (1) neoadjuvant chemotherapy started 01/01/2015 consisting of cyclophosphamide and doxorubicin in dose dense fashion 4, with onpro support, completed 02/13/2015, to be followed by paclitaxel/ carboplatin weekly 12   (2) genetics testing 12/15/2014 through the Breast/Ovarian gene panel offered by [facility] found no deleterious mutations  in ATM, BARD1, BRCA1, BRCA2, BRIP1, CDH1, CHEK2, EPCAM, FANCC, MLH1, MSH2, MSH6, NBN, PALB2, PMS2, PTEN, RAD51C, RAD51D, TP53, and XRCC2  (3) definitive surgery  to follow chemotherapy  (4) radiation to follow surgery  PLAN: Wing continues to perform well during treatment. The labs were reviewed in detail and were stable. She will proceed with cycle 4 of carboplatin and paclitaxel as planned today.   Graylee will return in 1 week for labs and cycle 5 of treatment. She understands and agrees with this plan. She knows the goal of treatment in her case is cure. She has been encouraged to call with any issues that might arise before her next visit here.   Mikey Bussing, NP   03/19/2015 1:41 PM

## 2015-03-19 NOTE — Patient Instructions (Signed)
[facility] Discharge Instructions for Patients Receiving Chemotherapy  Today you received the following chemotherapy agents: taxol, carboplatin  To help prevent nausea and vomiting after your treatment, we encourage you to take your nausea medication as prescribed.    If you develop nausea and vomiting that is not controlled by your nausea medication, call the clinic.   BELOW ARE SYMPTOMS THAT SHOULD BE REPORTED IMMEDIATELY:  *FEVER GREATER THAN 100.5 F  *CHILLS WITH OR WITHOUT FEVER  NAUSEA AND VOMITING THAT IS NOT CONTROLLED WITH YOUR NAUSEA MEDICATION  *UNUSUAL SHORTNESS OF BREATH  *UNUSUAL BRUISING OR BLEEDING  TENDERNESS IN MOUTH AND THROAT WITH OR WITHOUT PRESENCE OF ULCERS  *URINARY PROBLEMS  *BOWEL PROBLEMS  UNUSUAL RASH Items with * indicate a potential emergency and should be followed up as soon as possible.  Feel free to call the clinic you have any questions or concerns. The clinic phone number is [phone].  Please show the [facility] at check-in to the Emergency Department and triage nurse.

## 2015-03-20 ENCOUNTER — Ambulatory Visit: Payer: 59

## 2015-03-27 ENCOUNTER — Ambulatory Visit (HOSPITAL_BASED_OUTPATIENT_CLINIC_OR_DEPARTMENT_OTHER): Payer: 59 | Admitting: Oncology

## 2015-03-27 ENCOUNTER — Ambulatory Visit (HOSPITAL_BASED_OUTPATIENT_CLINIC_OR_DEPARTMENT_OTHER): Payer: 59

## 2015-03-27 ENCOUNTER — Other Ambulatory Visit (HOSPITAL_BASED_OUTPATIENT_CLINIC_OR_DEPARTMENT_OTHER): Payer: 59

## 2015-03-27 VITALS — BP 126/80 | HR 94 | Temp 98.2°F | Resp 18 | Ht 65.0 in | Wt 222.6 lb

## 2015-03-27 DIAGNOSIS — Z5111 Encounter for antineoplastic chemotherapy: Secondary | ICD-10-CM

## 2015-03-27 DIAGNOSIS — C50311 Malignant neoplasm of lower-inner quadrant of right female breast: Secondary | ICD-10-CM

## 2015-03-27 DIAGNOSIS — R110 Nausea: Secondary | ICD-10-CM | POA: Diagnosis not present

## 2015-03-27 DIAGNOSIS — R200 Anesthesia of skin: Secondary | ICD-10-CM

## 2015-03-27 DIAGNOSIS — D649 Anemia, unspecified: Secondary | ICD-10-CM | POA: Diagnosis not present

## 2015-03-27 DIAGNOSIS — Z171 Estrogen receptor negative status [ER-]: Secondary | ICD-10-CM

## 2015-03-27 LAB — CBC WITH DIFFERENTIAL/PLATELET
BASO%: 0.5 % (ref 0.0–2.0)
Basophils Absolute: 0 10*3/uL (ref 0.0–0.1)
EOS%: 0.3 % (ref 0.0–7.0)
Eosinophils Absolute: 0 10*3/uL (ref 0.0–0.5)
HCT: 28 % — ABNORMAL LOW (ref 34.8–46.6)
HGB: 8.9 g/dL — ABNORMAL LOW (ref 11.6–15.9)
LYMPH%: 20.9 % (ref 14.0–49.7)
MCH: 26.2 pg (ref 25.1–34.0)
MCHC: 31.9 g/dL (ref 31.5–36.0)
MCV: 82 fL (ref 79.5–101.0)
MONO#: 0.3 10*3/uL (ref 0.1–0.9)
MONO%: 7.3 % (ref 0.0–14.0)
NEUT#: 3.2 10*3/uL (ref 1.5–6.5)
NEUT%: 71 % (ref 38.4–76.8)
Platelets: 215 10*3/uL (ref 145–400)
RBC: 3.41 10*6/uL — ABNORMAL LOW (ref 3.70–5.45)
RDW: 20.5 % — ABNORMAL HIGH (ref 11.2–14.5)
WBC: 4.5 10*3/uL (ref 3.9–10.3)
lymph#: 0.9 10*3/uL (ref 0.9–3.3)

## 2015-03-27 LAB — COMPREHENSIVE METABOLIC PANEL (CC13)
ALBUMIN: 3.6 g/dL (ref 3.5–5.0)
ALK PHOS: 82 U/L (ref 40–150)
ALT: 40 U/L (ref 0–55)
ANION GAP: 7 meq/L (ref 3–11)
AST: 19 U/L (ref 5–34)
BILIRUBIN TOTAL: 0.39 mg/dL (ref 0.20–1.20)
BUN: 9.8 mg/dL (ref 7.0–26.0)
CO2: 26 mEq/L (ref 22–29)
CREATININE: 0.7 mg/dL (ref 0.6–1.1)
Calcium: 9.2 mg/dL (ref 8.4–10.4)
Chloride: 107 mEq/L (ref 98–109)
Glucose: 109 mg/dl (ref 70–140)
Potassium: 3.8 mEq/L (ref 3.5–5.1)
Sodium: 140 mEq/L (ref 136–145)
TOTAL PROTEIN: 6.6 g/dL (ref 6.4–8.3)

## 2015-03-27 MED ORDER — FAMOTIDINE IN NACL 20-0.9 MG/50ML-% IV SOLN
20.0000 mg | Freq: Once | INTRAVENOUS | Status: AC
  Administered 2015-03-27: 20 mg via INTRAVENOUS

## 2015-03-27 MED ORDER — SODIUM CHLORIDE 0.9 % IJ SOLN
10.0000 mL | INTRAMUSCULAR | Status: DC | PRN
  Administered 2015-03-27: 10 mL
  Filled 2015-03-27: qty 10

## 2015-03-27 MED ORDER — DIPHENHYDRAMINE HCL 50 MG/ML IJ SOLN
25.0000 mg | Freq: Once | INTRAMUSCULAR | Status: AC
  Administered 2015-03-27: 25 mg via INTRAVENOUS

## 2015-03-27 MED ORDER — CARBOPLATIN CHEMO INJECTION 450 MG/45ML
300.0000 mg | Freq: Once | INTRAVENOUS | Status: AC
  Administered 2015-03-27: 300 mg via INTRAVENOUS
  Filled 2015-03-27: qty 30

## 2015-03-27 MED ORDER — SODIUM CHLORIDE 0.9 % IV SOLN
Freq: Once | INTRAVENOUS | Status: AC
  Administered 2015-03-27: 10:00:00 via INTRAVENOUS

## 2015-03-27 MED ORDER — SODIUM CHLORIDE 0.9 % IV SOLN
Freq: Once | INTRAVENOUS | Status: AC
  Administered 2015-03-27: 10:00:00 via INTRAVENOUS
  Filled 2015-03-27: qty 8

## 2015-03-27 MED ORDER — FAMOTIDINE IN NACL 20-0.9 MG/50ML-% IV SOLN
INTRAVENOUS | Status: AC
  Filled 2015-03-27: qty 50

## 2015-03-27 MED ORDER — DIPHENHYDRAMINE HCL 50 MG/ML IJ SOLN
INTRAMUSCULAR | Status: AC
Start: 2015-03-27 — End: 2015-03-27
  Filled 2015-03-27: qty 1

## 2015-03-27 MED ORDER — DEXTROSE 5 % IV SOLN
80.0000 mg/m2 | Freq: Once | INTRAVENOUS | Status: AC
  Administered 2015-03-27: 174 mg via INTRAVENOUS
  Filled 2015-03-27: qty 29

## 2015-03-27 MED ORDER — HEPARIN SOD (PORK) LOCK FLUSH 100 UNIT/ML IV SOLN
500.0000 [IU] | Freq: Once | INTRAVENOUS | Status: AC | PRN
  Administered 2015-03-27: 500 [IU]
  Filled 2015-03-27: qty 5

## 2015-03-27 NOTE — Progress Notes (Signed)
[facility]  [phone] [phone]     ID: Susan Wilson DOB: 1975-09-03  MR#: [id number]  ACZ#:[id number]  Patient Care Team: Donnamae Jude, MD as PCP - General (Obstetrics and Gynecology) Rolm Bookbinder, MD as Consulting Physician (General Surgery) Chauncey Cruel, MD as Consulting Physician (Oncology) Thea Silversmith, MD as Consulting Physician (Radiation Oncology) [name], RN as Registered Nurse Rockwell Germany, RN as Registered Nurse Holley Bouche, NP as Nurse Practitioner (Nurse Practitioner) PCP: Donnamae Jude, MD OTHER MD:  CHIEF COMPLAINT: Estrogen receptor negative breast cancer  CURRENT TREATMENT: Neoadjuvant chemotherapy  BREAST CANCER HISTORY: From the original intake note:  Susan Wilson herself noted a mass in her right breast sometime around April. Initially she thought it might be related to menstruation, but as it did not change and eventually became tender, she brought it to her physician's attention. On 11/20/2014 patient underwent bilateral diagnostic mammography with tomosynthesis and right breast ultrasonography at the breast Center. The breast density was category B. There was a hyperdense mass in the right lower inner quadrant associated with skin thickening. There was also a 5 mm nodule posteriorly at the 8:30 o'clock position in the right breast. There were several hyperdense nodules in the right axilla. On physical exam there was a firm fixed mass in the right breast at the 5:00 position. By ultrasound this was lobulated and appear to involve the skin. It measures up to 4.1 cm. There was no sonographic correlation to the 5 mm nodule seen in a different area of the right breast. The right axilla showed 3 hypervascular lymph nodes with prominent cortical thickening, measuring less than 1.5 cm.  On 11/21/2014 the patient underwent right breast biopsy (5:00 mass) and biopsy of one of the suspicious right  axillary lymph nodes. 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  The patient's subsequent history is as detailed below  INTERVAL HISTORY: Susan Wilson returns today for follow up of her triple negative breast cancer, accompanied by her husband Antrell. Today is day 1 cycle 5 of 12 weekly planned doses of carboplatin and paclitaxel.   She is generally tolerating treatment well. She has nausea the first 2 days, but no vomiting. She is taking her nausea medicine very irregularly. Occasionally if she has nausea she takes the Compazine. She thinks she is doing this about twice a week. She also has a little bit of an upset stomach today of treatment. It helps that she eat something.   REVIEW OF SYSTEMS: She has a little bit of tingling and numbness in her fingertips, but this is very inconstant. Today for example right now there is absolutely no tingling or numbness. She has no problems on her toes at any point. She has had no change in bowel habits or bladder control. She does not have full periods but she has been spotting on a monthly basis right through the chemotherapy, most recently last week. She has not developed hot flashes or any other symptoms associated with menopause. She is on leave from work for the chemotherapy and surgery. She walks with the children but does not otherwise exercise regularly. A detailed review of systems today was otherwise stable.  PAST MEDICAL HISTORY: Past Medical History  Diagnosis Date  . Breast cancer of lower-inner  quadrant of right female breast ([employer]) 11/25/2014  . Breast cancer ([place])   . Hernia, umbilical   . Back pain   . Headache   . Anemia   . Hernia, umbilical   . Family history of breast cancer     PAST SURGICAL  HISTORY: Past Surgical History  Procedure Laterality Date  . Portacath placement Right 12/16/2014    Procedure: INSERTION PORT-A-CATH WITH ULTRASOUND;  Surgeon: Rolm Bookbinder, MD;  Location: [facility];  Service: General;  Laterality: Right;    FAMILY HISTORY Family History  Problem Relation Age of Onset  . Mesothelioma Maternal Grandfather     asbestos exposure, died in his 69s  . Heart defect Sister 0  . Heart disease Paternal Aunt   . Heart disease Paternal Uncle   . Breast cancer Paternal Grandmother   . Diabetes Paternal Grandmother   . Cancer Paternal Grandfather     NOS  . Colon cancer Other 67    MGMs brother with colon cancer  . Cancer Other     several of MGF's sisters with cancer NOS  . Liver cancer Other     MGF's brother   the patient's parents are living, her father being 68 and her mother 33 as of July 2016. The patient had no brothers. One sister died at age 32 from cardiac problems. The other sister is in good health. On the maternal side there is a history of colon cancer and an uncle age 13, liver cancer in a great uncle and mesothelioma in the maternal grandfather.  GYNECOLOGIC HISTORY:  No LMP recorded. Menarche age 50. The patient is GX P2. She still having regular periods  SOCIAL HISTORY:  Elfie works in Therapist, art for [employer]. Her husband Antrell works for [employer]. The daughters are [place] and Lovie Macadamia, age 39 and 95. The patient attends a local [place]: Not in place   HEALTH MAINTENANCE: Social History  Substance Use Topics  . Smoking status: Never Smoker   . Smokeless tobacco: Never Used  . Alcohol Use: Yes     Comment: rarely     Colonoscopy:  PAP: 2014  Bone density:  Lipid panel:  No Known Allergies  Current Outpatient Prescriptions  Medication Sig Dispense Refill  . HYDROcodone-acetaminophen (NORCO/VICODIN) 5-325 MG tablet Take 1 tablet by mouth every 6 (six) hours as  needed for moderate pain. (Patient not taking: Reported on 02/26/2015) 20 tablet 0  . ibuprofen (ADVIL,MOTRIN) 200 MG tablet Take 400 mg by mouth every 6 (six) hours as needed (for pain).     Marland Kitchen lidocaine-prilocaine (EMLA) cream APPLY TO AFFTECTED AREA ONCE AS DIRECTED  0  . LORazepam (ATIVAN) 0.5 MG tablet Take 0.5 mg by mouth at bedtime.  0  . ondansetron (ZOFRAN) 8 MG tablet TAKE 1 TABLET BY MOUTH TWICE DAILY AS NEEDED. START ON 3RD DAY AFTER CHEMOTHERAPY  0  . prochlorperazine (COMPAZINE) 10 MG tablet Take 10 mg by mouth every 6 (six) hours as needed for nausea or vomiting.   0  . tobramycin-dexamethasone (TOBRADEX) ophthalmic solution Place 1 drop into both eyes every 4 (four) hours while awake. 5 mL 1   No current facility-administered medications for this visit.    OBJECTIVE: Young African-American womanwho appears stated age  Danley Danker Vitals:   03/27/15 0827  BP: 126/80  Pulse: 94  Temp: 98.2 F (36.8 C)  Resp: 18     Body mass index is 37.04 kg/(m^2).    ECOG  FS:1 - Symptomatic but completely ambulatory  Sclerae unicteric, pupils round and equal Oropharynx clear and moist-- no thrush or other lesions No cervical or supraclavicular adenopathy Lungs no rales or rhonchi Heart regular rate and rhythm Abd soft, nontender, positive bowel sounds MSK no focal spinal tenderness, no upper extremity lymphedema Neuro: nonfocal, well oriented, appropriate affect Breasts: Deferred Skin: Hyperpigmentation at the base of the nails and on the tongue as well as less markedly over the dorsum of the hands   LAB RESULTS:  CMP     Component Value Date/Time   NA 136 03/19/2015 0922   NA 136 12/12/2014 1222   K 4.1 03/19/2015 0922   K 4.2 12/12/2014 1222   CL 102 12/12/2014 1222   CO2 27 03/19/2015 0922   CO2 25 12/12/2014 1222   GLUCOSE 126 03/19/2015 0922   GLUCOSE 98 12/12/2014 1222   BUN 10.6 03/19/2015 0922   BUN 14 12/12/2014 1222   CREATININE 0.7 03/19/2015 0922   CREATININE  0.69 12/12/2014 1222   CALCIUM 9.3 03/19/2015 0922   CALCIUM 9.3 12/12/2014 1222   PROT 6.6 03/19/2015 0922   ALBUMIN 3.7 03/19/2015 0922   AST 21 03/19/2015 0922   ALT 42 03/19/2015 0922   ALKPHOS 70 03/19/2015 0922   BILITOT 0.50 03/19/2015 0922   GFRNONAA >60 12/12/2014 1222   GFRAA >60 12/12/2014 1222    INo results found for: SPEP, UPEP  Lab Results  Component Value Date   WBC 4.5 03/27/2015   NEUTROABS 3.2 03/27/2015   HGB 8.9* 03/27/2015   HCT 28.0* 03/27/2015   MCV 82.0 03/27/2015   PLT 215 03/27/2015      Chemistry      Component Value Date/Time   NA 136 03/19/2015 0922   NA 136 12/12/2014 1222   K 4.1 03/19/2015 0922   K 4.2 12/12/2014 1222   CL 102 12/12/2014 1222   CO2 27 03/19/2015 0922   CO2 25 12/12/2014 1222   BUN 10.6 03/19/2015 0922   BUN 14 12/12/2014 1222   CREATININE 0.7 03/19/2015 0922   CREATININE 0.69 12/12/2014 1222      Component Value Date/Time   CALCIUM 9.3 03/19/2015 0922   CALCIUM 9.3 12/12/2014 1222   ALKPHOS 70 03/19/2015 0922   AST 21 03/19/2015 0922   ALT 42 03/19/2015 0922   BILITOT 0.50 03/19/2015 0922       No results found for: LABCA2  No components found for: LABCA125  No results for input(s): INR in the last 168 hours.  Urinalysis    Component Value Date/Time   LABSPEC 1.020 05/06/2009 1439   PHURINE 7.0 05/06/2009 1439   GLUCOSEU NEGATIVE 05/06/2009 1439   HGBUR NEGATIVE 05/06/2009 1439   BILIRUBINUR NEGATIVE 05/06/2009 1439   KETONESUR NEGATIVE 05/06/2009 1439   PROTEINUR NEGATIVE 05/06/2009 1439   UROBILINOGEN 0.2 05/06/2009 1439   NITRITE NEGATIVE 05/06/2009 1439   LEUKOCYTESUR  05/06/2009 1439    NEGATIVE Biochemical Testing Only. Please order routine urinalysis from main lab if confirmatory testing is needed.    STUDIES: No results found.  ASSESSMENT: 39 y.o. [place] woman s/p Right breast biopsy 11/21/2014 for a clinical T2 NX, stage 2 invasive ductal carcinoma, grade 2 or 3, estrogen and  progesterone receptor negative, HER-2 equivocal, with an Mib-1 of 20%  (a) biopsy of a suspicious axillary lymph node same day was negative, but discordant  (b) review of 80 additional tumor cells by FISH still showed HER-2 not amplified; tumor should be treated as  a triple negative  (1) neoadjuvant chemotherapy started 01/01/2015 consisting of cyclophosphamide and doxorubicin in dose dense fashion 4, with onpro support, completed 02/13/2015, followed by paclitaxel/ carboplatin weekly 12 started 02/26/2015   (2) genetics testing 12/15/2014 through the Breast/Ovarian gene panel offered by [facility] found no deleterious mutations in ATM, BARD1, BRCA1, BRCA2, BRIP1, CDH1, CHEK2, EPCAM, FANCC, MLH1, MSH2, MSH6, NBN, PALB2, PMS2, PTEN, RAD51C, RAD51D, TP53, and XRCC2  (3) definitive surgery  to follow chemotherapy  (4) radiation to follow surgery  PLAN: Susan Wilson Is tolerating her current chemotherapy well and we are proceeding to cycle 5 today.  She is having a little bit of nausea the first 2 days. I suggested she take the Compazine 4 times a day and that should take care of it.  She has some numbness and tingling but this is very inconstant. We're going to follow this closely.  She is becoming a bit more anemic. At this point however there is no indication for transfusion. If necessary we could give her a week "off" at some point and see if that helps bring the hemoglobin up a bit.  She already has an appointment for next week. She knows to call for any problems that may develop before that visit.  Chauncey Cruel, MD   03/27/2015 8:45 AM

## 2015-03-27 NOTE — Patient Instructions (Signed)
[facility] Discharge Instructions for Patients Receiving Chemotherapy  Today you received the following chemotherapy agents Taxol/Carboplatin  To help prevent nausea and vomiting after your treatment, we encourage you to take your nausea medication    If you develop nausea and vomiting that is not controlled by your nausea medication, call the clinic.   BELOW ARE SYMPTOMS THAT SHOULD BE REPORTED IMMEDIATELY:  *FEVER GREATER THAN 100.5 F  *CHILLS WITH OR WITHOUT FEVER  NAUSEA AND VOMITING THAT IS NOT CONTROLLED WITH YOUR NAUSEA MEDICATION  *UNUSUAL SHORTNESS OF BREATH  *UNUSUAL BRUISING OR BLEEDING  TENDERNESS IN MOUTH AND THROAT WITH OR WITHOUT PRESENCE OF ULCERS  *URINARY PROBLEMS  *BOWEL PROBLEMS  UNUSUAL RASH Items with * indicate a potential emergency and should be followed up as soon as possible.  Feel free to call the clinic you have any questions or concerns. The clinic phone number is [phone].  Please show the CHEMO ALERT CARD at check-in to the Emergency Department and triage nurse.   

## 2015-03-31 ENCOUNTER — Other Ambulatory Visit: Payer: Self-pay | Admitting: *Deleted

## 2015-03-31 MED ORDER — PROCHLORPERAZINE MALEATE 10 MG PO TABS: 10.0000 mg | ORAL_TABLET | Freq: Four times a day (QID) | ORAL | Status: DC | PRN

## 2015-03-31 MED ORDER — LORAZEPAM 0.5 MG PO TABS: 0.5000 mg | ORAL_TABLET | Freq: Every day | ORAL | Status: DC

## 2015-03-31 MED ORDER — LIDOCAINE-PRILOCAINE 2.5-2.5 % EX CREA: TOPICAL_CREAM | CUTANEOUS | Status: DC | PRN

## 2015-04-02 ENCOUNTER — Other Ambulatory Visit (HOSPITAL_BASED_OUTPATIENT_CLINIC_OR_DEPARTMENT_OTHER): Payer: 59

## 2015-04-02 ENCOUNTER — Encounter: Payer: Self-pay | Admitting: Physician Assistant

## 2015-04-02 ENCOUNTER — Ambulatory Visit (HOSPITAL_BASED_OUTPATIENT_CLINIC_OR_DEPARTMENT_OTHER): Payer: 59

## 2015-04-02 ENCOUNTER — Ambulatory Visit (HOSPITAL_BASED_OUTPATIENT_CLINIC_OR_DEPARTMENT_OTHER): Payer: 59 | Admitting: Physician Assistant

## 2015-04-02 VITALS — BP 123/80 | HR 108 | Temp 98.6°F | Resp 18 | Ht 65.0 in | Wt 219.2 lb

## 2015-04-02 VITALS — Resp 99

## 2015-04-02 DIAGNOSIS — C50311 Malignant neoplasm of lower-inner quadrant of right female breast: Secondary | ICD-10-CM | POA: Diagnosis not present

## 2015-04-02 DIAGNOSIS — Z171 Estrogen receptor negative status [ER-]: Secondary | ICD-10-CM

## 2015-04-02 DIAGNOSIS — Z5111 Encounter for antineoplastic chemotherapy: Secondary | ICD-10-CM | POA: Diagnosis not present

## 2015-04-02 DIAGNOSIS — D630 Anemia in neoplastic disease: Secondary | ICD-10-CM | POA: Insufficient documentation

## 2015-04-02 LAB — CBC WITH DIFFERENTIAL/PLATELET
BASO%: 1.2 % (ref 0.0–2.0)
BASOS ABS: 0 10*3/uL (ref 0.0–0.1)
EOS ABS: 0 10*3/uL (ref 0.0–0.5)
EOS%: 0.8 % (ref 0.0–7.0)
HCT: 28.9 % — ABNORMAL LOW (ref 34.8–46.6)
HGB: 9.1 g/dL — ABNORMAL LOW (ref 11.6–15.9)
LYMPH%: 20.9 % (ref 14.0–49.7)
MCH: 26 pg (ref 25.1–34.0)
MCHC: 31.4 g/dL — AB (ref 31.5–36.0)
MCV: 82.6 fL (ref 79.5–101.0)
MONO#: 0.2 10*3/uL (ref 0.1–0.9)
MONO%: 3.9 % (ref 0.0–14.0)
NEUT#: 2.9 10*3/uL (ref 1.5–6.5)
NEUT%: 73.2 % (ref 38.4–76.8)
Platelets: 216 10*3/uL (ref 145–400)
RBC: 3.5 10*6/uL — AB (ref 3.70–5.45)
RDW: 20.7 % — ABNORMAL HIGH (ref 11.2–14.5)
WBC: 3.9 10*3/uL (ref 3.9–10.3)
lymph#: 0.8 10*3/uL — ABNORMAL LOW (ref 0.9–3.3)

## 2015-04-02 LAB — COMPREHENSIVE METABOLIC PANEL (CC13)
ALBUMIN: 3.9 g/dL (ref 3.5–5.0)
ALK PHOS: 74 U/L (ref 40–150)
ALT: 40 U/L (ref 0–55)
AST: 24 U/L (ref 5–34)
Anion Gap: 7 mEq/L (ref 3–11)
BUN: 11.7 mg/dL (ref 7.0–26.0)
CHLORIDE: 106 meq/L (ref 98–109)
CO2: 25 meq/L (ref 22–29)
Calcium: 9.3 mg/dL (ref 8.4–10.4)
Creatinine: 0.8 mg/dL (ref 0.6–1.1)
GLUCOSE: 89 mg/dL (ref 70–140)
POTASSIUM: 4.3 meq/L (ref 3.5–5.1)
SODIUM: 139 meq/L (ref 136–145)
Total Bilirubin: 0.6 mg/dL (ref 0.20–1.20)
Total Protein: 7.1 g/dL (ref 6.4–8.3)

## 2015-04-02 MED ORDER — SODIUM CHLORIDE 0.9 % IJ SOLN
10.0000 mL | INTRAMUSCULAR | Status: DC | PRN
  Administered 2015-04-02: 10 mL
  Filled 2015-04-02: qty 10

## 2015-04-02 MED ORDER — SODIUM CHLORIDE 0.9 % IV SOLN
Freq: Once | INTRAVENOUS | Status: AC
  Administered 2015-04-02: 10:00:00 via INTRAVENOUS

## 2015-04-02 MED ORDER — SODIUM CHLORIDE 0.9 % IV SOLN
300.0000 mg | Freq: Once | INTRAVENOUS | Status: AC
  Administered 2015-04-02: 300 mg via INTRAVENOUS
  Filled 2015-04-02: qty 30

## 2015-04-02 MED ORDER — SODIUM CHLORIDE 0.9 % IV SOLN
Freq: Once | INTRAVENOUS | Status: AC
  Administered 2015-04-02: 10:00:00 via INTRAVENOUS
  Filled 2015-04-02: qty 8

## 2015-04-02 MED ORDER — FAMOTIDINE IN NACL 20-0.9 MG/50ML-% IV SOLN
20.0000 mg | Freq: Once | INTRAVENOUS | Status: AC
  Administered 2015-04-02: 20 mg via INTRAVENOUS

## 2015-04-02 MED ORDER — DIPHENHYDRAMINE HCL 50 MG/ML IJ SOLN
25.0000 mg | Freq: Once | INTRAMUSCULAR | Status: AC
  Administered 2015-04-02: 25 mg via INTRAVENOUS

## 2015-04-02 MED ORDER — DIPHENHYDRAMINE HCL 50 MG/ML IJ SOLN
INTRAMUSCULAR | Status: AC
  Filled 2015-04-02: qty 1

## 2015-04-02 MED ORDER — HEPARIN SOD (PORK) LOCK FLUSH 100 UNIT/ML IV SOLN
500.0000 [IU] | Freq: Once | INTRAVENOUS | Status: AC | PRN
  Administered 2015-04-02: 500 [IU]
  Filled 2015-04-02: qty 5

## 2015-04-02 MED ORDER — FAMOTIDINE IN NACL 20-0.9 MG/50ML-% IV SOLN
INTRAVENOUS | Status: AC
  Filled 2015-04-02: qty 50

## 2015-04-02 MED ORDER — PACLITAXEL CHEMO INJECTION 300 MG/50ML
80.0000 mg/m2 | Freq: Once | INTRAVENOUS | Status: AC
  Administered 2015-04-02: 174 mg via INTRAVENOUS
  Filled 2015-04-02: qty 29

## 2015-04-02 NOTE — Patient Instructions (Signed)
[facility] Discharge Instructions for Patients Receiving Chemotherapy  Today you received the following chemotherapy agents Taxol and Carboplatin. To help prevent nausea and vomiting after your treatment, we encourage you to take your nausea medication as directed.  If you develop nausea and vomiting that is not controlled by your nausea medication, call the clinic.   BELOW ARE SYMPTOMS THAT SHOULD BE REPORTED IMMEDIATELY:  *FEVER GREATER THAN 100.5 F  *CHILLS WITH OR WITHOUT FEVER  NAUSEA AND VOMITING THAT IS NOT CONTROLLED WITH YOUR NAUSEA MEDICATION  *UNUSUAL SHORTNESS OF BREATH  *UNUSUAL BRUISING OR BLEEDING  TENDERNESS IN MOUTH AND THROAT WITH OR WITHOUT PRESENCE OF ULCERS  *URINARY PROBLEMS  *BOWEL PROBLEMS  UNUSUAL RASH Items with * indicate a potential emergency and should be followed up as soon as possible.  Feel free to call the clinic you have any questions or concerns. The clinic phone number is [phone].  Please show the CHEMO ALERT CARD at check-in to the Emergency Department and triage nurse.    

## 2015-04-02 NOTE — Progress Notes (Signed)
[place]  [phone] [phone]     ID: Susan Wilson DOB: 1975/05/17  MR#: [id number]  SPQ#:[id number]  Patient Care Team: Donnamae Jude, MD as PCP - General (Obstetrics and Gynecology) Rolm Bookbinder, MD as Consulting Physician (General Surgery) Chauncey Cruel, MD as Consulting Physician (Oncology) Thea Silversmith, MD as Consulting Physician (Radiation Oncology) [name], RN as Registered Nurse Rockwell Germany, RN as Registered Nurse Holley Bouche, NP as Nurse Practitioner (Nurse Practitioner) PCP: Donnamae Jude, MD OTHER MD:  CHIEF COMPLAINT: Estrogen receptor negative breast cancer  CURRENT TREATMENT: Neoadjuvant chemotherapy  BREAST CANCER HISTORY: From the original intake note:  Susan Wilson herself noted a mass in her right breast sometime around April. Initially she thought it might be related to menstruation, but as it did not change and eventually became tender, she brought it to her physician's attention. On 11/20/2014 patient underwent bilateral diagnostic mammography with tomosynthesis and right breast ultrasonography at the breast Center. The breast density was category B. There was a hyperdense mass in the right lower inner quadrant associated with skin thickening. There was also a 5 mm nodule posteriorly at the 8:30 o'clock position in the right breast. There were several hyperdense nodules in the right axilla. On physical exam there was a firm fixed mass in the right breast at the 5:00 position. By ultrasound this was lobulated and appear to involve the skin. It measures up to 4.1 cm. There was no sonographic correlation to the 5 mm nodule seen in a different area of the right breast. The right axilla showed 3 hypervascular lymph nodes with prominent cortical thickening, measuring less than 1.5 cm.  On 11/21/2014 the patient underwent right breast biopsy (5:00 mass) and biopsy of one of the suspicious right  axillary lymph nodes. 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  The patient's subsequent history is as detailed below  INTERVAL HISTORY: Susan Wilson returns today for follow up of her triple negative breast cancer.Today is day 1 cycle 6 of 12 weekly planned doses of carboplatin and paclitaxel.   She is generally tolerating treatment well. She has occasional nausea without vomiting, for which she takes nausea medicine with improvement of symptoms. She thinks she is doing this about twice a week. She also has a little bit of an upset stomach today of treatment. It helps that she eat something.   REVIEW OF SYSTEMS: She reports occasional tingling in her digits, but this is not frequent. This does not limit her activities of daily living. She has no problems on her toes at any point. She has had no change in bowel habits or bladder control. She does not have full periods but she has been spotting on a monthly basis right through the chemotherapy, most recently last week. She has not developed hot flashes or any other symptoms associated with menopause. She is on leave from work for the chemotherapy and surgery. She walks with the children but does not otherwise exercise regularly. A detailed review of systems today was otherwise stable.  PAST MEDICAL HISTORY: Past Medical History  Diagnosis Date  . Breast cancer of lower-inner quadrant of right female breast ([facility]) 11/25/2014  . Breast cancer ([facility])   . Hernia, umbilical   . Back pain   .  Headache   . Anemia   . Hernia, umbilical   . Family history of breast cancer     PAST SURGICAL HISTORY: Past Surgical History  Procedure Laterality Date  . Portacath placement Right 12/16/2014    Procedure: INSERTION PORT-A-CATH WITH  ULTRASOUND;  Surgeon: Rolm Bookbinder, MD;  Location: [facility];  Service: General;  Laterality: Right;    FAMILY HISTORY Family History  Problem Relation Age of Onset  . Mesothelioma Maternal Grandfather     asbestos exposure, died in his 27s  . Heart defect Sister 0  . Heart disease Paternal Aunt   . Heart disease Paternal Uncle   . Breast cancer Paternal Grandmother   . Diabetes Paternal Grandmother   . Cancer Paternal Grandfather     NOS  . Colon cancer Other 45    MGMs brother with colon cancer  . Cancer Other     several of MGF's sisters with cancer NOS  . Liver cancer Other     MGF's brother   the patient's parents are living, her father being 70 and her mother 50 as of July 2016. The patient had no brothers. One sister died at age 59 from cardiac problems. The other sister is in good health. On the maternal side there is a history of colon cancer and an uncle age 72, liver cancer in a great uncle and mesothelioma in the maternal grandfather.  GYNECOLOGIC HISTORY:  No LMP recorded. Menarche age 4. The patient is GX P2. 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 The daughters are [place] and Lovie Macadamia, age 49 and 55. The patient attends a local [employer]: Not in place   HEALTH MAINTENANCE: Social History  Substance Use Topics  . Smoking status: Never Smoker   . Smokeless tobacco: Never Used  . Alcohol Use: Yes     Comment: rarely     Colonoscopy:  PAP: 2014  Bone density:  Lipid panel:  No Known Allergies  Current Outpatient Prescriptions  Medication Sig Dispense Refill  . HYDROcodone-acetaminophen (NORCO/VICODIN) 5-325 MG tablet Take 1 tablet by mouth every 6 (six) hours as needed for moderate pain. (Patient not taking: Reported on 02/26/2015) 20 tablet 0  . ibuprofen (ADVIL,MOTRIN) 200 MG tablet Take 400 mg by  mouth every 6 (six) hours as needed (for pain).     Marland Kitchen lidocaine-prilocaine (EMLA) cream Apply topically as needed. 30 g 1  . LORazepam (ATIVAN) 0.5 MG tablet Take 1 tablet (0.5 mg total) by mouth at bedtime. 30 tablet 0  . ondansetron (ZOFRAN) 8 MG tablet TAKE 1 TABLET BY MOUTH TWICE DAILY AS NEEDED. START ON 3RD DAY AFTER CHEMOTHERAPY  0  . prochlorperazine (COMPAZINE) 10 MG tablet Take 1 tablet (10 mg total) by mouth every 6 (six) hours as needed for nausea or vomiting. 30 tablet 1  . tobramycin-dexamethasone (TOBRADEX) ophthalmic solution Place 1 drop into both eyes every 4 (four) hours while awake. 5 mL 1   No current facility-administered medications for this visit.   Facility-Administered Medications Ordered in Other Visits  Medication Dose Route Frequency Provider Last Rate Last Dose  . CARBOplatin (PARAPLATIN) 300 mg in sodium chloride 0.9 % 100 mL chemo infusion  300 mg Intravenous Once Chauncey Cruel, MD      . famotidine (PEPCID) IVPB 20 mg premix  20 mg Intravenous Once Chauncey Cruel, MD      .  heparin lock flush 100 unit/mL  500 Units Intracatheter Once PRN Chauncey Cruel, MD      . ondansetron (ZOFRAN) 16 mg, dexamethasone (DECADRON) 10 mg in sodium chloride 0.9 % 50 mL IVPB   Intravenous Once Chauncey Cruel, MD      . PACLitaxel (TAXOL) 174 mg in dextrose 5 % 250 mL chemo infusion (</= 90m/m2)  80 mg/m2 (Treatment Plan Actual) Intravenous Once GChauncey Cruel MD      . sodium chloride 0.9 % injection 10 mL  10 mL Intracatheter PRN GChauncey Cruel MD        OBJECTIVE: Young African-American womanwho appears stated age  F55Vitals:   04/02/15 0906  BP: 123/80  Pulse: 108  Temp: 98.6 F (37 C)  Resp: 18     Body mass index is 36.48 kg/(m^2).    ECOG FS:1 - Symptomatic but completely ambulatory  Sclerae unicteric, pupils round and equal Oropharynx clear and moist-- no thrush or other lesions No cervical or supraclavicular adenopathy Lungs no rales or  rhonchi Heart regular rate and rhythm Abd soft, nontender, positive bowel sounds MSK no focal spinal tenderness, no upper extremity lymphedema Neuro: nonfocal, well oriented, appropriate affect Breasts: Deferred Skin: Hyperpigmentation at the base of the nails and on the tongue as well as less markedly over the dorsum of the hands   LAB RESULTS:  CMP  CBC Latest Ref Rng 04/02/2015 03/27/2015 03/19/2015  WBC 3.9 - 10.3 10e3/uL 3.9 4.5 3.5(L)  Hemoglobin 11.6 - 15.9 g/dL 9.1(L) 8.9(L) 9.0(L)  Hematocrit 34.8 - 46.6 % 28.9(L) 28.0(L) 28.3(L)  Platelets 145 - 400 10e3/uL 216 215 256   CMP Latest Ref Rng 04/02/2015 03/27/2015 03/19/2015  Glucose 70 - 140 mg/dl 89 109 126  BUN 7.0 - 26.0 mg/dL 11.7 9.8 10.6  Creatinine 0.6 - 1.1 mg/dL 0.8 0.7 0.7  Sodium 136 - 145 mEq/L 139 140 136  Potassium 3.5 - 5.1 mEq/L 4.3 3.8 4.1  Chloride 101 - 111 mmol/L - - -  CO2 22 - 29 mEq/L 25 26 27   Calcium 8.4 - 10.4 mg/dL 9.3 9.2 9.3  Total Protein 6.4 - 8.3 g/dL 7.1 6.6 6.6  Total Bilirubin 0.20 - 1.20 mg/dL 0.60 0.39 0.50  Alkaline Phos 40 - 150 U/L 74 82 70  AST 5 - 34 U/L 24 19 21   ALT 0 - 55 U/L 40 40 42      Urinalysis    Component Value Date/Time   LABSPEC 1.020 05/06/2009 1439   PHURINE 7.0 05/06/2009 1439   GLUCOSEU NEGATIVE 05/06/2009 1439   HGBUR NEGATIVE 05/06/2009 1439   BILIRUBINUR NEGATIVE 05/06/2009 1439   KETONESUR NEGATIVE 05/06/2009 1439   PROTEINUR NEGATIVE 05/06/2009 1439   UROBILINOGEN 0.2 05/06/2009 1439   NITRITE NEGATIVE 05/06/2009 1439   LEUKOCYTESUR  05/06/2009 1439    NEGATIVE Biochemical Testing Only. Please order routine urinalysis from main lab if confirmatory testing is needed.    STUDIES: No results found.  ASSESSMENT: 39y.o. [place] woman s/p Right breast biopsy 11/21/2014 for a clinical T2 NX, stage 2 invasive ductal carcinoma, grade 2 or 3, estrogen and progesterone receptor negative, HER-2 equivocal, with an Mib-1 of 20%  (a) biopsy of a  suspicious axillary lymph node same day was negative, but discordant  (b) review of 80 additional tumor cells by FISH still showed HER-2 not amplified; tumor should be treated as a triple negative  (1) neoadjuvant chemotherapy started 01/01/2015 consisting of cyclophosphamide and doxorubicin in dose dense  fashion 4, with onpro support, completed 02/13/2015, followed by paclitaxel/ carboplatin weekly 12 started 02/26/2015   (2) genetics testing 12/15/2014 through the Breast/Ovarian gene panel offered by [facility] found no deleterious mutations in ATM, BARD1, BRCA1, BRCA2, BRIP1, CDH1, CHEK2, EPCAM, FANCC, MLH1, MSH2, MSH6, NBN, PALB2, PMS2, PTEN, RAD51C, RAD51D, TP53, and XRCC2  (3) definitive surgery  to follow chemotherapy  (4) radiation to follow surgery  PLAN: Susan Wilson Is tolerating her current chemotherapy well and we are proceeding to cycle 6 today. Visit anemia has slightly improved, and she is feeling less symptomatic .At this point however there is no indication for transfusion. If necessary we could give her a week "off" at some point and see if that helps bring the hemoglobin up a bit. She is eager to proceed with cycle 6, day 1 of chemotherapy with carboplatin and Taxol and every 7 days. She already has an appointment for next week. She knows to call for any problems that may develop before that visit.  Rondel Jumbo, PA-C   04/02/2015 10:22 AM

## 2015-04-03 ENCOUNTER — Ambulatory Visit: Payer: 59

## 2015-04-06 ENCOUNTER — Telehealth: Payer: Self-pay | Admitting: *Deleted

## 2015-04-06 NOTE — Telephone Encounter (Signed)
Received call from patient stating she thinks she has caught a cold from her kids and is asking what she can take.  Denies fever at this time.  She states she a little yellow drainage.  Informed Heather, NP.  Patient instructed to take Mucinex and drink plenty of water and to let us know if she develops fever.  Patient verbalized understanding.

## 2015-04-07 ENCOUNTER — Other Ambulatory Visit: Payer: Self-pay | Admitting: Oncology

## 2015-04-08 ENCOUNTER — Encounter: Payer: Self-pay | Admitting: Nurse Practitioner

## 2015-04-08 ENCOUNTER — Other Ambulatory Visit (HOSPITAL_BASED_OUTPATIENT_CLINIC_OR_DEPARTMENT_OTHER): Payer: 59

## 2015-04-08 ENCOUNTER — Ambulatory Visit (HOSPITAL_BASED_OUTPATIENT_CLINIC_OR_DEPARTMENT_OTHER): Payer: 59

## 2015-04-08 ENCOUNTER — Ambulatory Visit (HOSPITAL_BASED_OUTPATIENT_CLINIC_OR_DEPARTMENT_OTHER): Payer: 59 | Admitting: Nurse Practitioner

## 2015-04-08 VITALS — BP 116/84 | HR 93 | Temp 98.3°F | Resp 18 | Wt 218.2 lb

## 2015-04-08 DIAGNOSIS — R200 Anesthesia of skin: Secondary | ICD-10-CM

## 2015-04-08 DIAGNOSIS — G620 Drug-induced polyneuropathy: Secondary | ICD-10-CM

## 2015-04-08 DIAGNOSIS — C50311 Malignant neoplasm of lower-inner quadrant of right female breast: Secondary | ICD-10-CM

## 2015-04-08 DIAGNOSIS — Z171 Estrogen receptor negative status [ER-]: Secondary | ICD-10-CM

## 2015-04-08 DIAGNOSIS — Z5111 Encounter for antineoplastic chemotherapy: Secondary | ICD-10-CM | POA: Diagnosis not present

## 2015-04-08 DIAGNOSIS — R202 Paresthesia of skin: Secondary | ICD-10-CM | POA: Diagnosis not present

## 2015-04-08 DIAGNOSIS — T451X5A Adverse effect of antineoplastic and immunosuppressive drugs, initial encounter: Secondary | ICD-10-CM

## 2015-04-08 LAB — CBC WITH DIFFERENTIAL/PLATELET
BASO%: 0.7 % (ref 0.0–2.0)
BASOS ABS: 0 10*3/uL (ref 0.0–0.1)
EOS ABS: 0 10*3/uL (ref 0.0–0.5)
EOS%: 0.7 % (ref 0.0–7.0)
HEMATOCRIT: 28.7 % — AB (ref 34.8–46.6)
HEMOGLOBIN: 9 g/dL — AB (ref 11.6–15.9)
LYMPH#: 0.8 10*3/uL — AB (ref 0.9–3.3)
LYMPH%: 31.5 % (ref 14.0–49.7)
MCH: 26.4 pg (ref 25.1–34.0)
MCHC: 31.5 g/dL (ref 31.5–36.0)
MCV: 83.8 fL (ref 79.5–101.0)
MONO#: 0.1 10*3/uL (ref 0.1–0.9)
MONO%: 5.3 % (ref 0.0–14.0)
NEUT#: 1.7 10*3/uL (ref 1.5–6.5)
NEUT%: 61.8 % (ref 38.4–76.8)
PLATELETS: 284 10*3/uL (ref 145–400)
RBC: 3.43 10*6/uL — ABNORMAL LOW (ref 3.70–5.45)
RDW: 21 % — AB (ref 11.2–14.5)
WBC: 2.7 10*3/uL — ABNORMAL LOW (ref 3.9–10.3)

## 2015-04-08 LAB — COMPREHENSIVE METABOLIC PANEL (CC13)
ALBUMIN: 4 g/dL (ref 3.5–5.0)
ALK PHOS: 74 U/L (ref 40–150)
ALT: 47 U/L (ref 0–55)
AST: 32 U/L (ref 5–34)
Anion Gap: 7 mEq/L (ref 3–11)
BILIRUBIN TOTAL: 0.6 mg/dL (ref 0.20–1.20)
BUN: 7.3 mg/dL (ref 7.0–26.0)
CALCIUM: 9.4 mg/dL (ref 8.4–10.4)
CO2: 26 mEq/L (ref 22–29)
Chloride: 105 mEq/L (ref 98–109)
Creatinine: 0.8 mg/dL (ref 0.6–1.1)
GLUCOSE: 125 mg/dL (ref 70–140)
POTASSIUM: 3.9 meq/L (ref 3.5–5.1)
Sodium: 137 mEq/L (ref 136–145)
TOTAL PROTEIN: 7.1 g/dL (ref 6.4–8.3)

## 2015-04-08 MED ORDER — DIPHENHYDRAMINE HCL 50 MG/ML IJ SOLN
INTRAMUSCULAR | Status: AC
  Filled 2015-04-08: qty 1

## 2015-04-08 MED ORDER — SODIUM CHLORIDE 0.9 % IV SOLN
Freq: Once | INTRAVENOUS | Status: AC
  Administered 2015-04-08: 11:00:00 via INTRAVENOUS

## 2015-04-08 MED ORDER — SODIUM CHLORIDE 0.9 % IV SOLN
300.0000 mg | Freq: Once | INTRAVENOUS | Status: AC
  Administered 2015-04-08: 300 mg via INTRAVENOUS
  Filled 2015-04-08: qty 30

## 2015-04-08 MED ORDER — SODIUM CHLORIDE 0.9 % IV SOLN
Freq: Once | INTRAVENOUS | Status: AC
  Administered 2015-04-08: 12:00:00 via INTRAVENOUS
  Filled 2015-04-08: qty 8

## 2015-04-08 MED ORDER — SODIUM CHLORIDE 0.9 % IJ SOLN
10.0000 mL | INTRAMUSCULAR | Status: DC | PRN
  Administered 2015-04-08: 10 mL
  Filled 2015-04-08: qty 10

## 2015-04-08 MED ORDER — HEPARIN SOD (PORK) LOCK FLUSH 100 UNIT/ML IV SOLN
500.0000 [IU] | Freq: Once | INTRAVENOUS | Status: AC | PRN
  Administered 2015-04-08: 500 [IU]
  Filled 2015-04-08: qty 5

## 2015-04-08 MED ORDER — DIPHENHYDRAMINE HCL 50 MG/ML IJ SOLN
25.0000 mg | Freq: Once | INTRAMUSCULAR | Status: AC
  Administered 2015-04-08: 25 mg via INTRAVENOUS

## 2015-04-08 MED ORDER — FAMOTIDINE IN NACL 20-0.9 MG/50ML-% IV SOLN
INTRAVENOUS | Status: AC
  Filled 2015-04-08: qty 50

## 2015-04-08 MED ORDER — FAMOTIDINE IN NACL 20-0.9 MG/50ML-% IV SOLN
20.0000 mg | Freq: Once | INTRAVENOUS | Status: AC
  Administered 2015-04-08: 20 mg via INTRAVENOUS

## 2015-04-08 MED ORDER — PACLITAXEL CHEMO INJECTION 300 MG/50ML
80.0000 mg/m2 | Freq: Once | INTRAVENOUS | Status: AC
  Administered 2015-04-08: 174 mg via INTRAVENOUS
  Filled 2015-04-08: qty 29

## 2015-04-08 NOTE — Progress Notes (Signed)
[place]  Telephone:(336) [phone] [phone]) [phone]     ID: Susan Wilson DOB: Sep 24, 1975  MR#: [id number]  JYN#:[id number]  Patient Care Team: Donnamae Jude, MD as PCP - General (Obstetrics and Gynecology) Rolm Bookbinder, MD as Consulting Physician (General Surgery) Chauncey Cruel, MD as Consulting Physician (Oncology) Thea Silversmith, MD as Consulting Physician (Radiation Oncology) [name], RN as Registered Nurse Rockwell Germany, RN as Registered Nurse Holley Bouche, NP as Nurse Practitioner (Nurse Practitioner) PCP: Donnamae Jude, MD OTHER MD:  CHIEF COMPLAINT: Estrogen receptor negative breast cancer  CURRENT TREATMENT: Neoadjuvant chemotherapy  BREAST CANCER HISTORY: From the original intake note:  Susan Wilson herself noted a mass in her right breast sometime around April. Initially she thought it might be related to menstruation, but as it did not change and eventually became tender, she brought it to her physician's attention. On 11/20/2014 patient underwent bilateral diagnostic mammography with tomosynthesis and right breast ultrasonography at the breast Center. The breast density was category B. There was a hyperdense mass in the right lower inner quadrant associated with skin thickening. There was also a 5 mm nodule posteriorly at the 8:30 o'clock position in the right breast. There were several hyperdense nodules in the right axilla. On physical exam there was a firm fixed mass in the right breast at the 5:00 position. By ultrasound this was lobulated and appear to involve the skin. It measures up to 4.1 cm. There was no sonographic correlation to the 5 mm nodule seen in a different area of the right breast. The right axilla showed 3 hypervascular lymph nodes with prominent cortical thickening, measuring less than 1.5 cm.  On 11/21/2014 the patient underwent right breast biopsy (5:00 mass) and biopsy of one of the suspicious right  axillary lymph nodes. 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  The patient's subsequent history is as detailed below  INTERVAL HISTORY: Susan Wilson returns today for follow up of her triple negative breast cancer, accompanied by a friend.Today is day 1, cycle 7 of 12 weekly planned doses of carboplatin and paclitaxel. The day after treatment last week she developed a rash to the back of her hands and bilateral arms. She took benadryl and the rash resolved. Unfortunately the benadryl causes her restless legs and she has difficultly sleeping despite ativan.   REVIEW OF SYSTEMS: Susan Wilson denies fevers or chills. She has some mild queeziness depending on smells that she is exposed to. She has loose stools a few days after treatment but it resolves on its own. Her appetite is fair despite taste changes. She denies mouth sores. She has some tinging to her fingers that comes and goes. She had numbness to her toes 30-40 minutes after treatment, but this has completely resolved. Her energy level is decent. A detailed review of systems is otherwise stable.  PAST MEDICAL HISTORY: Past Medical History  Diagnosis Date  . Breast cancer of lower-inner quadrant of right female breast ([facility]) 11/25/2014  . Breast cancer ([facility])   . Hernia, umbilical   . Back pain   . Headache   . Anemia   . Hernia, umbilical   . Family history of breast cancer   . 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 Portacath placement Right 12/16/2014    Procedure: INSERTION PORT-A-CATH WITH ULTRASOUND;  Surgeon: Rolm Bookbinder, MD;  Location: [facility];  Service: General;  Laterality: Right;    FAMILY HISTORY Family History    Problem Relation Age of Onset  . Mesothelioma Maternal Grandfather     asbestos exposure, died in his 25s  . Heart defect Sister 0  . Heart disease Paternal Aunt   . Heart disease Paternal Uncle   . Breast cancer Paternal Grandmother   . Diabetes Paternal Grandmother   . Cancer Paternal Grandfather     NOS  . Colon cancer Other 24    MGMs brother with colon cancer  . Cancer Other     several of MGF's sisters with cancer NOS  . Liver cancer Other     MGF's brother   the patient's parents are living, her father being 43 and her mother 62 as of July 2016. The patient had no brothers. One sister died at age 52 from cardiac problems. The other sister is in good health. On the maternal side there is a history of colon cancer and an uncle age 37, liver cancer in a great uncle and mesothelioma in the maternal grandfather.  GYNECOLOGIC HISTORY:  No LMP recorded. Menarche age 39. The patient is GX P2. 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 The daughters are Susan Wilson and Susan Wilson, age 37 and 36. The patient attends a local [place]: Not in place   HEALTH MAINTENANCE: Social History  Substance Use Topics  . Smoking status: Never Smoker   . Smokeless tobacco: Never Used  . Alcohol Use: Yes     Comment: rarely     Colonoscopy:  PAP: 2014  Bone density:  Lipid panel:  No Known Allergies  Current Outpatient Prescriptions  Medication Sig Dispense Refill  . HYDROcodone-acetaminophen (NORCO/VICODIN) 5-325 MG tablet Take 1 tablet by mouth every 6 (six) hours as needed for moderate pain. 20 tablet 0  . lidocaine-prilocaine (EMLA) cream Apply topically as needed. 30 g 1  . LORazepam (ATIVAN) 0.5 MG tablet Take 1 tablet (0.5 mg total) by mouth at bedtime. 30 tablet 0  . ondansetron (ZOFRAN) 8 MG tablet TAKE 1 TABLET BY MOUTH TWICE DAILY AS  NEEDED. START ON 3RD DAY AFTER CHEMOTHERAPY  0  . prochlorperazine (COMPAZINE) 10 MG tablet Take 1 tablet (10 mg total) by mouth every 6 (six) hours as needed for nausea or vomiting. 30 tablet 1  . tobramycin-dexamethasone (TOBRADEX) ophthalmic solution Place 1 drop into both eyes every 4 (four) hours while awake. 5 mL 1  . ibuprofen (ADVIL,MOTRIN) 200 MG tablet Take 400 mg by mouth every 6 (six) hours as needed (for pain).      No current facility-administered medications for this visit.    OBJECTIVE: Young African-American womanwho appears stated age  Susan Wilson Vitals:   04/08/15 0924  BP: 116/84  Pulse: 93  Temp: 98.3 F (36.8 C)  Resp: 18     Body mass index is 36.31 kg/(m^2).    ECOG FS:1 - Symptomatic but completely ambulatory  Skin: warm, dry  HEENT: sclerae anicteric, conjunctivae pink, oropharynx clear. No thrush or mucositis.  Lymph Nodes: No cervical or supraclavicular lymphadenopathy  Lungs: clear to auscultation bilaterally, no rales, wheezes, or rhonci  Heart: regular rate and rhythm  Abdomen: round, soft, non tender, positive  bowel sounds  Musculoskeletal: No focal spinal tenderness, no peripheral edema  Neuro: non focal, well oriented, positive affect  Breast: deferred   LAB RESULTS:  CMP  CBC Latest Ref Rng 04/08/2015 04/02/2015 03/27/2015  WBC 3.9 - 10.3 10e3/uL 2.7(L) 3.9 4.5  Hemoglobin 11.6 - 15.9 g/dL 9.0(L) 9.1(L) 8.9(L)  Hematocrit 34.8 - 46.6 % 28.7(L) 28.9(L) 28.0(L)  Platelets 145 - 400 10e3/uL 284 216 215   CMP Latest Ref Rng 04/02/2015 03/27/2015 03/19/2015  Glucose 70 - 140 mg/dl 89 109 126  BUN 7.0 - 26.0 mg/dL 11.7 9.8 10.6  Creatinine 0.6 - 1.1 mg/dL 0.8 0.7 0.7  Sodium 136 - 145 mEq/L 139 140 136  Potassium 3.5 - 5.1 mEq/L 4.3 3.8 4.1  Chloride 101 - 111 mmol/L - - -  CO2 22 - 29 mEq/L 25 26 27   Calcium 8.4 - 10.4 mg/dL 9.3 9.2 9.3  Total Protein 6.4 - 8.3 g/dL 7.1 6.6 6.6  Total Bilirubin 0.20 - 1.20 mg/dL 0.60 0.39 0.50  Alkaline Phos 40  - 150 U/L 74 82 70  AST 5 - 34 U/L 24 19 21   ALT 0 - 55 U/L 40 40 42      Urinalysis    Component Value Date/Time   LABSPEC 1.020 05/06/2009 1439   PHURINE 7.0 05/06/2009 1439   GLUCOSEU NEGATIVE 05/06/2009 1439   HGBUR NEGATIVE 05/06/2009 1439   BILIRUBINUR NEGATIVE 05/06/2009 1439   KETONESUR NEGATIVE 05/06/2009 1439   PROTEINUR NEGATIVE 05/06/2009 1439   UROBILINOGEN 0.2 05/06/2009 1439   NITRITE NEGATIVE 05/06/2009 1439   LEUKOCYTESUR  05/06/2009 1439    NEGATIVE Biochemical Testing Only. Please order routine urinalysis from main lab if confirmatory testing is needed.    STUDIES: No results found.  ASSESSMENT: 39 y.o. [place] woman s/p Right breast biopsy 11/21/2014 for a clinical T2 NX, stage 2 invasive ductal carcinoma, grade 2 or 3, estrogen and progesterone receptor negative, HER-2 equivocal, with an Mib-1 of 20%  (a) biopsy of a suspicious axillary lymph node same day was negative, but discordant  (b) review of 80 additional tumor cells by FISH still showed HER-2 not amplified; tumor should be treated as a triple negative  (1) neoadjuvant chemotherapy started 01/01/2015 consisting of cyclophosphamide and doxorubicin in dose dense fashion 4, with onpro support, completed 02/13/2015, followed by paclitaxel/ carboplatin weekly 12 started 02/26/2015   (2) genetics testing 12/15/2014 through the Breast/Ovarian gene panel offered by [facility] found no deleterious mutations in ATM, BARD1, BRCA1, BRCA2, BRIP1, CDH1, CHEK2, EPCAM, FANCC, MLH1, MSH2, MSH6, NBN, PALB2, PMS2, PTEN, RAD51C, RAD51D, TP53, and XRCC2  (3) definitive surgery  to follow chemotherapy  (4) radiation to follow surgery  PLAN: Kyler is faring well today. The labs were reviewed in detail and she has continued chemotherapy related anemia. She is asymptomatic at this time. We will proceed with cycle 7 of carboplatin and paclitaxel as planned. I advised her to consider taking pepcid daily for the next 3  days to prevent delayed reactions.   We will continue to monitor for increased neuropathy symptoms. At this time they are transient.   Camryn will return in 1 week for cycle 8 of treatment. She understands and agrees with this plan. She knows the goal of treatment in her case is cure. She has been encouraged to call with any issues that might arise before her next visit here.   Laurie Panda, NP   04/08/2015 9:53 AM

## 2015-04-08 NOTE — Patient Instructions (Signed)
[facility] Discharge Instructions for Patients Receiving Chemotherapy  Today you received the following chemotherapy agents Taxol and Carboplatin.  To help prevent nausea and vomiting after your treatment, we encourage you to take your nausea medication as prescribed.   If you develop nausea and vomiting that is not controlled by your nausea medication, call the clinic.   BELOW ARE SYMPTOMS THAT SHOULD BE REPORTED IMMEDIATELY:  *FEVER GREATER THAN 100.5 F  *CHILLS WITH OR WITHOUT FEVER  NAUSEA AND VOMITING THAT IS NOT CONTROLLED WITH YOUR NAUSEA MEDICATION  *UNUSUAL SHORTNESS OF BREATH  *UNUSUAL BRUISING OR BLEEDING  TENDERNESS IN MOUTH AND THROAT WITH OR WITHOUT PRESENCE OF ULCERS  *URINARY PROBLEMS  *BOWEL PROBLEMS  UNUSUAL RASH Items with * indicate a potential emergency and should be followed up as soon as possible.  Feel free to call the clinic you have any questions or concerns. The clinic phone number is [phone].  Please show the [facility] at check-in to the Emergency Department and triage nurse.

## 2015-04-10 ENCOUNTER — Ambulatory Visit: Payer: 59

## 2015-04-10 ENCOUNTER — Other Ambulatory Visit: Payer: 59

## 2015-04-10 ENCOUNTER — Ambulatory Visit: Payer: 59 | Admitting: Nurse Practitioner

## 2015-04-16 ENCOUNTER — Ambulatory Visit (HOSPITAL_BASED_OUTPATIENT_CLINIC_OR_DEPARTMENT_OTHER): Payer: 59 | Admitting: Nurse Practitioner

## 2015-04-16 ENCOUNTER — Encounter: Payer: Self-pay | Admitting: Nurse Practitioner

## 2015-04-16 ENCOUNTER — Other Ambulatory Visit (HOSPITAL_BASED_OUTPATIENT_CLINIC_OR_DEPARTMENT_OTHER): Payer: 59

## 2015-04-16 ENCOUNTER — Ambulatory Visit (HOSPITAL_BASED_OUTPATIENT_CLINIC_OR_DEPARTMENT_OTHER): Payer: 59

## 2015-04-16 ENCOUNTER — Other Ambulatory Visit: Payer: 59

## 2015-04-16 ENCOUNTER — Other Ambulatory Visit: Payer: Self-pay | Admitting: *Deleted

## 2015-04-16 VITALS — BP 119/80 | HR 87 | Temp 98.4°F | Resp 18 | Wt 220.5 lb

## 2015-04-16 DIAGNOSIS — R202 Paresthesia of skin: Secondary | ICD-10-CM | POA: Diagnosis not present

## 2015-04-16 DIAGNOSIS — C50311 Malignant neoplasm of lower-inner quadrant of right female breast: Secondary | ICD-10-CM

## 2015-04-16 DIAGNOSIS — R710 Precipitous drop in hematocrit: Secondary | ICD-10-CM

## 2015-04-16 DIAGNOSIS — D630 Anemia in neoplastic disease: Secondary | ICD-10-CM

## 2015-04-16 DIAGNOSIS — T451X5A Adverse effect of antineoplastic and immunosuppressive drugs, initial encounter: Secondary | ICD-10-CM

## 2015-04-16 DIAGNOSIS — J00 Acute nasopharyngitis [common cold]: Secondary | ICD-10-CM

## 2015-04-16 DIAGNOSIS — G620 Drug-induced polyneuropathy: Secondary | ICD-10-CM

## 2015-04-16 DIAGNOSIS — Z5111 Encounter for antineoplastic chemotherapy: Secondary | ICD-10-CM

## 2015-04-16 DIAGNOSIS — Z170 Estrogen receptor positive status [ER+]: Secondary | ICD-10-CM

## 2015-04-16 LAB — CBC WITH DIFFERENTIAL/PLATELET
BASO%: 0.7 % (ref 0.0–2.0)
Basophils Absolute: 0 10*3/uL (ref 0.0–0.1)
EOS ABS: 0 10*3/uL (ref 0.0–0.5)
EOS%: 0 % (ref 0.0–7.0)
HEMATOCRIT: 28 % — AB (ref 34.8–46.6)
HEMOGLOBIN: 8.8 g/dL — AB (ref 11.6–15.9)
LYMPH%: 37.1 % (ref 14.0–49.7)
MCH: 26.9 pg (ref 25.1–34.0)
MCHC: 31.4 g/dL — ABNORMAL LOW (ref 31.5–36.0)
MCV: 85.6 fL (ref 79.5–101.0)
MONO#: 0.4 10*3/uL (ref 0.1–0.9)
MONO%: 11.9 % (ref 0.0–14.0)
NEUT%: 50.3 % (ref 38.4–76.8)
NEUTROS ABS: 1.5 10*3/uL (ref 1.5–6.5)
PLATELETS: 286 10*3/uL (ref 145–400)
RBC: 3.27 10*6/uL — ABNORMAL LOW (ref 3.70–5.45)
RDW: 19.7 % — ABNORMAL HIGH (ref 11.2–14.5)
WBC: 3 10*3/uL — ABNORMAL LOW (ref 3.9–10.3)
lymph#: 1.1 10*3/uL (ref 0.9–3.3)

## 2015-04-16 LAB — COMPREHENSIVE METABOLIC PANEL (CC13)
ALT: 38 U/L (ref 0–55)
ANION GAP: 7 meq/L (ref 3–11)
AST: 23 U/L (ref 5–34)
Albumin: 3.9 g/dL (ref 3.5–5.0)
Alkaline Phosphatase: 69 U/L (ref 40–150)
BILIRUBIN TOTAL: 0.4 mg/dL (ref 0.20–1.20)
BUN: 9.6 mg/dL (ref 7.0–26.0)
CALCIUM: 9.5 mg/dL (ref 8.4–10.4)
CO2: 24 meq/L (ref 22–29)
CREATININE: 0.7 mg/dL (ref 0.6–1.1)
Chloride: 107 mEq/L (ref 98–109)
EGFR: >90 mL/min/{1.73_m2} (ref >90)
Glucose: 98 mg/dl (ref 70–140)
Potassium: 4 mEq/L (ref 3.5–5.1)
Sodium: 138 mEq/L (ref 136–145)
TOTAL PROTEIN: 7 g/dL (ref 6.4–8.3)

## 2015-04-16 MED ORDER — DEXTROSE 5 % IV SOLN
80.0000 mg/m2 | Freq: Once | INTRAVENOUS | Status: AC
  Administered 2015-04-16: 174 mg via INTRAVENOUS
  Filled 2015-04-16: qty 29

## 2015-04-16 MED ORDER — TOBRAMYCIN-DEXAMETHASONE 0.3-0.1 % OP SUSP: 1.0000 [drp] | OPHTHALMIC | Status: DC

## 2015-04-16 MED ORDER — SODIUM CHLORIDE 0.9 % IV SOLN
Freq: Once | INTRAVENOUS | Status: AC
  Administered 2015-04-16: 10:00:00 via INTRAVENOUS

## 2015-04-16 MED ORDER — SODIUM CHLORIDE 0.9 % IV SOLN
Freq: Once | INTRAVENOUS | Status: AC
  Administered 2015-04-16: 10:00:00 via INTRAVENOUS
  Filled 2015-04-16: qty 8

## 2015-04-16 MED ORDER — DIPHENHYDRAMINE HCL 50 MG/ML IJ SOLN
INTRAMUSCULAR | Status: AC
  Filled 2015-04-16: qty 1

## 2015-04-16 MED ORDER — DIPHENHYDRAMINE HCL 50 MG/ML IJ SOLN
25.0000 mg | Freq: Once | INTRAMUSCULAR | Status: AC
  Administered 2015-04-16: 25 mg via INTRAVENOUS

## 2015-04-16 MED ORDER — FAMOTIDINE IN NACL 20-0.9 MG/50ML-% IV SOLN
20.0000 mg | Freq: Once | INTRAVENOUS | Status: AC
  Administered 2015-04-16: 20 mg via INTRAVENOUS

## 2015-04-16 MED ORDER — HEPARIN SOD (PORK) LOCK FLUSH 100 UNIT/ML IV SOLN
500.0000 [IU] | Freq: Once | INTRAVENOUS | Status: AC | PRN
  Administered 2015-04-16: 500 [IU]
  Filled 2015-04-16: qty 5

## 2015-04-16 MED ORDER — FAMOTIDINE IN NACL 20-0.9 MG/50ML-% IV SOLN
INTRAVENOUS | Status: AC
  Filled 2015-04-16: qty 50

## 2015-04-16 MED ORDER — SODIUM CHLORIDE 0.9 % IV SOLN
300.0000 mg | Freq: Once | INTRAVENOUS | Status: AC
  Administered 2015-04-16: 300 mg via INTRAVENOUS
  Filled 2015-04-16: qty 30

## 2015-04-16 MED ORDER — SODIUM CHLORIDE 0.9 % IJ SOLN
10.0000 mL | INTRAMUSCULAR | Status: DC | PRN
  Administered 2015-04-16: 10 mL
  Filled 2015-04-16: qty 10

## 2015-04-16 NOTE — Patient Instructions (Signed)
[facility] Discharge Instructions for Patients Receiving Chemotherapy  Today you received the following chemotherapy agents Taxol and Carboplatin.  To help prevent nausea and vomiting after your treatment, we encourage you to take your nausea medication as prescribed.   If you develop nausea and vomiting that is not controlled by your nausea medication, call the clinic.   BELOW ARE SYMPTOMS THAT SHOULD BE REPORTED IMMEDIATELY:  *FEVER GREATER THAN 100.5 F  *CHILLS WITH OR WITHOUT FEVER  NAUSEA AND VOMITING THAT IS NOT CONTROLLED WITH YOUR NAUSEA MEDICATION  *UNUSUAL SHORTNESS OF BREATH  *UNUSUAL BRUISING OR BLEEDING  TENDERNESS IN MOUTH AND THROAT WITH OR WITHOUT PRESENCE OF ULCERS  *URINARY PROBLEMS  *BOWEL PROBLEMS  UNUSUAL RASH Items with * indicate a potential emergency and should be followed up as soon as possible.  Feel free to call the clinic you have any questions or concerns. The clinic phone number is [phone].  Please show the [facility] at check-in to the Emergency Department and triage nurse.

## 2015-04-16 NOTE — Progress Notes (Signed)
[place]  [phone] [phone]     ID: Susan Wilson DOB: 1976/03/11  MR#: [id number]  YQM#:[id number]  Patient Care Team: Donnamae Jude, MD as PCP - General (Obstetrics and Gynecology) Rolm Bookbinder, MD as Consulting Physician (General Surgery) Chauncey Cruel, MD as Consulting Physician (Oncology) Thea Silversmith, MD as Consulting Physician (Radiation Oncology) [name], RN as Registered Nurse Rockwell Germany, RN as Registered Nurse Holley Bouche, NP as Nurse Practitioner (Nurse Practitioner) PCP: Donnamae Jude, MD OTHER MD:  CHIEF COMPLAINT: Estrogen receptor negative breast cancer  CURRENT TREATMENT: Neoadjuvant chemotherapy  BREAST CANCER HISTORY: From the original intake note:  Cylah herself noted a mass in her right breast sometime around April. Initially she thought it might be related to menstruation, but as it did not change and eventually became tender, she brought it to her physician's attention. On 11/20/2014 patient underwent bilateral diagnostic mammography with tomosynthesis and right breast ultrasonography at the breast Center. The breast density was category B. There was a hyperdense mass in the right lower inner quadrant associated with skin thickening. There was also a 5 mm nodule posteriorly at the 8:30 o'clock position in the right breast. There were several hyperdense nodules in the right axilla. On physical exam there was a firm fixed mass in the right breast at the 5:00 position. By ultrasound this was lobulated and appear to involve the skin. It measures up to 4.1 cm. There was no sonographic correlation to the 5 mm nodule seen in a different area of the right breast. The right axilla showed 3 hypervascular lymph nodes with prominent cortical thickening, measuring less than 1.5 cm.  On 11/21/2014 the patient underwent right breast biopsy (5:00 mass) and biopsy of one of the suspicious right  axillary lymph nodes. 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 Today is day 1, cycle 8 of 12 weekly planned doses of carboplatin and paclitaxel.   REVIEW OF SYSTEMS: Crosby has an URI that she has been managing with mucinex and has seen some improvement. She denies fevers or chills. Her nausea is well managed with compazine. She is moving her bowels every other day. She denies mouth sores or rashes. She has tingling to her toes right after treatment, and again generally at night. This is not much of an issue during the day. Her energy level is pretty good. A detailed review of systems is otherwise stable.   PAST MEDICAL HISTORY: Past Medical History  Diagnosis Date  . Breast cancer of lower-inner quadrant of right female breast ([place]) 11/25/2014  . Breast cancer ([place])   . Hernia, umbilical   . Back pain   . Headache   . Anemia   . Hernia, umbilical   . Family history of breast cancer   . 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 Portacath placement Right 12/16/2014    Procedure: INSERTION PORT-A-CATH WITH ULTRASOUND;  Surgeon: Rolm Bookbinder, MD;  Location: [facility];  Service: General;  Laterality: Right;    FAMILY HISTORY Family History  Problem Relation Age of Onset  . Mesothelioma Maternal Grandfather     asbestos exposure, died in his 22s  . Heart defect Sister 0  . Heart disease Paternal Aunt   . Heart disease Paternal Uncle   . Breast cancer Paternal Grandmother   . Diabetes Paternal  Grandmother   . Cancer Paternal Grandfather     NOS  . Colon cancer Other 54    MGMs brother with colon cancer  . Cancer Other     several of MGF's sisters with cancer NOS  . Liver cancer Other     MGF's brother   the patient's parents are living, her father being 73 and her mother 44 as of July 2016. The patient had no brothers. One sister died at age 43 from cardiac problems. The other sister is in good health. On the maternal side there is a history of colon cancer and an uncle age 21, liver cancer in a great uncle and mesothelioma in the maternal grandfather.  GYNECOLOGIC HISTORY:  No LMP recorded. Menarche age 60. The patient is GX P2. She still having regular periods  SOCIAL HISTORY:  Chrystie works in Therapist, art for [employer]. Her husband Antrell works for [employer]. The daughters are [place] and Lovie Macadamia, age 66 and 69. The patient attends a local [place]: Not in place   HEALTH MAINTENANCE: Social History  Substance Use Topics  . Smoking status: Never Smoker   . Smokeless tobacco: Never Used  . Alcohol Use: Yes     Comment: rarely     Colonoscopy:  PAP: 2014  Bone density:  Lipid panel:  No Known Allergies  Current Outpatient Prescriptions  Medication Sig Dispense Refill  . lidocaine-prilocaine (EMLA) cream Apply topically as needed. 30 g 1  . LORazepam (ATIVAN) 0.5 MG tablet Take 1 tablet (0.5 mg total) by mouth at bedtime. 30 tablet 0  . ondansetron (ZOFRAN) 8 MG tablet TAKE 1 TABLET BY MOUTH TWICE DAILY AS NEEDED. START ON 3RD DAY AFTER CHEMOTHERAPY  0  . prochlorperazine (COMPAZINE) 10 MG tablet Take 1 tablet (10 mg total) by mouth every 6 (six) hours as needed for nausea or vomiting. 30 tablet 1  . tobramycin-dexamethasone (TOBRADEX) ophthalmic solution Place 1 drop into both eyes every 4 (four) hours while awake. 5 mL 1  . HYDROcodone-acetaminophen (NORCO/VICODIN) 5-325 MG tablet Take 1 tablet by mouth  every 6 (six) hours as needed for moderate pain. (Patient not taking: Reported on 04/16/2015) 20 tablet 0  . ibuprofen (ADVIL,MOTRIN) 200 MG tablet Take 400 mg by mouth every 6 (six) hours as needed (for pain).      No current facility-administered medications for this visit.    OBJECTIVE: Young African-American womanwho appears stated age  38 Vitals:   04/16/15 0859  BP: 119/80  Pulse: 87  Temp: 98.4 F (36.9 C)  Resp: 18     Body mass index is 36.69 kg/(m^2).    ECOG FS:1 - Symptomatic but completely ambulatory  Sclerae unicteric, pupils round and equal Oropharynx clear and moist-- no thrush or other lesions No cervical or supraclavicular adenopathy Lungs no rales or rhonchi Heart regular rate and rhythm Abd soft, nontender, positive bowel sounds MSK no focal spinal tenderness, no upper extremity lymphedema Neuro: nonfocal, well oriented, appropriate affect Breasts: deferred  LAB RESULTS:  CMP  CBC Latest Ref Rng 04/16/2015 04/08/2015 04/02/2015  WBC 3.9 - 10.3 10e3/uL 3.0(L) 2.7(L) 3.9  Hemoglobin 11.6 - 15.9 g/dL 8.8(L)  9.0(L) 9.1(L)  Hematocrit 34.8 - 46.6 % 28.0(L) 28.7(L) 28.9(L)  Platelets 145 - 400 10e3/uL 286 284 216   CMP Latest Ref Rng 04/16/2015 04/08/2015 04/02/2015  Glucose 70 - 140 mg/dl 98 125 89  BUN 7.0 - 26.0 mg/dL 9.6 7.3 11.7  Creatinine 0.6 - 1.1 mg/dL 0.7 0.8 0.8  Sodium 136 - 145 mEq/L 138 137 139  Potassium 3.5 - 5.1 mEq/L 4.0 3.9 4.3  Chloride 101 - 111 mmol/L - - -  CO2 22 - 29 mEq/L _0 Calcium 8.4 - 10.4 mg/dL 9.5 9.4 9.3  Total Protein 6.4 - 8.3 g/dL 7.0 7.1 7.1  Total Bilirubin 0.20 - 1.20 mg/dL 0.40 0.60 0.60  Alkaline Phos 40 - 150 U/L 69 74 74  AST 5 - 34 U/L 23 32 24  ALT 0 - 55 U/L 38 47 40      Urinalysis    Component Value Date/Time   LABSPEC 1.020 05/06/2009 1439   PHURINE 7.0 05/06/2009 1439   GLUCOSEU NEGATIVE 05/06/2009 1439   HGBUR NEGATIVE 05/06/2009 1439   BILIRUBINUR NEGATIVE 05/06/2009 1439   KETONESUR  NEGATIVE 05/06/2009 1439   PROTEINUR NEGATIVE 05/06/2009 1439   UROBILINOGEN 0.2 05/06/2009 1439   NITRITE NEGATIVE 05/06/2009 1439   LEUKOCYTESUR  05/06/2009 1439    NEGATIVE Biochemical Testing Only. Please order routine urinalysis from main lab if confirmatory testing is needed.    STUDIES: No results found.  ASSESSMENT: 39 y.o. [place] woman s/p Right breast biopsy 11/21/2014 for a clinical T2 NX, stage 2 invasive ductal carcinoma, grade 2 or 3, estrogen and progesterone receptor negative, HER-2 equivocal, with an Mib-1 of 20%  (a) biopsy of a suspicious axillary lymph node same day was negative, but discordant  (b) review of 80 additional tumor cells by FISH still showed HER-2 not amplified; tumor should be treated as a triple negative  (1) neoadjuvant chemotherapy started 01/01/2015 consisting of cyclophosphamide and doxorubicin in dose dense fashion 4, with onpro support, completed 02/13/2015, followed by paclitaxel/ carboplatin weekly 12 started 02/26/2015   (2) genetics testing 12/15/2014 through the Breast/Ovarian gene panel offered by [facility] found no deleterious mutations in ATM, BARD1, BRCA1, BRCA2, BRIP1, CDH1, CHEK2, EPCAM, FANCC, MLH1, MSH2, MSH6, NBN, PALB2, PMS2, PTEN, RAD51C, RAD51D, TP53, and XRCC2  (3) definitive surgery  to follow chemotherapy  (4) radiation to follow surgery  PLAN: Venita has a head cold ,but otherwise has no complaints to offer. The labs were reviewed in detail and her hgb continues to drop. It is down to 8.8 today, but she is no more fatigued or short of breath than usual. We will proceed with cycle 8 of carboplatin and paclitaxel as planned. We will continue to monitor for increased neuropathy symptoms. At this time they are transient, and none are present on today's exam.   Megha will return in 1 week for cycle 9 of treatment. She understands and agrees with this plan. She knows the goal of treatment in her case is cure. She has been  encouraged to call with any issues that might arise before her next visit here.   Laurie Panda, NP   04/16/2015 9:33 AM

## 2015-04-17 ENCOUNTER — Ambulatory Visit: Payer: 59

## 2015-04-23 ENCOUNTER — Telehealth: Payer: Self-pay | Admitting: Nurse Practitioner

## 2015-04-23 ENCOUNTER — Ambulatory Visit (HOSPITAL_BASED_OUTPATIENT_CLINIC_OR_DEPARTMENT_OTHER): Payer: 59

## 2015-04-23 ENCOUNTER — Other Ambulatory Visit: Payer: 59

## 2015-04-23 ENCOUNTER — Other Ambulatory Visit (HOSPITAL_BASED_OUTPATIENT_CLINIC_OR_DEPARTMENT_OTHER): Payer: 59

## 2015-04-23 ENCOUNTER — Encounter: Payer: Self-pay | Admitting: *Deleted

## 2015-04-23 ENCOUNTER — Encounter: Payer: Self-pay | Admitting: Nurse Practitioner

## 2015-04-23 ENCOUNTER — Ambulatory Visit (HOSPITAL_BASED_OUTPATIENT_CLINIC_OR_DEPARTMENT_OTHER): Payer: 59 | Admitting: Nurse Practitioner

## 2015-04-23 VITALS — BP 110/73 | HR 96 | Temp 97.9°F | Resp 18 | Ht 65.0 in | Wt 224.0 lb

## 2015-04-23 DIAGNOSIS — C50311 Malignant neoplasm of lower-inner quadrant of right female breast: Secondary | ICD-10-CM | POA: Diagnosis not present

## 2015-04-23 DIAGNOSIS — Z171 Estrogen receptor negative status [ER-]: Secondary | ICD-10-CM

## 2015-04-23 DIAGNOSIS — D649 Anemia, unspecified: Secondary | ICD-10-CM | POA: Diagnosis not present

## 2015-04-23 DIAGNOSIS — D6481 Anemia due to antineoplastic chemotherapy: Secondary | ICD-10-CM

## 2015-04-23 DIAGNOSIS — Z5111 Encounter for antineoplastic chemotherapy: Secondary | ICD-10-CM

## 2015-04-23 DIAGNOSIS — T451X5A Adverse effect of antineoplastic and immunosuppressive drugs, initial encounter: Secondary | ICD-10-CM

## 2015-04-23 LAB — CBC WITH DIFFERENTIAL/PLATELET
BASO%: 0.7 % (ref 0.0–2.0)
BASOS ABS: 0 10*3/uL (ref 0.0–0.1)
EOS ABS: 0 10*3/uL (ref 0.0–0.5)
EOS%: 0.4 % (ref 0.0–7.0)
HEMATOCRIT: 27.8 % — AB (ref 34.8–46.6)
HEMOGLOBIN: 8.8 g/dL — AB (ref 11.6–15.9)
LYMPH#: 1 10*3/uL (ref 0.9–3.3)
LYMPH%: 31.7 % (ref 14.0–49.7)
MCH: 27.1 pg (ref 25.1–34.0)
MCHC: 31.6 g/dL (ref 31.5–36.0)
MCV: 85.7 fL (ref 79.5–101.0)
MONO#: 0.3 10*3/uL (ref 0.1–0.9)
MONO%: 9.9 % (ref 0.0–14.0)
NEUT#: 1.8 10*3/uL (ref 1.5–6.5)
NEUT%: 57.3 % (ref 38.4–76.8)
PLATELETS: 232 10*3/uL (ref 145–400)
RBC: 3.24 10*6/uL — ABNORMAL LOW (ref 3.70–5.45)
RDW: 21.2 % — AB (ref 11.2–14.5)
WBC: 3.1 10*3/uL — ABNORMAL LOW (ref 3.9–10.3)

## 2015-04-23 LAB — COMPREHENSIVE METABOLIC PANEL
ALBUMIN: 3.8 g/dL (ref 3.5–5.0)
ALT: 56 U/L — ABNORMAL HIGH (ref 0–55)
ANION GAP: 10 meq/L (ref 3–11)
AST: 41 U/L — ABNORMAL HIGH (ref 5–34)
Alkaline Phosphatase: 68 U/L (ref 40–150)
BILIRUBIN TOTAL: 0.54 mg/dL (ref 0.20–1.20)
BUN: 7.9 mg/dL (ref 7.0–26.0)
CALCIUM: 9.3 mg/dL (ref 8.4–10.4)
CHLORIDE: 106 meq/L (ref 98–109)
CO2: 23 mEq/L (ref 22–29)
CREATININE: 0.8 mg/dL (ref 0.6–1.1)
EGFR: >90 mL/min/{1.73_m2} (ref >90)
Glucose: 106 mg/dl (ref 70–140)
POTASSIUM: 4 meq/L (ref 3.5–5.1)
Sodium: 138 mEq/L (ref 136–145)
Total Protein: 7 g/dL (ref 6.4–8.3)

## 2015-04-23 MED ORDER — FAMOTIDINE IN NACL 20-0.9 MG/50ML-% IV SOLN
20.0000 mg | Freq: Once | INTRAVENOUS | Status: AC
  Administered 2015-04-23: 20 mg via INTRAVENOUS

## 2015-04-23 MED ORDER — HEPARIN SOD (PORK) LOCK FLUSH 100 UNIT/ML IV SOLN
500.0000 [IU] | Freq: Once | INTRAVENOUS | Status: AC | PRN
  Administered 2015-04-23: 500 [IU]
  Filled 2015-04-23: qty 5

## 2015-04-23 MED ORDER — DIPHENHYDRAMINE HCL 50 MG/ML IJ SOLN
INTRAMUSCULAR | Status: AC
Start: 2015-04-23 — End: 2015-04-23
  Filled 2015-04-23: qty 1

## 2015-04-23 MED ORDER — DEXTROSE 5 % IV SOLN
80.0000 mg/m2 | Freq: Once | INTRAVENOUS | Status: AC
  Administered 2015-04-23: 174 mg via INTRAVENOUS
  Filled 2015-04-23: qty 29

## 2015-04-23 MED ORDER — DEXAMETHASONE SODIUM PHOSPHATE 100 MG/10ML IJ SOLN
Freq: Once | INTRAMUSCULAR | Status: AC
  Administered 2015-04-23: 10:00:00 via INTRAVENOUS
  Filled 2015-04-23: qty 8

## 2015-04-23 MED ORDER — SODIUM CHLORIDE 0.9 % IV SOLN
300.0000 mg | Freq: Once | INTRAVENOUS | Status: AC
  Administered 2015-04-23: 300 mg via INTRAVENOUS
  Filled 2015-04-23: qty 30

## 2015-04-23 MED ORDER — FAMOTIDINE IN NACL 20-0.9 MG/50ML-% IV SOLN
INTRAVENOUS | Status: AC
  Filled 2015-04-23: qty 50

## 2015-04-23 MED ORDER — SODIUM CHLORIDE 0.9 % IV SOLN
Freq: Once | INTRAVENOUS | Status: AC
  Administered 2015-04-23: 09:00:00 via INTRAVENOUS

## 2015-04-23 MED ORDER — SODIUM CHLORIDE 0.9 % IJ SOLN
10.0000 mL | INTRAMUSCULAR | Status: DC | PRN
  Administered 2015-04-23: 10 mL
  Filled 2015-04-23: qty 10

## 2015-04-23 MED ORDER — DIPHENHYDRAMINE HCL 50 MG/ML IJ SOLN
25.0000 mg | Freq: Once | INTRAMUSCULAR | Status: AC
  Administered 2015-04-23: 25 mg via INTRAVENOUS

## 2015-04-23 NOTE — Progress Notes (Signed)
[place]  Telephone:(336) [phone] Fax:(336) [phone]     ID: Susan Wilson DOB: 27-Jan-1976  MR#: [id number]  TFT#:[id number]  Patient Care Team: Donnamae Jude, MD as PCP - General (Obstetrics and Gynecology) Rolm Bookbinder, MD as Consulting Physician (General Surgery) Chauncey Cruel, MD as Consulting Physician (Oncology) Thea Silversmith, MD as Consulting Physician (Radiation Oncology) [name], RN as Registered Nurse Rockwell Germany, RN as Registered Nurse Holley Bouche, NP as Nurse Practitioner (Nurse Practitioner) PCP: Donnamae Jude, MD OTHER MD:  CHIEF COMPLAINT: Estrogen receptor negative breast cancer  CURRENT TREATMENT: Neoadjuvant chemotherapy  BREAST CANCER HISTORY: From the original intake note:  Susan Wilson herself noted a mass in her right breast sometime around April. Initially she thought it might be related to menstruation, but as it did not change and eventually became tender, she brought it to her physician's attention. On 11/20/2014 patient underwent bilateral diagnostic mammography with tomosynthesis and right breast ultrasonography at the breast Center. The breast density was category B. There was a hyperdense mass in the right lower inner quadrant associated with skin thickening. There was also a 5 mm nodule posteriorly at the 8:30 o'clock position in the right breast. There were several hyperdense nodules in the right axilla. On physical exam there was a firm fixed mass in the right breast at the 5:00 position. By ultrasound this was lobulated and appear to involve the skin. It measures up to 4.1 cm. There was no sonographic correlation to the 5 mm nodule seen in a different area of the right breast. The right axilla showed 3 hypervascular lymph nodes with prominent cortical thickening, measuring less than 1.5 cm.  On 11/21/2014 the patient underwent right breast biopsy (5:00 mass) and biopsy of one of the suspicious right  axillary lymph nodes. 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  The patient's subsequent history is as detailed below  INTERVAL HISTORY: Susan Wilson returns today for follow up of her triple negative breast cancer, accompanied by her sister. Today is day 1, cycle 9 of 12 weekly planned doses of carboplatin and paclitaxel.   REVIEW OF SYSTEMS: Vergie as recovered fully from her URI. She has had no neuropathy symptoms today, just soreness to her finger nails. She is applying tea tree oil TID with some relief. She is having some intermittent leg pain at night. She takes 1/2 tablet of norco along with her lorazepam and is able to sleep through it. She denies fevers or chills. She has nausea when exposed to certain smells, but does not dry heave or vomit. She is moving her bowels well. She denies mouth sores or rashes. Her energy level is good. She denies shortness of breath, headaches, or dizziness. A detailed review of systems is otherwise stable.   PAST MEDICAL HISTORY: Past Medical History  Diagnosis Date  . Breast cancer of lower-inner quadrant of right female breast ([place]) 11/25/2014  . Breast cancer ([place])   . Hernia, umbilical   . Back pain   . Headache   . Anemia   . Hernia, umbilical   . Family history of breast cancer   . 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 Portacath placement Right 12/16/2014  Procedure: INSERTION PORT-A-CATH WITH ULTRASOUND;  Surgeon: Rolm Bookbinder, MD;  Location: [facility];  Service: General;  Laterality: Right;    FAMILY HISTORY Family History  Problem Relation Age of Onset  . Mesothelioma Maternal Grandfather     asbestos exposure, died in his 68s   . Heart defect Sister 0  . Heart disease Paternal Aunt   . Heart disease Paternal Uncle   . Breast cancer Paternal Grandmother   . Diabetes Paternal Grandmother   . Cancer Paternal Grandfather     NOS  . Colon cancer Other 42    MGMs brother with colon cancer  . Cancer Other     several of MGF's sisters with cancer NOS  . Liver cancer Other     MGF's brother   the patient's parents are living, her father being 17 and her mother 34 as of July 2016. The patient had no brothers. One sister died at age 15 from cardiac problems. The other sister is in good health. On the maternal side there is a history of colon cancer and an uncle age 24, liver cancer in a great uncle and mesothelioma in the maternal grandfather.  GYNECOLOGIC HISTORY:  No LMP recorded. Menarche age 65. The patient is GX P2. 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 The daughters are Susan Wilson and Susan Wilson, age 61 and 39. The patient attends a local [place]: Not in place   HEALTH MAINTENANCE: Social History  Substance Use Topics  . Smoking status: Never Smoker   . Smokeless tobacco: Never Used  . Alcohol Use: Yes     Comment: rarely     Colonoscopy:  PAP: 2014  Bone density:  Lipid panel:  No Known Allergies  Current Outpatient Prescriptions  Medication Sig Dispense Refill  . ibuprofen (ADVIL,MOTRIN) 200 MG tablet Take 400 mg by mouth every 6 (six) hours as needed (for pain).     Marland Kitchen lidocaine-prilocaine (EMLA) cream Apply topically as needed. 30 g 1  . LORazepam (ATIVAN) 0.5 MG tablet Take 1 tablet (0.5 mg total) by mouth at bedtime. 30 tablet 0  . ondansetron (ZOFRAN) 8 MG tablet TAKE 1 TABLET BY MOUTH TWICE DAILY AS NEEDED. START ON 3RD DAY AFTER CHEMOTHERAPY  0  . tobramycin-dexamethasone (TOBRADEX) ophthalmic solution Place 1 drop into both eyes every 4  (four) hours while awake. 5 mL 1  . HYDROcodone-acetaminophen (NORCO/VICODIN) 5-325 MG tablet Take 1 tablet by mouth every 6 (six) hours as needed for moderate pain. (Patient not taking: Reported on 04/16/2015) 20 tablet 0  . prochlorperazine (COMPAZINE) 10 MG tablet Take 1 tablet (10 mg total) by mouth every 6 (six) hours as needed for nausea or vomiting. (Patient not taking: Reported on 04/23/2015) 30 tablet 1   No current facility-administered medications for this visit.   Facility-Administered Medications Ordered in Other Visits  Medication Dose Route Frequency Provider Last Rate Last Dose  . CARBOplatin (PARAPLATIN) 300 mg in sodium chloride 0.9 % 100 mL chemo infusion  300 mg Intravenous Once Chauncey Cruel, MD      . famotidine (PEPCID) IVPB 20 mg premix  20 mg Intravenous Once Chauncey Cruel, MD      . heparin lock flush 100 unit/mL  500 Units Intracatheter Once PRN Chauncey Cruel, MD      . ondansetron (ZOFRAN) 16 mg, dexamethasone (DECADRON) 10 mg in sodium chloride  0.9 % 50 mL IVPB   Intravenous Once Chauncey Cruel, MD      . PACLitaxel (TAXOL) 174 mg in dextrose 5 % 250 mL chemo infusion (</= 3m/m2)  80 mg/m2 (Treatment Plan Actual) Intravenous Once GChauncey Cruel MD      . sodium chloride 0.9 % injection 10 mL  10 mL Intracatheter PRN GChauncey Cruel MD        OBJECTIVE: Young African-American woman who appears stated age  F71Vitals:   04/23/15 0845  BP: 110/73  Pulse: 96  Temp: 97.9 F (36.6 C)  Resp: 18     Body mass index is 37.28 kg/(m^2).    ECOG FS:1 - Symptomatic but completely ambulatory  Skin: warm, dry  HEENT: sclerae anicteric, conjunctivae pink, oropharynx clear. No thrush or mucositis.  Lymph Nodes: No cervical or supraclavicular lymphadenopathy  Lungs: clear to auscultation bilaterally, no rales, wheezes, or rhonci  Heart: regular rate and rhythm  Abdomen: round, soft, non tender, positive bowel sounds  Musculoskeletal: No focal spinal  tenderness, no peripheral edema  Neuro: non focal, well oriented, positive affect  Breasts: deferred  LAB RESULTS:  CMP  CBC Latest Ref Rng 04/23/2015 04/16/2015 04/08/2015  WBC 3.9 - 10.3 10e3/uL 3.1(L) 3.0(L) 2.7(L)  Hemoglobin 11.6 - 15.9 g/dL 8.8(L) 8.8(L) 9.0(L)  Hematocrit 34.8 - 46.6 % 27.8(L) 28.0(L) 28.7(L)  Platelets 145 - 400 10e3/uL 232 286 284   CMP Latest Ref Rng 04/23/2015 04/16/2015 04/08/2015  Glucose 70 - 140 mg/dl 106 98 125  BUN 7.0 - 26.0 mg/dL 7.9 9.6 7.3  Creatinine 0.6 - 1.1 mg/dL 0.8 0.7 0.8  Sodium 136 - 145 mEq/L 138 138 137  Potassium 3.5 - 5.1 mEq/L 4.0 4.0 3.9  Chloride 101 - 111 mmol/L - - -  CO2 22 - 29 mEq/L _0 Calcium 8.4 - 10.4 mg/dL 9.3 9.5 9.4  Total Protein 6.4 - 8.3 g/dL 7.0 7.0 7.1  Total Bilirubin 0.20 - 1.20 mg/dL 0.54 0.40 0.60  Alkaline Phos 40 - 150 U/L 68 69 74  AST 5 - 34 U/L 41(H) 23 32  ALT 0 - 55 U/L 56(H) 38 47      Urinalysis    Component Value Date/Time   LABSPEC 1.020 05/06/2009 1439   PHURINE 7.0 05/06/2009 1439   GLUCOSEU NEGATIVE 05/06/2009 1439   HGBUR NEGATIVE 05/06/2009 1439   BILIRUBINUR NEGATIVE 05/06/2009 1439   KETONESUR NEGATIVE 05/06/2009 1439   PROTEINUR NEGATIVE 05/06/2009 1439   UROBILINOGEN 0.2 05/06/2009 1439   NITRITE NEGATIVE 05/06/2009 1439   LEUKOCYTESUR  05/06/2009 1439    NEGATIVE Biochemical Testing Only. Please order routine urinalysis from main lab if confirmatory testing is needed.    STUDIES: No results found.  ASSESSMENT: 39y.o. [place] woman s/p Right breast biopsy 11/21/2014 for a clinical T2 NX, stage 2 invasive ductal carcinoma, grade 2 or 3, estrogen and progesterone receptor negative, HER-2 equivocal, with an Mib-1 of 20%  (a) biopsy of a suspicious axillary lymph node same day was negative, but discordant  (b) review of 80 additional tumor cells by FISH still showed HER-2 not amplified; tumor should be treated as a triple negative  (1) neoadjuvant chemotherapy started  01/01/2015 consisting of cyclophosphamide and doxorubicin in dose dense fashion 4, with onpro support, completed 02/13/2015, followed by paclitaxel/ carboplatin weekly 12 started 02/26/2015   (2) genetics testing 12/15/2014 through the Breast/Ovarian gene panel offered by [facility] found no deleterious mutations in ATM, BARD1, BRCA1, BRCA2, BRIP1,  CDH1, CHEK2, EPCAM, FANCC, MLH1, MSH2, MSH6, NBN, PALB2, PMS2, PTEN, RAD51C, RAD51D, TP53, and XRCC2  (3) definitive surgery  to follow chemotherapy  (4) radiation to follow surgery  PLAN: Rafaella continues to manage treatment well. Neuropathy was not a concern today. The labs were reviewed in detail and were stable. She is asymptomatic despite treatment related anemia with an hgb of 8.8. She will proceed with cycle 9 of pacliatxel and carboplatin as planned.  I have ordered her final breast MRI to hopefully occur on 12/30 after her 12th treatment.   Gretchen will return in 1 week for cycle 10 of treatment. She understands and agrees with this plan. She knows the goal of treatment in her case is cure. She has been encouraged to call with any issues that might arise before her next visit here.   Laurie Panda, NP   04/23/2015 9:39 AM

## 2015-04-23 NOTE — Patient Instructions (Signed)
[facility] Discharge Instructions for Patients Receiving Chemotherapy  Today you received the following chemotherapy agents Taxol/Carboplatin To help prevent nausea and vomiting after your treatment, we encourage you to take your nausea medication as prescribed.   If you develop nausea and vomiting that is not controlled by your nausea medication, call the clinic.   BELOW ARE SYMPTOMS THAT SHOULD BE REPORTED IMMEDIATELY:  *FEVER GREATER THAN 100.5 F  *CHILLS WITH OR WITHOUT FEVER  NAUSEA AND VOMITING THAT IS NOT CONTROLLED WITH YOUR NAUSEA MEDICATION  *UNUSUAL SHORTNESS OF BREATH  *UNUSUAL BRUISING OR BLEEDING  TENDERNESS IN MOUTH AND THROAT WITH OR WITHOUT PRESENCE OF ULCERS  *URINARY PROBLEMS  *BOWEL PROBLEMS  UNUSUAL RASH Items with * indicate a potential emergency and should be followed up as soon as possible.  Feel free to call the clinic you have any questions or concerns. The clinic phone number is [phone].  Please show the [facility] at check-in to the Emergency Department and triage nurse.

## 2015-04-23 NOTE — Telephone Encounter (Signed)
Mri order faxed to [facility]  For mri

## 2015-04-24 ENCOUNTER — Telehealth: Payer: Self-pay | Admitting: *Deleted

## 2015-04-24 ENCOUNTER — Ambulatory Visit: Payer: 59

## 2015-04-24 NOTE — Telephone Encounter (Signed)
This RN called pt per follow up of on call note to [facility] with MD recommendation to take a dose of decadron 327m.  Today Susan Wilson states itching is much better-" but still feel like it is lingering and wants to come back ".  Recommendation is to continue decadron at 243m( 1/2 of her 27m17mablet ) bid over the weekend and to call this office on Monday with update.  If needed pt may take benadryl as well.  Pt verbalized understanding- no other needs at this time.

## 2015-04-24 NOTE — Progress Notes (Signed)
The patient called last night to complain of itching. She had been taking Benadryl for this. I suggested she take Decadron. We will follow-up on this and consider adding Decadron to her nausea meds with subsequent cycles.

## 2015-04-27 ENCOUNTER — Telehealth: Payer: Self-pay

## 2015-04-27 NOTE — Telephone Encounter (Signed)
Patient called to update that she took the decadron 2 mg over the weekend- the itching subsided completely however patient states she is nauseated today.  She feels that she is no longer experiencing any allergic reactions.

## 2015-04-28 NOTE — Telephone Encounter (Signed)
Patient has zofran and compazine on board and knows how to use it.  She felt that the decadron caused her to feel more nauseated however the compazine seems to control the nausea.  Patient continues with the nausea even off the decadron but is controlling it with the two medications.

## 2015-04-28 NOTE — Telephone Encounter (Signed)
HOW DID WE TAKE CARE OF HER NAUSEA?

## 2015-04-30 ENCOUNTER — Ambulatory Visit (HOSPITAL_BASED_OUTPATIENT_CLINIC_OR_DEPARTMENT_OTHER): Payer: 59 | Admitting: Nurse Practitioner

## 2015-04-30 ENCOUNTER — Ambulatory Visit (HOSPITAL_BASED_OUTPATIENT_CLINIC_OR_DEPARTMENT_OTHER): Payer: 59

## 2015-04-30 ENCOUNTER — Encounter: Payer: Self-pay | Admitting: Nurse Practitioner

## 2015-04-30 ENCOUNTER — Other Ambulatory Visit (HOSPITAL_BASED_OUTPATIENT_CLINIC_OR_DEPARTMENT_OTHER): Payer: 59

## 2015-04-30 ENCOUNTER — Telehealth: Payer: Self-pay | Admitting: Nurse Practitioner

## 2015-04-30 ENCOUNTER — Ambulatory Visit: Payer: 59

## 2015-04-30 VITALS — BP 122/81 | HR 97 | Temp 98.2°F | Resp 18 | Wt 223.2 lb

## 2015-04-30 DIAGNOSIS — C50311 Malignant neoplasm of lower-inner quadrant of right female breast: Secondary | ICD-10-CM

## 2015-04-30 DIAGNOSIS — Z5111 Encounter for antineoplastic chemotherapy: Secondary | ICD-10-CM | POA: Diagnosis not present

## 2015-04-30 DIAGNOSIS — Z171 Estrogen receptor negative status [ER-]: Secondary | ICD-10-CM | POA: Diagnosis not present

## 2015-04-30 DIAGNOSIS — D630 Anemia in neoplastic disease: Secondary | ICD-10-CM

## 2015-04-30 LAB — COMPREHENSIVE METABOLIC PANEL
ALBUMIN: 3.6 g/dL (ref 3.5–5.0)
ALK PHOS: 66 U/L (ref 40–150)
ALT: 25 U/L (ref 0–55)
AST: 16 U/L (ref 5–34)
Anion Gap: 8 mEq/L (ref 3–11)
BUN: 13 mg/dL (ref 7.0–26.0)
CALCIUM: 9.4 mg/dL (ref 8.4–10.4)
CO2: 26 mEq/L (ref 22–29)
CREATININE: 0.8 mg/dL (ref 0.6–1.1)
Chloride: 103 mEq/L (ref 98–109)
EGFR: >90 mL/min/{1.73_m2} (ref >90)
Glucose: 127 mg/dl (ref 70–140)
Potassium: 3.9 mEq/L (ref 3.5–5.1)
Sodium: 137 mEq/L (ref 136–145)
Total Bilirubin: 0.55 mg/dL (ref 0.20–1.20)
Total Protein: 6.9 g/dL (ref 6.4–8.3)

## 2015-04-30 LAB — CBC WITH DIFFERENTIAL/PLATELET
BASO%: 0.3 % (ref 0.0–2.0)
Basophils Absolute: 0 10*3/uL (ref 0.0–0.1)
EOS ABS: 0 10*3/uL (ref 0.0–0.5)
EOS%: 0.3 % (ref 0.0–7.0)
HCT: 30 % — ABNORMAL LOW (ref 34.8–46.6)
HGB: 9.2 g/dL — ABNORMAL LOW (ref 11.6–15.9)
LYMPH%: 29.5 % (ref 14.0–49.7)
MCH: 27.1 pg (ref 25.1–34.0)
MCHC: 30.7 g/dL — AB (ref 31.5–36.0)
MCV: 88.2 fL (ref 79.5–101.0)
MONO#: 0.1 10*3/uL (ref 0.1–0.9)
MONO%: 3.7 % (ref 0.0–14.0)
NEUT#: 2.3 10*3/uL (ref 1.5–6.5)
NEUT%: 66.2 % (ref 38.4–76.8)
PLATELETS: 217 10*3/uL (ref 145–400)
RBC: 3.4 10*6/uL — AB (ref 3.70–5.45)
RDW: 19.2 % — ABNORMAL HIGH (ref 11.2–14.5)
WBC: 3.5 10*3/uL — ABNORMAL LOW (ref 3.9–10.3)
lymph#: 1 10*3/uL (ref 0.9–3.3)

## 2015-04-30 MED ORDER — DIPHENHYDRAMINE HCL 50 MG/ML IJ SOLN
INTRAMUSCULAR | Status: AC
  Filled 2015-04-30: qty 1

## 2015-04-30 MED ORDER — FAMOTIDINE IN NACL 20-0.9 MG/50ML-% IV SOLN
20.0000 mg | Freq: Once | INTRAVENOUS | Status: AC
  Administered 2015-04-30: 20 mg via INTRAVENOUS

## 2015-04-30 MED ORDER — SODIUM CHLORIDE 0.9 % IV SOLN
300.0000 mg | Freq: Once | INTRAVENOUS | Status: AC
  Administered 2015-04-30: 300 mg via INTRAVENOUS
  Filled 2015-04-30: qty 30

## 2015-04-30 MED ORDER — DEXAMETHASONE SODIUM PHOSPHATE 100 MG/10ML IJ SOLN
Freq: Once | INTRAMUSCULAR | Status: AC
  Administered 2015-04-30: 11:00:00 via INTRAVENOUS
  Filled 2015-04-30: qty 8

## 2015-04-30 MED ORDER — FAMOTIDINE IN NACL 20-0.9 MG/50ML-% IV SOLN
INTRAVENOUS | Status: AC
  Filled 2015-04-30: qty 50

## 2015-04-30 MED ORDER — HEPARIN SOD (PORK) LOCK FLUSH 100 UNIT/ML IV SOLN
500.0000 [IU] | Freq: Once | INTRAVENOUS | Status: AC | PRN
  Administered 2015-04-30: 500 [IU]
  Filled 2015-04-30: qty 5

## 2015-04-30 MED ORDER — SODIUM CHLORIDE 0.9 % IV SOLN
Freq: Once | INTRAVENOUS | Status: AC
  Administered 2015-04-30: 11:00:00 via INTRAVENOUS

## 2015-04-30 MED ORDER — SODIUM CHLORIDE 0.9 % IJ SOLN
10.0000 mL | INTRAMUSCULAR | Status: DC | PRN
  Administered 2015-04-30: 10 mL
  Filled 2015-04-30: qty 10

## 2015-04-30 MED ORDER — PACLITAXEL CHEMO INJECTION 300 MG/50ML
80.0000 mg/m2 | Freq: Once | INTRAVENOUS | Status: AC
  Administered 2015-04-30: 174 mg via INTRAVENOUS
  Filled 2015-04-30: qty 29

## 2015-04-30 MED ORDER — DIPHENHYDRAMINE HCL 50 MG/ML IJ SOLN
25.0000 mg | Freq: Once | INTRAMUSCULAR | Status: AC
Start: 2015-04-30 — End: 2015-04-30
  Administered 2015-04-30: 25 mg via INTRAVENOUS

## 2015-04-30 NOTE — Patient Instructions (Signed)
[facility] Discharge Instructions for Patients Receiving Chemotherapy  Today you received the following chemotherapy agents Taxol and Carboplatin. To help prevent nausea and vomiting after your treatment, we encourage you to take your nausea medication as directed.  If you develop nausea and vomiting that is not controlled by your nausea medication, call the clinic.   BELOW ARE SYMPTOMS THAT SHOULD BE REPORTED IMMEDIATELY:  *FEVER GREATER THAN 100.5 F  *CHILLS WITH OR WITHOUT FEVER  NAUSEA AND VOMITING THAT IS NOT CONTROLLED WITH YOUR NAUSEA MEDICATION  *UNUSUAL SHORTNESS OF BREATH  *UNUSUAL BRUISING OR BLEEDING  TENDERNESS IN MOUTH AND THROAT WITH OR WITHOUT PRESENCE OF ULCERS  *URINARY PROBLEMS  *BOWEL PROBLEMS  UNUSUAL RASH Items with * indicate a potential emergency and should be followed up as soon as possible.  Feel free to call the clinic you have any questions or concerns. The clinic phone number is [phone].  Please show the CHEMO ALERT CARD at check-in to the Emergency Department and triage nurse.    

## 2015-04-30 NOTE — Telephone Encounter (Signed)
Patient had appointment already made 04/29/15 for 05/22/15 9:10

## 2015-04-30 NOTE — Progress Notes (Signed)
[facility]  [phone] [phone]     ID: Susan Wilson DOB: December 02, 1975  MR#: [id number]  EBR#:[id number]  Patient Care Team: Donnamae Jude, MD as PCP - General (Obstetrics and Gynecology) Rolm Bookbinder, MD as Consulting Physician (General Surgery) Chauncey Cruel, MD as Consulting Physician (Oncology) Thea Silversmith, MD as Consulting Physician (Radiation Oncology) [name], RN as Registered Nurse Rockwell Germany, RN as Registered Nurse Holley Bouche, NP as Nurse Practitioner (Nurse Practitioner) PCP: Donnamae Jude, MD OTHER MD:  CHIEF COMPLAINT: Estrogen receptor negative breast cancer  CURRENT TREATMENT: Neoadjuvant chemotherapy  BREAST CANCER HISTORY: From the original intake note:  Susan Wilson herself noted a mass in her right breast sometime around April. Initially she thought it might be related to menstruation, but as it did not change and eventually became tender, she brought it to her physician's attention. On 11/20/2014 patient underwent bilateral diagnostic mammography with tomosynthesis and right breast ultrasonography at the breast Center. The breast density was category B. There was a hyperdense mass in the right lower inner quadrant associated with skin thickening. There was also a 5 mm nodule posteriorly at the 8:30 o'clock position in the right breast. There were several hyperdense nodules in the right axilla. On physical exam there was a firm fixed mass in the right breast at the 5:00 position. By ultrasound this was lobulated and appear to involve the skin. It measures up to 4.1 cm. There was no sonographic correlation to the 5 mm nodule seen in a different area of the right breast. The right axilla showed 3 hypervascular lymph nodes with prominent cortical thickening, measuring less than 1.5 cm.  On 11/21/2014 the patient underwent right breast biopsy (5:00 mass) and biopsy of one of the suspicious right  axillary lymph nodes. 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  The patient's subsequent history is as detailed below  INTERVAL HISTORY: Susan Wilson returns today for follow up of her triple negative breast cancer, accompanied by her husband. Today is day 1, cycle 10 of 12 weekly planned doses of carboplatin and paclitaxel. Over the weekend she experienced immense pruritus to her bilateral arms. She was started on dexamethasone which stopped the itching, but made her nauseous. She the compazine was helpful there.  REVIEW OF SYSTEMS: Vertis had some light tingling to her fingertips after treatment, but this resolved on its own. She continues to use the tea tree oil to her nailbeds due to soreness. She had diarrhea just once. Her ankles are mildly swollen from the steroid use. She denies mouth sores or rashes. A detailed review of systems is otherwise entirely negative.   PAST MEDICAL HISTORY: Past Medical History  Diagnosis Date  . Breast cancer of lower-inner quadrant of right female breast ([place]) 11/25/2014  . Breast cancer ([place])   . Hernia, umbilical   . Back pain   . Headache   . Anemia   . Hernia, umbilical   . Family history of breast cancer   . 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 Portacath placement Right 12/16/2014    Procedure: INSERTION PORT-A-CATH WITH ULTRASOUND;  Surgeon: Rolm Bookbinder, MD;  Location: [facility];  Service: General;  Laterality: Right;  FAMILY HISTORY Family History  Problem Relation Age of Onset  . Mesothelioma Maternal Grandfather     asbestos exposure, died in his 44s  . Heart defect Sister 0  . Heart disease Paternal Aunt   . Heart disease  Paternal Uncle   . Breast cancer Paternal Grandmother   . Diabetes Paternal Grandmother   . Cancer Paternal Grandfather     NOS  . Colon cancer Other 51    MGMs brother with colon cancer  . Cancer Other     several of MGF's sisters with cancer NOS  . Liver cancer Other     MGF's brother   the patient's parents are living, her father being 13 and her mother 64 as of July 2016. The patient had no brothers. One sister died at age 39 from cardiac problems. The other sister is in good health. On the maternal side there is a history of colon cancer and an uncle age 51, liver cancer in a great uncle and mesothelioma in the maternal grandfather.  GYNECOLOGIC HISTORY:  No LMP recorded. Menarche age 30. The patient is GX P2. 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 The daughters are [place] and Lovie Macadamia, age 67 and 18. The patient attends a local [place]: Not in place   HEALTH MAINTENANCE: Social History  Substance Use Topics  . Smoking status: Never Smoker   . Smokeless tobacco: Never Used  . Alcohol Use: Yes     Comment: rarely     Colonoscopy:  PAP: 2014  Bone density:  Lipid panel:  No Known Allergies  Current Outpatient Prescriptions  Medication Sig Dispense Refill  . lidocaine-prilocaine (EMLA) cream Apply topically as needed. 30 g 1  . LORazepam (ATIVAN) 0.5 MG tablet Take 1 tablet (0.5 mg total) by mouth at bedtime. 30 tablet 0  . ondansetron (ZOFRAN) 8 MG tablet TAKE 1 TABLET BY MOUTH TWICE DAILY AS NEEDED. START ON 3RD DAY AFTER CHEMOTHERAPY  0  . prochlorperazine (COMPAZINE) 10 MG tablet Take 1 tablet (10 mg total) by mouth every 6 (six) hours as needed for nausea or vomiting. 30 tablet 1  . tobramycin-dexamethasone (TOBRADEX) ophthalmic solution Place 1 drop into both eyes every 4 (four) hours while awake. 5 mL 1  .  HYDROcodone-acetaminophen (NORCO/VICODIN) 5-325 MG tablet Take 1 tablet by mouth every 6 (six) hours as needed for moderate pain. (Patient not taking: Reported on 04/16/2015) 20 tablet 0  . ibuprofen (ADVIL,MOTRIN) 200 MG tablet Take 400 mg by mouth every 6 (six) hours as needed (for pain). Reported on 04/30/2015     No current facility-administered medications for this visit.    OBJECTIVE: Young African-American woman who appears stated age  71 Vitals:   04/30/15 0941  BP: 122/81  Pulse: 97  Temp: 98.2 F (36.8 C)  Resp: 18     Body mass index is 37.14 kg/(m^2).    ECOG FS:1 - Symptomatic but completely ambulatory  Sclerae unicteric, pupils round and equal Oropharynx clear and moist-- no thrush or other lesions No cervical or supraclavicular adenopathy Lungs no rales or rhonchi Heart regular rate and rhythm Abd soft, nontender, positive bowel sounds MSK no focal spinal tenderness, no upper extremity lymphedema Neuro: nonfocal, well oriented, appropriate affect Breasts: deferred  LAB RESULTS:  CMP  CBC Latest Ref Rng 04/30/2015 04/23/2015 04/16/2015  WBC 3.9 - 10.3 10e3/uL 3.5(L) 3.1(L)  3.0(L)  Hemoglobin 11.6 - 15.9 g/dL 9.2(L) 8.8(L) 8.8(L)  Hematocrit 34.8 - 46.6 % 30.0(L) 27.8(L) 28.0(L)  Platelets 145 - 400 10e3/uL 217 232 286   CMP Latest Ref Rng 04/30/2015 04/23/2015 04/16/2015  Glucose 70 - 140 mg/dl 127 106 98  BUN 7.0 - 26.0 mg/dL 13.0 7.9 9.6  Creatinine 0.6 - 1.1 mg/dL 0.8 0.8 0.7  Sodium 136 - 145 mEq/L 137 138 138  Potassium 3.5 - 5.1 mEq/L 3.9 4.0 4.0  Chloride 101 - 111 mmol/L - - -  CO2 22 - 29 mEq/L 26 23 24   Calcium 8.4 - 10.4 mg/dL 9.4 9.3 9.5  Total Protein 6.4 - 8.3 g/dL 6.9 7.0 7.0  Total Bilirubin 0.20 - 1.20 mg/dL 0.55 0.54 0.40  Alkaline Phos 40 - 150 U/L 66 68 69  AST 5 - 34 U/L 16 41(H) 23  ALT 0 - 55 U/L 25 56(H) 38      Urinalysis    Component Value Date/Time   LABSPEC 1.020 05/06/2009 1439   PHURINE 7.0 05/06/2009 1439   GLUCOSEU  NEGATIVE 05/06/2009 1439   HGBUR NEGATIVE 05/06/2009 1439   BILIRUBINUR NEGATIVE 05/06/2009 1439   KETONESUR NEGATIVE 05/06/2009 1439   PROTEINUR NEGATIVE 05/06/2009 1439   UROBILINOGEN 0.2 05/06/2009 1439   NITRITE NEGATIVE 05/06/2009 1439   LEUKOCYTESUR  05/06/2009 1439    NEGATIVE Biochemical Testing Only. Please order routine urinalysis from main lab if confirmatory testing is needed.    STUDIES: No results found.  ASSESSMENT: 39 y.o. [place] woman s/p Right breast biopsy 11/21/2014 for a clinical T2 NX, stage 2 invasive ductal carcinoma, grade 2 or 3, estrogen and progesterone receptor negative, HER-2 equivocal, with an Mib-1 of 20%  (a) biopsy of a suspicious axillary lymph node same day was negative, but discordant  (b) review of 80 additional tumor cells by FISH still showed HER-2 not amplified; tumor should be treated as a triple negative  (1) neoadjuvant chemotherapy started 01/01/2015 consisting of cyclophosphamide and doxorubicin in dose dense fashion 4, with onpro support, completed 02/13/2015, followed by paclitaxel/ carboplatin weekly 12 started 02/26/2015   (2) genetics testing 12/15/2014 through the Breast/Ovarian gene panel offered by [facility] found no deleterious mutations in ATM, BARD1, BRCA1, BRCA2, BRIP1, CDH1, CHEK2, EPCAM, FANCC, MLH1, MSH2, MSH6, NBN, PALB2, PMS2, PTEN, RAD51C, RAD51D, TP53, and XRCC2  (3) definitive surgery  to follow chemotherapy  (4) radiation to follow surgery  PLAN: Susan Wilson has more dexamethasone on hand in case the itching returns. She has no symptoms as of now. The labs were reviewed in detail and her treatment related anemia has improved with an hgb of 9.2 today. She will proceed with cycle 10 of pacliatxel and carboplatin as planned.   I am placing a referral for a surgical consultation visit with Dr. Donne Hazel in early January.  Susan Wilson will return in 1 week for cycle 11 of treatment. She understands and agrees with this plan.  She knows the goal of treatment in her case is cure. She has been encouraged to call with any issues that might arise before her next visit here.   Laurie Panda, NP   04/30/2015 10:17 AM

## 2015-05-01 ENCOUNTER — Ambulatory Visit: Payer: 59

## 2015-05-04 ENCOUNTER — Encounter: Payer: Self-pay | Admitting: *Deleted

## 2015-05-07 ENCOUNTER — Other Ambulatory Visit: Payer: Self-pay | Admitting: Oncology

## 2015-05-07 ENCOUNTER — Other Ambulatory Visit (HOSPITAL_BASED_OUTPATIENT_CLINIC_OR_DEPARTMENT_OTHER): Payer: 59

## 2015-05-07 ENCOUNTER — Ambulatory Visit: Payer: 59

## 2015-05-07 ENCOUNTER — Encounter: Payer: Self-pay | Admitting: Nurse Practitioner

## 2015-05-07 ENCOUNTER — Ambulatory Visit (HOSPITAL_BASED_OUTPATIENT_CLINIC_OR_DEPARTMENT_OTHER): Payer: 59

## 2015-05-07 ENCOUNTER — Ambulatory Visit (HOSPITAL_BASED_OUTPATIENT_CLINIC_OR_DEPARTMENT_OTHER): Payer: 59 | Admitting: Nurse Practitioner

## 2015-05-07 ENCOUNTER — Encounter: Payer: Self-pay | Admitting: *Deleted

## 2015-05-07 VITALS — BP 119/80 | HR 98 | Temp 98.4°F | Resp 19 | Wt 225.1 lb

## 2015-05-07 DIAGNOSIS — Z5111 Encounter for antineoplastic chemotherapy: Secondary | ICD-10-CM | POA: Diagnosis not present

## 2015-05-07 DIAGNOSIS — C50311 Malignant neoplasm of lower-inner quadrant of right female breast: Secondary | ICD-10-CM

## 2015-05-07 DIAGNOSIS — Z171 Estrogen receptor negative status [ER-]: Secondary | ICD-10-CM | POA: Diagnosis not present

## 2015-05-07 LAB — CBC WITH DIFFERENTIAL/PLATELET
BASO%: 0.4 % (ref 0.0–2.0)
Basophils Absolute: 0 10*3/uL (ref 0.0–0.1)
EOS%: 0.4 % (ref 0.0–7.0)
Eosinophils Absolute: 0 10*3/uL (ref 0.0–0.5)
HCT: 28.9 % — ABNORMAL LOW (ref 34.8–46.6)
HEMOGLOBIN: 9.1 g/dL — AB (ref 11.6–15.9)
LYMPH#: 1 10*3/uL (ref 0.9–3.3)
LYMPH%: 36.5 % (ref 14.0–49.7)
MCH: 27.7 pg (ref 25.1–34.0)
MCHC: 31.5 g/dL (ref 31.5–36.0)
MCV: 87.8 fL (ref 79.5–101.0)
MONO#: 0.2 10*3/uL (ref 0.1–0.9)
MONO%: 6.2 % (ref 0.0–14.0)
NEUT%: 56.5 % (ref 38.4–76.8)
NEUTROS ABS: 1.6 10*3/uL (ref 1.5–6.5)
NRBC: 1 % — AB (ref 0–0)
Platelets: 194 10*3/uL (ref 145–400)
RBC: 3.29 10*6/uL — AB (ref 3.70–5.45)
RDW: 18.6 % — AB (ref 11.2–14.5)
WBC: 2.7 10*3/uL — AB (ref 3.9–10.3)

## 2015-05-07 LAB — COMPREHENSIVE METABOLIC PANEL
ALBUMIN: 3.8 g/dL (ref 3.5–5.0)
ALT: 65 U/L — AB (ref 0–55)
AST: 35 U/L — AB (ref 5–34)
Alkaline Phosphatase: 65 U/L (ref 40–150)
Anion Gap: 8 mEq/L (ref 3–11)
BILIRUBIN TOTAL: 0.58 mg/dL (ref 0.20–1.20)
BUN: 14.8 mg/dL (ref 7.0–26.0)
CO2: 26 meq/L (ref 22–29)
CREATININE: 0.8 mg/dL (ref 0.6–1.1)
Calcium: 9.3 mg/dL (ref 8.4–10.4)
Chloride: 105 mEq/L (ref 98–109)
GLUCOSE: 100 mg/dL (ref 70–140)
Potassium: 4.3 mEq/L (ref 3.5–5.1)
SODIUM: 139 meq/L (ref 136–145)
TOTAL PROTEIN: 7 g/dL (ref 6.4–8.3)

## 2015-05-07 MED ORDER — SODIUM CHLORIDE 0.9 % IV SOLN
300.0000 mg | Freq: Once | INTRAVENOUS | Status: AC
  Administered 2015-05-07: 300 mg via INTRAVENOUS
  Filled 2015-05-07: qty 30

## 2015-05-07 MED ORDER — FAMOTIDINE IN NACL 20-0.9 MG/50ML-% IV SOLN
INTRAVENOUS | Status: AC
  Filled 2015-05-07: qty 50

## 2015-05-07 MED ORDER — DIPHENHYDRAMINE HCL 50 MG/ML IJ SOLN
INTRAMUSCULAR | Status: AC
  Filled 2015-05-07: qty 1

## 2015-05-07 MED ORDER — DIPHENHYDRAMINE HCL 50 MG/ML IJ SOLN
25.0000 mg | Freq: Once | INTRAMUSCULAR | Status: AC
  Administered 2015-05-07: 25 mg via INTRAVENOUS

## 2015-05-07 MED ORDER — HEPARIN SOD (PORK) LOCK FLUSH 100 UNIT/ML IV SOLN
500.0000 [IU] | Freq: Once | INTRAVENOUS | Status: AC | PRN
  Administered 2015-05-07: 500 [IU]
  Filled 2015-05-07: qty 5

## 2015-05-07 MED ORDER — SODIUM CHLORIDE 0.9 % IJ SOLN
10.0000 mL | INTRAMUSCULAR | Status: DC | PRN
  Administered 2015-05-07: 10 mL
  Filled 2015-05-07: qty 10

## 2015-05-07 MED ORDER — SODIUM CHLORIDE 0.9 % IV SOLN
Freq: Once | INTRAVENOUS | Status: AC
  Administered 2015-05-07: 11:00:00 via INTRAVENOUS
  Filled 2015-05-07: qty 8

## 2015-05-07 MED ORDER — SODIUM CHLORIDE 0.9 % IV SOLN
Freq: Once | INTRAVENOUS | Status: AC
  Administered 2015-05-07: 10:00:00 via INTRAVENOUS

## 2015-05-07 MED ORDER — PACLITAXEL CHEMO INJECTION 300 MG/50ML
80.0000 mg/m2 | Freq: Once | INTRAVENOUS | Status: AC
  Administered 2015-05-07: 174 mg via INTRAVENOUS
  Filled 2015-05-07: qty 29

## 2015-05-07 MED ORDER — FAMOTIDINE IN NACL 20-0.9 MG/50ML-% IV SOLN
20.0000 mg | Freq: Once | INTRAVENOUS | Status: AC
  Administered 2015-05-07: 20 mg via INTRAVENOUS

## 2015-05-07 NOTE — Progress Notes (Signed)
[facility]  [phone]) [phone] [phone]) [phone]     ID: Susan Wilson DOB: Jul 09, 1975  MR#: [id number]  FUX#:[id number]  Patient Care Team: Donnamae Jude, MD as PCP - General (Obstetrics and Gynecology) Rolm Bookbinder, MD as Consulting Physician (General Surgery) Chauncey Cruel, MD as Consulting Physician (Oncology) Thea Silversmith, MD as Consulting Physician (Radiation Oncology) [name], RN as Registered Nurse Rockwell Germany, RN as Registered Nurse Holley Bouche, NP as Nurse Practitioner (Nurse Practitioner) PCP: Donnamae Jude, MD OTHER MD:  CHIEF COMPLAINT: Estrogen receptor negative breast cancer  CURRENT TREATMENT: Neoadjuvant chemotherapy  BREAST CANCER HISTORY: From the original intake note:  Susan Wilson herself noted a mass in her right breast sometime around April. Initially she thought it might be related to menstruation, but as it did not change and eventually became tender, she brought it to her physician's attention. On 11/20/2014 patient underwent bilateral diagnostic mammography with tomosynthesis and right breast ultrasonography at the breast Center. The breast density was category B. There was a hyperdense mass in the right lower inner quadrant associated with skin thickening. There was also a 5 mm nodule posteriorly at the 8:30 o'clock position in the right breast. There were several hyperdense nodules in the right axilla. On physical exam there was a firm fixed mass in the right breast at the 5:00 position. By ultrasound this was lobulated and appear to involve the skin. It measures up to 4.1 cm. There was no sonographic correlation to the 5 mm nodule seen in a different area of the right breast. The right axilla showed 3 hypervascular lymph nodes with prominent cortical thickening, measuring less than 1.5 cm.  On 11/21/2014 the patient underwent right breast biopsy (5:00 mass) and biopsy of one of the suspicious right  axillary lymph nodes. 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  The patient's subsequent history is as detailed below  INTERVAL HISTORY:  Susan Wilson returns today for follow up of her triple negative breast cancer, accompanied by her husband. Today is day 1, cycle 11 of 12 weekly planned doses of carboplatin and paclitaxel. She experienced none of the pruritus over the weekend that she had after the last cycle.   REVIEW OF SYSTEMS: Susan Wilson continues to have light tinging to her fingers that comes and goes. Her nailbeds are sore and she is using tea tree oil daily. Her nausea is minimal. Her stools are loose after treatment, but it works its way back to normal with time. She denies mouth sores, rashes, fevers, or chills. A detailed review of systems is otherwise entirely negative.   PAST MEDICAL HISTORY: Past Medical History  Diagnosis Date  . Breast cancer of lower-inner quadrant of right female breast ([place]) 11/25/2014  . Breast cancer ([place])   . Hernia, umbilical   . Back pain   . Headache   . Anemia   . Hernia, umbilical   . Family history of breast cancer   . 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 Portacath placement Right 12/16/2014    Procedure: INSERTION PORT-A-CATH WITH ULTRASOUND;  Surgeon: Rolm Bookbinder, MD;  Location: [facility];  Service: General;  Laterality: Right;    FAMILY HISTORY Family History  Problem  Relation Age of Onset  . Mesothelioma Maternal Grandfather     asbestos exposure, died in his 17s  . Heart defect Sister 0  . Heart disease Paternal Aunt   . Heart disease Paternal Uncle   . Breast cancer Paternal Grandmother   . Diabetes Paternal Grandmother    . Cancer Paternal Grandfather     NOS  . Colon cancer Other 67    MGMs brother with colon cancer  . Cancer Other     several of MGF's sisters with cancer NOS  . Liver cancer Other     MGF's brother   the patient's parents are living, her father being 37 and her mother 8 as of July 2016. The patient had no brothers. One sister died at age 73 from cardiac problems. The other sister is in good health. On the maternal side there is a history of colon cancer and an uncle age 47, liver cancer in a great uncle and mesothelioma in the maternal grandfather.  GYNECOLOGIC HISTORY:  No LMP recorded. Menarche age 71. The patient is GX P2. 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 The daughters are Susan Wilson and Susan Wilson, age 66 and 69. The patient attends a local [place]: Not in place   HEALTH MAINTENANCE: Social History  Substance Use Topics  . Smoking status: Never Smoker   . Smokeless tobacco: Never Used  . Alcohol Use: Yes     Comment: rarely     Colonoscopy:  PAP: 2014  Bone density:  Lipid panel:  No Known Allergies  Current Outpatient Prescriptions  Medication Sig Dispense Refill  . lidocaine-prilocaine (EMLA) cream Apply topically as needed. 30 g 1  . LORazepam (ATIVAN) 0.5 MG tablet Take 1 tablet (0.5 mg total) by mouth at bedtime. 30 tablet 0  . ondansetron (ZOFRAN) 8 MG tablet TAKE 1 TABLET BY MOUTH TWICE DAILY AS NEEDED. START ON 3RD DAY AFTER CHEMOTHERAPY  0  . tobramycin-dexamethasone (TOBRADEX) ophthalmic solution Place 1 drop into both eyes every 4 (four) hours while awake. 5 mL 1  . HYDROcodone-acetaminophen (NORCO/VICODIN) 5-325 MG tablet Take 1 tablet by mouth every 6 (six) hours as needed for moderate pain. (Patient not taking: Reported on 04/16/2015) 20 tablet 0  . ibuprofen (ADVIL,MOTRIN) 200 MG tablet Take 400 mg by  mouth every 6 (six) hours as needed (for pain). Reported on 05/07/2015    . prochlorperazine (COMPAZINE) 10 MG tablet Take 1 tablet (10 mg total) by mouth every 6 (six) hours as needed for nausea or vomiting. (Patient not taking: Reported on 05/07/2015) 30 tablet 1   No current facility-administered medications for this visit.    OBJECTIVE: Young African-American woman who appears stated age  39 Vitals:   05/07/15 0903  BP: 119/80  Pulse: 98  Temp: 98.4 F (36.9 C)  Resp: 19     Body mass index is 37.46 kg/(m^2).    ECOG FS:1 - Symptomatic but completely ambulatory  Skin: warm, dry  HEENT: sclerae anicteric, conjunctivae pink, oropharynx clear. No thrush or mucositis.  Lymph Nodes: No cervical or supraclavicular lymphadenopathy  Lungs: clear to auscultation bilaterally, no rales, wheezes, or rhonci  Heart: regular rate and rhythm  Abdomen: round, soft, non tender, positive bowel sounds  Musculoskeletal: No focal spinal tenderness, no peripheral edema  Neuro: non focal, well oriented, positive affect  Breasts: deferred   LAB RESULTS:  CMP  CBC Latest Ref Rng 05/07/2015 04/30/2015 04/23/2015  WBC 3.9 - 10.3 10e3/uL 2.7(L) 3.5(L) 3.1(L)  Hemoglobin 11.6 - 15.9 g/dL 9.1(L) 9.2(L) 8.8(L)  Hematocrit 34.8 - 46.6 % 28.9(L) 30.0(L) 27.8(L)  Platelets 145 - 400 10e3/uL 194 217 232   CMP Latest Ref Rng 05/07/2015 04/30/2015 04/23/2015  Glucose 70 - 140 mg/dl 100 127 106  BUN 7.0 - 26.0 mg/dL 14.8 13.0 7.9  Creatinine 0.6 - 1.1 mg/dL 0.8 0.8 0.8  Sodium 136 - 145 mEq/L 139 137 138  Potassium 3.5 - 5.1 mEq/L 4.3 3.9 4.0  Chloride 101 - 111 mmol/L - - -  CO2 22 - 29 mEq/L _0 Calcium 8.4 - 10.4 mg/dL 9.3 9.4 9.3  Total Protein 6.4 - 8.3 g/dL 7.0 6.9 7.0  Total Bilirubin 0.20 - 1.20 mg/dL 0.58 0.55 0.54  Alkaline Phos 40 - 150 U/L 65 66 68  AST 5 - 34 U/L 35(H) 16 41(H)  ALT 0 - 55 U/L 65(H) 25 56(H)      Urinalysis    Component Value Date/Time   LABSPEC 1.020  05/06/2009 1439   PHURINE 7.0 05/06/2009 1439   GLUCOSEU NEGATIVE 05/06/2009 1439   HGBUR NEGATIVE 05/06/2009 1439   BILIRUBINUR NEGATIVE 05/06/2009 1439   KETONESUR NEGATIVE 05/06/2009 1439   PROTEINUR NEGATIVE 05/06/2009 1439   UROBILINOGEN 0.2 05/06/2009 1439   NITRITE NEGATIVE 05/06/2009 1439   LEUKOCYTESUR  05/06/2009 1439    NEGATIVE Biochemical Testing Only. Please order routine urinalysis from main lab if confirmatory testing is needed.    STUDIES: No results found.  ASSESSMENT: 39 y.o. [place] woman s/p Right breast biopsy 11/21/2014 for a clinical T2 NX, stage 2 invasive ductal carcinoma, grade 2 or 3, estrogen and progesterone receptor negative, HER-2 equivocal, with an Mib-1 of 20%  (a) biopsy of a suspicious axillary lymph node same day was negative, but discordant  (b) review of 80 additional tumor cells by FISH still showed HER-2 not amplified; tumor should be treated as a triple negative  (1) neoadjuvant chemotherapy started 01/01/2015 consisting of cyclophosphamide and doxorubicin in dose dense fashion 4, with onpro support, completed 02/13/2015, followed by paclitaxel/ carboplatin weekly 12 started 02/26/2015   (2) genetics testing 12/15/2014 through the Breast/Ovarian gene panel offered by [facility] found no deleterious mutations in ATM, BARD1, BRCA1, BRCA2, BRIP1, CDH1, CHEK2, EPCAM, FANCC, MLH1, MSH2, MSH6, NBN, PALB2, PMS2, PTEN, RAD51C, RAD51D, TP53, and XRCC2  (3) definitive surgery  to follow chemotherapy  (4) radiation to follow surgery  PLAN: Susan Wilson has had no new issues with treatment. The labs were reviewed in detail and were entirely   She plans to meet with Dr. Donne Hazel 1 week after her breast MRI. If she has surgery in late January, she should be ready to meet with Dr. Jana Hakim by late February. I will facilitate this appointment.   Susan Wilson will return in 1 week for her 12th and final cycle of treatment. She understands and agrees with this plan.  She knows the goal of treatment in her case is cure. She has been encouraged to call with any issues that might arise before her next visit here.   Susan Panda, NP   05/07/2015 9:52 AM

## 2015-05-07 NOTE — Patient Instructions (Signed)
[facility] Discharge Instructions for Patients Receiving Chemotherapy  Today you received the following chemotherapy agents Taxol/Carboplatin To help prevent nausea and vomiting after your treatment, we encourage you to take your nausea medication as prescribed.   If you develop nausea and vomiting that is not controlled by your nausea medication, call the clinic.   BELOW ARE SYMPTOMS THAT SHOULD BE REPORTED IMMEDIATELY:  *FEVER GREATER THAN 100.5 F  *CHILLS WITH OR WITHOUT FEVER  NAUSEA AND VOMITING THAT IS NOT CONTROLLED WITH YOUR NAUSEA MEDICATION  *UNUSUAL SHORTNESS OF BREATH  *UNUSUAL BRUISING OR BLEEDING  TENDERNESS IN MOUTH AND THROAT WITH OR WITHOUT PRESENCE OF ULCERS  *URINARY PROBLEMS  *BOWEL PROBLEMS  UNUSUAL RASH Items with * indicate a potential emergency and should be followed up as soon as possible.  Feel free to call the clinic you have any questions or concerns. The clinic phone number is [phone].  Please show the [place] at check-in to the Emergency Department and triage nurse.

## 2015-05-08 ENCOUNTER — Ambulatory Visit: Payer: 59

## 2015-05-08 ENCOUNTER — Telehealth: Payer: Self-pay | Admitting: Oncology

## 2015-05-08 NOTE — Telephone Encounter (Signed)
06/2015 appointments added per pof and patient will get a new schedule 12/27

## 2015-05-14 ENCOUNTER — Encounter: Payer: Self-pay | Admitting: *Deleted

## 2015-05-14 ENCOUNTER — Other Ambulatory Visit (HOSPITAL_BASED_OUTPATIENT_CLINIC_OR_DEPARTMENT_OTHER): Payer: 59

## 2015-05-14 ENCOUNTER — Encounter: Payer: Self-pay | Admitting: Nurse Practitioner

## 2015-05-14 ENCOUNTER — Other Ambulatory Visit: Payer: Self-pay | Admitting: Hematology and Oncology

## 2015-05-14 ENCOUNTER — Ambulatory Visit (HOSPITAL_BASED_OUTPATIENT_CLINIC_OR_DEPARTMENT_OTHER): Payer: 59 | Admitting: Nurse Practitioner

## 2015-05-14 ENCOUNTER — Ambulatory Visit (HOSPITAL_BASED_OUTPATIENT_CLINIC_OR_DEPARTMENT_OTHER): Payer: 59

## 2015-05-14 ENCOUNTER — Telehealth: Payer: Self-pay | Admitting: Nurse Practitioner

## 2015-05-14 ENCOUNTER — Ambulatory Visit: Payer: 59

## 2015-05-14 VITALS — BP 120/83 | HR 101 | Temp 98.2°F | Resp 18 | Ht 65.0 in | Wt 223.9 lb

## 2015-05-14 DIAGNOSIS — Z5111 Encounter for antineoplastic chemotherapy: Secondary | ICD-10-CM | POA: Diagnosis not present

## 2015-05-14 DIAGNOSIS — D701 Agranulocytosis secondary to cancer chemotherapy: Secondary | ICD-10-CM | POA: Insufficient documentation

## 2015-05-14 DIAGNOSIS — C50311 Malignant neoplasm of lower-inner quadrant of right female breast: Secondary | ICD-10-CM

## 2015-05-14 DIAGNOSIS — T451X5A Adverse effect of antineoplastic and immunosuppressive drugs, initial encounter: Secondary | ICD-10-CM

## 2015-05-14 DIAGNOSIS — D709 Neutropenia, unspecified: Secondary | ICD-10-CM

## 2015-05-14 LAB — CBC WITH DIFFERENTIAL/PLATELET
BASO%: 0.5 % (ref 0.0–2.0)
BASOS ABS: 0 10*3/uL (ref 0.0–0.1)
EOS ABS: 0 10*3/uL (ref 0.0–0.5)
EOS%: 1.4 % (ref 0.0–7.0)
HEMATOCRIT: 28.7 % — AB (ref 34.8–46.6)
HGB: 9 g/dL — ABNORMAL LOW (ref 11.6–15.9)
LYMPH#: 1 10*3/uL (ref 0.9–3.3)
LYMPH%: 46.9 % (ref 14.0–49.7)
MCH: 27.8 pg (ref 25.1–34.0)
MCHC: 31.4 g/dL — AB (ref 31.5–36.0)
MCV: 88.6 fL (ref 79.5–101.0)
MONO#: 0.1 10*3/uL (ref 0.1–0.9)
MONO%: 3.3 % (ref 0.0–14.0)
NEUT#: 1 10*3/uL — ABNORMAL LOW (ref 1.5–6.5)
NEUT%: 47.9 % (ref 38.4–76.8)
PLATELETS: 166 10*3/uL (ref 145–400)
RBC: 3.24 10*6/uL — AB (ref 3.70–5.45)
RDW: 18.3 % — ABNORMAL HIGH (ref 11.2–14.5)
WBC: 2.1 10*3/uL — ABNORMAL LOW (ref 3.9–10.3)

## 2015-05-14 LAB — COMPREHENSIVE METABOLIC PANEL
ALT: 40 U/L (ref 0–55)
ANION GAP: 7 meq/L (ref 3–11)
AST: 18 U/L (ref 5–34)
Albumin: 3.8 g/dL (ref 3.5–5.0)
Alkaline Phosphatase: 64 U/L (ref 40–150)
BUN: 12.1 mg/dL (ref 7.0–26.0)
CALCIUM: 9 mg/dL (ref 8.4–10.4)
CHLORIDE: 106 meq/L (ref 98–109)
CO2: 26 meq/L (ref 22–29)
CREATININE: 0.8 mg/dL (ref 0.6–1.1)
Glucose: 124 mg/dl (ref 70–140)
Potassium: 4.1 mEq/L (ref 3.5–5.1)
Sodium: 138 mEq/L (ref 136–145)
Total Bilirubin: 0.6 mg/dL (ref 0.20–1.20)
Total Protein: 7.1 g/dL (ref 6.4–8.3)

## 2015-05-14 MED ORDER — FAMOTIDINE IN NACL 20-0.9 MG/50ML-% IV SOLN
INTRAVENOUS | Status: AC
  Filled 2015-05-14: qty 50

## 2015-05-14 MED ORDER — DIPHENHYDRAMINE HCL 50 MG/ML IJ SOLN
INTRAMUSCULAR | Status: AC
  Filled 2015-05-14: qty 1

## 2015-05-14 MED ORDER — PACLITAXEL CHEMO INJECTION 300 MG/50ML
65.0000 mg/m2 | Freq: Once | INTRAVENOUS | Status: AC
  Administered 2015-05-14: 138 mg via INTRAVENOUS
  Filled 2015-05-14: qty 23

## 2015-05-14 MED ORDER — FAMOTIDINE IN NACL 20-0.9 MG/50ML-% IV SOLN
20.0000 mg | Freq: Once | INTRAVENOUS | Status: AC
  Administered 2015-05-14: 20 mg via INTRAVENOUS

## 2015-05-14 MED ORDER — SODIUM CHLORIDE 0.9 % IV SOLN
Freq: Once | INTRAVENOUS | Status: AC
  Administered 2015-05-14: 11:00:00 via INTRAVENOUS

## 2015-05-14 MED ORDER — SODIUM CHLORIDE 0.9 % IJ SOLN
10.0000 mL | INTRAMUSCULAR | Status: DC | PRN
  Administered 2015-05-14: 10 mL
  Filled 2015-05-14: qty 10

## 2015-05-14 MED ORDER — SODIUM CHLORIDE 0.9 % IV SOLN
300.0000 mg | Freq: Once | INTRAVENOUS | Status: AC
  Administered 2015-05-14: 300 mg via INTRAVENOUS
  Filled 2015-05-14: qty 30

## 2015-05-14 MED ORDER — SODIUM CHLORIDE 0.9 % IV SOLN
Freq: Once | INTRAVENOUS | Status: AC
  Administered 2015-05-14: 11:00:00 via INTRAVENOUS
  Filled 2015-05-14: qty 8

## 2015-05-14 MED ORDER — HEPARIN SOD (PORK) LOCK FLUSH 100 UNIT/ML IV SOLN
500.0000 [IU] | Freq: Once | INTRAVENOUS | Status: AC | PRN
  Administered 2015-05-14: 500 [IU]
  Filled 2015-05-14: qty 5

## 2015-05-14 MED ORDER — DIPHENHYDRAMINE HCL 50 MG/ML IJ SOLN
25.0000 mg | Freq: Once | INTRAMUSCULAR | Status: AC
  Administered 2015-05-14: 25 mg via INTRAVENOUS

## 2015-05-14 NOTE — Progress Notes (Signed)
Okay to treat despite Neutrophils = 0.8 today, per Nira Conn, NP.  Patient to receive Neupogen tomorrow.

## 2015-05-14 NOTE — Progress Notes (Signed)
Susan Wilson  [phone] [phone]     ID: Susan Wilson DOB: November 07, 1975  MR#: [id number]  RSW#:[id number]  Patient Care Team: Donnamae Jude, MD as PCP - General (Obstetrics and Gynecology) Rolm Bookbinder, MD as Consulting Physician (General Surgery) Chauncey Cruel, MD as Consulting Physician (Oncology) Thea Silversmith, MD as Consulting Physician (Radiation Oncology) [name], RN as Registered Nurse Rockwell Germany, RN as Registered Nurse Holley Bouche, NP as Nurse Practitioner (Nurse Practitioner) PCP: Donnamae Jude, MD OTHER MD:  CHIEF COMPLAINT: Estrogen receptor negative breast cancer  CURRENT TREATMENT: Neoadjuvant chemotherapy  BREAST CANCER HISTORY: From the original intake note:  Susan Wilson herself noted a mass in her right breast sometime around April. Initially she thought it might be related to menstruation, but as it did not change and eventually became tender, she brought it to her physician's attention. On 11/20/2014 patient underwent bilateral diagnostic mammography with tomosynthesis and right breast ultrasonography at the breast Center. The breast density was category B. There was a hyperdense mass in the right lower inner quadrant associated with skin thickening. There was also a 5 mm nodule posteriorly at the 8:30 o'clock position in the right breast. There were several hyperdense nodules in the right axilla. On physical exam there was a firm fixed mass in the right breast at the 5:00 position. By ultrasound this was lobulated and appear to involve the skin. It measures up to 4.1 cm. There was no sonographic correlation to the 5 mm nodule seen in a different area of the right breast. The right axilla showed 3 hypervascular lymph nodes with prominent cortical thickening, measuring less than 1.5 cm.  On 11/21/2014 the patient underwent right breast biopsy (5:00 mass) and biopsy of one of the suspicious right  axillary lymph nodes. 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  The patient's subsequent history is as detailed below  INTERVAL HISTORY:  Susan Wilson returns today for follow up of her triple negative breast cancer, accompanied by her sister. Today is day 1, cycle 12 of 12 weekly planned doses of carboplatin and paclitaxel.   REVIEW OF SYSTEMS: Susan Wilson had a few headaches over the past week, but they resolved with aleve. Otherwise there have been no changes. She continues to have light tinging to her fingers that comes and goes. Her nailbeds are sore and she is using tea tree oil daily. Her nausea is minimal. Her stools are loose after treatment, but it works its way back to normal with time. She denies mouth sores, rashes, fevers, or chills. A detailed review of systems is otherwise entirely negative.   PAST MEDICAL HISTORY: Past Medical History  Diagnosis Date  . Breast cancer of lower-inner quadrant of right female breast ([place]) 11/25/2014  . Breast cancer ([place])   . Hernia, umbilical   . Back pain   . Headache   . Anemia   . Hernia, umbilical   . Family history of breast cancer   . 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 Portacath placement Right 12/16/2014    Procedure: INSERTION PORT-A-CATH WITH ULTRASOUND;  Surgeon: Rolm Bookbinder, MD;  [place];  Service: General;  Laterality: Right;    FAMILY HISTORY  Family History  Problem Relation Age of Onset  . Mesothelioma Maternal Grandfather     asbestos exposure, died in his 89s  . Heart defect Sister 0  . Heart disease Paternal Aunt   . Heart disease Paternal Uncle   . Breast cancer Paternal Grandmother   . Diabetes  Paternal Grandmother   . Cancer Paternal Grandfather     NOS  . Colon cancer Other 65    MGMs brother with colon cancer  . Cancer Other     several of MGF's sisters with cancer NOS  . Liver cancer Other     MGF's brother   the patient's parents are living, her father being 107 and her mother 22 as of July 2016. The patient had no brothers. One sister died at age 34 from cardiac problems. The other sister is in good health. On the maternal side there is a history of colon cancer and an uncle age 51, liver cancer in a great uncle and mesothelioma in the maternal grandfather.  GYNECOLOGIC HISTORY:  No LMP recorded. Menarche age 15. The patient is GX P2. 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 The daughters are Susan Wilson and Susan Wilson, age 53 and 9. The patient attends a local [employer]: Not in place   HEALTH MAINTENANCE: Social History  Substance Use Topics  . Smoking status: Never Smoker   . Smokeless tobacco: Never Used  . Alcohol Use: Yes     Comment: rarely     Colonoscopy:  PAP: 2014  Bone density:  Lipid panel:  No Known Allergies  Current Outpatient Prescriptions  Medication Sig Dispense Refill  . lidocaine-prilocaine (EMLA) cream Apply topically as needed. 30 g 1  . LORazepam (ATIVAN) 0.5 MG tablet Take 1 tablet (0.5 mg total) by mouth at bedtime. 30 tablet 0  . ondansetron (ZOFRAN) 8 MG tablet TAKE 1 TABLET BY MOUTH TWICE DAILY AS NEEDED. START ON 3RD DAY AFTER CHEMOTHERAPY  0  . tobramycin-dexamethasone (TOBRADEX) ophthalmic solution Place 1 drop into both eyes every 4 (four) hours while awake. 5 mL 1  . HYDROcodone-acetaminophen (NORCO/VICODIN) 5-325 MG tablet Take 1 tablet by mouth every 6 (six) hours as needed for moderate pain. (Patient not taking: Reported on 04/16/2015) 20 tablet 0  . ibuprofen (ADVIL,MOTRIN) 200 MG  tablet Take 400 mg by mouth every 6 (six) hours as needed (for pain). Reported on 05/14/2015    . prochlorperazine (COMPAZINE) 10 MG tablet Take 1 tablet (10 mg total) by mouth every 6 (six) hours as needed for nausea or vomiting. (Patient not taking: Reported on 05/07/2015) 30 tablet 1   No current facility-administered medications for this visit.    OBJECTIVE: Young African-American woman who appears stated age  5 Vitals:   05/14/15 0921  BP: 120/83  Pulse: 101  Temp: 98.2 F (36.8 C)  Resp: 18     Body mass index is 37.26 kg/(m^2).    ECOG FS:1 - Symptomatic but completely ambulatory  Skin: warm, dry  HEENT: sclerae anicteric, conjunctivae pink, oropharynx clear. No thrush or mucositis.  Lymph Nodes: No cervical or supraclavicular lymphadenopathy  Lungs: clear to auscultation bilaterally, no rales, wheezes, or rhonci  Heart: regular rate and rhythm  Abdomen: round, soft, non tender, positive bowel sounds  Musculoskeletal: No focal spinal tenderness, no peripheral edema  Neuro: non focal, well oriented, positive affect  Breasts: deferred  LAB RESULTS:  CMP  CBC Latest Ref Rng 05/14/2015 05/07/2015 04/30/2015  WBC 3.9 - 10.3 10e3/uL 2.1(L) 2.7(L) 3.5(L)  Hemoglobin 11.6 - 15.9 g/dL 9.0(L) 9.1(L) 9.2(L)  Hematocrit 34.8 - 46.6 % 28.7(L) 28.9(L) 30.0(L)  Platelets 145 - 400 10e3/uL 166 194 217   CMP Latest Ref Rng 05/14/2015 05/07/2015 04/30/2015  Glucose 70 - 140 mg/dl 124 100 127  BUN 7.0 - 26.0 mg/dL 12.1 14.8 13.0  Creatinine 0.6 - 1.1 mg/dL 0.8 0.8 0.8  Sodium 136 - 145 mEq/L 138 139 137  Potassium 3.5 - 5.1 mEq/L 4.1 4.3 3.9  Chloride 101 - 111 mmol/L - - -  CO2 22 - 29 mEq/L _0 Calcium 8.4 - 10.4 mg/dL 9.0 9.3 9.4  Total Protein 6.4 - 8.3 g/dL 7.1 7.0 6.9  Total Bilirubin 0.20 - 1.20 mg/dL 0.60 0.58 0.55  Alkaline Phos 40 - 150 U/L 64 65 66  AST 5 - 34 U/L 18 35(H) 16  ALT 0 - 55 U/L 40 65(H) 25      Urinalysis    Component Value Date/Time    LABSPEC 1.020 05/06/2009 1439   PHURINE 7.0 05/06/2009 1439   GLUCOSEU NEGATIVE 05/06/2009 1439   HGBUR NEGATIVE 05/06/2009 1439   BILIRUBINUR NEGATIVE 05/06/2009 1439   KETONESUR NEGATIVE 05/06/2009 1439   PROTEINUR NEGATIVE 05/06/2009 1439   UROBILINOGEN 0.2 05/06/2009 1439   NITRITE NEGATIVE 05/06/2009 1439   LEUKOCYTESUR  05/06/2009 1439    NEGATIVE Biochemical Testing Only. Please order routine urinalysis from main lab if confirmatory testing is needed.    STUDIES: No results found.  ASSESSMENT: 38 y.o. [place] woman s/p Right breast biopsy 11/21/2014 for a clinical T2 NX, stage 2 invasive ductal carcinoma, grade 2 or 3, estrogen and progesterone receptor negative, HER-2 equivocal, with an Mib-1 of 20%  (a) biopsy of a suspicious axillary lymph node same day was negative, but discordant  (b) review of 80 additional tumor cells by FISH still showed HER-2 not amplified; tumor should be treated as a triple negative  (1) neoadjuvant chemotherapy started 01/01/2015 consisting of cyclophosphamide and doxorubicin in dose dense fashion 4, with onpro support, completed 02/13/2015, followed by paclitaxel/ carboplatin weekly 12 started 02/26/2015   (2) genetics testing 12/15/2014 through the Breast/Ovarian gene panel offered by [facility] found no deleterious mutations in ATM, BARD1, BRCA1, BRCA2, BRIP1, CDH1, CHEK2, EPCAM, FANCC, MLH1, MSH2, MSH6, NBN, PALB2, PMS2, PTEN, RAD51C, RAD51D, TP53, and XRCC2  (3) definitive surgery  to follow chemotherapy  (4) radiation to follow surgery  PLAN: The labs were reviewed in detail and Livia is neutropenic for the first time on this regimen with an ANC of 1.0. I consulted with Dr. Lindi Adie. He suggested proceed with her 12th and final cycle of paclitaxel and carboplatin, but with a dose reduction and granulocyte support. We are dropping the paclitaxel to 28m/mg2 and she will return for neupogen tomorrow and Tuesday 1/3. This was discussed with  Rilei and she is in agreement.   Winda will have her final breast MRI tomorrow and meets with Dr. WDonne Hazelin early January. She will return for follow up with Dr. MJana Hakimin February. She understands and agrees with this plan. She knows the goal of treatment in her case is cure. She has been encouraged to call with any issues that might arise before her next visit here.   HLaurie Panda NP   05/14/2015 10:05 AM

## 2015-05-14 NOTE — Telephone Encounter (Signed)
Appointments made and avs printed for patient °

## 2015-05-14 NOTE — Patient Instructions (Addendum)
[facility] Discharge Instructions for Patients Receiving Chemotherapy  Today you received the following chemotherapy agents Taxol and Carboplatin  To help prevent nausea and vomiting after your treatment, we encourage you to take your nausea medication     If you develop nausea and vomiting that is not controlled by your nausea medication, call the clinic.   BELOW ARE SYMPTOMS THAT SHOULD BE REPORTED IMMEDIATELY:  *FEVER GREATER THAN 100.5 F  *CHILLS WITH OR WITHOUT FEVER  NAUSEA AND VOMITING THAT IS NOT CONTROLLED WITH YOUR NAUSEA MEDICATION  *UNUSUAL SHORTNESS OF BREATH  *UNUSUAL BRUISING OR BLEEDING  TENDERNESS IN MOUTH AND THROAT WITH OR WITHOUT PRESENCE OF ULCERS  *URINARY PROBLEMS  *BOWEL PROBLEMS  UNUSUAL RASH Items with * indicate a potential emergency and should be followed up as soon as possible.  Feel free to call the clinic you have any questions or concerns. The clinic phone number is [phone].  Please show the [facility] at check-in to the Emergency Department and triage nurse.

## 2015-05-15 ENCOUNTER — Ambulatory Visit: Payer: 59

## 2015-05-15 ENCOUNTER — Ambulatory Visit
Admission: RE | Admit: 2015-05-15 | Discharge: 2015-05-15 | Disposition: A | Discharge status: Home / Self Care | Payer: 59 | Source: Ambulatory Visit | Attending: Nurse Practitioner | Admitting: Nurse Practitioner

## 2015-05-15 ENCOUNTER — Ambulatory Visit (HOSPITAL_BASED_OUTPATIENT_CLINIC_OR_DEPARTMENT_OTHER): Payer: 59

## 2015-05-15 ENCOUNTER — Other Ambulatory Visit: Payer: Self-pay | Admitting: *Deleted

## 2015-05-15 VITALS — BP 138/82 | HR 108 | Temp 98.4°F

## 2015-05-15 DIAGNOSIS — C50311 Malignant neoplasm of lower-inner quadrant of right female breast: Secondary | ICD-10-CM

## 2015-05-15 DIAGNOSIS — T451X5A Adverse effect of antineoplastic and immunosuppressive drugs, initial encounter: Principal | ICD-10-CM

## 2015-05-15 DIAGNOSIS — D701 Agranulocytosis secondary to cancer chemotherapy: Secondary | ICD-10-CM | POA: Diagnosis not present

## 2015-05-15 MED ORDER — DEXAMETHASONE 4 MG PO TABS: 4.0000 mg | ORAL_TABLET | Freq: Two times a day (BID) | ORAL | Status: DC

## 2015-05-15 MED ORDER — GADOBENATE DIMEGLUMINE 529 MG/ML IV SOLN
20.0000 mL | Freq: Once | INTRAVENOUS | Status: AC | PRN
  Administered 2015-05-15: 20 mL via INTRAVENOUS

## 2015-05-15 MED ORDER — TBO-FILGRASTIM 300 MCG/0.5ML ~~LOC~~ SOSY
300.0000 ug | PREFILLED_SYRINGE | Freq: Once | SUBCUTANEOUS | Status: AC
  Administered 2015-05-15: 300 ug via SUBCUTANEOUS
  Filled 2015-05-15: qty 0.5

## 2015-05-15 MED ORDER — TOBRAMYCIN-DEXAMETHASONE 0.3-0.1 % OP SUSP: 1.0000 [drp] | OPHTHALMIC | Status: DC

## 2015-05-15 NOTE — Patient Instructions (Signed)

## 2015-05-17 DIAGNOSIS — C50919 Malignant neoplasm of unspecified site of unspecified female breast: Secondary | ICD-10-CM

## 2015-05-17 DIAGNOSIS — Z923 Personal history of irradiation: Secondary | ICD-10-CM

## 2015-05-17 DIAGNOSIS — Z9221 Personal history of antineoplastic chemotherapy: Secondary | ICD-10-CM

## 2015-05-17 HISTORY — DX: Personal history of irradiation: Z92.3

## 2015-05-17 HISTORY — PX: BREAST LUMPECTOMY: SHX2

## 2015-05-17 HISTORY — DX: Malignant neoplasm of unspecified site of unspecified female breast: C50.919

## 2015-05-17 HISTORY — PX: REDUCTION MAMMAPLASTY: SUR839

## 2015-05-17 HISTORY — DX: Personal history of antineoplastic chemotherapy: Z92.21

## 2015-05-19 ENCOUNTER — Other Ambulatory Visit: Payer: Self-pay | Admitting: Nurse Practitioner

## 2015-05-19 ENCOUNTER — Ambulatory Visit (HOSPITAL_BASED_OUTPATIENT_CLINIC_OR_DEPARTMENT_OTHER): Payer: 59

## 2015-05-19 VITALS — BP 122/83 | HR 89 | Temp 98.9°F

## 2015-05-19 DIAGNOSIS — T451X5A Adverse effect of antineoplastic and immunosuppressive drugs, initial encounter: Secondary | ICD-10-CM

## 2015-05-19 DIAGNOSIS — C50311 Malignant neoplasm of lower-inner quadrant of right female breast: Secondary | ICD-10-CM

## 2015-05-19 DIAGNOSIS — D701 Agranulocytosis secondary to cancer chemotherapy: Secondary | ICD-10-CM

## 2015-05-19 MED ORDER — TBO-FILGRASTIM 300 MCG/0.5ML ~~LOC~~ SOSY
300.0000 ug | PREFILLED_SYRINGE | Freq: Once | SUBCUTANEOUS | Status: AC
  Administered 2015-05-19: 300 ug via SUBCUTANEOUS
  Filled 2015-05-19: qty 0.5

## 2015-05-29 ENCOUNTER — Encounter: Payer: Self-pay | Admitting: *Deleted

## 2015-06-05 ENCOUNTER — Other Ambulatory Visit: Payer: Self-pay | Admitting: General Surgery

## 2015-06-05 DIAGNOSIS — C50311 Malignant neoplasm of lower-inner quadrant of right female breast: Secondary | ICD-10-CM

## 2015-06-09 ENCOUNTER — Other Ambulatory Visit: Payer: Self-pay | Admitting: General Surgery

## 2015-06-09 DIAGNOSIS — C50311 Malignant neoplasm of lower-inner quadrant of right female breast: Secondary | ICD-10-CM

## 2015-06-11 ENCOUNTER — Encounter (HOSPITAL_BASED_OUTPATIENT_CLINIC_OR_DEPARTMENT_OTHER): Payer: Self-pay | Admitting: *Deleted

## 2015-06-12 ENCOUNTER — Ambulatory Visit
Admission: RE | Admit: 2015-06-12 | Discharge: 2015-06-12 | Disposition: A | Discharge status: Home / Self Care | Payer: 59 | Source: Ambulatory Visit | Attending: General Surgery | Admitting: General Surgery

## 2015-06-12 DIAGNOSIS — C50311 Malignant neoplasm of lower-inner quadrant of right female breast: Secondary | ICD-10-CM

## 2015-06-15 ENCOUNTER — Ambulatory Visit (HOSPITAL_COMMUNITY)
Admission: RE | Admit: 2015-06-15 | Discharge: 2015-06-15 | Disposition: A | Discharge status: Home / Self Care | Payer: 59 | Source: Ambulatory Visit | Attending: General Surgery | Admitting: General Surgery

## 2015-06-15 ENCOUNTER — Ambulatory Visit (HOSPITAL_BASED_OUTPATIENT_CLINIC_OR_DEPARTMENT_OTHER): Payer: 59 | Admitting: Anesthesiology

## 2015-06-15 ENCOUNTER — Ambulatory Visit (HOSPITAL_BASED_OUTPATIENT_CLINIC_OR_DEPARTMENT_OTHER)
Admission: RE | Admit: 2015-06-15 | Discharge: 2015-06-15 | Disposition: A | Discharge status: Home / Self Care | Payer: 59 | Source: Ambulatory Visit | Attending: General Surgery | Admitting: General Surgery

## 2015-06-15 ENCOUNTER — Ambulatory Visit
Admission: RE | Admit: 2015-06-15 | Discharge: 2015-06-15 | Disposition: A | Discharge status: Home / Self Care | Payer: 59 | Source: Ambulatory Visit | Attending: General Surgery | Admitting: General Surgery

## 2015-06-15 ENCOUNTER — Encounter (HOSPITAL_BASED_OUTPATIENT_CLINIC_OR_DEPARTMENT_OTHER): Payer: Self-pay | Admitting: *Deleted

## 2015-06-15 ENCOUNTER — Encounter (HOSPITAL_BASED_OUTPATIENT_CLINIC_OR_DEPARTMENT_OTHER)
Admission: RE | Disposition: A | Discharge status: Home / Self Care | Payer: Self-pay | Source: Ambulatory Visit | Attending: General Surgery

## 2015-06-15 DIAGNOSIS — C50911 Malignant neoplasm of unspecified site of right female breast: Secondary | ICD-10-CM | POA: Diagnosis present

## 2015-06-15 DIAGNOSIS — Z6839 Body mass index (BMI) 39.0-39.9, adult: Secondary | ICD-10-CM | POA: Insufficient documentation

## 2015-06-15 DIAGNOSIS — C50311 Malignant neoplasm of lower-inner quadrant of right female breast: Secondary | ICD-10-CM

## 2015-06-15 DIAGNOSIS — Z9221 Personal history of antineoplastic chemotherapy: Secondary | ICD-10-CM | POA: Insufficient documentation

## 2015-06-15 DIAGNOSIS — Z79899 Other long term (current) drug therapy: Secondary | ICD-10-CM | POA: Diagnosis not present

## 2015-06-15 DIAGNOSIS — Z171 Estrogen receptor negative status [ER-]: Secondary | ICD-10-CM | POA: Diagnosis not present

## 2015-06-15 HISTORY — PX: RADIOACTIVE SEED GUIDED PARTIAL MASTECTOMY WITH AXILLARY SENTINEL LYMPH NODE BIOPSY: SHX6520

## 2015-06-15 SURGERY — RADIOACTIVE SEED GUIDED PARTIAL MASTECTOMY WITH AXILLARY SENTINEL LYMPH NODE BIOPSY
Anesthesia: Regional | Site: Breast | Laterality: Right

## 2015-06-15 MED ORDER — HYDROCODONE-ACETAMINOPHEN 5-325 MG PO TABS
ORAL_TABLET | ORAL | Status: AC
  Filled 2015-06-15: qty 1

## 2015-06-15 MED ORDER — GLYCOPYRROLATE 0.2 MG/ML IJ SOLN: 0.2000 mg | Freq: Once | INTRAMUSCULAR | Status: DC | PRN

## 2015-06-15 MED ORDER — HYDROCODONE-ACETAMINOPHEN 7.5-325 MG PO TABS
1.0000 | ORAL_TABLET | Freq: Once | ORAL | Status: DC | PRN
Start: 2015-06-15 — End: 2015-06-15

## 2015-06-15 MED ORDER — HYDROMORPHONE HCL 1 MG/ML IJ SOLN
INTRAMUSCULAR | Status: AC
Start: 2015-06-15 — End: 2015-06-15
  Filled 2015-06-15: qty 1

## 2015-06-15 MED ORDER — DEXAMETHASONE SODIUM PHOSPHATE 10 MG/ML IJ SOLN
INTRAMUSCULAR | Status: AC
  Filled 2015-06-15: qty 1

## 2015-06-15 MED ORDER — ONDANSETRON HCL 4 MG/2ML IJ SOLN
INTRAMUSCULAR | Status: DC | PRN
  Administered 2015-06-15: 4 mg via INTRAVENOUS

## 2015-06-15 MED ORDER — KETOROLAC TROMETHAMINE 30 MG/ML IJ SOLN: 30.0000 mg | Freq: Once | INTRAMUSCULAR | Status: DC

## 2015-06-15 MED ORDER — CEFAZOLIN SODIUM-DEXTROSE 2-3 GM-% IV SOLR
INTRAVENOUS | Status: AC
  Filled 2015-06-15: qty 50

## 2015-06-15 MED ORDER — SODIUM CHLORIDE 0.9 % IJ SOLN
INTRAMUSCULAR | Status: DC | PRN
  Administered 2015-06-15: 12:00:00

## 2015-06-15 MED ORDER — LIDOCAINE HCL (CARDIAC) 20 MG/ML IV SOLN
INTRAVENOUS | Status: AC
  Filled 2015-06-15: qty 5

## 2015-06-15 MED ORDER — LACTATED RINGERS IV SOLN
INTRAVENOUS | Status: DC
  Administered 2015-06-15: 12:00:00 via INTRAVENOUS

## 2015-06-15 MED ORDER — OXYCODONE-ACETAMINOPHEN 10-325 MG PO TABS: 1.0000 | ORAL_TABLET | Freq: Four times a day (QID) | ORAL | Status: DC | PRN

## 2015-06-15 MED ORDER — MIDAZOLAM HCL 2 MG/2ML IJ SOLN
INTRAMUSCULAR | Status: AC
  Filled 2015-06-15: qty 2

## 2015-06-15 MED ORDER — FENTANYL CITRATE (PF) 100 MCG/2ML IJ SOLN
INTRAMUSCULAR | Status: AC
  Filled 2015-06-15: qty 2

## 2015-06-15 MED ORDER — PROPOFOL 10 MG/ML IV BOLUS
INTRAVENOUS | Status: DC | PRN
  Administered 2015-06-15: 20 mg via INTRAVENOUS
  Administered 2015-06-15: 200 mg via INTRAVENOUS

## 2015-06-15 MED ORDER — BUPIVACAINE-EPINEPHRINE (PF) 0.5% -1:200000 IJ SOLN
INTRAMUSCULAR | Status: DC | PRN
  Administered 2015-06-15: 30 mL

## 2015-06-15 MED ORDER — CEFAZOLIN SODIUM-DEXTROSE 2-3 GM-% IV SOLR
2.0000 g | INTRAVENOUS | Status: AC
  Administered 2015-06-15: 2 g via INTRAVENOUS

## 2015-06-15 MED ORDER — PROPOFOL 10 MG/ML IV BOLUS
INTRAVENOUS | Status: AC
  Filled 2015-06-15: qty 20

## 2015-06-15 MED ORDER — TECHNETIUM TC 99M SULFUR COLLOID FILTERED
1.0000 | Freq: Once | INTRAVENOUS | Status: AC | PRN
  Administered 2015-06-15: 1 via INTRADERMAL

## 2015-06-15 MED ORDER — MIDAZOLAM HCL 2 MG/2ML IJ SOLN
1.0000 mg | INTRAMUSCULAR | Status: DC | PRN
  Administered 2015-06-15 (×2): 2 mg via INTRAVENOUS

## 2015-06-15 MED ORDER — DEXAMETHASONE SODIUM PHOSPHATE 4 MG/ML IJ SOLN
INTRAMUSCULAR | Status: DC | PRN
  Administered 2015-06-15: 10 mg via INTRAVENOUS

## 2015-06-15 MED ORDER — HEPARIN SOD (PORK) LOCK FLUSH 100 UNIT/ML IV SOLN
500.0000 [IU] | INTRAVENOUS | Status: AC | PRN
  Administered 2015-06-15: 500 [IU]

## 2015-06-15 MED ORDER — BUPIVACAINE HCL (PF) 0.25 % IJ SOLN
INTRAMUSCULAR | Status: DC | PRN
Start: 2015-06-15 — End: 2015-06-15
  Administered 2015-06-15: 5 mL

## 2015-06-15 MED ORDER — HYDROCODONE-ACETAMINOPHEN 5-325 MG PO TABS
1.0000 | ORAL_TABLET | Freq: Four times a day (QID) | ORAL | Status: DC | PRN
  Administered 2015-06-15: 1 via ORAL

## 2015-06-15 MED ORDER — ONDANSETRON HCL 4 MG/2ML IJ SOLN
INTRAMUSCULAR | Status: AC
  Filled 2015-06-15: qty 2

## 2015-06-15 MED ORDER — PROMETHAZINE HCL 25 MG/ML IJ SOLN: 6.2500 mg | INTRAMUSCULAR | Status: DC | PRN

## 2015-06-15 MED ORDER — SCOPOLAMINE 1 MG/3DAYS TD PT72
1.0000 | MEDICATED_PATCH | Freq: Once | TRANSDERMAL | Status: DC | PRN
Start: 2015-06-15 — End: 2015-06-15

## 2015-06-15 MED ORDER — FENTANYL CITRATE (PF) 100 MCG/2ML IJ SOLN
50.0000 ug | INTRAMUSCULAR | Status: AC | PRN
  Administered 2015-06-15: 50 ug via INTRAVENOUS
  Administered 2015-06-15: 100 ug via INTRAVENOUS
  Administered 2015-06-15 (×3): 25 ug via INTRAVENOUS

## 2015-06-15 MED ORDER — HYDROMORPHONE HCL 1 MG/ML IJ SOLN
0.2500 mg | INTRAMUSCULAR | Status: DC | PRN
  Administered 2015-06-15 (×3): 0.5 mg via INTRAVENOUS

## 2015-06-15 MED ORDER — HYDROMORPHONE HCL 1 MG/ML IJ SOLN
INTRAMUSCULAR | Status: AC
  Filled 2015-06-15: qty 1

## 2015-06-15 SURGICAL SUPPLY — 61 items
APPLIER CLIP 9.375 MED OPEN (MISCELLANEOUS) ×3
BINDER BREAST LRG (GAUZE/BANDAGES/DRESSINGS) IMPLANT
BINDER BREAST MEDIUM (GAUZE/BANDAGES/DRESSINGS) IMPLANT
BINDER BREAST XLRG (GAUZE/BANDAGES/DRESSINGS) IMPLANT
BINDER BREAST XXLRG (GAUZE/BANDAGES/DRESSINGS) ×3 IMPLANT
BLADE SURG 15 STRL LF DISP TIS (BLADE) ×1 IMPLANT
BLADE SURG 15 STRL SS (BLADE) ×2
CANISTER SUC SOCK COL 7IN (MISCELLANEOUS) IMPLANT
CANISTER SUCT 1200ML W/VALVE (MISCELLANEOUS) IMPLANT
CHLORAPREP W/TINT 26ML (MISCELLANEOUS) ×3 IMPLANT
CLIP APPLIE 9.375 MED OPEN (MISCELLANEOUS) ×1 IMPLANT
CLOSURE WOUND 1/2 X4 (GAUZE/BANDAGES/DRESSINGS) ×1
COVER BACK TABLE 60X90IN (DRAPES) ×3 IMPLANT
COVER MAYO STAND STRL (DRAPES) ×3 IMPLANT
COVER PROBE W GEL 5X96 (DRAPES) ×3 IMPLANT
DECANTER SPIKE VIAL GLASS SM (MISCELLANEOUS) IMPLANT
DEVICE DUBIN W/COMP PLATE 8390 (MISCELLANEOUS) ×3 IMPLANT
DRAPE LAPAROSCOPIC ABDOMINAL (DRAPES) ×3 IMPLANT
DRAPE UTILITY XL STRL (DRAPES) ×3 IMPLANT
ELECT COATED BLADE 2.86 ST (ELECTRODE) ×3 IMPLANT
ELECT REM PT RETURN 9FT ADLT (ELECTROSURGICAL) ×3
ELECTRODE REM PT RTRN 9FT ADLT (ELECTROSURGICAL) ×1 IMPLANT
GLOVE BIO SURGEON STRL SZ 6.5 (GLOVE) ×4 IMPLANT
GLOVE BIO SURGEON STRL SZ7 (GLOVE) ×6 IMPLANT
GLOVE BIO SURGEONS STRL SZ 6.5 (GLOVE) ×2
GLOVE BIOGEL PI IND STRL 7.0 (GLOVE) ×2 IMPLANT
GLOVE BIOGEL PI IND STRL 7.5 (GLOVE) ×1 IMPLANT
GLOVE BIOGEL PI INDICATOR 7.0 (GLOVE) ×4
GLOVE BIOGEL PI INDICATOR 7.5 (GLOVE) ×2
GOWN STRL REUS W/ TWL LRG LVL3 (GOWN DISPOSABLE) ×2 IMPLANT
GOWN STRL REUS W/TWL LRG LVL3 (GOWN DISPOSABLE) ×4
ILLUMINATOR WAVEGUIDE N/F (MISCELLANEOUS) IMPLANT
KIT MARKER MARGIN INK (KITS) ×3 IMPLANT
LIGHT WAVEGUIDE WIDE FLAT (MISCELLANEOUS) IMPLANT
LIQUID BAND (GAUZE/BANDAGES/DRESSINGS) ×3 IMPLANT
NDL SAFETY ECLIPSE 18X1.5 (NEEDLE) ×1 IMPLANT
NEEDLE HYPO 18GX1.5 SHARP (NEEDLE) ×2
NEEDLE HYPO 25X1 1.5 SAFETY (NEEDLE) ×6 IMPLANT
NS IRRIG 1000ML POUR BTL (IV SOLUTION) IMPLANT
PACK BASIN DAY SURGERY FS (CUSTOM PROCEDURE TRAY) ×3 IMPLANT
PENCIL BUTTON HOLSTER BLD 10FT (ELECTRODE) ×3 IMPLANT
SHEET MEDIUM DRAPE 40X70 STRL (DRAPES) IMPLANT
SLEEVE SCD COMPRESS KNEE MED (MISCELLANEOUS) ×3 IMPLANT
SPONGE LAP 4X18 X RAY DECT (DISPOSABLE) ×6 IMPLANT
STOCKINETTE IMPERVIOUS LG (DRAPES) IMPLANT
STRIP CLOSURE SKIN 1/2X4 (GAUZE/BANDAGES/DRESSINGS) ×2 IMPLANT
SUT ETHILON 2 0 FS 18 (SUTURE) IMPLANT
SUT MNCRL AB 4-0 PS2 18 (SUTURE) ×6 IMPLANT
SUT MON AB 5-0 PS2 18 (SUTURE) IMPLANT
SUT SILK 2 0 SH (SUTURE) IMPLANT
SUT VIC AB 2-0 SH 27 (SUTURE) ×10
SUT VIC AB 2-0 SH 27XBRD (SUTURE) ×5 IMPLANT
SUT VIC AB 3-0 SH 27 (SUTURE) ×4
SUT VIC AB 3-0 SH 27X BRD (SUTURE) ×2 IMPLANT
SUT VIC AB 5-0 PS2 18 (SUTURE) IMPLANT
SYR CONTROL 10ML LL (SYRINGE) ×6 IMPLANT
TOWEL OR 17X24 6PK STRL BLUE (TOWEL DISPOSABLE) ×3 IMPLANT
TOWEL OR NON WOVEN STRL DISP B (DISPOSABLE) ×3 IMPLANT
TUBE CONNECTING 20'X1/4 (TUBING)
TUBE CONNECTING 20X1/4 (TUBING) IMPLANT
YANKAUER SUCT BULB TIP NO VENT (SUCTIONS) IMPLANT

## 2015-06-15 NOTE — Discharge Instructions (Signed)
[facility] Office Phone Number [phone]  POST OP INSTRUCTIONS  Always review your discharge instruction sheet given to you by the facility where your surgery was performed.  IF YOU HAVE DISABILITY OR FAMILY LEAVE FORMS, YOU MUST BRING THEM TO THE OFFICE FOR PROCESSING.  DO NOT GIVE THEM TO YOUR DOCTOR.  1. A prescription for pain medication may be given to you upon discharge.  Take your pain medication as prescribed, if needed.  If narcotic pain medicine is not needed, then you may take acetaminophen (Tylenol), naprosyn (Alleve) or ibuprofen (Advil) as needed. 2. Take your usually prescribed medications unless otherwise directed 3. If you need a refill on your pain medication, please contact your pharmacy.  They will contact our office to request authorization.  Prescriptions will not be filled after 5pm or on week-ends. 4. You should eat very light the first 24 hours after surgery, such as soup, crackers, pudding, etc.  Resume your normal diet the day after surgery. 5. Most patients will experience some swelling and bruising in the breast.  Ice packs and a good support bra will help.  Wear the breast binder provided or a sports bra for 72 hours day and night.  After that wear a sports bra during the day until you return to the office. Swelling and bruising can take several days to resolve.  6. It is common to experience some constipation if taking pain medication after surgery.  Increasing fluid intake and taking a stool softener will usually help or prevent this problem from occurring.  A mild laxative (Milk of Magnesia or Miralax) should be taken according to package directions if there are no bowel movements after 48 hours. 7. Unless discharge instructions indicate otherwise, you may remove your bandages 48 hours after surgery and you may shower at that time.  You may have steri-strips (small skin tapes) in place directly over the incision.  These strips should be left on the  skin for 7-10 days and will come off on their own.  If your surgeon used skin glue on the incision, you may shower in 24 hours.  The glue will flake off over the next 2-3 weeks.  Any sutures or staples will be removed at the office during your follow-up visit. 8. ACTIVITIES:  You may resume regular daily activities (gradually increasing) beginning the next day.  Wearing a good support bra or sports bra minimizes pain and swelling.  You may have sexual intercourse when it is comfortable. a. You may drive when you no longer are taking prescription pain medication, you can comfortably wear a seatbelt, and you can safely maneuver your car and apply brakes. b. RETURN TO WORK:  ______________________________________________________________________________________ 9. You should see your doctor in the office for a follow-up appointment approximately two weeks after your surgery.  Your doctors nurse will typically make your follow-up appointment when she calls you with your pathology report.  Expect your pathology report 3-4 business days after your surgery.  You may call to check if you do not hear from [place] after three days. 10. OTHER INSTRUCTIONS: _______________________________________________________________________________________________ _____________________________________________________________________________________________________________________________________ _____________________________________________________________________________________________________________________________________ _____________________________________________________________________________________________________________________________________  WHEN TO CALL DR WAKEFIELD: 1. Fever over 101.0 2. Nausea and/or vomiting. 3. Extreme swelling or bruising. 4. Continued bleeding from incision. 5. Increased pain, redness, or drainage from the incision.  The clinic staff is available to answer your questions during regular  business hours.  Please dont hesitate to call and ask to speak to one of the nurses for clinical concerns.  If  you have a medical emergency, go to the nearest emergency room or call 911.  A surgeon from [facility] is always on call at the hospital.  For further questions, please visit [email or web address]    Post Anesthesia Home Care Instructions  Activity: Get plenty of rest for the remainder of the day. A responsible adult should stay with you for 24 hours following the procedure.  For the next 24 hours, DO NOT: -Drive a car -Paediatric nurse -Drink alcoholic beverages -Take any medication unless instructed by your physician -Make any legal decisions or sign important papers.  Meals: Start with liquid foods such as gelatin or soup. Progress to regular foods as tolerated. Avoid greasy, spicy, heavy foods. If nausea and/or vomiting occur, drink only clear liquids until the nausea and/or vomiting subsides. Call your physician if vomiting continues.  Special Instructions/Symptoms: Your throat may feel dry or sore from the anesthesia or the breathing tube placed in your throat during surgery. If this causes discomfort, gargle with warm salt water. The discomfort should disappear within 24 hours.  If you had a scopolamine patch placed behind your ear for the management of post- operative nausea and/or vomiting:  1. The medication in the patch is effective for 72 hours, after which it should be removed.  Wrap patch in a tissue and discard in the trash. Wash hands thoroughly with soap and water. 2. You may remove the patch earlier than 72 hours if you experience unpleasant side effects which may include dry mouth, dizziness or visual disturbances. 3. Avoid touching the patch. Wash your hands with soap and water after contact with the patch.

## 2015-06-15 NOTE — Transfer of Care (Signed)
Immediate Anesthesia Transfer of Care Note  Patient: Susan Wilson  Procedure(s) Performed: Procedure(s): RADIOACTIVE SEED GUIDED PARTIAL MASTECTOMY WITH AXILLARY SENTINEL LYMPH NODE BIOPSY (Right)  Patient Location: PACU  Anesthesia Type:GA combined with regional for post-op pain  Level of Consciousness: sedated, patient cooperative and lethargic  Airway & Oxygen Therapy: Patient Spontanous Breathing and Patient connected to face mask oxygen  Post-op Assessment: Report given to RN and Post -op Vital signs reviewed and stable  Post vital signs: Reviewed and stable  Last Vitals:  Filed Vitals:   06/15/15 1202 06/15/15 1206  BP:    Pulse: 101 101  Temp:    Resp: 25 21    Complications: No apparent anesthesia complications

## 2015-06-15 NOTE — Anesthesia Preprocedure Evaluation (Addendum)
Anesthesia Evaluation  Patient identified by MRN, date of birth, ID band Patient awake    Reviewed: Allergy & Precautions, NPO status , Patient's Chart, lab work & pertinent test results  Airway Mallampati: II  TM Distance: >3 FB Neck ROM: Full    Dental   Pulmonary neg pulmonary ROS,    breath sounds clear to auscultation       Cardiovascular negative cardio ROS   Rhythm:Regular Rate:Normal     Neuro/Psych  Neuromuscular disease    GI/Hepatic negative GI ROS, Neg liver ROS,   Endo/Other  Morbid obesity  Renal/GU negative Renal ROS     Musculoskeletal   Abdominal   Peds  Hematology  (+) anemia ,   Anesthesia Other Findings   Reproductive/Obstetrics                            Anesthesia Physical Anesthesia Plan  ASA: III  Anesthesia Plan: General and Regional   Post-op Pain Management: GA combined w/ Regional for post-op pain   Induction: Intravenous  Airway Management Planned: LMA  Additional Equipment:   Intra-op Plan:   Post-operative Plan: Extubation in OR  Informed Consent: I have reviewed the patients History and Physical, chart, labs and discussed the procedure including the risks, benefits and alternatives for the proposed anesthesia with the patient or authorized representative who has indicated his/her understanding and acceptance.   Dental advisory given  Plan Discussed with: CRNA  Anesthesia Plan Comments: (Unable to obtain IV access prior to surgery. Port access team called pre-op for access. Recommend future anesthetics port-access be obtained prior to attempting IV's.)       Anesthesia Quick Evaluation

## 2015-06-15 NOTE — H&P (Signed)
  54 yof who presents after finding a left breast mass in May. this had begun to cause some discomfort when wearing a bra. She had no discharge. No familly history. No prior breast history. she underwent mm that showed a right liq im fold that measured 2.1x4.1x3.5cm. there are 3 nodes that have cortical thickening. there is additional 5 mm right breast nodule that has not been biopsied. this underwent biopsy showing a grade II-III IDC that is er/pr negative, her2 equivocal and Ki is 20%. MRI had a 6.2x5x4.2 cm mass that involved skin and pec. she has undergone chemotherapy which she completed last week and tolerated pretty well. her most recent mri shows a 4.8x1.8x.1.9 cm mass. no longer evidence of pec involvement but skin appears close still. there is no pathologic adenopathy. there is stable biopsy proven fa in loq of the left breast. I saw her and we discussed lumpectomy and she was interested in reduction. no changes since then. I have discussed with Dr Iran Planas and plan for reduction after path comes back.  Other Problems Rolm Bookbinder, MD; 05/22/2015 3:39 PM) Migraine Headache Umbilical Hernia Repair Lump In Breast Back Pain Breast Cancer  Past Surgical History Rolm Bookbinder, MD; 05/22/2015 3:39 PM) Breast Biopsy Right. Sentinel Lymph Node Biopsy  Diagnostic Studies History Rolm Bookbinder, MD; 05/22/2015 3:39 PM) Colonoscopy never Mammogram within last year Pap Smear 1-5 years ago  Allergies Marjean Donna, [place]; 05/22/2015 9:04 AM) No Known Drug Allergies01/10/2015  Medication History (Sonya Bynum, CMA; 05/22/2015 9:05 AM) Advil (100MG Tablet Chewable, Oral) Active. Ativan (0.5MG Tablet, Oral) Active. Compazine (10MG Capsule ER, Oral) Active. TobraDex (0.3-0.1% Ointment, Ophthalmic) Active. Medications Reconciled  Social History Rolm Bookbinder, MD; 05/22/2015 3:39 PM) Alcohol use Occasional alcohol use. Caffeine use Carbonated beverages,  Coffee. No drug use Tobacco use Never smoker.  ROS negative  Vitals (Sonya Bynum CMA; 05/22/2015 9:04 AM) 05/22/2015 9:04 AM Weight: 224 lb Height: 64in Body Surface Area: 2.05 m Body Mass Index: 38.45 kg/m  Temp.: 42F(Temporal)  Pulse: 76 (Regular)  BP: 134/76 (Sitting, Left Arm, Standard)       Physical Exam Rolm Bookbinder MD; 05/22/2015 3:41 PM) General Mental Status-Alert. Orientation-Oriented X3.  Chest and Lung Exam Chest and lung exam reveals -on auscultation, normal breath sounds, no adventitious sounds and normal vocal resonance.  Breast Nipples-No Discharge. Breast Lump-No Palpable Breast Mass.  Cardiovascular Cardiovascular examination reveals -normal heart sounds, regular rate and rhythm with no murmurs.  Lymphatic Head & Neck  General Head & Neck Lymphatics: Bilateral - Description - Normal. Axillary  General Axillary Region: Bilateral - Description - Normal.     CARCINOMA OF LOWER-INNER QUADRANT OF BREAST, RIGHT (C50.311) right breast lumpectomy with right axillary sn biopsy Dr Iran Planas will mark day of surgery with plan being lumpectomy and sn biopsy by me next Monday. once path is back and clear for reduction will proceed to reduction the following week. discussed again radiation and possible positive margin and rationale behind delaying reduction.

## 2015-06-15 NOTE — Interval H&P Note (Signed)
History and Physical Interval Note:  06/15/2015 11:30 AM  Susan Wilson  has presented today for surgery, with the diagnosis of RIGHT BREAST CANCER  The various methods of treatment have been discussed with the patient and family. After consideration of risks, benefits and other options for treatment, the patient has consented to  Procedure(s): RADIOACTIVE SEED GUIDED PARTIAL MASTECTOMY WITH AXILLARY SENTINEL LYMPH NODE BIOPSY (Right) as a surgical intervention .  The patient's history has been reviewed, patient examined, no change in status, stable for surgery.  I have reviewed the patient's chart and labs.  Questions were answered to the patient's satisfaction.     Derika Eckles

## 2015-06-15 NOTE — Op Note (Signed)
Preoperative diagnosis: Right breast cancer s/p primary chemo, stage II clinical Postoperative diagnosis: same as above Procedure: 1.Right breast seed guided lumpectomy 2. Right axillary sn biopsy 3. Injection blue dye for sentinel node identification Surgeon Dr Serita Grammes Anes general with pectoral block EBL: 150 cc Comps none Specimen:  1. Right  breast marked with paint 2. Sentinel nodes with highest count of 1392 3. Additional anterior with skin and posterior margins marked short superior, long lateral, double deep Sponge count correct at completion dispo to recovery stable  Indications: this is a 35 yof who has undergone primary chemotherapy with moderate response.  This was adherent to skin and pec by mri previously and this is better.  She is also planning on reduction next week after final path is back. She was marked by Dr Iran Planas prior to beginning. We discussed proceeding with lumpectomy/sn biopsy. She had radioactive seed placed prior to beginning. I had these mm in the OR.   Procedure: After informed consent was obtained the patient was taken to the OR. She was injected with technetium in the standard periareolar fashion. She underwent a pectoral block. She was given ancef. SCDs were in place. She was prepped and draped in the standard sterile surgical fashion. A timeout was performed. Her port had to be accessed for all of this due to poor iv access.   I injected a mixture of saline and methylene blue dye periareolar and massaged this. I then made an incision in the right lower inner quadrant.  This appeared to involve the skin to the IM fold so I removed a fairly large ellipse of skin.  I then removed the seed with an attempt to get a clear margin.  I did confirm removal of the clip and seed with mammography.I removed some additional anterior and posterior tissue to clear this margin as it appeared close.the posterior margin is the muscle. I placed clips in the cavity.   I closed the deep tissue with 2-0 vicryl. The superficial tissue was closedwith 3-0 vicryl and the skin was then closed with 4-0 monocryl Glue and steristrips were applied. I made an axillary incision.  I went through the axillary fascia. I was able to locate what appeared to be a sentinel node with highest count listed above. There were no more palpable or radioactive nodes present.I did cause some bleeding during this that retracted. I was able to control this with clips but was difficult due to her habitus.  I obtained hemostasis. I then closed the fascia with 2-0 vicryl The skin was closed with 3-0 vicryl and 4-0 monocryl. Glue and steristrips were placed A breast binder was placed. She was extubated and transferred to recovery stable

## 2015-06-15 NOTE — Progress Notes (Signed)
Assisted Dr. Rodman Comp with right, ultrasound guided, pectoralis block and nuc med inj with rad tech # [phone]. Side rails up, monitors on throughout procedure. See vital signs in flow sheet. Tolerated Procedure well.

## 2015-06-15 NOTE — Anesthesia Procedure Notes (Addendum)
Anesthesia Regional Block:  Pectoralis block  Pre-Anesthetic Checklist: ,, timeout performed, Correct Patient, Correct Site, Correct Laterality, Correct Procedure, Correct Position, site marked, Risks and benefits discussed,  Surgical consent,  Pre-op evaluation,  At surgeon's request and post-op pain management  Laterality: Right  Prep: chloraprep       Needles:  Injection technique: Single-shot  Needle Type: Echogenic Needle     Needle Length: 9cm 9 cm Needle Gauge: 21 and 21 G    Additional Needles:  Procedures: ultrasound guided (picture in chart) Pectoralis block Narrative:  Start time: 06/15/2015 11:52 AM End time: 06/15/2015 12:00 PM Injection made incrementally with aspirations every 5 mL.  Performed by: Personally  Anesthesiologist: Suzette Battiest  Additional Notes: Risks and benefits discussed. Pt tolerated well with no immediate complications.   Procedure Name: LMA Insertion Date/Time: 06/15/2015 12:24 PM Performed by: Lyndee Leo Pre-anesthesia Checklist: Patient identified, Emergency Drugs available, Suction available and Patient being monitored Patient Re-evaluated:Patient Re-evaluated prior to inductionOxygen Delivery Method: Circle System Utilized Preoxygenation: Pre-oxygenation with 100% oxygen Intubation Type: IV induction Ventilation: Mask ventilation without difficulty LMA: LMA inserted LMA Size: 4.0 Number of attempts: 1 Airway Equipment and Method: Bite block Placement Confirmation: positive ETCO2 Tube secured with: Tape Dental Injury: Teeth and Oropharynx as per pre-operative assessment

## 2015-06-16 ENCOUNTER — Encounter (HOSPITAL_BASED_OUTPATIENT_CLINIC_OR_DEPARTMENT_OTHER): Payer: Self-pay | Admitting: General Surgery

## 2015-06-16 NOTE — Anesthesia Postprocedure Evaluation (Signed)
Anesthesia Post Note  Patient: Susan Wilson  Procedure(s) Performed: Procedure(s) (LRB): RADIOACTIVE SEED GUIDED PARTIAL MASTECTOMY WITH AXILLARY SENTINEL LYMPH NODE BIOPSY (Right)  Patient location during evaluation: PACU Anesthesia Type: General and Regional Level of consciousness: awake and alert Pain management: pain level controlled Vital Signs Assessment: post-procedure vital signs reviewed and stable Respiratory status: spontaneous breathing Cardiovascular status: blood pressure returned to baseline Anesthetic complications: no    Last Vitals:  Filed Vitals:   06/15/15 1445 06/15/15 1610  BP: 126/93 135/96  Pulse: 98 97  Temp:  36.9 C  Resp: 14 16    Last Pain:  Filed Vitals:   06/15/15 1611  PainSc: 4                  Tiajuana Amass

## 2015-06-17 ENCOUNTER — Encounter (HOSPITAL_BASED_OUTPATIENT_CLINIC_OR_DEPARTMENT_OTHER): Payer: Self-pay | Admitting: *Deleted

## 2015-06-18 ENCOUNTER — Other Ambulatory Visit: Payer: Self-pay | Admitting: Oncology

## 2015-06-18 NOTE — Anesthesia Preprocedure Evaluation (Addendum)
Anesthesia Evaluation  Patient identified by MRN, date of birth, ID band Patient awake    Reviewed: Allergy & Precautions, NPO status , Patient's Chart, lab work & pertinent test results  Airway Mallampati: II  TM Distance: >3 FB Neck ROM: Full    Dental no notable dental hx.    Pulmonary neg pulmonary ROS,    Pulmonary exam normal breath sounds clear to auscultation       Cardiovascular Exercise Tolerance: Good negative cardio ROS Normal cardiovascular exam Rhythm:Regular Rate:Normal     Neuro/Psych  Headaches,  Neuromuscular disease negative psych ROS   GI/Hepatic negative GI ROS, Neg liver ROS,   Endo/Other  negative endocrine ROS  Renal/GU negative Renal ROS  negative genitourinary   Musculoskeletal negative musculoskeletal ROS (+)   Abdominal (+) + obese,   Peds negative pediatric ROS (+)  Hematology  (+) anemia ,   Anesthesia Other Findings   Reproductive/Obstetrics negative OB ROS                             Anesthesia Physical Anesthesia Plan  ASA: II  Anesthesia Plan: General   Post-op Pain Management:    Induction: Intravenous  Airway Management Planned: Oral ETT  Additional Equipment:   Intra-op Plan:   Post-operative Plan: Extubation in OR  Informed Consent: I have reviewed the patients History and Physical, chart, labs and discussed the procedure including the risks, benefits and alternatives for the proposed anesthesia with the patient or authorized representative who has indicated his/her understanding and acceptance.   Dental advisory given  Plan Discussed with: CRNA  Anesthesia Plan Comments: (Very difficult IV access. Multiple attempts including ultrasound guided saphenous and AC without success. Consider accessing port for case.)       Anesthesia Quick Evaluation

## 2015-06-19 NOTE — H&P (Signed)
  Subjective:    Patient ID: Susan Wilson is a 40 y.o. female.  HPI  Patient of Drs. Magrinat and Donne Hazel with plan for staged oncoplastic breast reduction. Presented with palpavle mass in her right breast MMG and US revealed breast density was category B,  hyperdense mass in the right LIQ associated with skin thickening. There was also a 5 mm nodule posteriorly at the 8:30 o'clock position in the right breast. There were several hyperdense nodules in the right axilla. On physical exam there was a firm fixed mass in the right breast at the 5:00 position. By ultrasound this was lobulated and appear to involve the skin, measuring up to 4.1 cm. The right axilla showed 3 hypervascular lymph nodes with prominent cortical thickening. Right breast mass and one axillary node with IDC ER/PR - HER-2 equivocal. LN negative though felt to be discordant, repeat biopsy negative. Underwent neoadjuvant chemotherapy, completed end 04/2015.  Final MRI with residual 4.8 x 1.8 x 1.9 cm mass, resolution of axillary nodes. She is now postoperative right lumpectomy with clear margins.  Genetics negative  Current 44 DD Wt down 8 lbs since star chemotherapy.  Review of Systems  Constitutional: Positive for fatigue.  Musculoskeletal: Positive for back pain.  Allergic/Immunologic: Positive for environmental allergies.  Neurological: Positive for numbness.   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    Objective:   Physical Exam  Constitutional: She is oriented to person, place, and time.  Cardiovascular: Normal rate, regular rhythm and normal heart sounds.  Pulmonary/Chest: Effort normal and breath sounds normal.  Abdominal: Soft.   Skin:  Fitzpatrick 6   GU: no palpable masses, left> right volume , grade 2 ptosis bilat SN to nipple R 33 L 34 cm BW R 21 L 22 cm Nipple to IMF R 18 L 20 cm    Assessment:     Right breast cancer, neoadjuvant chemotherapy     Plan:     Plan oncoplastic reconstruction as staged procedure with reduction 7-10 d post lumpectomy to ensure pathologic clearance. Reviewed reduction with anchor type scars, drains, post operative visits and limitations, recovery. Diminished sensation nipple and breast skin, risk of nipple loss, wound healing problems, asymmetry. She will require XRT and smaller breast size may aid with this. Discussed will have some contraction of breast volume and increased firmness with radiation, less ptosis with aging. Discussed changes with wt gain, loss, aging. Reviewed with patien breast reduction following XRT with increased risk complications. Discussed risks hyper or hypopigmentation scar.   Irene Limbo, MD [facility] Plastic & Reconstructive Surgery [phone]

## 2015-06-23 ENCOUNTER — Ambulatory Visit (HOSPITAL_BASED_OUTPATIENT_CLINIC_OR_DEPARTMENT_OTHER): Payer: 59 | Admitting: Anesthesiology

## 2015-06-23 ENCOUNTER — Encounter (HOSPITAL_BASED_OUTPATIENT_CLINIC_OR_DEPARTMENT_OTHER)
Admission: RE | Disposition: A | Discharge status: Home / Self Care | Payer: Self-pay | Source: Ambulatory Visit | Attending: Plastic Surgery

## 2015-06-23 ENCOUNTER — Ambulatory Visit (HOSPITAL_BASED_OUTPATIENT_CLINIC_OR_DEPARTMENT_OTHER)
Admission: RE | Admit: 2015-06-23 | Discharge: 2015-06-24 | Disposition: A | Discharge status: Home / Self Care | Payer: 59 | Source: Ambulatory Visit | Attending: Plastic Surgery | Admitting: Plastic Surgery

## 2015-06-23 ENCOUNTER — Encounter (HOSPITAL_BASED_OUTPATIENT_CLINIC_OR_DEPARTMENT_OTHER): Payer: Self-pay

## 2015-06-23 DIAGNOSIS — Z853 Personal history of malignant neoplasm of breast: Secondary | ICD-10-CM | POA: Diagnosis not present

## 2015-06-23 DIAGNOSIS — N6011 Diffuse cystic mastopathy of right breast: Secondary | ICD-10-CM | POA: Insufficient documentation

## 2015-06-23 DIAGNOSIS — N6022 Fibroadenosis of left breast: Secondary | ICD-10-CM | POA: Insufficient documentation

## 2015-06-23 DIAGNOSIS — C50911 Malignant neoplasm of unspecified site of right female breast: Secondary | ICD-10-CM | POA: Diagnosis present

## 2015-06-23 DIAGNOSIS — Z421 Encounter for breast reconstruction following mastectomy: Secondary | ICD-10-CM | POA: Insufficient documentation

## 2015-06-23 DIAGNOSIS — N6021 Fibroadenosis of right breast: Secondary | ICD-10-CM | POA: Insufficient documentation

## 2015-06-23 DIAGNOSIS — N6012 Diffuse cystic mastopathy of left breast: Secondary | ICD-10-CM | POA: Insufficient documentation

## 2015-06-23 HISTORY — PX: BREAST REDUCTION SURGERY: SHX8

## 2015-06-23 HISTORY — PX: BREAST RECONSTRUCTION: SHX9

## 2015-06-23 SURGERY — RECONSTRUCTION, BREAST
Anesthesia: General | Site: Breast | Laterality: Right

## 2015-06-23 MED ORDER — SUCCINYLCHOLINE CHLORIDE 20 MG/ML IJ SOLN
INTRAMUSCULAR | Status: AC
  Filled 2015-06-23: qty 1

## 2015-06-23 MED ORDER — FENTANYL CITRATE (PF) 100 MCG/2ML IJ SOLN
INTRAMUSCULAR | Status: AC
  Filled 2015-06-23: qty 2

## 2015-06-23 MED ORDER — CEFAZOLIN SODIUM-DEXTROSE 2-3 GM-% IV SOLR
2.0000 g | INTRAVENOUS | Status: AC
  Administered 2015-06-23: 2 g via INTRAVENOUS

## 2015-06-23 MED ORDER — ONDANSETRON 4 MG PO TBDP: 4.0000 mg | ORAL_TABLET | Freq: Four times a day (QID) | ORAL | Status: DC | PRN

## 2015-06-23 MED ORDER — SUCCINYLCHOLINE CHLORIDE 20 MG/ML IJ SOLN
INTRAMUSCULAR | Status: DC | PRN
  Administered 2015-06-23: 100 mg via INTRAVENOUS

## 2015-06-23 MED ORDER — HYDROMORPHONE HCL 1 MG/ML IJ SOLN
INTRAMUSCULAR | Status: AC
  Filled 2015-06-23: qty 1

## 2015-06-23 MED ORDER — CEFAZOLIN SODIUM-DEXTROSE 2-3 GM-% IV SOLR
2.0000 g | Freq: Three times a day (TID) | INTRAVENOUS | Status: AC
  Administered 2015-06-23: 2 g via INTRAVENOUS
  Filled 2015-06-23: qty 50

## 2015-06-23 MED ORDER — DEXAMETHASONE SODIUM PHOSPHATE 4 MG/ML IJ SOLN
INTRAMUSCULAR | Status: DC | PRN
  Administered 2015-06-23: 10 mg via INTRAVENOUS

## 2015-06-23 MED ORDER — LIDOCAINE HCL (CARDIAC) 20 MG/ML IV SOLN
INTRAVENOUS | Status: AC
  Filled 2015-06-23: qty 5

## 2015-06-23 MED ORDER — OXYCODONE HCL 5 MG PO TABS
ORAL_TABLET | ORAL | Status: AC
  Filled 2015-06-23: qty 2

## 2015-06-23 MED ORDER — FENTANYL CITRATE (PF) 100 MCG/2ML IJ SOLN
50.0000 ug | INTRAMUSCULAR | Status: AC | PRN
  Administered 2015-06-23 (×2): 25 ug via INTRAVENOUS
  Administered 2015-06-23: 50 ug via INTRAVENOUS
  Administered 2015-06-23: 25 ug via INTRAVENOUS
  Administered 2015-06-23: 100 ug via INTRAVENOUS
  Administered 2015-06-23: 25 ug via INTRAVENOUS
  Administered 2015-06-23: 100 ug via INTRAVENOUS
  Administered 2015-06-23: 25 ug via INTRAVENOUS
  Administered 2015-06-23 (×2): 50 ug via INTRAVENOUS
  Administered 2015-06-23: 25 ug via INTRAVENOUS
  Administered 2015-06-23: 50 ug via INTRAVENOUS
  Administered 2015-06-23 (×2): 25 ug via INTRAVENOUS

## 2015-06-23 MED ORDER — ACETAMINOPHEN 325 MG PO TABS
ORAL_TABLET | ORAL | Status: AC
  Filled 2015-06-23: qty 1

## 2015-06-23 MED ORDER — OXYCODONE-ACETAMINOPHEN 10-325 MG PO TABS
1.0000 | ORAL_TABLET | ORAL | Status: DC | PRN
  Administered 2015-06-23 (×2): 1 via ORAL
  Administered 2015-06-23: 2 via ORAL
  Administered 2015-06-24: 1 via ORAL

## 2015-06-23 MED ORDER — ONDANSETRON HCL 4 MG/2ML IJ SOLN
INTRAMUSCULAR | Status: DC | PRN
Start: 2015-06-23 — End: 2015-06-23
  Administered 2015-06-23: 4 mg via INTRAVENOUS

## 2015-06-23 MED ORDER — MIDAZOLAM HCL 2 MG/2ML IJ SOLN
INTRAMUSCULAR | Status: AC
  Filled 2015-06-23: qty 2

## 2015-06-23 MED ORDER — DEXAMETHASONE SODIUM PHOSPHATE 10 MG/ML IJ SOLN
INTRAMUSCULAR | Status: AC
  Filled 2015-06-23: qty 1

## 2015-06-23 MED ORDER — KETOROLAC TROMETHAMINE 30 MG/ML IJ SOLN
INTRAMUSCULAR | Status: AC
  Filled 2015-06-23: qty 1

## 2015-06-23 MED ORDER — DIPHENHYDRAMINE HCL 50 MG/ML IJ SOLN: 25.0000 mg | Freq: Four times a day (QID) | INTRAMUSCULAR | Status: DC | PRN

## 2015-06-23 MED ORDER — SCOPOLAMINE 1 MG/3DAYS TD PT72: 1.0000 | MEDICATED_PATCH | Freq: Once | TRANSDERMAL | Status: DC | PRN

## 2015-06-23 MED ORDER — ONDANSETRON HCL 4 MG/2ML IJ SOLN
INTRAMUSCULAR | Status: AC
  Filled 2015-06-23: qty 2

## 2015-06-23 MED ORDER — PROPOFOL 10 MG/ML IV BOLUS
INTRAVENOUS | Status: DC | PRN
  Administered 2015-06-23: 150 mg via INTRAVENOUS

## 2015-06-23 MED ORDER — FENTANYL CITRATE (PF) 100 MCG/2ML IJ SOLN
INTRAMUSCULAR | Status: AC
Start: 2015-06-23 — End: 2015-06-23
  Filled 2015-06-23: qty 2

## 2015-06-23 MED ORDER — ARTIFICIAL TEARS OP OINT
TOPICAL_OINTMENT | OPHTHALMIC | Status: AC
  Filled 2015-06-23: qty 3.5

## 2015-06-23 MED ORDER — CHLORHEXIDINE GLUCONATE 4 % EX LIQD: 1.0000 "application " | Freq: Once | CUTANEOUS | Status: DC

## 2015-06-23 MED ORDER — DIPHENHYDRAMINE HCL 25 MG PO CAPS
25.0000 mg | ORAL_CAPSULE | Freq: Four times a day (QID) | ORAL | Status: DC | PRN
  Administered 2015-06-23: 25 mg via ORAL
  Filled 2015-06-23: qty 1

## 2015-06-23 MED ORDER — 0.9 % SODIUM CHLORIDE (POUR BTL) OPTIME
TOPICAL | Status: DC | PRN
  Administered 2015-06-23: 1000 mL

## 2015-06-23 MED ORDER — BUPIVACAINE HCL (PF) 0.25 % IJ SOLN
INTRAMUSCULAR | Status: AC
  Filled 2015-06-23: qty 30

## 2015-06-23 MED ORDER — ACETAMINOPHEN 10 MG/ML IV SOLN
INTRAVENOUS | Status: AC
  Filled 2015-06-23: qty 100

## 2015-06-23 MED ORDER — GLYCOPYRROLATE 0.2 MG/ML IJ SOLN: 0.2000 mg | Freq: Once | INTRAMUSCULAR | Status: DC | PRN

## 2015-06-23 MED ORDER — PROPOFOL 10 MG/ML IV BOLUS
INTRAVENOUS | Status: AC
  Filled 2015-06-23: qty 20

## 2015-06-23 MED ORDER — LORAZEPAM 0.5 MG PO TABS: 0.5000 mg | ORAL_TABLET | Freq: Every day | ORAL | Status: DC

## 2015-06-23 MED ORDER — KCL IN DEXTROSE-NACL 20-5-0.45 MEQ/L-%-% IV SOLN
INTRAVENOUS | Status: DC
  Administered 2015-06-23: 14:00:00 via INTRAVENOUS
  Filled 2015-06-23: qty 1000

## 2015-06-23 MED ORDER — ONDANSETRON HCL 4 MG/2ML IJ SOLN
4.0000 mg | Freq: Four times a day (QID) | INTRAMUSCULAR | Status: DC | PRN
Start: 2015-06-23 — End: 2015-06-24

## 2015-06-23 MED ORDER — HEPARIN SOD (PORK) LOCK FLUSH 100 UNIT/ML IV SOLN
500.0000 [IU] | INTRAVENOUS | Status: AC | PRN
  Administered 2015-06-23: 500 [IU]

## 2015-06-23 MED ORDER — OXYCODONE-ACETAMINOPHEN 10-325 MG PO TABS: 1.0000 | ORAL_TABLET | ORAL | Status: DC | PRN

## 2015-06-23 MED ORDER — MORPHINE SULFATE 10 MG/ML IJ SOLN
INTRAMUSCULAR | Status: DC | PRN
  Administered 2015-06-23 (×2): 1 mg via INTRAVENOUS

## 2015-06-23 MED ORDER — KETOROLAC TROMETHAMINE 30 MG/ML IJ SOLN
30.0000 mg | Freq: Three times a day (TID) | INTRAMUSCULAR | Status: DC
  Administered 2015-06-23 (×2): 30 mg via INTRAVENOUS

## 2015-06-23 MED ORDER — LACTATED RINGERS IV SOLN
INTRAVENOUS | Status: DC
  Administered 2015-06-23 (×5): via INTRAVENOUS

## 2015-06-23 MED ORDER — PROCHLORPERAZINE MALEATE 10 MG PO TABS: 10.0000 mg | ORAL_TABLET | Freq: Four times a day (QID) | ORAL | Status: DC | PRN

## 2015-06-23 MED ORDER — HYDROMORPHONE HCL 1 MG/ML IJ SOLN
0.5000 mg | Freq: Once | INTRAMUSCULAR | Status: AC
  Administered 2015-06-23: 0.5 mg via INTRAVENOUS

## 2015-06-23 MED ORDER — MIDAZOLAM HCL 2 MG/2ML IJ SOLN
1.0000 mg | INTRAMUSCULAR | Status: DC | PRN
  Administered 2015-06-23: 2 mg via INTRAVENOUS

## 2015-06-23 MED ORDER — HYDROMORPHONE HCL 1 MG/ML IJ SOLN: 0.5000 mg | INTRAMUSCULAR | Status: DC | PRN

## 2015-06-23 MED ORDER — PROMETHAZINE HCL 25 MG/ML IJ SOLN
6.2500 mg | INTRAMUSCULAR | Status: DC | PRN
  Administered 2015-06-23: 6.25 mg via INTRAVENOUS
  Filled 2015-06-23: qty 1

## 2015-06-23 MED ORDER — FENTANYL CITRATE (PF) 100 MCG/2ML IJ SOLN
25.0000 ug | INTRAMUSCULAR | Status: DC | PRN
  Administered 2015-06-23 (×3): 50 ug via INTRAVENOUS

## 2015-06-23 MED ORDER — PHENYLEPHRINE HCL 10 MG/ML IJ SOLN
INTRAMUSCULAR | Status: DC | PRN
  Administered 2015-06-23: 80 ug via INTRAVENOUS
  Administered 2015-06-23: 40 ug via INTRAVENOUS

## 2015-06-23 MED ORDER — MORPHINE SULFATE (PF) 2 MG/ML IV SOLN
INTRAVENOUS | Status: AC
  Filled 2015-06-23: qty 1

## 2015-06-23 MED ORDER — ACETAMINOPHEN 10 MG/ML IV SOLN
1000.0000 mg | Freq: Four times a day (QID) | INTRAVENOUS | Status: DC
  Administered 2015-06-23: 1000 mg via INTRAVENOUS

## 2015-06-23 SURGICAL SUPPLY — 66 items
APPLIER CLIP 9.375 MED OPEN (MISCELLANEOUS) ×4
BAG DECANTER FOR FLEXI CONT (MISCELLANEOUS) ×4 IMPLANT
BANDAGE ACE 6X5 VEL STRL LF (GAUZE/BANDAGES/DRESSINGS) IMPLANT
BINDER BREAST 3XL (BIND) ×4 IMPLANT
BINDER BREAST LRG (GAUZE/BANDAGES/DRESSINGS) IMPLANT
BINDER BREAST MEDIUM (GAUZE/BANDAGES/DRESSINGS) IMPLANT
BINDER BREAST XLRG (GAUZE/BANDAGES/DRESSINGS) IMPLANT
BINDER BREAST XXLRG (GAUZE/BANDAGES/DRESSINGS) IMPLANT
BLADE SURG 10 STRL SS (BLADE) ×16 IMPLANT
BLADE SURG 15 STRL LF DISP TIS (BLADE) ×2 IMPLANT
BLADE SURG 15 STRL SS (BLADE) ×2
BNDG GAUZE ELAST 4 BULKY (GAUZE/BANDAGES/DRESSINGS) ×8 IMPLANT
CANISTER SUCT 1200ML W/VALVE (MISCELLANEOUS) ×4 IMPLANT
CHLORAPREP W/TINT 26ML (MISCELLANEOUS) ×4 IMPLANT
CLIP APPLIE 9.375 MED OPEN (MISCELLANEOUS) ×2 IMPLANT
COVER BACK TABLE 60X90IN (DRAPES) ×4 IMPLANT
COVER MAYO STAND STRL (DRAPES) ×4 IMPLANT
DECANTER SPIKE VIAL GLASS SM (MISCELLANEOUS) ×8 IMPLANT
DRAIN CHANNEL 15F RND FF W/TCR (WOUND CARE) ×8 IMPLANT
DRAPE LAPAROSCOPIC ABDOMINAL (DRAPES) IMPLANT
DRSG PAD ABDOMINAL 8X10 ST (GAUZE/BANDAGES/DRESSINGS) ×8 IMPLANT
ELECT BLADE 4.0 EZ CLEAN MEGAD (MISCELLANEOUS) ×4
ELECT COATED BLADE 2.86 ST (ELECTRODE) ×4 IMPLANT
ELECT REM PT RETURN 9FT ADLT (ELECTROSURGICAL) ×4
ELECTRODE BLDE 4.0 EZ CLN MEGD (MISCELLANEOUS) ×2 IMPLANT
ELECTRODE REM PT RTRN 9FT ADLT (ELECTROSURGICAL) ×2 IMPLANT
EVACUATOR SILICONE 100CC (DRAIN) ×8 IMPLANT
GLOVE BIO SURGEON STRL SZ 6 (GLOVE) ×8 IMPLANT
GLOVE BIO SURGEON STRL SZ 6.5 (GLOVE) IMPLANT
GLOVE BIO SURGEONS STRL SZ 6.5 (GLOVE)
GLOVE BIOGEL PI IND STRL 7.0 (GLOVE) ×6 IMPLANT
GLOVE BIOGEL PI INDICATOR 7.0 (GLOVE) ×6
GLOVE ECLIPSE 6.5 STRL STRAW (GLOVE) ×16 IMPLANT
GOWN STRL REUS W/ TWL LRG LVL3 (GOWN DISPOSABLE) ×4 IMPLANT
GOWN STRL REUS W/TWL LRG LVL3 (GOWN DISPOSABLE) ×4
IV NS 500ML (IV SOLUTION)
IV NS 500ML BAXH (IV SOLUTION) IMPLANT
KIT FILL SYSTEM UNIVERSAL (SET/KITS/TRAYS/PACK) IMPLANT
LIQUID BAND (GAUZE/BANDAGES/DRESSINGS) ×16 IMPLANT
NEEDLE HYPO 25X1 1.5 SAFETY (NEEDLE) IMPLANT
NS IRRIG 1000ML POUR BTL (IV SOLUTION) ×4 IMPLANT
PACK BASIN DAY SURGERY FS (CUSTOM PROCEDURE TRAY) ×4 IMPLANT
PACK UNIVERSAL I (CUSTOM PROCEDURE TRAY) ×4 IMPLANT
PENCIL BUTTON HOLSTER BLD 10FT (ELECTRODE) ×4 IMPLANT
PIN SAFETY STERILE (MISCELLANEOUS) ×4 IMPLANT
SHEET MEDIUM DRAPE 40X70 STRL (DRAPES) ×8 IMPLANT
SLEEVE SCD COMPRESS KNEE MED (MISCELLANEOUS) ×4 IMPLANT
SPONGE LAP 18X18 X RAY DECT (DISPOSABLE) ×20 IMPLANT
STAPLER VISISTAT 35W (STAPLE) ×12 IMPLANT
SUT ETHILON 2 0 FS 18 (SUTURE) ×8 IMPLANT
SUT MNCRL AB 4-0 PS2 18 (SUTURE) ×16 IMPLANT
SUT PDS 3-0 CT2 (SUTURE)
SUT PDS AB 2-0 CT2 27 (SUTURE) IMPLANT
SUT PDS II 3-0 CT2 27 ABS (SUTURE) IMPLANT
SUT PROLENE 2 0 CT2 30 (SUTURE) IMPLANT
SUT VIC AB 3-0 PS1 18 (SUTURE) ×14
SUT VIC AB 3-0 PS1 18XBRD (SUTURE) ×14 IMPLANT
SUT VICRYL 4-0 PS2 18IN ABS (SUTURE) ×24 IMPLANT
SYR 50ML LL SCALE MARK (SYRINGE) IMPLANT
SYR BULB IRRIGATION 50ML (SYRINGE) ×4 IMPLANT
SYR CONTROL 10ML LL (SYRINGE) IMPLANT
TOWEL OR 17X24 6PK STRL BLUE (TOWEL DISPOSABLE) ×8 IMPLANT
TUBE CONNECTING 20'X1/4 (TUBING) ×1
TUBE CONNECTING 20X1/4 (TUBING) ×3 IMPLANT
UNDERPAD 30X30 (UNDERPADS AND DIAPERS) ×8 IMPLANT
YANKAUER SUCT BULB TIP NO VENT (SUCTIONS) ×8 IMPLANT

## 2015-06-23 NOTE — Anesthesia Postprocedure Evaluation (Signed)
Anesthesia Post Note  Patient: Ivyana S Miers-Wilson  Procedure(s) Performed: Procedure(s) (LRB): ONCOPLASTIC RIGHT BREAST REDUCTION (Right) MAMMARY REDUCTION  (BREAST) LEFT BREAST FOR SYMETRY (BILATERAL BREAST REDUCTION) (Left)  Patient location during evaluation: PACU Anesthesia Type: General Level of consciousness: sedated Pain management: pain level controlled Vital Signs Assessment: post-procedure vital signs reviewed and stable Respiratory status: spontaneous breathing and respiratory function stable Cardiovascular status: stable Anesthetic complications: no    Last Vitals:  Filed Vitals:   06/23/15 1331 06/23/15 1350  BP: 158/82 153/95  Pulse: 110 112  Temp:  36.7 C  Resp: 20 16    Last Pain:  Filed Vitals:   06/23/15 1354  PainSc: 5                  Lashawne Dura DANIEL

## 2015-06-23 NOTE — Interval H&P Note (Signed)
History and Physical Interval Note:  06/23/2015 7:01 AM  Susan Wilson  has presented today for surgery, with the diagnosis of right breast cancer  The various methods of treatment have been discussed with the patient and family. After consideration of risks, benefits and other options for treatment, the patient has consented to  Procedure(s): ONCOPLASTIC RIGHT BREAST RECONSTRUCTION (Right) MAMMARY REDUCTION  (BREAST) LEFT BREAST FOR SYMETRY (BILATERAL BREAST REDUCTION) (Left) as a surgical intervention .  The patient's history has been reviewed, patient examined, no change in status, stable for surgery.  I have reviewed the patient's chart and labs.  Questions were answered to the patient's satisfaction.     [name]

## 2015-06-23 NOTE — Transfer of Care (Signed)
Immediate Anesthesia Transfer of Care Note  Patient: Susan Wilson  Procedure(s) Performed: Procedure(s): ONCOPLASTIC RIGHT BREAST REDUCTION (Right) MAMMARY REDUCTION  (BREAST) LEFT BREAST FOR SYMETRY (BILATERAL BREAST REDUCTION) (Left)  Patient Location: PACU  Anesthesia Type:General  Level of Consciousness: sedated, confused and responds to stimulation  Airway & Oxygen Therapy: Patient Spontanous Breathing and Patient connected to face mask oxygen  Post-op Assessment: Report given to RN and Post -op Vital signs reviewed and stable  Post vital signs: Reviewed and stable  Last Vitals:  Filed Vitals:   06/23/15 0636  BP: 128/81  Pulse: 96  Temp: 37.2 C  Resp: 20    Complications: No apparent anesthesia complications

## 2015-06-23 NOTE — Op Note (Signed)
Operative Note   DATE OF OPERATION: 2.7.17  LOCATION: [place]- outpatient  SURGICAL DIVISION: Plastic Surgery  PREOPERATIVE DIAGNOSES:  1. Right breast cancer lower inner quadrant post lumpectomy  POSTOPERATIVE DIAGNOSES:  same  PROCEDURE:  Oncoplastic reconstruction with bilateral breast reduction  SURGEON: Irene Limbo MD MBA  ASSISTANT: none  ANESTHESIA:  General.   EBL: 478 ml  COMPLICATIONS: None immediate.   INDICATIONS FOR PROCEDURE:  The patient, Susan Wilson, is a 40 y.o. female born on [birth year], is here for breast reconstruction by breast reduction following right breast lumpectomy 1 week ago.    FINDINGS: Right breast reduction 811 g, left breast reduction 858 g. Weight of prior right lumpectomy not recorded. Right lumpectomy cavity excised partially as part of reduction and this was sent as separate specimen.   DESCRIPTION OF PROCEDURE:  The patient was marked standing in the preoperative area to mark sternal notch, chest midline, anterior axillary lines, inframammary folds. The location of new nipple areolar complex was marked at level of on iframammary fold on anterior surface breast by palpation. This was marked symmetric over bilateral breasts. With aid of Wise pattern marker, location of new nipple areolar complex and vertical limbs (8 cm) were marked. The patient was taken to the operating room. SCDs were placed and IV antibiotics were given. The patient's operative site was prepped and draped in a sterile fashion. A time out was performed and all information was confirmed to be correct.   Over left breast, superomedial pedicle marked and nipple areolar complex marked with 50 mm diameter marker. Pedicle deepithlialized and developedto 5-6 cm thickness and dissected toward chest wall until tension free rotation of pedicle achieved. Inferior pole breast tissue resected as well as in area of planne inset of nipple areola superiorly. Medial and  lateral flaps developed. Breast tailor tacked closed.  I then directed attention to right breast where superomedial pedicle designed given lumpectomy cavity over lower inner quadrant. The pedicle was deepithelialized and developed to similar thicknessl. Inferior pole breast tissue excised. The prior lumpectomy cavity was noted down to pectoralis muscle. The anterior surface of cavity was largely excised with reduction. The lumpectomy cavity was sent as separate specimen. Additional clips placed to mark new boundary lumpectomy cavity. Skin and soft tissue flaps developed until able to be redraped over pedicle without tension. Patient brought to upright sitting position and assessed for symmetry. Patent returned to supine position and breast cavities irrigated and hemostasis obtained. 15 Fr JP placed in each breast and secured with 2-0 nylon. Closure completed with 3-0 vicryl to approximate dermis along inframammary fold and vertical limb. NAC inset with 4-0 vicryl in dermis. Skin closure completed with 4-0 monocryl subcuticular throughout.Tissue adhesive applied.  Dry dressing and breast binder applied. The patient was allowed to wake from anesthesia, extubated and taken to the recovery room in satisfactory condition.  SPECIMENS: 1. Left breast reduction 2. Right breast reduction (IDC, triple negative, post neoadjuvant chemotherapy) 3. Right breast reduction, lumpectomy cavity  DRAINS: 15 Fr JP in right and left breast  Irene Limbo, MD [facility] Plastic & Reconstructive Surgery [phone]

## 2015-06-23 NOTE — Anesthesia Procedure Notes (Signed)
Procedure Name: Intubation Date/Time: 06/23/2015 8:00 AM Performed by: Lyndee Leo Pre-anesthesia Checklist: Patient identified, Emergency Drugs available, Suction available and Patient being monitored Patient Re-evaluated:Patient Re-evaluated prior to inductionOxygen Delivery Method: Circle System Utilized Preoxygenation: Pre-oxygenation with 100% oxygen Intubation Type: IV induction Ventilation: Mask ventilation without difficulty Laryngoscope Size: Mac and 3 Grade View: Grade III Tube type: Oral Number of attempts: 1 Airway Equipment and Method: Stylet and Oral airway Placement Confirmation: ETT inserted through vocal cords under direct vision,  positive ETCO2 and breath sounds checked- equal and bilateral Secured at: 22 cm Tube secured with: Tape Dental Injury: Teeth and Oropharynx as per pre-operative assessment

## 2015-06-24 ENCOUNTER — Encounter (HOSPITAL_BASED_OUTPATIENT_CLINIC_OR_DEPARTMENT_OTHER): Payer: Self-pay | Admitting: Plastic Surgery

## 2015-06-24 MED ORDER — ACETAMINOPHEN 325 MG PO TABS
ORAL_TABLET | ORAL | Status: AC
  Filled 2015-06-24: qty 1

## 2015-06-24 MED ORDER — OXYCODONE HCL 5 MG PO TABS
ORAL_TABLET | ORAL | Status: AC
  Filled 2015-06-24: qty 2

## 2015-06-24 NOTE — Discharge Instructions (Signed)
About my Jackson-Pratt Bulb Drain  What is a Jackson-Pratt bulb? A Jackson-Pratt is a soft, round device used to collect drainage. It is connected to a long, thin drainage catheter, which is held in place by one or two small stiches near your surgical incision site. When the bulb is squeezed, it forms a vacuum, forcing the drainage to empty into the bulb.  Emptying the Jackson-Pratt bulb- To empty the bulb: 1. Release the plug on the top of the bulb. 2. Pour the bulb's contents into a measuring container which your nurse will provide. 3. Record the time emptied and amount of drainage. Empty the drain(s) as often as your     doctor or nurse recommends.  Date                  Time                    Amount (Drain 1)                 Amount (Drain 2)  _____________________________________________________________________  _____________________________________________________________________  _____________________________________________________________________  _____________________________________________________________________  _____________________________________________________________________  _____________________________________________________________________  _____________________________________________________________________  _____________________________________________________________________  Squeezing the Jackson-Pratt Bulb- To squeeze the bulb: 1. Make sure the plug at the top of the bulb is open. 2. Squeeze the bulb tightly in your fist. You will hear air squeezing from the bulb. 3. Replace the plug while the bulb is squeezed. 4. Use a safety pin to attach the bulb to your clothing. This will keep the catheter from     pulling at the bulb insertion site.  When to call your doctor- Call your doctor if:  Drain site becomes red, swollen or hot.  You have a fever greater than 101 degrees F.  There is oozing at the drain site.  Drain falls out (apply a guaze  bandage over the drain hole and secure it with tape).  Drainage increases daily not related to activity patterns. (You will usually have more drainage when you are active than when you are resting.)  Drainage has a bad odor.

## 2015-06-25 ENCOUNTER — Telehealth: Payer: Self-pay | Admitting: *Deleted

## 2015-06-25 ENCOUNTER — Other Ambulatory Visit: Payer: Self-pay | Admitting: *Deleted

## 2015-06-25 MED ORDER — LORAZEPAM 0.5 MG PO TABS: 0.5000 mg | ORAL_TABLET | Freq: Every day | ORAL | Status: DC

## 2015-06-25 NOTE — Telephone Encounter (Signed)
Called pt to let her know Rx-Lorazepam has been filled and is at her [facility] pharmacy.  Pt will p/u today. Pt knows she is to see Dr. Jana Hakim on 2/23. Message to be fwd [facility].Boelter,NP.

## 2015-07-03 ENCOUNTER — Other Ambulatory Visit: Payer: Self-pay | Admitting: Nurse Practitioner

## 2015-07-03 ENCOUNTER — Other Ambulatory Visit: Payer: Self-pay | Admitting: *Deleted

## 2015-07-03 DIAGNOSIS — C50311 Malignant neoplasm of lower-inner quadrant of right female breast: Secondary | ICD-10-CM

## 2015-07-08 NOTE — Progress Notes (Signed)
Location of Breast Cancer:Breast Cancer Lower Inner Quadrant Right Breast Histology per Pathology Report: Diagnosis 06-15-15 1. 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 - INVASIVE CARCINOMA IS FOCALLY LESS THAN 0.1 CM TO THE INFERIOR MARGIN OF SPECIMEN # 1. - SEE ONCOLOGY TABLE BELOW. 2. Breast, excision, right anterior margin - BENIGN BREAST PARENCHYMA AND SKIN. - THERE IS NO EVIDENCE OF MALIGNANCY. - SEE COMMENT. 3. Breast, excision, right posterior margin - BENIGN FIBROADIPOSE TISSUE AND SKELETAL MUSCLE. - THERE IS NO EVIDENCE OF MALIGNANCY. - SEE COMMENT. 4. Lymph node, sentinel, biopsy, Right - THERE IS NO EVIDENCE OF CARCINOMA IN 1 OF 1 LYMPH NODE, (0/1). 5. Lymph node, sentinel, biopsy, Right - THERE IS NO EVIDENCE OF CARCINOMA IN 1 OF 1 LYMPH NODE, (0/1). 6. Lymph node, sentinel, biopsy, Right - THERE IS NO EVIDENCE OF CARCINOMA IN 1 OF 1 LYMPH NODE, (0/1). 7. Lymph node, sentinel, biopsy, Right - THERE IS NO EVIDENCE OF CARCINOMA IN 1 OF 1 LYMPH NODE, (0/1). Microscopic Comment 1. BREAST, INVASIVE TUMOR, WITH LYMPH NODES PRESENT Specimen, including laterality and lymph node sampling (sentinel, non-sentinel): Right breast and right axillary lymph Receptor Status: ER(0%), PR (0%), Her2-neu (Equuivocal by FISH), Ki-67(20%) Ms. Cadiente-Wilson.noted a mass in her right breast sometime around April. Initially she thought it might be related to menstruation, but as it did not change and eventually became tender, she brought it to her physician's attention.   Past/Anticipated interventions by surgeon, if any:06-23-15 Right Breast Lumpectomy Past/Anticipated interventions by medical oncology, if any:11-20-14 Mammogram and Right U/S, Biopsy 1) neoadjuvant chemotherapy started 01/01/2015 consisting of cyclophosphamide and doxorubicin in dose dense fashion 4, with onpro support, completed 02/13/2015, followed by paclitaxel/ carboplatin  weekly 12 started 02/26/2015            (2) genetics testing 12/15/2014 through the Breast/Ovarian gene panel offered by [facility] found no deleterious mutations in ATM, BARD1, BRCA1, BRCA2, BRIP1, CDH1, CHEK2, EPCAM, FANCC, MLH1, MSH2, MSH6, NBN, PALB2, PMS2, PTEN, RAD51C, RAD51D, TP53, and XRCC2 (3) definitive surgery  to follow chemotherapy (4) radiation to follow surgery Lymphedema issues, if any: No Pain issues, if any: Right arm 3/10 taking Oxycodone for pain control  Skin: Bilateral breast healing without signs of infections, dark hyperpigmentation to the nipple areas tender to the incision site SAFETY ISSUES:  Prior radiation? No  Pacemaker/ICD? No Possible current pregnancy?No      Is the patient on methotrexate? No   Current Complaints / other details: Here to fine out about the radiation tratment plan   Menarche age 74, G51,P2, Menopause No , BC 15 years, HRT No BP 111/68 mmHg  Pulse 83  Temp(Src) 98.6 F (37 C) (Oral)  Ht _0  (1.651 m)  Wt 229 lb 12.8 oz (104.237 kg)  BMI 38.24 kg/m2  SpO2 100% Georgena Spurling, RN 07/08/2015,11:46 AM

## 2015-07-09 ENCOUNTER — Encounter: Payer: Self-pay | Admitting: Radiation Oncology

## 2015-07-09 ENCOUNTER — Ambulatory Visit
Admission: RE | Admit: 2015-07-09 | Discharge: 2015-07-09 | Disposition: A | Discharge status: Home / Self Care | Payer: 59 | Source: Ambulatory Visit | Attending: Radiation Oncology | Admitting: Radiation Oncology

## 2015-07-09 ENCOUNTER — Encounter: Payer: Self-pay | Admitting: *Deleted

## 2015-07-09 ENCOUNTER — Ambulatory Visit (HOSPITAL_BASED_OUTPATIENT_CLINIC_OR_DEPARTMENT_OTHER): Payer: 59 | Admitting: Oncology

## 2015-07-09 ENCOUNTER — Other Ambulatory Visit (HOSPITAL_BASED_OUTPATIENT_CLINIC_OR_DEPARTMENT_OTHER): Payer: 59

## 2015-07-09 ENCOUNTER — Telehealth: Payer: Self-pay | Admitting: Oncology

## 2015-07-09 VITALS — BP 124/72 | HR 83 | Temp 98.1°F | Resp 18 | Ht 65.0 in | Wt 229.5 lb

## 2015-07-09 VITALS — BP 111/68 | HR 83 | Temp 98.6°F | Ht 65.0 in | Wt 229.8 lb

## 2015-07-09 DIAGNOSIS — Z510 Encounter for antineoplastic radiation therapy: Secondary | ICD-10-CM | POA: Diagnosis present

## 2015-07-09 DIAGNOSIS — D701 Agranulocytosis secondary to cancer chemotherapy: Secondary | ICD-10-CM

## 2015-07-09 DIAGNOSIS — C50311 Malignant neoplasm of lower-inner quadrant of right female breast: Secondary | ICD-10-CM | POA: Insufficient documentation

## 2015-07-09 DIAGNOSIS — Z5111 Encounter for antineoplastic chemotherapy: Secondary | ICD-10-CM

## 2015-07-09 DIAGNOSIS — T451X5A Adverse effect of antineoplastic and immunosuppressive drugs, initial encounter: Secondary | ICD-10-CM

## 2015-07-09 DIAGNOSIS — Z171 Estrogen receptor negative status [ER-]: Secondary | ICD-10-CM | POA: Diagnosis not present

## 2015-07-09 DIAGNOSIS — G620 Drug-induced polyneuropathy: Secondary | ICD-10-CM

## 2015-07-09 LAB — COMPREHENSIVE METABOLIC PANEL
ALBUMIN: 3.6 g/dL (ref 3.5–5.0)
ALT: 27 U/L (ref 0–55)
AST: 19 U/L (ref 5–34)
Alkaline Phosphatase: 65 U/L (ref 40–150)
Anion Gap: 8 mEq/L (ref 3–11)
BUN: 13.2 mg/dL (ref 7.0–26.0)
CHLORIDE: 104 meq/L (ref 98–109)
CO2: 25 meq/L (ref 22–29)
Calcium: 9.2 mg/dL (ref 8.4–10.4)
Creatinine: 0.8 mg/dL (ref 0.6–1.1)
EGFR: >90 mL/min/{1.73_m2} (ref >90)
GLUCOSE: 115 mg/dL (ref 70–140)
POTASSIUM: 3.7 meq/L (ref 3.5–5.1)
SODIUM: 136 meq/L (ref 136–145)
Total Bilirubin: 0.58 mg/dL (ref 0.20–1.20)
Total Protein: 7.2 g/dL (ref 6.4–8.3)

## 2015-07-09 LAB — CBC WITH DIFFERENTIAL/PLATELET
BASO%: 0.3 % (ref 0.0–2.0)
BASOS ABS: 0 10*3/uL (ref 0.0–0.1)
EOS%: 1.4 % (ref 0.0–7.0)
Eosinophils Absolute: 0.1 10*3/uL (ref 0.0–0.5)
HCT: 30.9 % — ABNORMAL LOW (ref 34.8–46.6)
HEMOGLOBIN: 9.6 g/dL — AB (ref 11.6–15.9)
LYMPH%: 30.7 % (ref 14.0–49.7)
MCH: 25.2 pg (ref 25.1–34.0)
MCHC: 30.9 g/dL — AB (ref 31.5–36.0)
MCV: 81.5 fL (ref 79.5–101.0)
MONO#: 0.3 10*3/uL (ref 0.1–0.9)
MONO%: 7.6 % (ref 0.0–14.0)
NEUT#: 2.4 10*3/uL (ref 1.5–6.5)
NEUT%: 60 % (ref 38.4–76.8)
Platelets: 318 10*3/uL (ref 145–400)
RBC: 3.8 10*6/uL (ref 3.70–5.45)
RDW: 17.4 % — AB (ref 11.2–14.5)
WBC: 3.9 10*3/uL (ref 3.9–10.3)
lymph#: 1.2 10*3/uL (ref 0.9–3.3)

## 2015-07-09 NOTE — Addendum Note (Signed)
Encounter addended by: Malena Edman, RN on: 07/09/2015  3:25 PM<BR>     Documentation filed: Charges VN

## 2015-07-09 NOTE — Telephone Encounter (Signed)
Gave patient avs report and appointments for May. pof not sent - obtained from flow sheets in office notes.

## 2015-07-09 NOTE — Progress Notes (Signed)
   Department of Radiation Oncology  Phone:  [phone] Fax:        [phone]   Name: Susan COMUNALE MRN: [id number]  DOB: 03/20/1976  Date: 07/09/2015  Follow Up Visit Note  Diagnosis: Breast cancer of lower-inner quadrant of right female breast [facility])   Staging form: Breast, AJCC 7th Edition     Clinical stage from 12/03/2014: Stage IIA (T2, N0, M0) - Unsigned       Staging comments: Staged at breast conference on 7.20.16  T1b N0 IDC of the Right Breast  Interval History: Susan Wilson presents today for routine followup.  She finished neo-adjuvant chemotherapy. Her follow up MRI on 05/15/15 showed a good response to the breast. The skin and pectoralis muscle involvement had resolved. The mass at the time of MRI 4.8 x 1.8 x 1.8 cm. The cortical thickening of the right axillary lymph nodes also improved. These were biopsied initially and were negative. She underwent lumpectomy and sentinel lymph node biopsy on 06/15/15. This showed a 0.9 cm area of IDC her margins were negative and 4 sentinel lymph nodes were negative. She has been referred to PT. She saw Dr.Marginat earlier today. She is triple negative. She also went back for surgical reduction on 06/23/15 (Dr.Thimmappa) and on today's visit is unable to fully raise her right arm.    Physical Exam:  Filed Vitals:   07/09/15 1101  BP: 111/68  Pulse: 83  Temp: 98.6 F (37 C)  TempSrc: Oral  Height: 5' 5"  (1.651 m)  Weight: 229 lb 12.8 oz (104.237 kg)  SpO2: 100%   S/p bilateral breast reduction. Right breast slightly smaller than left. Incision healing well with no signs of infection. Decreased range of motion of the right arm   IMPRESSION: Susan Wilson is a 40 y.o. female with T1b N0 IDC of the Right Breast  PLAN:  We discussed the role of radiation and decreasing local failures in patients who undergo lumpectomy. We discussed the retrospective data showing an increase in failure rates even in patients who have a pathologic  complete response and did not undergo radiation. For this reason I have recommended radiation to the whole breast followed by boost to the tumor bed. We discussed the process of simulation the placement tattoos. We discussed possible side effects during treatment including but not limited to skin irritation darkness and fatigue. We discussed long-term effects of treatment which are extremely unlikely but possible including damage to the lungs and ribs. We discussed the low likelihood of secondary malignancies.   The patient will continue with PT and she will call the clinic to schedule her simulation once she is appropriately healed from surgery.   ------------------------------------------------  Thea Silversmith, MD  This document serves as a record of services personally performed by Thea Silversmith, MD. It was created on her behalf by Derek Mound, a trained medical scribe. The creation of this record is based on the scribe's personal observations and the provider's statements to them. This document has been checked and approved by the attending provider.

## 2015-07-09 NOTE — Progress Notes (Signed)
[place]  [phone] [phone]     ID: Susan Wilson DOB: [birth year]  MR#: [id number]  PRF#:[id number]  Patient Care Team: Donnamae Jude, MD as PCP - General (Obstetrics and Gynecology) Rolm Bookbinder, MD as Consulting Physician (General Surgery) Chauncey Cruel, MD as Consulting Physician (Oncology) Thea Silversmith, MD as Consulting Physician (Radiation Oncology) [name], RN as Registered Nurse Rockwell Germany, RN as Registered Nurse Holley Bouche, NP as Nurse Practitioner (Nurse Practitioner) PCP: Donnamae Jude, MD OTHER MD:  CHIEF COMPLAINT: Estrogen receptor negative breast cancer  CURRENT TREATMENT: Neoadjuvant chemotherapy  BREAST CANCER HISTORY: From the original intake note:  Susan Wilson herself noted a mass in her right breast sometime around April. Initially she thought it might be related to menstruation, but as it did not change and eventually became tender, she brought it to her physician's attention. On 11/20/2014 patient underwent bilateral diagnostic mammography with tomosynthesis and right breast ultrasonography at the breast Center. The breast density was category B. There was a hyperdense mass in the right lower inner quadrant associated with skin thickening. There was also a 5 mm nodule posteriorly at the 8:30 o'clock position in the right breast. There were several hyperdense nodules in the right axilla. On physical exam there was a firm fixed mass in the right breast at the 5:00 position. By ultrasound this was lobulated and appear to involve the skin. It measures up to 4.1 cm. There was no sonographic correlation to the 5 mm nodule seen in a different area of the right breast. The right axilla showed 3 hypervascular lymph nodes with prominent cortical thickening, measuring less than 1.5 cm.  On 11/21/2014 the patient underwent right breast biopsy (5:00 mass) and biopsy of one of the suspicious right  axillary lymph nodes. The pathology (SAA [id number]) showed the breast biopsy to consist of invasive ductal carcinoma, grade 2, estrogen receptor and progesterone receptor negative, with an MIB-1 of 20%, and HER-2 equivocal, with the signals ratio of 1.41, but the average copy number per cell 4.35.  The patient's subsequent history is as detailed below  INTERVAL HISTORY:  Susan Wilson returns today for follow up of her right-sided breast cancer, accompanied by her husband Antrell. Since her last visit here she proceeded to definitive right lumpectomy with sentinel lymph node sampling 06/15/2015. There was a 0.9 cm area of residual invasive ductal carcinoma, grade 1, in scattered nests of malignant glands, which were themselves microscopic. The longest distance from one gland to the other was 0.9 cm. This actually may over stage the tumor. There is not sufficient tumor present for repeat prognostic profile. A total of 4 sentinel lymph nodes were obtained, all clear. Margins were negative.  We did your thyroid, there is appreciated at 0 sugarless look at it for her second On 06/23/2015 she underwent left mammoplasty which was negative, and right mammoplasty which showed only fibrocystic changes with no evidence of malignancy ([id number])  Her case was also presented at the multidisciplinary breast cancer conference every 20 07/06/2015. At that time it was felt the patient would benefit from adjuvant radiation and physical therapy.  REVIEW OF SYSTEMS: Susan Wilson did well with the surgery, with no bleeding or fever problems and no dehiscence, but she still pretty sore and is still taking some Percocet. This is constipating her slightly. She also has pain in the right armpit area. She describes herself as fatigue, but has problems sleeping. She has not had any periods since early  in her chemotherapy. She is not having hot flashes at present. A detailed review of systems was otherwise stable  PAST MEDICAL  HISTORY: Past Medical History  Diagnosis Date  . Breast cancer of lower-inner quadrant of right female breast ([place]) 11/25/2014  . Breast cancer ([place])   . Hernia, umbilical   . Back pain   . Headache   . Anemia   . Hernia, umbilical   . Family history of breast cancer   . 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 Portacath placement Right 12/16/2014    Procedure: INSERTION PORT-A-CATH WITH ULTRASOUND;  Surgeon: Rolm Bookbinder, MD;  Location: [place];  Service: General;  Laterality: Right;  . Radioactive seed guided mastectomy with axillary sentinel lymph node biopsy Right 06/15/2015    Procedure: RADIOACTIVE SEED GUIDED PARTIAL MASTECTOMY WITH AXILLARY SENTINEL LYMPH NODE BIOPSY;  Surgeon: Rolm Bookbinder, MD;  Location: [place];  Service: General;  Laterality: Right;  . Breast reconstruction Right 06/23/2015    Procedure: ONCOPLASTIC RIGHT BREAST REDUCTION;  Surgeon: Irene Limbo, MD;  Location: [place];  Service: Plastics;  Laterality: Right;  . Breast reduction surgery Left 06/23/2015    Procedure: MAMMARY REDUCTION  (BREAST) LEFT BREAST FOR SYMETRY (BILATERAL BREAST REDUCTION);  Surgeon: Irene Limbo, MD;  Location: [place];  Service: Plastics;  Laterality: Left;    FAMILY HISTORY Family History  Problem Relation Age of Onset  . Mesothelioma Maternal Grandfather     asbestos exposure, died in his 71s  . Heart defect Sister 0  . Heart disease Paternal Aunt   . Heart disease Paternal Uncle   . Breast cancer Paternal Grandmother   . Diabetes Paternal Grandmother   . Cancer Paternal Grandfather     NOS  . Colon cancer Other 8    MGMs brother with colon cancer  . Cancer Other     several of MGF's sisters with cancer NOS  . Liver cancer Other     MGF's brother   the patient's parents are living, her father being 51 and her mother 75 as of July  2016. The patient had no brothers. One sister died at age 47 from cardiac problems. The other sister is in good health. On the maternal side there is a history of colon cancer and an uncle age 65, liver cancer in a great uncle and mesothelioma in the maternal grandfather.  GYNECOLOGIC HISTORY:  No LMP recorded. Patient is not currently having periods (Reason: Chemotherapy). Menarche age 51. The patient is GX P2. 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 The daughters are [place] and Lovie Macadamia, age 33 and 60. The patient attends a local [place]: Not in place   HEALTH MAINTENANCE: Social History  Substance Use Topics  . Smoking status: Never Smoker   . Smokeless tobacco: Never Used  . Alcohol Use: Yes     Comment: rarely     Colonoscopy:  PAP: 2014  Bone density:  Lipid panel:  No Known Allergies  Current Outpatient Prescriptions  Medication Sig Dispense Refill  . ibuprofen (ADVIL,MOTRIN) 200 MG tablet Take 400 mg by mouth every 6 (six) hours as needed (for pain). Reported on 05/14/2015    . lidocaine-prilocaine (EMLA) cream Apply topically as needed. 30 g 1  . LORazepam (ATIVAN) 0.5  MG tablet Take 1 tablet (0.5 mg total) by mouth at bedtime. 30 tablet 0  . ondansetron (ZOFRAN) 8 MG tablet TAKE 1 TABLET BY MOUTH TWICE DAILY AS NEEDED. START ON 3RD DAY AFTER CHEMOTHERAPY  0  . oxyCODONE-acetaminophen (PERCOCET) 10-325 MG tablet Take 1 tablet by mouth every 4 (four) hours as needed for pain. 50 tablet 0  . prochlorperazine (COMPAZINE) 10 MG tablet Take 1 tablet (10 mg total) by mouth every 6 (six) hours as needed for nausea or vomiting. 30 tablet 1  . tobramycin-dexamethasone (TOBRADEX) ophthalmic solution Place 1 drop into both eyes every 4 (four) hours while awake. 5 mL 0   No current facility-administered medications for this visit.      OBJECTIVE: Young African-American woman in no acute distress Filed Vitals:   07/09/15 0944  BP: 124/72  Pulse: 83  Temp: 98.1 F (36.7 C)  Resp: 18     Body mass index is 38.19 kg/(m^2).    ECOG FS:1 - Symptomatic but completely ambulatory  Sclerae unicteric, pupils round and equal Oropharynx clear and moist-- no thrush or other lesions No cervical or supraclavicular adenopathy Lungs no rales or rhonchi Heart regular rate and rhythm Abd soft, Obese,nontender, positive bowel sounds MSK no focal spinal tenderness, no upper extremity lymphedema Neuro: nonfocal, well oriented, appropriate affect Breasts: the right breast is status post lumpectomy and sentinel lymph node sampling. Both breasts are status post reduction mammoplasty. The left breast is 5-7% larger than the right. The overall appearance though is symmetrical. There is no dehiscence, erythema, or unusual swelling. Both axillae are benign.   LAB RESULTS:  CMP  CBC Latest Ref Rng 07/09/2015 05/14/2015 05/07/2015  WBC 3.9 - 10.3 10e3/uL 3.9 2.1(L) 2.7(L)  Hemoglobin 11.6 - 15.9 g/dL 9.6(L) 9.0(L) 9.1(L)  Hematocrit 34.8 - 46.6 % 30.9(L) 28.7(L) 28.9(L)  Platelets 145 - 400 10e3/uL 318 166 194   CMP Latest Ref Rng 07/09/2015 05/14/2015 05/07/2015  Glucose 70 - 140 mg/dl 115 124 100  BUN 7.0 - 26.0 mg/dL 13.2 12.1 14.8  Creatinine 0.6 - 1.1 mg/dL 0.8 0.8 0.8  Sodium 136 - 145 mEq/L 136 138 139  Potassium 3.5 - 5.1 mEq/L 3.7 4.1 4.3  CO2 22 - 29 mEq/L 25 26 26   Calcium 8.4 - 10.4 mg/dL 9.2 9.0 9.3  Total Protein 6.4 - 8.3 g/dL 7.2 7.1 7.0  Total Bilirubin 0.20 - 1.20 mg/dL 0.58 0.60 0.58  Alkaline Phos 40 - 150 U/L 65 64 65  AST 5 - 34 U/L 19 18 35(H)  ALT 0 - 55 U/L 27 40 65(H)      Urinalysis    Component Value Date/Time   LABSPEC 1.020 05/06/2009 1439   PHURINE 7.0 05/06/2009 1439   GLUCOSEU NEGATIVE 05/06/2009 1439   HGBUR NEGATIVE 05/06/2009 1439   BILIRUBINUR NEGATIVE 05/06/2009 1439   KETONESUR  NEGATIVE 05/06/2009 1439   PROTEINUR NEGATIVE 05/06/2009 1439   UROBILINOGEN 0.2 05/06/2009 1439   NITRITE NEGATIVE 05/06/2009 1439   LEUKOCYTESUR  05/06/2009 1439    NEGATIVE Biochemical Testing Only. Please order routine urinalysis from main lab if confirmatory testing is needed.    STUDIES: Nm Sentinel Node Inj-no Rpt (breast)  06/15/2015  CLINICAL DATA: right axillary sentinel node biopsy Sulfur colloid was injected intradermally by the nuclear medicine technologist for breast cancer sentinel node localization.   Mm Breast Surgical Specimen  06/15/2015  CLINICAL DATA:  Patient status post right breast lumpectomy. EXAM: SPECIMEN RADIOGRAPH OF THE RIGHT BREAST COMPARISON:  Previous exam(s).  FINDINGS: Status post excision of the right breast. The radioactive seed and biopsy marker clip are present, completely intact, and were marked for pathology. IMPRESSION: Specimen radiograph of the right breast. Electronically Signed   By: Lovey Newcomer M.D.   On: 06/15/2015 13:59   Mm Rt Radioactive Seed Loc Mammo Guide  06/12/2015  CLINICAL DATA:  Right breast cancer status post neoadjuvant therapy. Patient is here for reactive seed placement prior to right breast surgery. EXAM: MAMMOGRAPHIC GUIDED RADIOACTIVE SEED LOCALIZATION OF THE RIGHT BREAST COMPARISON:  Previous exam(s). FINDINGS: Patient presents for radioactive seed localization prior to right breast surgery. I met with the patient and we discussed the procedure of seed localization including benefits and alternatives. We discussed the high likelihood of a successful procedure. We discussed the risks of the procedure including infection, bleeding, tissue injury and further surgery. We discussed the low dose of radioactivity involved in the procedure. Informed, written consent was given. The usual time-out protocol was performed immediately prior to the procedure. Using mammographic guidance, sterile technique, 1% lidocaine and an I-125 radioactive seed,  biopsy clip was localized using a medial approach. The follow-up mammogram images confirm the seed in the expected location and were marked for Dr. Donne Hazel. Follow-up survey of the patient confirms presence of the radioactive seed. Order number of I-125 seed:  [id number]. Total activity: 8.921 millicuries Reference Date: April 27, 2015 The patient tolerated the procedure well and was released from the [place]. She was given instructions regarding seed removal. IMPRESSION: Radioactive seed localization right breast. No apparent complications. Electronically Signed   By: Abelardo Diesel M.D.   On: 06/12/2015 14:58    ASSESSMENT: 40 y.o. [place] woman s/p Right breast biopsy 11/21/2014 for a clinical T2 NX, stage 2 invasive ductal carcinoma, grade 2 or 3, estrogen and progesterone receptor negative, HER-2 equivocal, with an Mib-1 of 20%  (a) biopsy of a suspicious axillary lymph node same day was negative, but discordant  (b) review of 80 additional tumor cells by FISH still showed HER-2 not amplified; tumor should be treated as a triple negative  (1) neoadjuvant chemotherapy started 01/01/2015 consisting of cyclophosphamide and doxorubicin in dose dense fashion 4, with onpro support, completed 02/13/2015, followed by paclitaxel/ carboplatin weekly 12 started 02/26/2015   (2) genetics testing 12/15/2014 through the Breast/Ovarian gene panel offered by [facility] found no deleterious mutations in ATM, BARD1, BRCA1, BRCA2, BRIP1, CDH1, CHEK2, EPCAM, FANCC, MLH1, MSH2, MSH6, NBN, PALB2, PMS2, PTEN, RAD51C, RAD51D, TP53, and XRCC2  (3) right lumpectomy and sentinel lymph node sampling 06/15/2015 showed a residual pT1b pN0 invasive ductal carcinoma, grade 1, with negative margins. (There were actually a few microscopic nests of residual tumor and 0.9 is the longest distance between 2 of the microscopic nests--there was not enough tissue for repeat prognostic panel.)  (a) bilateral reduction mammoplasty  06/23/2015 showed no malignancy in either breast  (4) radiation to follow surgery  PLAN: Susan Wilson had a very good response to chemotherapy. She is now ready to proceed to radiation and already has an appointment today with Dr. Pablo Ledger. I expect she will receive between 6 and 7 weeks of treatment so I will see her again in early May.  Today we discussed the finding your new normal group and if she enrolls for the May course she will be ready to participate at that time.  Her husband has been in the [place] so he has a good understanding of the fact that certain experiences do change people deeply even though  they may outwardly look the same. He is familiar with concepts like posttraumatic stress disorder. I alerted both of them that the experience of women going through chemotherapy for breast cancer is very similar. They will let me know if any difficulties develop and if we need to intervene.  [name] is also already set to start physical therapy next week.  Once she is done with radiation she may have her port removed any time that is convenient. I have asked her to  Use the remaining TobraDex drops. She can then discontinue those. She will call with any other problems that may develop before her next visit here.  Chauncey Cruel, MD   07/09/2015 10:01 AM

## 2015-07-15 ENCOUNTER — Ambulatory Visit: Payer: 59 | Attending: General Surgery | Admitting: Physical Therapy

## 2015-07-15 DIAGNOSIS — M25611 Stiffness of right shoulder, not elsewhere classified: Secondary | ICD-10-CM

## 2015-07-15 DIAGNOSIS — M25612 Stiffness of left shoulder, not elsewhere classified: Secondary | ICD-10-CM

## 2015-07-15 DIAGNOSIS — R6889 Other general symptoms and signs: Secondary | ICD-10-CM | POA: Diagnosis present

## 2015-07-15 DIAGNOSIS — M7582 Other shoulder lesions, left shoulder: Secondary | ICD-10-CM | POA: Diagnosis present

## 2015-07-15 DIAGNOSIS — M25511 Pain in right shoulder: Secondary | ICD-10-CM | POA: Insufficient documentation

## 2015-07-15 DIAGNOSIS — Z9189 Other specified personal risk factors, not elsewhere classified: Secondary | ICD-10-CM | POA: Insufficient documentation

## 2015-07-15 NOTE — Therapy (Signed)
[place], [place], [address] Phone: [phone]   Fax:  [phone]  Physical Therapy Evaluation  Patient Details  Name: Susan Wilson MRN: [id number] Date of Birth: 1975-09-24 Referring Provider: Dr. Rolm Bookbinder (Dr. Iran Planas)  Encounter Date: 07/15/2015      PT End of Session - 07/15/15 1518    Visit Number 1   Number of Visits 9   Date for PT Re-Evaluation 08/15/15   PT Start Time 1025   PT Stop Time 1106   PT Time Calculation (min) 41 min   Activity Tolerance Patient tolerated treatment well   Behavior During Therapy [facility] for tasks assessed/performed      Past Medical History  Diagnosis Date  . Breast cancer of lower-inner quadrant of right female breast ([place]) 11/25/2014  . Breast cancer ([place])   . Hernia, umbilical   . Back pain   . Headache   . Anemia   . Hernia, umbilical   . Family history of breast cancer   . Anemia in neoplastic disease 04/02/2015    Past Surgical History  Procedure Laterality Date  . Portacath placement Right 12/16/2014    Procedure: INSERTION PORT-A-CATH WITH ULTRASOUND;  Surgeon: Rolm Bookbinder, MD;  Location: [place];  Service: General;  Laterality: Right;  . Radioactive seed guided mastectomy with axillary sentinel lymph node biopsy Right 06/15/2015    Procedure: RADIOACTIVE SEED GUIDED PARTIAL MASTECTOMY WITH AXILLARY SENTINEL LYMPH NODE BIOPSY;  Surgeon: Rolm Bookbinder, MD;  Location: [place];  Service: General;  Laterality: Right;  . Breast reconstruction Right 06/23/2015    Procedure: ONCOPLASTIC RIGHT BREAST REDUCTION;  Surgeon: Irene Limbo, MD;  Location: [place];  Service: Plastics;  Laterality: Right;  . Breast reduction surgery Left 06/23/2015    Procedure: MAMMARY REDUCTION  (BREAST) LEFT BREAST FOR SYMETRY (BILATERAL BREAST REDUCTION);  Surgeon: Irene Limbo, MD;  Location: [place];  Service: Plastics;  Laterality: Left;    There were no vitals filed for this visit.  Visit Diagnosis:  Decreased ROM of right shoulder - Plan: PT plan of care cert/re-cert  Impaired function of upper extremity - Plan: PT plan of care cert/re-cert  Decreased ROM of left shoulder - Plan: PT plan of care cert/re-cert  At risk for lymphedema - Plan: PT plan of care cert/re-cert      Subjective Assessment - 07/15/15 1028    Subjective "We're working on my arm movement because I had four lymph nodes removed when they did the surgery.  Arm is still stiff and very sore."   Pertinent History Diagnosed with right breast cancer in July 2016. Had neo-adjuvant chemo, then lumpectomy with 4 nodes removed 06/15/15, then 06/23/15 had reduction and reconstruction.  Will have radiation starting in the next couple weeks.  May need  more reduction on the left to even out left to right, after radiation,.  No other significant history.   Patient Stated Goals get the right arm working better--get full upward movement back   Currently in Pain? Yes   Pain Score 0-No pain  but up to 7 at times   Pain Location Axilla   Pain Orientation Right   Pain Descriptors / Indicators Aching;Sore   Aggravating Factors  after exercise, with moderate housework where she extends the arm out    Pain Relieving Factors uses cold pack on that side, pain meds            [facility] PT  Assessment - 07/15/15 0001    Assessment   Medical Diagnosis right breast cancer s/p lumpectomy and neo-adjuvant chemo   Referring Provider Dr. Rolm Bookbinder  Dr. Iran Planas   Onset Date/Surgical Date 06/15/15  and 06/23/15   Precautions   Precautions Other (comment)   Precaution Comments from plastic surgeon, just work within tolerance; cancer precautions; starting radiation soon   Restrictions   Weight Bearing Restrictions No   Balance Screen   Has the patient fallen in the past 6 months No   Has the patient had a decrease in  activity level because of a fear of falling?  No   Is the patient reluctant to leave their home because of a fear of falling?  No   Home Environment   Living Environment Private residence   Living Arrangements Spouse/significant other;Children  2 children, ages 4 and 90   Type of [place] One level   Prior Function   Level of Independence Independent   Vocation Other (comment)  on leave until finished with treatment   Vocation Requirements at computer, some walking; no heavy lifting   Leisure hasn't been able to exercise recently; used to go to the gym and do weight training, etc.   Had been walking 30 minutes a day, most days of the week (not in last couple)   Cognition   Overall Cognitive Status Within Functional Limits for tasks assessed   Observation/Other Assessments   Skin Integrity right axilla incision well healed; patient preferred not to have other incisions viewed due to discomfort, but she describes incisions at inferior aspect of breast and from nipple coming down to the bottom, that these are still scabbed and with glue in place, but no openings   Quick DASH  45.45   ROM / Strength   AROM / PROM / Strength AROM   AROM   AROM Assessment Site Shoulder   Right/Left Shoulder Right;Left   Right Shoulder Extension 37 Degrees   Right Shoulder Flexion 110 Degrees  sitting   Right Shoulder ABduction 83 Degrees   Right Shoulder Internal Rotation --  in supine, WFL   Right Shoulder External Rotation 73 Degrees   Left Shoulder Extension 44 Degrees   Left Shoulder Flexion 145 Degrees   Left Shoulder ABduction 130 Degrees   Left Shoulder Internal Rotation --  [facility]   Left Shoulder External Rotation 90 Degrees           LYMPHEDEMA/ONCOLOGY QUESTIONNAIRE - 07/15/15 1043    Type   Cancer Type right breast   Surgeries   Lumpectomy Date 06/15/15  reduction 06/23/15   Number Lymph Nodes Removed 4   Treatment   Past Chemotherapy Treatment Yes   Date 05/14/15    Lymphedema Assessments   Lymphedema Assessments Upper extremities   Right Upper Extremity Lymphedema   10 cm Proximal to Olecranon Process 39.5 cm   Olecranon Process 29.5 cm   10 cm Proximal to Ulnar Styloid Process 27.3 cm   Just Proximal to Ulnar Styloid Process 20.9 cm   Across Hand at [employer] 20.7 cm   At [place] of 2nd Digit 6.5 cm   Left Upper Extremity Lymphedema   10 cm Proximal to Olecranon Process 38.5 cm   Olecranon Process 29.5 cm   10 cm Proximal to Ulnar Styloid Process 26.9 cm   Just Proximal to Ulnar Styloid Process 20.5 cm   Across Hand at [employer] 20.1 cm   At [place] of  2nd Digit 6.6 cm           Quick Dash - 07/15/15 0001    Open a tight or new jar Moderate difficulty   Do heavy household chores (wash walls, wash floors) Severe difficulty   Carry a shopping bag or briefcase Moderate difficulty   Wash your back Moderate difficulty   Use a knife to cut food No difficulty   Recreational activities in which you take some force or impact through your arm, shoulder, or hand (golf, hammering, tennis) Moderate difficulty   During the past week, to what extent has your arm, shoulder or hand problem interfered with your normal social activities with family, friends, neighbors, or groups? Modererately   During the past week, to what extent has your arm, shoulder or hand problem limited your work or other regular daily activities Modererately   Arm, shoulder, or hand pain. Moderate   Tingling (pins and needles) in your arm, shoulder, or hand Moderate   Difficulty Sleeping Mild difficulty   DASH Score 45.45 %             [facility] Adult PT Treatment/Exercise - 07/15/15 0001    Self-Care   Self-Care Other Self-Care Comments   Other Self-Care Comments  Patient describes currently doing BMDC shoulder ROM exercises, at least twice a day   Exercises   Exercises Shoulder                        Long Term Clinic Goals - 07/15/15 1524    CC  Long Term Goal  #1   Title Patient will be able to get her right arm into position to allow her to get radiation treatments.   Baseline unable   Time 2   Period Weeks   Status New   CC Long Term Goal  #2   Title Right shoulder active flexion to at least 145 degrees for improved overhead reach.   Baseline 110 degrees right compared to 145 left.   Time 4   Period Weeks   Status New   CC Long Term Goal  #3   Title Right shoulder active abduction to at least 130 degrees for improved ADLs.   Baseline 83 degrees right compared to 130 left.   Time 4   Period Weeks   Status New   CC Long Term Goal  #4   Title Right shoulder external rotation to 90 degrees.   Baseline 73 right, 90 left   Time 4   Period Weeks   Status New   CC Long Term Goal  #5   Title Left shoulder abduction to at least 150 degrees for improved ADLs.   Baseline 130 degrees   Time 4   Period Weeks   Status New   CC Long Term Goal  #6   Title quick DASH score reduced to 30 or less   Baseline 45   Time 4   Period Weeks   Status New   Additional Goals   Additional Goals Yes            Plan - 07/15/15 1518    Clinical Impression Statement Pleasant woman s/p right breast lumpectomy and left breast reduction; had 4 lymph nodes removed and will have radiation treatment.  She has some mild limitations in left shoulder AROM and signficant limitations in right shoulder AROM.  Her quick DASH score is high at 45.45.  She has a slight risk for right upper quadrant lymphedema.  Currently the right upper arm at 10 cm. proximal to olecranon measures 1 cm. larger than left in circumference.   Pt will benefit from skilled therapeutic intervention in order to improve on the following deficits Decreased range of motion;Impaired UE functional use;Decreased knowledge of precautions;Decreased knowledge of use of DME   Rehab Potential Excellent   Clinical Impairments Affecting Rehab Potential will start radiation soon; she may not  want to continue after that   PT Frequency 2x / week   PT Duration 4 weeks  may want to do only 2 weeks due to XRT   PT Treatment/Interventions ADLs/Self Care Home Management;Electrical Stimulation;DME Instruction;Therapeutic exercise;Patient/family education;Manual techniques;Passive range of motion;Manual lymph drainage   PT Next Visit Plan Begin P/AA/AROM with focus on right shoulder, but also for left.  Progress HEP for ROM.  Focus on positioning right UE for radiation.  Later, educate about lymphedema risk reduction or have her attend ABC class.     PT Home Exercise Plan Continue BMDC shoulder ROM stretches.   Consulted and Agree with Plan of Care Patient         Problem List Patient Active Problem List   Diagnosis Date Noted  . Chemotherapy induced neutropenia ([place]) 05/14/2015  . Chemotherapy-induced neuropathy ([place]) 04/08/2015  . 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 Tooth ache 01/09/2015  . Genetic testing 12/26/2014  . Family history of breast cancer   . Breast cancer of lower-inner quadrant of right female breast ([place]) 11/25/2014  . Migraine 11/19/2014    SALISBURY,DONNA 07/15/2015, 3:37 PM  [place], [place], [address] Phone: [phone]   Fax:  [phone]  Name: Susan Wilson MRN: [id number] Date of Birth: 1976/01/18   Serafina Royals, PT 07/15/2015 3:37 PM

## 2015-07-17 ENCOUNTER — Ambulatory Visit: Payer: 59 | Admitting: Physical Therapy

## 2015-07-17 DIAGNOSIS — M25611 Stiffness of right shoulder, not elsewhere classified: Secondary | ICD-10-CM

## 2015-07-17 DIAGNOSIS — M25612 Stiffness of left shoulder, not elsewhere classified: Secondary | ICD-10-CM

## 2015-07-17 DIAGNOSIS — M25511 Pain in right shoulder: Secondary | ICD-10-CM | POA: Diagnosis not present

## 2015-07-17 DIAGNOSIS — R6889 Other general symptoms and signs: Secondary | ICD-10-CM

## 2015-07-17 NOTE — Therapy (Signed)
[place], [place], [address] Phone: [phone]   Fax:  [phone]  Physical Therapy Treatment  Patient Details  Name: Susan Wilson MRN: [id number] Date of Birth: 1975/11/02 Referring Provider: Dr. Rolm Bookbinder (Dr. Iran Planas)  Encounter Date: 07/17/2015      PT End of Session - 07/17/15 1147    Visit Number 2   Number of Visits 9   Date for PT Re-Evaluation 08/15/15   PT Start Time 1109   PT Stop Time 1154   PT Time Calculation (min) 45 min   Activity Tolerance Patient tolerated treatment well;Patient limited by pain   Behavior During Therapy [facility] for tasks assessed/performed      Past Medical History  Diagnosis Date  . Breast cancer of lower-inner quadrant of right female breast ([place]) 11/25/2014  . Breast cancer ([place])   . Hernia, umbilical   . Back pain   . Headache   . Anemia   . Hernia, umbilical   . Family history of breast cancer   . Anemia in neoplastic disease 04/02/2015    Past Surgical History  Procedure Laterality Date  . Portacath placement Right 12/16/2014    Procedure: INSERTION PORT-A-CATH WITH ULTRASOUND;  Surgeon: Rolm Bookbinder, MD;  Location: [place];  Service: General;  Laterality: Right;  . Radioactive seed guided mastectomy with axillary sentinel lymph node biopsy Right 06/15/2015    Procedure: RADIOACTIVE SEED GUIDED PARTIAL MASTECTOMY WITH AXILLARY SENTINEL LYMPH NODE BIOPSY;  Surgeon: Rolm Bookbinder, MD;  Location: [place];  Service: General;  Laterality: Right;  . Breast reconstruction Right 06/23/2015    Procedure: ONCOPLASTIC RIGHT BREAST REDUCTION;  Surgeon: Irene Limbo, MD;  Location: [place];  Service: Plastics;  Laterality: Right;  . Breast reduction surgery Left 06/23/2015    Procedure: MAMMARY REDUCTION  (BREAST) LEFT BREAST FOR SYMETRY (BILATERAL BREAST REDUCTION);  Surgeon: Irene Limbo, MD;   Location: [place];  Service: Plastics;  Laterality: Left;    There were no vitals filed for this visit.  Visit Diagnosis:  Decreased ROM of right shoulder  Impaired function of upper extremity  Decreased ROM of left shoulder      Subjective Assessment - 07/17/15 1108    Subjective Just a little sore today--that's normal.   Currently in Pain? Yes   Pain Score 3    Pain Location Arm  forearm   Pain Orientation Right   Pain Descriptors / Indicators Sore   Aggravating Factors  cold weather   Pain Relieving Factors keeping it warm, wrapping in an Ace wrap, Arnica gel, using heat on it                         [facility] Adult PT Treatment/Exercise - 07/17/15 0001    Shoulder Exercises: Supine   Horizontal ABduction AROM;Both  over towel roll x 10, 5 second holds   Other Supine Exercises supine over towel roll, arms at sides, press arms into mat and squeeze shoulder blades together, 3 count hold x 10   Shoulder Exercises: Seated   Other Seated Exercises shoulder rolls backward x 5   Shoulder Exercises: Pulleys   Flexion 3 minutes   ABduction 3 minutes   Manual Therapy   Manual Therapy Passive ROM;Myofascial release;Other (comment);Scapular mobilization   Myofascial Release Right UE myofascial pulling in supine with motion into abduction.   Scapular Mobilization right scapula in left sidelying with right arm in  various degrees of flexion, for scapular protraction and depression   Passive ROM In supine, for right shoulder into ER, abduction, and flexion; used distraction for comfort.  Supine over towel roll, pect minor stretches.   Neural Stretch right UE   Prosthetics   Current prosthetic wear tolerance (#hours/day)                           Long Term Clinic Goals - 07/15/15 1524    CC Long Term Goal  #1   Title Patient will be able to get her right arm into position to allow her to get radiation treatments.   Baseline unable    Time 2   Period Weeks   Status New   CC Long Term Goal  #2   Title Right shoulder active flexion to at least 145 degrees for improved overhead reach.   Baseline 110 degrees right compared to 145 left.   Time 4   Period Weeks   Status New   CC Long Term Goal  #3   Title Right shoulder active abduction to at least 130 degrees for improved ADLs.   Baseline 83 degrees right compared to 130 left.   Time 4   Period Weeks   Status New   CC Long Term Goal  #4   Title Right shoulder external rotation to 90 degrees.   Baseline 73 right, 90 left   Time 4   Period Weeks   Status New   CC Long Term Goal  #5   Title Left shoulder abduction to at least 150 degrees for improved ADLs.   Baseline 130 degrees   Time 4   Period Weeks   Status New   CC Long Term Goal  #6   Title quick DASH score reduced to 30 or less   Baseline 45   Time 4   Period Weeks   Status New   Additional Goals   Additional Goals Yes            Plan - 07/17/15 1148    Clinical Impression Statement Patient with moderately limited tolerance to PROM for right shoulder, but was able to do session today to her tolerance and feel looser but no increased pain at the end of it.     Pt will benefit from skilled therapeutic intervention in order to improve on the following deficits Decreased range of motion;Impaired UE functional use;Decreased knowledge of precautions;Decreased knowledge of use of DME   Rehab Potential Excellent   Clinical Impairments Affecting Rehab Potential will start radiation soon; she may not want to continue after that   PT Frequency 2x / week   PT Duration 4 weeks  may only want to do 2 weeks due to XRT   PT Treatment/Interventions Therapeutic exercise;Passive range of motion;Manual techniques   PT Next Visit Plan P/AA/AROM for right shoulder (left into abduction prn).  Try radiation position and stretching there.  Later, educate about lymphedema risk reduction or have her attend ABC class.   PT  Home Exercise Plan Continue BMDC shoulder ROM stretches.   Consulted and Agree with Plan of Care Patient        Problem List Patient Active Problem List   Diagnosis Date Noted  . Chemotherapy induced neutropenia ([place]) 05/14/2015  . Chemotherapy-induced neuropathy ([place]) 04/08/2015  . 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 Tooth ache 01/09/2015  . Genetic testing 12/26/2014  . Family history of breast cancer   .  Breast cancer of lower-inner quadrant of right female breast ([place]) 11/25/2014  . Migraine 11/19/2014    Beyla Loney 07/17/2015, 11:57 AM  [place], [place], [address] Phone: [phone]   Fax:  [phone]  Name: Susan Wilson MRN: [id number] Date of Birth: April 16, 1976    Serafina Royals, PT 07/17/2015 11:57 AM

## 2015-07-20 ENCOUNTER — Other Ambulatory Visit: Payer: Self-pay | Admitting: *Deleted

## 2015-07-20 ENCOUNTER — Ambulatory Visit: Payer: 59 | Admitting: Physical Therapy

## 2015-07-20 DIAGNOSIS — C50311 Malignant neoplasm of lower-inner quadrant of right female breast: Secondary | ICD-10-CM

## 2015-07-22 ENCOUNTER — Ambulatory Visit: Payer: 59 | Admitting: Physical Therapy

## 2015-07-22 DIAGNOSIS — M25611 Stiffness of right shoulder, not elsewhere classified: Secondary | ICD-10-CM

## 2015-07-22 DIAGNOSIS — M25612 Stiffness of left shoulder, not elsewhere classified: Secondary | ICD-10-CM

## 2015-07-22 DIAGNOSIS — R6889 Other general symptoms and signs: Secondary | ICD-10-CM

## 2015-07-22 DIAGNOSIS — M25511 Pain in right shoulder: Secondary | ICD-10-CM | POA: Diagnosis not present

## 2015-07-22 NOTE — Therapy (Signed)
[place], [place], [address] Phone: [phone]   Fax:  [phone]  Physical Therapy Treatment  Patient Details  Name: Susan Wilson MRN: [id number] Date of Birth: 1975/11/19 Referring Provider: Dr. Rolm Bookbinder (Dr. Iran Planas)  Encounter Date: 07/22/2015    Past Medical History  Diagnosis Date  . Breast cancer of lower-inner quadrant of right female breast ([place]) 11/25/2014  . Breast cancer ([place])   . Hernia, umbilical   . Back pain   . Headache   . Anemia   . Hernia, umbilical   . Family history of breast cancer   . Anemia in neoplastic disease 04/02/2015    Past Surgical History  Procedure Laterality Date  . Portacath placement Right 12/16/2014    Procedure: INSERTION PORT-A-CATH WITH ULTRASOUND;  Surgeon: Rolm Bookbinder, MD;  Location: [place];  Service: General;  Laterality: Right;  . Radioactive seed guided mastectomy with axillary sentinel lymph node biopsy Right 06/15/2015    Procedure: RADIOACTIVE SEED GUIDED PARTIAL MASTECTOMY WITH AXILLARY SENTINEL LYMPH NODE BIOPSY;  Surgeon: Rolm Bookbinder, MD;  Location: [place];  Service: General;  Laterality: Right;  . Breast reconstruction Right 06/23/2015    Procedure: ONCOPLASTIC RIGHT BREAST REDUCTION;  Surgeon: Irene Limbo, MD;  Location: [place];  Service: Plastics;  Laterality: Right;  . Breast reduction surgery Left 06/23/2015    Procedure: MAMMARY REDUCTION  (BREAST) LEFT BREAST FOR SYMETRY (BILATERAL BREAST REDUCTION);  Surgeon: Irene Limbo, MD;  Location: [place];  Service: Plastics;  Laterality: Left;    There were no vitals filed for this visit.  Visit Diagnosis:  Decreased ROM of right shoulder  Impaired function of upper extremity  Decreased ROM of left shoulder      Subjective Assessment - 07/22/15 0850    Subjective right forearm was really sore the  last few days; didn't think we overdid the exercise at last session, but noticed it later; did some housework Saturday and noticed it Saturday evening   Currently in Pain? Yes   Pain Score 2    Pain Location Arm  forearm   Pain Orientation Right   Pain Descriptors / Indicators Sore   Aggravating Factors  therapy session; activity over the weekend   Pain Relieving Factors soaking it, wrapping in ACE wrap, gel            [facility] PT Assessment - 07/22/15 0001    AROM   Right Shoulder Flexion 127 Degrees   Right Shoulder ABduction 160 Degrees   Left Shoulder ABduction 170 Degrees                     [facility] Adult PT Treatment/Exercise - 07/22/15 0001    Shoulder Exercises: Standing   Other Standing Exercises wall finger ladder for right shoulder abduction x 5; same on left 3x  to rung #26 bilat.   Shoulder Exercises: Pulleys   Flexion 3 minutes   ABduction 3 minutes   Shoulder Exercises: Therapy Ball   Flexion 10 reps   Manual Therapy   Myofascial Release In left sidelying, pull gently on right arm in 90 degrees flexion with concurrent stretch inferiorly on left flank; crosshands just superior and inferior to right axilla   Scapular Mobilization right scapula in left sidelying with right arm in various degrees of flexion, for scapular protraction and depression   Passive ROM In supine, for right shoulder into ER, abduction, and flexion; used distraction for  comfort.                         Long Term Clinic Goals - 07/22/15 1312    CC Long Term Goal  #2   Title Right shoulder active flexion to at least 145 degrees for improved overhead reach.   Baseline 110 degrees right compared to 145 left at eval; 127 on 07/22/15 on right   Status Partially Met   CC Long Term Goal  #3   Title Right shoulder active abduction to at least 130 degrees for improved ADLs.   Status Achieved   CC Long Term Goal  #4   Title Right shoulder external rotation to 90 degrees.    Baseline 73 right, 90 leftat eval; 80 degrees right on 07/22/15   Status Partially Met   CC Long Term Goal  #5   Title Left shoulder abduction to at least 150 degrees for improved ADLs.   Status Achieved   CC Long Term Goal  #6   Title quick DASH score reduced to 30 or less   Status On-going            Problem List Patient Active Problem List   Diagnosis Date Noted  . Chemotherapy induced neutropenia ([place]) 05/14/2015  . Chemotherapy-induced neuropathy ([place]) 04/08/2015  . 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 Tooth ache 01/09/2015  . Genetic testing 12/26/2014  . Family history of breast cancer   . Breast cancer of lower-inner quadrant of right female breast ([place]) 11/25/2014  . Migraine 11/19/2014    SALISBURY,DONNA 07/22/2015, [place], [place], [address] Phone: [phone]   Fax:  [phone]  Name: Susan Wilson MRN: [id number] Date of Birth: 03-13-1976    Serafina Royals, PT 07/22/2015 5:02 PM

## 2015-07-24 ENCOUNTER — Encounter: Payer: Self-pay | Admitting: Oncology

## 2015-07-24 NOTE — Progress Notes (Signed)
Reviewed billing. Called [employer] again and spoke with [name],never received POE and award letter for patient.She states she can send the POE and cover sheet but the would have to put in a request to have award letter sent. Called patient to see if she has a copy of it and can bring with her on Mon 07/27/15. If she can find it, she will bring it.

## 2015-07-27 ENCOUNTER — Ambulatory Visit: Payer: 59 | Admitting: Physical Therapy

## 2015-07-27 ENCOUNTER — Other Ambulatory Visit (HOSPITAL_BASED_OUTPATIENT_CLINIC_OR_DEPARTMENT_OTHER): Payer: 59

## 2015-07-27 ENCOUNTER — Encounter: Payer: Self-pay | Admitting: Oncology

## 2015-07-27 DIAGNOSIS — R6889 Other general symptoms and signs: Secondary | ICD-10-CM

## 2015-07-27 DIAGNOSIS — C50311 Malignant neoplasm of lower-inner quadrant of right female breast: Secondary | ICD-10-CM

## 2015-07-27 DIAGNOSIS — M25612 Stiffness of left shoulder, not elsewhere classified: Secondary | ICD-10-CM

## 2015-07-27 DIAGNOSIS — M25611 Stiffness of right shoulder, not elsewhere classified: Secondary | ICD-10-CM

## 2015-07-27 DIAGNOSIS — M25511 Pain in right shoulder: Secondary | ICD-10-CM | POA: Diagnosis not present

## 2015-07-27 LAB — COMPREHENSIVE METABOLIC PANEL
ALT: 22 U/L (ref 0–55)
AST: 17 U/L (ref 5–34)
Albumin: 3.8 g/dL (ref 3.5–5.0)
Alkaline Phosphatase: 70 U/L (ref 40–150)
Anion Gap: 6 mEq/L (ref 3–11)
BUN: 13.7 mg/dL (ref 7.0–26.0)
CHLORIDE: 105 meq/L (ref 98–109)
CO2: 27 mEq/L (ref 22–29)
CREATININE: 0.8 mg/dL (ref 0.6–1.1)
Calcium: 9 mg/dL (ref 8.4–10.4)
EGFR: >90 mL/min/{1.73_m2} (ref >90)
GLUCOSE: 93 mg/dL (ref 70–140)
POTASSIUM: 3.9 meq/L (ref 3.5–5.1)
Sodium: 138 mEq/L (ref 136–145)
Total Bilirubin: 0.67 mg/dL (ref 0.20–1.20)
Total Protein: 7.1 g/dL (ref 6.4–8.3)

## 2015-07-27 LAB — LIPID PANEL
CHOL/HDL RATIO: 4 ratio (ref 0.0–4.4)
Cholesterol, Total: 137 mg/dL (ref 100–199)
HDL: 34 mg/dL — AB (ref >39)
LDL Calculated: 92 mg/dL (ref 0–99)
TRIGLYCERIDES: 57 mg/dL (ref 0–149)
VLDL Cholesterol Cal: 11 mg/dL (ref 5–40)

## 2015-07-27 LAB — CBC WITH DIFFERENTIAL/PLATELET
BASO%: 0.3 % (ref 0.0–2.0)
BASOS ABS: 0 10*3/uL (ref 0.0–0.1)
EOS%: 1 % (ref 0.0–7.0)
Eosinophils Absolute: 0.1 10*3/uL (ref 0.0–0.5)
HEMATOCRIT: 34.7 % — AB (ref 34.8–46.6)
HGB: 10.5 g/dL — ABNORMAL LOW (ref 11.6–15.9)
LYMPH%: 23.8 % (ref 14.0–49.7)
MCH: 24 pg — AB (ref 25.1–34.0)
MCHC: 30.2 g/dL — AB (ref 31.5–36.0)
MCV: 79.6 fL (ref 79.5–101.0)
MONO#: 0.4 10*3/uL (ref 0.1–0.9)
MONO%: 8 % (ref 0.0–14.0)
NEUT#: 3.5 10*3/uL (ref 1.5–6.5)
NEUT%: 66.9 % (ref 38.4–76.8)
Platelets: 260 10*3/uL (ref 145–400)
RBC: 4.36 10*6/uL (ref 3.70–5.45)
RDW: 16.9 % — ABNORMAL HIGH (ref 11.2–14.5)
WBC: 5.2 10*3/uL (ref 3.9–10.3)
lymph#: 1.2 10*3/uL (ref 0.9–3.3)

## 2015-07-27 NOTE — Progress Notes (Signed)
Received award letter and POE from [employer] via fax. Emailed to [employer] in billing.

## 2015-07-27 NOTE — Therapy (Signed)
[place], [place], [address] Phone: [phone]   Fax:  [phone]  Physical Therapy Treatment  Patient Details  Name: Susan Wilson MRN: [id number] Date of Birth: Jan 27, 1976 Referring Provider: Dr. Rolm Bookbinder (Dr. Iran Planas)  Encounter Date: 07/27/2015      PT End of Session - 07/27/15 1013    Visit Number 3   Number of Visits 9   Date for PT Re-Evaluation 08/15/15   PT Start Time 0933   PT Stop Time 1013   PT Time Calculation (min) 40 min   Activity Tolerance Patient tolerated treatment well   Behavior During Therapy [facility] for tasks assessed/performed      Past Medical History  Diagnosis Date  . Breast cancer of lower-inner quadrant of right female breast ([place]) 11/25/2014  . Breast cancer ([name])   . Hernia, umbilical   . Back pain   . Headache   . Anemia   . Hernia, umbilical   . Family history of breast cancer   . Anemia in neoplastic disease 04/02/2015    Past Surgical History  Procedure Laterality Date  . Portacath placement Right 12/16/2014    Procedure: INSERTION PORT-A-CATH WITH ULTRASOUND;  Surgeon: Rolm Bookbinder, MD;  Location: [place];  Service: General;  Laterality: Right;  . Radioactive seed guided mastectomy with axillary sentinel lymph node biopsy Right 06/15/2015    Procedure: RADIOACTIVE SEED GUIDED PARTIAL MASTECTOMY WITH AXILLARY SENTINEL LYMPH NODE BIOPSY;  Surgeon: Rolm Bookbinder, MD;  Location: [place];  Service: General;  Laterality: Right;  . Breast reconstruction Right 06/23/2015    Procedure: ONCOPLASTIC RIGHT BREAST REDUCTION;  Surgeon: Irene Limbo, MD;  Location: [place];  Service: Plastics;  Laterality: Right;  . Breast reduction surgery Left 06/23/2015    Procedure: MAMMARY REDUCTION  (BREAST) LEFT BREAST FOR SYMETRY (BILATERAL BREAST REDUCTION);  Surgeon: Irene Limbo, MD;  Location: [place];  Service: Plastics;  Laterality: Left;    There were no vitals filed for this visit.  Visit Diagnosis:  Decreased ROM of right shoulder  Impaired function of upper extremity  Decreased ROM of left shoulder      Subjective Assessment - 07/27/15 0934    Subjective Got her pulleys; ordered them online, but hasn't got them put up yet.  Forearm soreness wasn't as bad over the last few days; still using cream and Ace wrap.  Noticing a little more ease with putting on her coat and less pain with reaching up into a cabinet.   Currently in Pain? No/denies                         [facility] Adult PT Treatment/Exercise - 07/27/15 0001    Shoulder Exercises: Seated   Other Seated Exercises shoulder rolls backward x 5; work to relax right upper traps   Shoulder Exercises: Sidelying   Other Sidelying Exercises in left sidelying, patient actively taking right arm into D2 pattern to the point of stretch x7   Shoulder Exercises: Standing   Other Standing Exercises wall finger ladder for right shoulder abduction x 5; same on left 5x  to rung #27 right, #28 left   Other Standing Exercises robber's pose, then active "W" with both arms on wall x 5;   Shoulder Exercises: Pulleys   Flexion 2 minutes   ABduction 2 minutes   Shoulder Exercises: Therapy Ball   Flexion 10 reps   Shoulder Exercises:  Stretch   Other Shoulder Stretches hands on door jamb, 15 counts x 3   Manual Therapy   Myofascial Release In left sidelying, pull gently on right arm in 90 degrees flexion with concurrent stretch inferiorly on left flank; crosshands just superior and inferior to right axilla   Scapular Mobilization right scapula in left sidelying with right arm in various degrees of flexion, for scapular protraction and depression   Passive ROM In supine, for right shoulder into ER, abduction, and flexion; used distraction for comfort. For left shoulder into abduction.                         Long Term Clinic Goals - 07/22/15 1312    CC Long Term Goal  #2   Title Right shoulder active flexion to at least 145 degrees for improved overhead reach.   Baseline 110 degrees right compared to 145 left at eval; 127 on 07/22/15 on right   Status Partially Met   CC Long Term Goal  #3   Title Right shoulder active abduction to at least 130 degrees for improved ADLs.   Status Achieved   CC Long Term Goal  #4   Title Right shoulder external rotation to 90 degrees.   Baseline 73 right, 90 leftat eval; 80 degrees right on 07/22/15   Status Partially Met   CC Long Term Goal  #5   Title Left shoulder abduction to at least 150 degrees for improved ADLs.   Status Achieved   CC Long Term Goal  #6   Title quick DASH score reduced to 30 or less   Status On-going            Plan - 07/27/15 1013    Clinical Impression Statement Patient appears to be doing better, able to move a little farther with AAROM and PROM, though still with limitations  She reports some functional gains at home with increased ease getting into her coat and reaching up in the cupboards.  She did obtain some pulleys for use at home.  Right shoulder tends to stay elevated.   Pt will benefit from skilled therapeutic intervention in order to improve on the following deficits Decreased range of motion;Impaired UE functional use;Decreased knowledge of precautions;Decreased knowledge of use of DME   Rehab Potential Excellent   Clinical Impairments Affecting Rehab Potential will start radiation soon; she may not want to continue after that   PT Frequency 2x / week   PT Duration 4 weeks   PT Treatment/Interventions Therapeutic exercise;Passive range of motion;Manual techniques   PT Next Visit Plan Remeasure and check goals.  P/AA/AROM for right shoulder (left into abduction prn).  Try radiation position and stretching there.  Later, educate about lymphedema risk reduction or have her attend ABC class.   PT Home Exercise Plan  Continue BMDC shoulder ROM stretches.   Consulted and Agree with Plan of Care Patient        Problem List Patient Active Problem List   Diagnosis Date Noted  . Chemotherapy induced neutropenia ([place]) 05/14/2015  . Chemotherapy-induced neuropathy ([place]) 04/08/2015  . 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 Tooth ache 01/09/2015  . Genetic testing 12/26/2014  . Family history of breast cancer   . Breast cancer of lower-inner quadrant of right female breast ([place]) 11/25/2014  . Migraine 11/19/2014    SALISBURY,DONNA 07/27/2015, 10:17 AM  [place], [place], [address] Phone: [phone]  Fax:  [phone]  Name: ZEINAB RODWELL MRN: [id number] Date of Birth: 1975-07-18    Serafina Royals, PT 07/27/2015 10:17 AM

## 2015-07-29 ENCOUNTER — Ambulatory Visit: Payer: 59 | Admitting: Physical Therapy

## 2015-08-04 ENCOUNTER — Encounter: Payer: Self-pay | Admitting: *Deleted

## 2015-08-04 NOTE — Progress Notes (Signed)
Called pt to assess during PT. Relate doing well still "a little tired". Pt will be measured this week to determine if she is ready for xrt. Will call and inform if further PT is needed.

## 2015-08-05 ENCOUNTER — Ambulatory Visit: Payer: 59 | Admitting: Physical Therapy

## 2015-08-05 DIAGNOSIS — M25612 Stiffness of left shoulder, not elsewhere classified: Secondary | ICD-10-CM

## 2015-08-05 DIAGNOSIS — M25611 Stiffness of right shoulder, not elsewhere classified: Secondary | ICD-10-CM

## 2015-08-05 DIAGNOSIS — Z9189 Other specified personal risk factors, not elsewhere classified: Secondary | ICD-10-CM

## 2015-08-05 DIAGNOSIS — M25511 Pain in right shoulder: Secondary | ICD-10-CM | POA: Diagnosis not present

## 2015-08-05 DIAGNOSIS — R6889 Other general symptoms and signs: Secondary | ICD-10-CM

## 2015-08-05 NOTE — Patient Instructions (Signed)
[email or web address] Courses Online Strength After Breast Cancer Look at the right of the page for Lymphedema Education Session

## 2015-08-05 NOTE — Therapy (Signed)
[place], [place], [address] Phone: [phone]   Fax:  [phone]  Physical Therapy Treatment  Patient Details  Name: Susan Wilson MRN: [id number] Date of Birth: 1976/04/23 Referring Provider: Dr. Rolm Bookbinder (Dr. Iran Planas)  Encounter Date: 08/05/2015      PT End of Session - 08/05/15 1146    Visit Number 4   Number of Visits 9   Date for PT Re-Evaluation 08/15/15   PT Start Time 1100   PT Stop Time 1140   PT Time Calculation (min) 40 min   Activity Tolerance Patient tolerated treatment well      Past Medical History  Diagnosis Date  . Breast cancer of lower-inner quadrant of right female breast ([place]) 11/25/2014  . Breast cancer ([place])   . Hernia, umbilical   . Back pain   . Headache   . Anemia   . Hernia, umbilical   . Family history of breast cancer   . Anemia in neoplastic disease 04/02/2015    Past Surgical History  Procedure Laterality Date  . Portacath placement Right 12/16/2014    Procedure: INSERTION PORT-A-CATH WITH ULTRASOUND;  Surgeon: Rolm Bookbinder, MD;  Location: [place];  Service: General;  Laterality: Right;  . Radioactive seed guided mastectomy with axillary sentinel lymph node biopsy Right 06/15/2015    Procedure: RADIOACTIVE SEED GUIDED PARTIAL MASTECTOMY WITH AXILLARY SENTINEL LYMPH NODE BIOPSY;  Surgeon: Rolm Bookbinder, MD;  Location: [place];  Service: General;  Laterality: Right;  . Breast reconstruction Right 06/23/2015    Procedure: ONCOPLASTIC RIGHT BREAST REDUCTION;  Surgeon: Irene Limbo, MD;  Location: [facility];  Service: Plastics;  Laterality: Right;  . Breast reduction surgery Left 06/23/2015    Procedure: MAMMARY REDUCTION  (BREAST) LEFT BREAST FOR SYMETRY (BILATERAL BREAST REDUCTION);  Surgeon: Irene Limbo, MD;  Location: [place];  Service: Plastics;  Laterality: Left;    There  were no vitals filed for this visit.  Visit Diagnosis:  Decreased ROM of right shoulder  Decreased ROM of left shoulder  At risk for lymphedema  Impaired function of upper extremity      Subjective Assessment - 08/05/15 1116    Subjective pt had a suture come loose and had fluid gush out and but is scabbed over now with no leakage. Dr said it was probaly was left over from the surgery  She feels that her arm is much better.    Pertinent History Diagnosed with right breast cancer in July 2016. Had neo-adjuvant chemo, then lumpectomy with 4 nodes removed 06/15/15, then 06/23/15 had reduction and reconstruction.  Will have radiation starting in the next couple weeks.  May need  more reduction on the left to even out left to right, after radiation,.  No other significant history.   Patient Stated Goals get the right arm working better--get full upward movement back   Currently in Pain? No/denies            [facility] PT Assessment - 08/05/15 0001    AROM   Right Shoulder Flexion 150 Degrees   Right Shoulder ABduction 163 Degrees   Left Shoulder ABduction 170 Degrees              Quick Dash - 08/05/15 0001    Open a tight or new jar Mild difficulty   Do heavy household chores (wash walls, wash floors) Mild difficulty   Carry a shopping bag or briefcase Mild difficulty  Wash your back Mild difficulty   Use a knife to cut food Mild difficulty   Recreational activities in which you take some force or impact through your arm, shoulder, or hand (golf, hammering, tennis) Mild difficulty   During the past week, to what extent has your arm, shoulder or hand problem interfered with your normal social activities with family, friends, neighbors, or groups? Slightly   During the past week, to what extent has your arm, shoulder or hand problem limited your work or other regular daily activities Slightly   Arm, shoulder, or hand pain. Mild   Tingling (pins and needles) in your arm, shoulder, or  hand Mild   Difficulty Sleeping Mild difficulty   DASH Score 25 %                       PT Education - 08/05/15 1154    Education provided Yes   Education Details lymphedema risk reduction eduction online   Person(s) Educated Patient   Methods Other (comment);Handout  website    Comprehension Verbalized understanding                [facility] Goals - 08/05/15 1124    CC Long Term Goal  #1   Title Patient will be able to get her right arm into position to allow her to get radiation treatments.   Status Achieved   CC Long Term Goal  #2   Title Right shoulder active flexion to at least 145 degrees for improved overhead reach.   Baseline 110 degrees right compared to 145 left at eval; 127 on 07/22/15 on right  on 08/05/2015 has 150    Status Achieved   CC Long Term Goal  #3   Title Right shoulder active abduction to at least 130 degrees for improved ADLs.   Baseline 83 degrees right compared to 130 left. 163 degrees on 08/05/2015   Status Achieved   CC Long Term Goal  #4   Title Right shoulder external rotation to 90 degrees.   Baseline 73 right, 90 leftat eval; 80 degrees right on 07/22/15  90 degrees on 3/22/207    CC Long Term Goal  #5   Title Left shoulder abduction to at least 150 degrees for improved ADLs.   Baseline 130 degrees, 170 degrees on 08/05/2015   Status Achieved   CC Long Term Goal  #6   Title quick DASH score reduced to 30 or less   Baseline 45 at eval, 25 on discharge on 08/05/2015   Status Achieved   Additional Goals   Additional Goals Yes            Plan - 08/05/15 1155    Clinical Impression Statement Pt is pleased with her prgress especially that she can get her coat on without pain. Feel she should easily be able to get in position for radiation treatment. She has met all goals for this session and plans to continue with exercise at home.  She was educated on the importance of increasing the strength in her right upper quarter  once she is finished with radiation and cleared from all lifting restrictions.    Pt will benefit from skilled therapeutic intervention in order to improve on the following deficits Decreased range of motion;Impaired UE functional use;Decreased knowledge of precautions;Decreased knowledge of use of DME   Rehab Potential Excellent   Clinical Impairments Affecting Rehab Potential will start radiation soon; she may not want to continue after  that   PT Next Visit Plan Goals met.  Pt discharged from this episode    PT Home Exercise Plan Continue with shoulder stretches and exercises    Consulted and Agree with Plan of Care Patient        Problem List Patient Active Problem List   Diagnosis Date Noted  . Chemotherapy induced neutropenia ([place]) 05/14/2015  . Chemotherapy-induced neuropathy ([place]) 04/08/2015  . 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 Tooth ache 01/09/2015  . Genetic testing 12/26/2014  . Family history of breast cancer   . Breast cancer of lower-inner quadrant of right female breast ([place]) 11/25/2014  . Migraine 11/19/2014     PHYSICAL THERAPY DISCHARGE SUMMARY  Visits from Start of Care: 4  Current functional level related to goals / functional outcomes: As above, pt feels that she has achieved her goal of having her arm work better    Remaining deficits: Tightness in end range of right shoulder motion    Education / Equipment: Home exercise program, lymphedema risk reduction   Plan: Patient agrees to discharge.  Patient goals were met. Patient is being discharged due to meeting the stated rehab goals.  ?????       Donato Heinz. Owens Shark, PT  08/05/2015, 12:02 PM  [place], [place], [address] Phone: [phone]   Fax:  [phone]  Name: KASONDRA JUNOD MRN: [id number] Date of Birth: 04/14/1976

## 2015-08-07 ENCOUNTER — Encounter: Payer: Self-pay | Admitting: *Deleted

## 2015-08-17 ENCOUNTER — Encounter: Payer: Self-pay | Admitting: Oncology

## 2015-08-17 NOTE — Progress Notes (Signed)
fmla forms faxed 03/04/15 I sent to medical records

## 2015-08-18 ENCOUNTER — Ambulatory Visit
Admission: RE | Admit: 2015-08-18 | Discharge: 2015-08-18 | Disposition: A | Discharge status: Home / Self Care | Payer: 59 | Source: Ambulatory Visit | Attending: Radiation Oncology | Admitting: Radiation Oncology

## 2015-08-18 DIAGNOSIS — C50311 Malignant neoplasm of lower-inner quadrant of right female breast: Secondary | ICD-10-CM

## 2015-08-18 DIAGNOSIS — Z510 Encounter for antineoplastic radiation therapy: Secondary | ICD-10-CM | POA: Diagnosis not present

## 2015-08-18 NOTE — Progress Notes (Signed)
Radiation Oncology         [phone] ________________________________  Name: Susan Wilson      MRN: [id number]          Date: 08/18/2015              DOB: Aug 23, 1975  Optical Surface Tracking Plan:  Since intensity modulated radiotherapy (IMRT) and 3D conformal radiation treatment methods are predicated on accurate and precise positioning for treatment, intrafraction motion monitoring is medically necessary to ensure accurate and safe treatment delivery.  The ability to quantify intrafraction motion without excessive ionizing radiation dose can only be performed with optical surface tracking. Accordingly, surface imaging offers the opportunity to obtain 3D measurements of patient position throughout IMRT and 3D treatments without excessive radiation exposure.  I am ordering optical surface tracking for this patient's upcoming course of radiotherapy. ________________________________ Signature   Reference:   Ursula Alert, J, et al. Surface imaging-based analysis of intrafraction motion for breast radiotherapy patients.Journal of [place], n. 6, nov. 2014. ISSN [id number].   Available at: <http://www.jacmp.org/index.php/jacmp/article/view/4957>.

## 2015-08-18 NOTE — Progress Notes (Signed)
Name: Susan Wilson   MRN: [id number]  Date:  08/18/2015  DOB: 05-20-1975  Status:outpatient    DIAGNOSIS: Breast cancer.  CONSENT VERIFIED: yes   SET UP: Patient is setup supine   IMMOBILIZATION:  The following immobilization was used:Custom Moldable Pillow, breast board.   NARRATIVE: Ms. Counts was brought to the [facility].  Identity was confirmed.  All relevant records and images related to the planned course of therapy were reviewed.  Then, the patient was positioned in a stable reproducible clinical set-up for radiation therapy.  Wires were placed to delineate the clinical extent of breast tissue. A wire was placed on the scar as well.  CT images were obtained.  An isocenter was placed. Skin markings were placed.  The CT images were loaded into the planning software where the target and avoidance structures were contoured.  The radiation prescription was entered and confirmed. The patient was discharged in stable condition and tolerated simulation well.    TREATMENT PLANNING NOTE:  Treatment planning then occurred. I have requested : MLC's, isodose plan, basic dose calculation  I personally designed and supervised the construction of 3 medically necessary complex treatment devices for the protection of critical normal structures including the lungs and contralateral breast as well as the immobilization device which is necessary for set up certainty.   3D simulation occurred. I requested and analyzed a dose volume histogram of the heart, lungs and lumpectomy cavity.

## 2015-08-19 DIAGNOSIS — Z510 Encounter for antineoplastic radiation therapy: Secondary | ICD-10-CM | POA: Diagnosis not present

## 2015-08-25 ENCOUNTER — Ambulatory Visit: Payer: 59 | Admitting: Radiation Oncology

## 2015-08-26 ENCOUNTER — Ambulatory Visit: Payer: 59

## 2015-08-27 ENCOUNTER — Ambulatory Visit: Payer: 59

## 2015-08-28 ENCOUNTER — Encounter: Payer: Self-pay | Admitting: Oncology

## 2015-08-28 ENCOUNTER — Ambulatory Visit: Payer: 59

## 2015-08-28 NOTE — Progress Notes (Signed)
Advised [facility]..patient has radiation thru 10/15/15. Cloyde Reams had left mess to see if still having treatment. [phone]

## 2015-08-31 ENCOUNTER — Ambulatory Visit: Payer: 59

## 2015-08-31 ENCOUNTER — Encounter: Payer: Self-pay | Admitting: Oncology

## 2015-08-31 ENCOUNTER — Ambulatory Visit
Admission: RE | Admit: 2015-08-31 | Discharge: 2015-08-31 | Disposition: A | Discharge status: Home / Self Care | Payer: 59 | Source: Ambulatory Visit | Attending: Radiation Oncology | Admitting: Radiation Oncology

## 2015-08-31 DIAGNOSIS — Z510 Encounter for antineoplastic radiation therapy: Secondary | ICD-10-CM | POA: Diagnosis not present

## 2015-08-31 NOTE — Progress Notes (Signed)
Sent notes to [facility]  [phone]

## 2015-09-01 ENCOUNTER — Ambulatory Visit
Admission: RE | Admit: 2015-09-01 | Discharge: 2015-09-01 | Disposition: A | Discharge status: Home / Self Care | Payer: 59 | Source: Ambulatory Visit | Attending: Radiation Oncology | Admitting: Radiation Oncology

## 2015-09-01 ENCOUNTER — Encounter: Payer: Self-pay | Admitting: Radiation Oncology

## 2015-09-01 ENCOUNTER — Ambulatory Visit: Payer: 59

## 2015-09-01 VITALS — BP 140/96 | HR 74 | Temp 98.5°F | Resp 18 | Ht 65.0 in | Wt 234.3 lb

## 2015-09-01 DIAGNOSIS — Z923 Personal history of irradiation: Secondary | ICD-10-CM | POA: Insufficient documentation

## 2015-09-01 DIAGNOSIS — Z510 Encounter for antineoplastic radiation therapy: Secondary | ICD-10-CM | POA: Diagnosis not present

## 2015-09-01 DIAGNOSIS — C50911 Malignant neoplasm of unspecified site of right female breast: Secondary | ICD-10-CM | POA: Insufficient documentation

## 2015-09-01 DIAGNOSIS — C50311 Malignant neoplasm of lower-inner quadrant of right female breast: Secondary | ICD-10-CM

## 2015-09-01 MED ORDER — RADIAPLEXRX EX GEL
Freq: Once | CUTANEOUS | Status: AC
  Administered 2015-09-01: 18:00:00 via TOPICAL

## 2015-09-01 MED ORDER — ALRA NON-METALLIC DEODORANT (RAD-ONC)
1.0000 "application " | Freq: Once | TOPICAL | Status: AC
  Administered 2015-09-01: 1 via TOPICAL

## 2015-09-01 NOTE — Addendum Note (Signed)
Encounter addended by: Malena Edman, RN on: 09/01/2015  5:27 PM<BR>     Documentation filed: Visit Diagnoses, Dx Association, Orders

## 2015-09-01 NOTE — Addendum Note (Signed)
Encounter addended by: Malena Edman, RN on: 09/01/2015  5:41 PM<BR>     Documentation filed: Medications

## 2015-09-01 NOTE — Addendum Note (Signed)
Encounter addended by: Malena Edman, RN on: 09/01/2015  6:07 PM<BR>     Documentation filed: Inpatient MAR

## 2015-09-01 NOTE — Progress Notes (Signed)
Weekly Management Note Current Dose:  1.8 Gy  Projected Dose: 61 Gy   Narrative:  The patient presents for routine under treatment assessment.  CBCT/MVCT images/Port film x-rays were reviewed.  The chart was checked. Doing well. RN education performed.   Physical Findings: Weight: 234 lb 4.8 oz (106.278 kg).  No skin changes.   Impression:  The patient is tolerating radiation.  Plan:  Continue treatment as planned. Start radiaplex.

## 2015-09-01 NOTE — Addendum Note (Signed)
Encounter addended by: Malena Edman, RN on: 09/01/2015  5:25 PM<BR>     Documentation filed: Inpatient Patient Education

## 2015-09-01 NOTE — Progress Notes (Signed)
Susan Wilson has received 1 fraction to her right breast.  Right breast field looks normal.  Having some soreness around the nipple at times.   Radiaplex gel given and Alra deodorant with education.  Appetite is comes and goes.  Encouraged to eat food with protein and vegetables and maybe consider a drink like Ensure or boost to help keep up her energy level. Denies any pain.  Pt here for patient teaching.  Pt here for patient teaching.  Pt given Radiation and You booklet, skin care instructions, Alra deodorant and Radiaplex gel. Pt reports they have watched, not watched and refused to watch the Radiation Therapy Education video on September 01, 2015.  Reviewed areas of pertinence such as fatigue, hair loss, skin changes, breast tenderness and breast swelling . Pt able to give teach back of apply Radiaplex bid, avoid applying anything to skin within 4 hours of treatment, avoid wearing an under wire bra and to use an electric razor if they must shave. Pt demonstrated understanding of information given and will contact nursing with any questions or concerns.     Http://rtanswers.org/treatmentinformation/whattoexpect/index

## 2015-09-01 NOTE — Addendum Note (Signed)
Encounter addended by: Malena Edman, RN on: 09/01/2015  5:40 PM<BR>     Documentation filed: Inpatient Patient Education

## 2015-09-02 ENCOUNTER — Ambulatory Visit: Payer: 59

## 2015-09-02 ENCOUNTER — Ambulatory Visit
Admission: RE | Admit: 2015-09-02 | Discharge: 2015-09-02 | Disposition: A | Discharge status: Home / Self Care | Payer: 59 | Source: Ambulatory Visit | Attending: Radiation Oncology | Admitting: Radiation Oncology

## 2015-09-02 DIAGNOSIS — Z510 Encounter for antineoplastic radiation therapy: Secondary | ICD-10-CM | POA: Diagnosis not present

## 2015-09-03 ENCOUNTER — Ambulatory Visit
Admission: RE | Admit: 2015-09-03 | Discharge: 2015-09-03 | Disposition: A | Discharge status: Home / Self Care | Payer: 59 | Source: Ambulatory Visit | Attending: Radiation Oncology | Admitting: Radiation Oncology

## 2015-09-03 ENCOUNTER — Ambulatory Visit: Payer: 59

## 2015-09-03 DIAGNOSIS — Z510 Encounter for antineoplastic radiation therapy: Secondary | ICD-10-CM | POA: Diagnosis not present

## 2015-09-04 ENCOUNTER — Telehealth: Payer: Self-pay | Admitting: *Deleted

## 2015-09-04 ENCOUNTER — Ambulatory Visit
Admission: RE | Admit: 2015-09-04 | Discharge: 2015-09-04 | Disposition: A | Discharge status: Home / Self Care | Payer: 59 | Source: Ambulatory Visit | Attending: Radiation Oncology | Admitting: Radiation Oncology

## 2015-09-04 ENCOUNTER — Encounter: Payer: Self-pay | Admitting: Oncology

## 2015-09-04 ENCOUNTER — Ambulatory Visit: Payer: 59

## 2015-09-04 DIAGNOSIS — Z510 Encounter for antineoplastic radiation therapy: Secondary | ICD-10-CM | POA: Diagnosis not present

## 2015-09-04 NOTE — Telephone Encounter (Signed)
Spoke to pt concerning needs during xrt. Relate doing well and without complaints. Encourage pt to call with needs. Received verbal understanding.

## 2015-09-04 NOTE — Progress Notes (Signed)
Patient walked in to inquire about [employer]. Patient has completed chemotherapy and is now doing Radiation. Advised patient that Ailene Ravel and Jenny Reichmann are the advocates for Radiation. Patient will need to bring proof of income for herself and spouse when she returns to apply for the grant. Also gave patient a copy of the pharmacy card information from [facility] to use to help assist with oral meds. Patient has my name and number for any additional questions or concerns.

## 2015-09-07 ENCOUNTER — Ambulatory Visit
Admission: RE | Admit: 2015-09-07 | Discharge: 2015-09-07 | Disposition: A | Discharge status: Home / Self Care | Payer: 59 | Source: Ambulatory Visit | Attending: Radiation Oncology | Admitting: Radiation Oncology

## 2015-09-07 DIAGNOSIS — Z510 Encounter for antineoplastic radiation therapy: Secondary | ICD-10-CM | POA: Diagnosis not present

## 2015-09-08 ENCOUNTER — Encounter: Payer: Self-pay | Admitting: Radiation Oncology

## 2015-09-08 ENCOUNTER — Ambulatory Visit
Admission: RE | Admit: 2015-09-08 | Discharge: 2015-09-08 | Disposition: A | Discharge status: Home / Self Care | Payer: 59 | Source: Ambulatory Visit | Attending: Radiation Oncology | Admitting: Radiation Oncology

## 2015-09-08 VITALS — BP 132/92 | HR 82 | Temp 98.1°F | Resp 18 | Ht 65.0 in | Wt 236.2 lb

## 2015-09-08 DIAGNOSIS — Z510 Encounter for antineoplastic radiation therapy: Secondary | ICD-10-CM | POA: Diagnosis not present

## 2015-09-08 DIAGNOSIS — C50311 Malignant neoplasm of lower-inner quadrant of right female breast: Secondary | ICD-10-CM

## 2015-09-08 NOTE — Progress Notes (Signed)
Susan Wilson has received 6 fractions to her right breast.  Skin to right breast field has slight tanning under the breast fold, using Radiaplex gel.  Appetite is okay.  Drinking boost daily.  Fatigue usually starts after lunch taking iron pill.  Denies pain today. BP 132/92 mmHg  Pulse 82  Temp(Src) 98.1 F (36.7 C) (Oral)  Resp 18  Ht 5' 5"  (1.651 m)  Wt 236 lb 3.2 oz (107.14 kg)  BMI 39.31 kg/m2  SpO2 100%

## 2015-09-08 NOTE — Progress Notes (Signed)
Weekly Management Note Current Dose:  10.8 Gy  Projected Dose: 61 Gy   Narrative:  The patient presents for routine under treatment assessment.  CBCT/MVCT images/Port film x-rays were reviewed.  The chart was checked. Doing well. Some nerve pain in the right breast.   Physical Findings: Weight: 236 lb 3.2 oz (107.14 kg).  No skin changes.   Impression:  The patient is tolerating radiation.  Plan:  Continue treatment as planned. Continue radiaplex.

## 2015-09-09 ENCOUNTER — Ambulatory Visit
Admission: RE | Admit: 2015-09-09 | Discharge: 2015-09-09 | Disposition: A | Discharge status: Home / Self Care | Payer: 59 | Source: Ambulatory Visit | Attending: Radiation Oncology | Admitting: Radiation Oncology

## 2015-09-09 DIAGNOSIS — Z510 Encounter for antineoplastic radiation therapy: Secondary | ICD-10-CM | POA: Diagnosis not present

## 2015-09-10 ENCOUNTER — Ambulatory Visit
Admission: RE | Admit: 2015-09-10 | Discharge: 2015-09-10 | Disposition: A | Discharge status: Home / Self Care | Payer: 59 | Source: Ambulatory Visit | Attending: Radiation Oncology | Admitting: Radiation Oncology

## 2015-09-10 DIAGNOSIS — Z510 Encounter for antineoplastic radiation therapy: Secondary | ICD-10-CM | POA: Diagnosis not present

## 2015-09-11 ENCOUNTER — Ambulatory Visit
Admission: RE | Admit: 2015-09-11 | Discharge: 2015-09-11 | Disposition: A | Discharge status: Home / Self Care | Payer: 59 | Source: Ambulatory Visit | Attending: Radiation Oncology | Admitting: Radiation Oncology

## 2015-09-11 DIAGNOSIS — Z510 Encounter for antineoplastic radiation therapy: Secondary | ICD-10-CM | POA: Diagnosis not present

## 2015-09-14 ENCOUNTER — Ambulatory Visit
Admission: RE | Admit: 2015-09-14 | Discharge: 2015-09-14 | Disposition: A | Discharge status: Home / Self Care | Payer: 59 | Source: Ambulatory Visit | Attending: Radiation Oncology | Admitting: Radiation Oncology

## 2015-09-14 DIAGNOSIS — Z510 Encounter for antineoplastic radiation therapy: Secondary | ICD-10-CM | POA: Diagnosis not present

## 2015-09-15 ENCOUNTER — Ambulatory Visit
Admission: RE | Admit: 2015-09-15 | Discharge: 2015-09-15 | Disposition: A | Discharge status: Home / Self Care | Payer: 59 | Source: Ambulatory Visit | Attending: Radiation Oncology | Admitting: Radiation Oncology

## 2015-09-15 DIAGNOSIS — Z510 Encounter for antineoplastic radiation therapy: Secondary | ICD-10-CM | POA: Diagnosis not present

## 2015-09-15 DIAGNOSIS — C50311 Malignant neoplasm of lower-inner quadrant of right female breast: Secondary | ICD-10-CM

## 2015-09-15 NOTE — Progress Notes (Signed)
Weekly Management Note Current Dose:  19.8 Gy  Projected Dose: 61 Gy   Narrative:  The patient presents for routine under treatment assessment.  CBCT/MVCT images/Port film x-rays were reviewed.  The chart was checked.  Doing well. Reports itchiness when sweating that begins on her arms, lasting about 30 minutes. Notes this happened during chemotherapy, as well. Patient reports skin darkening.   Physical Findings: Weight:  .  Minimal skin darkening with no moist desquamation.  Impression:  The patient is tolerating radiation.  Plan:  Continue treatment as planned. Continue radiaplex. Patient is planning a trip to the beach, I have recommended she wear a sun shirt to avoid irritation from sun exposure.  ------------------------------------------------  Thea Silversmith, MD  This document serves as a record of services personally performed by Thea Silversmith, MD. It was created on her behalf by Arlyce Harman, a trained medical scribe. The creation of this record is based on the scribe's personal observations and the provider's statements to them. This document has been checked and approved by the attending provider.

## 2015-09-16 ENCOUNTER — Ambulatory Visit (HOSPITAL_BASED_OUTPATIENT_CLINIC_OR_DEPARTMENT_OTHER): Payer: 59 | Admitting: Oncology

## 2015-09-16 ENCOUNTER — Telehealth: Payer: Self-pay | Admitting: Oncology

## 2015-09-16 ENCOUNTER — Ambulatory Visit
Admission: RE | Admit: 2015-09-16 | Discharge: 2015-09-16 | Disposition: A | Discharge status: Home / Self Care | Payer: 59 | Source: Ambulatory Visit | Attending: Radiation Oncology | Admitting: Radiation Oncology

## 2015-09-16 ENCOUNTER — Other Ambulatory Visit (HOSPITAL_BASED_OUTPATIENT_CLINIC_OR_DEPARTMENT_OTHER): Payer: 59

## 2015-09-16 VITALS — BP 145/93 | HR 74 | Temp 98.2°F | Resp 18 | Ht 65.0 in | Wt 237.0 lb

## 2015-09-16 DIAGNOSIS — C50311 Malignant neoplasm of lower-inner quadrant of right female breast: Secondary | ICD-10-CM

## 2015-09-16 DIAGNOSIS — Z510 Encounter for antineoplastic radiation therapy: Secondary | ICD-10-CM | POA: Diagnosis not present

## 2015-09-16 DIAGNOSIS — Z171 Estrogen receptor negative status [ER-]: Secondary | ICD-10-CM

## 2015-09-16 LAB — CBC WITH DIFFERENTIAL/PLATELET
BASO%: 0.5 % (ref 0.0–2.0)
BASOS ABS: 0 10*3/uL (ref 0.0–0.1)
EOS%: 1.2 % (ref 0.0–7.0)
Eosinophils Absolute: 0 10*3/uL (ref 0.0–0.5)
HEMATOCRIT: 38.3 % (ref 34.8–46.6)
HGB: 11.6 g/dL (ref 11.6–15.9)
LYMPH#: 1 10*3/uL (ref 0.9–3.3)
LYMPH%: 30.6 % (ref 14.0–49.7)
MCH: 23 pg — AB (ref 25.1–34.0)
MCHC: 30.3 g/dL — AB (ref 31.5–36.0)
MCV: 76 fL — ABNORMAL LOW (ref 79.5–101.0)
MONO#: 0.4 10*3/uL (ref 0.1–0.9)
MONO%: 12 % (ref 0.0–14.0)
NEUT#: 1.8 10*3/uL (ref 1.5–6.5)
NEUT%: 55.7 % (ref 38.4–76.8)
PLATELETS: 220 10*3/uL (ref 145–400)
RBC: 5.03 10*6/uL (ref 3.70–5.45)
RDW: 16.2 % — ABNORMAL HIGH (ref 11.2–14.5)
WBC: 3.3 10*3/uL — ABNORMAL LOW (ref 3.9–10.3)

## 2015-09-16 LAB — COMPREHENSIVE METABOLIC PANEL
ALT: 23 U/L (ref 0–55)
ANION GAP: 7 meq/L (ref 3–11)
AST: 16 U/L (ref 5–34)
Albumin: 3.9 g/dL (ref 3.5–5.0)
Alkaline Phosphatase: 73 U/L (ref 40–150)
BUN: 10.3 mg/dL (ref 7.0–26.0)
CALCIUM: 9.1 mg/dL (ref 8.4–10.4)
CHLORIDE: 105 meq/L (ref 98–109)
CO2: 27 mEq/L (ref 22–29)
CREATININE: 0.8 mg/dL (ref 0.6–1.1)
EGFR: >90 mL/min/{1.73_m2} (ref >90)
Glucose: 100 mg/dl (ref 70–140)
POTASSIUM: 3.8 meq/L (ref 3.5–5.1)
Sodium: 138 mEq/L (ref 136–145)
Total Bilirubin: 0.6 mg/dL (ref 0.20–1.20)
Total Protein: 7.3 g/dL (ref 6.4–8.3)

## 2015-09-16 MED ORDER — SUMATRIPTAN SUCCINATE 50 MG PO TABS: 50.0000 mg | ORAL_TABLET | ORAL | Status: DC | PRN

## 2015-09-16 NOTE — Progress Notes (Signed)
[place]  [phone] [phone]) [phone]     ID: Susan Wilson DOB: 03-27-1976  MR#: [id number]  JYN#:[id number]  Patient Care Team: Donnamae Jude, MD as PCP - General (Obstetrics and Gynecology) Rolm Bookbinder, MD as Consulting Physician (General Surgery) Chauncey Cruel, MD as Consulting Physician (Oncology) Thea Silversmith, MD as Consulting Physician (Radiation Oncology) [name], RN as Registered Nurse Rockwell Germany, RN as Registered Nurse Holley Bouche, NP as Nurse Practitioner (Nurse Practitioner) PCP: Donnamae Jude, MD OTHER MD:  CHIEF COMPLAINT: Estrogen receptor negative breast cancer  CURRENT TREATMENT: Adjuvant radiation  BREAST CANCER HISTORY: From the original intake note:  Susan Wilson herself noted a mass in her right breast sometime around April. Initially she thought it might be related to menstruation, but as it did not change and eventually became tender, she brought it to her physician's attention. On 11/20/2014 patient underwent bilateral diagnostic mammography with tomosynthesis and right breast ultrasonography at the breast Center. The breast density was category B. There was a hyperdense mass in the right lower inner quadrant associated with skin thickening. There was also a 5 mm nodule posteriorly at the 8:30 o'clock position in the right breast. There were several hyperdense nodules in the right axilla. On physical exam there was a firm fixed mass in the right breast at the 5:00 position. By ultrasound this was lobulated and appear to involve the skin. It measures up to 4.1 cm. There was no sonographic correlation to the 5 mm nodule seen in a different area of the right breast. The right axilla showed 3 hypervascular lymph nodes with prominent cortical thickening, measuring less than 1.5 cm.  On 11/21/2014 the patient underwent right breast biopsy (5:00 mass) and biopsy of one of the suspicious right axillary  lymph nodes. 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  The patient's subsequent history is as detailed below  INTERVAL HISTORY:  Susan Wilson returns today for follow up of her triple negative breast cancer. She is in the middle of her radiation treatments. She is generally tolerating that well. She says her energy is "getting better". She has had some "tanning" of the skin, but no peeling. He has had no problems with nausea or pain.   REVIEW OF SYSTEMS: Susan Wilson's periods stopped with chemotherapy and have not resumed, although she had minimal "spotting" one day this past month. She is not having significant hot flashes or other obvious Postmenopausal symptoms. She has a history of migraines, and there are little bit more, now. I'm going to start her in Imitrex. We did discuss that today. She has mild sinus problems, occasionally her ankles swell, she has rare nausea or vomiting issues, she bruises easily, and has chronic low back pain which is not more persistent or intense than prior. A detailed review of systems today was otherwise stable  PAST MEDICAL HISTORY: Past Medical History  Diagnosis Date  . Breast cancer of lower-inner quadrant of right female breast ([place]) 11/25/2014  . Breast cancer ([place])   . Hernia, umbilical   . Back pain   . Headache   . Anemia   . Hernia, umbilical   . Family history of breast cancer   . 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 Portacath placement Right 12/16/2014    Procedure: INSERTION PORT-A-CATH WITH ULTRASOUND;  Surgeon: Rolm Bookbinder, MD;  Location: [place];  Service: General;  Laterality: Right;  . Radioactive seed guided mastectomy with  axillary sentinel lymph node biopsy Right 06/15/2015    Procedure: RADIOACTIVE SEED GUIDED PARTIAL MASTECTOMY WITH AXILLARY SENTINEL LYMPH NODE BIOPSY;  Surgeon: Rolm Bookbinder, MD;  Location: [place];  Service: General;  Laterality: Right;  . Breast reconstruction Right 06/23/2015    Procedure: ONCOPLASTIC RIGHT BREAST REDUCTION;  Surgeon: Irene Limbo, MD;  Location: [place];  Service: Plastics;  Laterality: Right;  . Breast reduction surgery Left 06/23/2015    Procedure: MAMMARY REDUCTION  (BREAST) LEFT BREAST FOR SYMETRY (BILATERAL BREAST REDUCTION);  Surgeon: Irene Limbo, MD;  Location: [place];  Service: Plastics;  Laterality: Left;    FAMILY HISTORY Family History  Problem Relation Age of Onset  . Mesothelioma Maternal Grandfather     asbestos exposure, died in his 30s  . Heart defect Sister 0  . Heart disease Paternal Aunt   . Heart disease Paternal Uncle   . Breast cancer Paternal Grandmother   . Diabetes Paternal Grandmother   . Cancer Paternal Grandfather     NOS  . Colon cancer Other 31    MGMs brother with colon cancer  . Cancer Other     several of MGF's sisters with cancer NOS  . Liver cancer Other     MGF's brother   the patient's parents are living, her father being 54 and her mother 37 as of July 2016. The patient had no brothers. One sister died at age 46 from cardiac problems. The other sister is in good health. On the maternal side there is a history of colon cancer and an uncle age 87, liver cancer in a great uncle and mesothelioma in the maternal grandfather.  GYNECOLOGIC HISTORY:  No LMP recorded. Patient is not currently having periods (Reason: Chemotherapy). Menarche age 48. The patient is GX P2. 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 The daughters are [place] and Lovie Macadamia, age 58 and 78. The  patient attends a local [employer]: Not in place   HEALTH MAINTENANCE: Social History  Substance Use Topics  . Smoking status: Never Smoker   . Smokeless tobacco: Never Used  . Alcohol Use: Yes     Comment: rarely     Colonoscopy:  PAP: 2014  Bone density:  Lipid panel:  No Known Allergies  Current Outpatient Prescriptions  Medication Sig Dispense Refill  . hyaluronate sodium (RADIAPLEXRX) GEL Apply 1 application topically once.    Marland Kitchen ibuprofen (ADVIL,MOTRIN) 200 MG tablet Take 400 mg by mouth every 6 (six) hours as needed (for pain). Reported on 05/14/2015    . LORazepam (ATIVAN) 0.5 MG tablet Take 1 tablet (0.5 mg total) by mouth at bedtime. 30 tablet 0  . non-metallic deodorant (ALRA) MISC Apply 1 application topically daily as needed.    Marland Kitchen oxyCODONE-acetaminophen (PERCOCET) 10-325 MG tablet Take 1 tablet by mouth every 4 (four) hours as needed for pain. 50 tablet 0   No current facility-administered medications for this visit.    OBJECTIVE: Young African-American woman Who appears well  Filed Vitals:   09/16/15 1059  BP: 145/93  Pulse: 74  Temp: 98.2 F (36.8 C)  Resp:  18     Body mass index is 39.44 kg/(m^2).    ECOG FS:0 - Asymptomatic  Sclerae unicteric, pupils round and equal Oropharynx clear and moist-- no thrush or other lesions No cervical or supraclavicular adenopathy Lungs no rales or rhonchi Heart regular rate and rhythm Abd soft, obese, nontender, positive bowel sounds MSK no focal spinal tenderness, no upper extremity lymphedema Neuro: nonfocal, well oriented, appropriate affect Breasts: Both breasts are status post reduction mammoplasty. The right breast is status post lumpectomy and is receiving radiation. There is minimal erythema and no desquamation. The cosmetic result is good. The right axilla is benign. The left breast is unremarkable.    LAB RESULTS:  CMP  CBC Latest Ref Rng 09/16/2015  07/27/2015 07/09/2015  WBC 3.9 - 10.3 10e3/uL 3.3(L) 5.2 3.9  Hemoglobin 11.6 - 15.9 g/dL 11.6 10.5(L) 9.6(L)  Hematocrit 34.8 - 46.6 % 38.3 34.7(L) 30.9(L)  Platelets 145 - 400 10e3/uL 220 260 318   CMP Latest Ref Rng 07/27/2015 07/09/2015 05/14/2015  Glucose 70 - 140 mg/dl 93 115 124  BUN 7.0 - 26.0 mg/dL 13.7 13.2 12.1  Creatinine 0.6 - 1.1 mg/dL 0.8 0.8 0.8  Sodium 136 - 145 mEq/L 138 136 138  Potassium 3.5 - 5.1 mEq/L 3.9 3.7 4.1  CO2 22 - 29 mEq/L 27 25 26   Calcium 8.4 - 10.4 mg/dL 9.0 9.2 9.0  Total Protein 6.4 - 8.3 g/dL 7.1 7.2 7.1  Total Bilirubin 0.20 - 1.20 mg/dL 0.67 0.58 0.60  Alkaline Phos 40 - 150 U/L 70 65 64  AST 5 - 34 U/L 17 19 18   ALT 0 - 55 U/L 22 27 40      Urinalysis    Component Value Date/Time   LABSPEC 1.020 05/06/2009 1439   PHURINE 7.0 05/06/2009 1439   GLUCOSEU NEGATIVE 05/06/2009 1439   HGBUR NEGATIVE 05/06/2009 1439   BILIRUBINUR NEGATIVE 05/06/2009 1439   KETONESUR NEGATIVE 05/06/2009 1439   PROTEINUR NEGATIVE 05/06/2009 1439   UROBILINOGEN 0.2 05/06/2009 1439   NITRITE NEGATIVE 05/06/2009 1439   LEUKOCYTESUR  05/06/2009 1439    NEGATIVE Biochemical Testing Only. Please order routine urinalysis from main lab if confirmatory testing is needed.    STUDIES: No results found.  ASSESSMENT: 40 y.o. BRCA negative [place] woman s/p Right breast biopsy 11/21/2014 for a clinical T2 NX, stage 2 invasive ductal carcinoma, grade 2 or 3, estrogen and progesterone receptor negative, HER-2 equivocal, with an Mib-1 of 20%  (a) biopsy of a suspicious axillary lymph node same day was negative, but discordant  (b) review of 80 additional tumor cells by FISH still showed HER-2 not amplified; tumor should be treated as a triple negative  (1) neoadjuvant chemotherapy started 01/01/2015 consisting of cyclophosphamide and doxorubicin in dose dense fashion 4, with onpro support, completed 02/13/2015, followed by paclitaxel/ carboplatin weekly 12 started  02/26/2015   (2) genetics testing 12/15/2014 through the Breast/Ovarian gene panel offered by [facility] found no deleterious mutations in ATM, BARD1, BRCA1, BRCA2, BRIP1, CDH1, CHEK2, EPCAM, FANCC, MLH1, MSH2, MSH6, NBN, PALB2, PMS2, PTEN, RAD51C, RAD51D, TP53, and XRCC2  (3) right lumpectomy and sentinel lymph node sampling 06/15/2015 showed a residual pT1b pN0 invasive ductal carcinoma, grade 1, with negative margins. (There were actually a few microscopic nests of residual tumor and 0.9 is the longest distance between 2 of the microscopic nests--there was not enough tissue for repeat prognostic panel.)  (a) bilateral reduction mammoplasty 06/23/2015 showed no malignancy in either breast  (4) radiation to be completed  mid-June 2017  (5) the patient's menses stopped during chemotherapy and have not resumed In the left has been irritated  PLAN: Susan Wilson is tolerating radiation well and will be done with those treatments sometime in June. After that she can have her port removed.  I'm going to see her in July. At that time we will start long-term follow-up.  If her periods resume we will have to discuss whether we want to proceed to bilateral salpingo-oophorectomy. The benefits in the key study were small and largely confined to women under 16, but she is right on the borderline, and I would strongly consider bilateral salpingo-oophorectomy in her case. Of course it appears do not resume we do not need to consider  I suggested that she sign up for the July [facility] program.  She knows to call for any problems that may develop before her next visit here.  Chauncey Cruel, MD   09/16/2015 11:06 AM

## 2015-09-16 NOTE — Telephone Encounter (Signed)
appt made and avs printed °

## 2015-09-17 ENCOUNTER — Ambulatory Visit
Admission: RE | Admit: 2015-09-17 | Discharge: 2015-09-17 | Disposition: A | Discharge status: Home / Self Care | Payer: 59 | Source: Ambulatory Visit | Attending: Radiation Oncology | Admitting: Radiation Oncology

## 2015-09-17 ENCOUNTER — Encounter: Payer: Self-pay | Admitting: Radiation Oncology

## 2015-09-17 DIAGNOSIS — Z510 Encounter for antineoplastic radiation therapy: Secondary | ICD-10-CM | POA: Diagnosis not present

## 2015-09-18 ENCOUNTER — Ambulatory Visit
Admission: RE | Admit: 2015-09-18 | Discharge: 2015-09-18 | Disposition: A | Discharge status: Home / Self Care | Payer: 59 | Source: Ambulatory Visit | Attending: Radiation Oncology | Admitting: Radiation Oncology

## 2015-09-18 DIAGNOSIS — Z510 Encounter for antineoplastic radiation therapy: Secondary | ICD-10-CM | POA: Diagnosis not present

## 2015-09-21 ENCOUNTER — Ambulatory Visit
Admission: RE | Admit: 2015-09-21 | Discharge: 2015-09-21 | Disposition: A | Discharge status: Home / Self Care | Payer: 59 | Source: Ambulatory Visit | Attending: Radiation Oncology | Admitting: Radiation Oncology

## 2015-09-21 DIAGNOSIS — Z510 Encounter for antineoplastic radiation therapy: Secondary | ICD-10-CM | POA: Diagnosis not present

## 2015-09-22 ENCOUNTER — Ambulatory Visit
Admission: RE | Admit: 2015-09-22 | Discharge: 2015-09-22 | Disposition: A | Discharge status: Home / Self Care | Payer: 59 | Source: Ambulatory Visit | Attending: Radiation Oncology | Admitting: Radiation Oncology

## 2015-09-22 ENCOUNTER — Encounter: Payer: Self-pay | Admitting: Radiation Oncology

## 2015-09-22 VITALS — BP 131/92 | HR 86 | Temp 98.0°F | Resp 18 | Ht 65.0 in | Wt 237.5 lb

## 2015-09-22 DIAGNOSIS — Z510 Encounter for antineoplastic radiation therapy: Secondary | ICD-10-CM | POA: Diagnosis not present

## 2015-09-22 DIAGNOSIS — C50311 Malignant neoplasm of lower-inner quadrant of right female breast: Secondary | ICD-10-CM

## 2015-09-22 NOTE — Progress Notes (Signed)
Weekly Management Note Current Dose:  28.8 Gy  Projected Dose:  61 Gy   Narrative:  The patient presents for routine under treatment assessment.  CBCT/MVCT images/Port film x-rays were reviewed.  The chart was checked.  Mrs. Even-Wilson has received 16 fractions to her right breast. Right breast field with tanning has tenderness and swelling. Using Radiaplex gel twice daily. Appetite comes and goes drinking boost daily. Denies pain today. She mentions that she has itching in the treatment area.   Physical Findings: Weight: 237 lb 8 oz (107.729 kg). Unchanged. She has very slight pink under her right breast.  Impression:  The patient is tolerating radiation.  Plan:  Continue treatment as planned. Continues using Radiaplex.    ------------------------------------------------  Thea Silversmith, MD    This document serves as a record of services personally performed by Thea Silversmith, MD. It was created on her behalf by  Lendon Collar, a trained medical scribe. The creation of this record is based on the scribe's personal observations and the provider's statements to them. This document has been checked and approved by the attending provider.

## 2015-09-22 NOTE — Progress Notes (Signed)
Mrs. Cortopassi has received 16 fractions to her right breast.  Right breast field with tanning has tenderness and swelling.  Using Radiaplex gel bid.  Appetite comes and goes drinking boost daily.  Denies pain today. BP 131/92 mmHg  Pulse 86  Temp(Src) 98 F (36.7 C) (Oral)  Resp 18  Ht 5' 5"  (1.651 m)  Wt 237 lb 8 oz (107.729 kg)  BMI 39.52 kg/m2  SpO2 100%

## 2015-09-23 ENCOUNTER — Ambulatory Visit
Admission: RE | Admit: 2015-09-23 | Discharge: 2015-09-23 | Disposition: A | Discharge status: Home / Self Care | Payer: 59 | Source: Ambulatory Visit | Attending: Radiation Oncology | Admitting: Radiation Oncology

## 2015-09-23 DIAGNOSIS — Z510 Encounter for antineoplastic radiation therapy: Secondary | ICD-10-CM | POA: Diagnosis not present

## 2015-09-24 ENCOUNTER — Ambulatory Visit
Admission: RE | Admit: 2015-09-24 | Discharge: 2015-09-24 | Disposition: A | Discharge status: Home / Self Care | Payer: 59 | Source: Ambulatory Visit | Attending: Radiation Oncology | Admitting: Radiation Oncology

## 2015-09-24 DIAGNOSIS — Z510 Encounter for antineoplastic radiation therapy: Secondary | ICD-10-CM | POA: Diagnosis not present

## 2015-09-25 ENCOUNTER — Ambulatory Visit
Admission: RE | Admit: 2015-09-25 | Discharge: 2015-09-25 | Disposition: A | Discharge status: Home / Self Care | Payer: 59 | Source: Ambulatory Visit | Attending: Radiation Oncology | Admitting: Radiation Oncology

## 2015-09-25 DIAGNOSIS — Z510 Encounter for antineoplastic radiation therapy: Secondary | ICD-10-CM | POA: Diagnosis not present

## 2015-09-28 ENCOUNTER — Ambulatory Visit
Admission: RE | Admit: 2015-09-28 | Discharge: 2015-09-28 | Disposition: A | Discharge status: Home / Self Care | Payer: 59 | Source: Ambulatory Visit | Attending: Radiation Oncology | Admitting: Radiation Oncology

## 2015-09-28 DIAGNOSIS — Z510 Encounter for antineoplastic radiation therapy: Secondary | ICD-10-CM | POA: Diagnosis not present

## 2015-09-29 ENCOUNTER — Ambulatory Visit
Admission: RE | Admit: 2015-09-29 | Discharge: 2015-09-29 | Disposition: A | Discharge status: Home / Self Care | Payer: 59 | Source: Ambulatory Visit | Attending: Radiation Oncology | Admitting: Radiation Oncology

## 2015-09-29 ENCOUNTER — Encounter: Payer: Self-pay | Admitting: Radiation Oncology

## 2015-09-29 VITALS — BP 129/96 | HR 84 | Temp 98.6°F | Resp 18 | Ht 65.0 in | Wt 235.9 lb

## 2015-09-29 DIAGNOSIS — Z510 Encounter for antineoplastic radiation therapy: Secondary | ICD-10-CM | POA: Diagnosis not present

## 2015-09-29 DIAGNOSIS — C50311 Malignant neoplasm of lower-inner quadrant of right female breast: Secondary | ICD-10-CM

## 2015-09-29 NOTE — Progress Notes (Signed)
Weekly Management Note Current Dose:  37.8 Gy  Projected Dose:  61 Gy   Narrative:  The patient presents for routine under treatment assessment.  CBCT/MVCT images/Port film x-rays were reviewed.  The chart was checked.  Susan Wilson has received 16 fractions to her right breast.  Right breast field with tanning has tenderness and swelling.  Using Radiaplex gel bid.  Appetite comes and goes drinking boost daily.  Denies pain today.  Physical Findings: Weight: 235 lb 14.4 oz (107.004 kg). Unchanged. She has very slight pink under her right breast.  Filed Vitals:   09/29/15 1021  BP: 129/96  Pulse: 84  Temp: 98.6 F (37 C)  Resp: 18   Impression:  The patient is tolerating radiation.  Plan:  Continue treatment as planned. Continues using Radiaplex.   ------------------------------------------------  Thea Silversmith, MD   This document serves as a record of services personally performed by Thea Silversmith, MD. It was created on her behalf by Derek Mound, a trained medical scribe. The creation of this record is based on the scribe's personal observations and the provider's statements to them. This document has been checked and approved by the attending provider.

## 2015-09-29 NOTE — Progress Notes (Signed)
Susan Wilson has received 21 fractions to her right breast. Skin to right breast with dark hyperpigmentation and the axilla there is a small open area noticed yesterday.  Using Radiaplex gel bid.  Appetite is good eats small meals during the day.  Having fatigue during the day.  Pain 5/10 to right breast taking Ibuprofen. BP 129/96 mmHg  Pulse 84  Temp(Src) 98.6 F (37 C) (Oral)  Resp 18  Ht 5' 5"  (1.651 m)  Wt 235 lb 14.4 oz (107.004 kg)  BMI 39.26 kg/m2  SpO2 100%

## 2015-09-30 ENCOUNTER — Other Ambulatory Visit: Payer: Self-pay | Admitting: *Deleted

## 2015-09-30 ENCOUNTER — Ambulatory Visit: Payer: 59

## 2015-09-30 ENCOUNTER — Ambulatory Visit
Admission: RE | Admit: 2015-09-30 | Discharge: 2015-09-30 | Disposition: A | Discharge status: Home / Self Care | Payer: 59 | Source: Ambulatory Visit | Attending: Radiation Oncology | Admitting: Radiation Oncology

## 2015-09-30 DIAGNOSIS — Z510 Encounter for antineoplastic radiation therapy: Secondary | ICD-10-CM | POA: Diagnosis not present

## 2015-09-30 MED ORDER — PROCHLORPERAZINE MALEATE 10 MG PO TABS: 10.0000 mg | ORAL_TABLET | Freq: Four times a day (QID) | ORAL | Status: DC | PRN

## 2015-10-01 ENCOUNTER — Ambulatory Visit: Payer: 59

## 2015-10-01 ENCOUNTER — Ambulatory Visit
Admission: RE | Admit: 2015-10-01 | Discharge: 2015-10-01 | Disposition: A | Discharge status: Home / Self Care | Payer: 59 | Source: Ambulatory Visit | Attending: Radiation Oncology | Admitting: Radiation Oncology

## 2015-10-01 DIAGNOSIS — Z510 Encounter for antineoplastic radiation therapy: Secondary | ICD-10-CM | POA: Diagnosis not present

## 2015-10-02 ENCOUNTER — Ambulatory Visit: Payer: 59

## 2015-10-02 ENCOUNTER — Ambulatory Visit
Admission: RE | Admit: 2015-10-02 | Discharge: 2015-10-02 | Disposition: A | Discharge status: Home / Self Care | Payer: 59 | Source: Ambulatory Visit | Attending: Radiation Oncology | Admitting: Radiation Oncology

## 2015-10-02 DIAGNOSIS — Z510 Encounter for antineoplastic radiation therapy: Secondary | ICD-10-CM | POA: Diagnosis not present

## 2015-10-03 ENCOUNTER — Other Ambulatory Visit: Payer: Self-pay | Admitting: Oncology

## 2015-10-05 ENCOUNTER — Ambulatory Visit
Admission: RE | Admit: 2015-10-05 | Discharge: 2015-10-05 | Disposition: A | Discharge status: Home / Self Care | Payer: 59 | Source: Ambulatory Visit | Attending: Radiation Oncology | Admitting: Radiation Oncology

## 2015-10-05 ENCOUNTER — Other Ambulatory Visit: Payer: Self-pay | Admitting: *Deleted

## 2015-10-05 DIAGNOSIS — C50311 Malignant neoplasm of lower-inner quadrant of right female breast: Secondary | ICD-10-CM

## 2015-10-05 DIAGNOSIS — Z510 Encounter for antineoplastic radiation therapy: Secondary | ICD-10-CM | POA: Diagnosis not present

## 2015-10-05 MED ORDER — SUMATRIPTAN SUCCINATE 50 MG PO TABS: 50.0000 mg | ORAL_TABLET | ORAL | Status: DC | PRN

## 2015-10-06 ENCOUNTER — Ambulatory Visit
Admission: RE | Admit: 2015-10-06 | Discharge: 2015-10-06 | Disposition: A | Discharge status: Home / Self Care | Payer: 59 | Source: Ambulatory Visit | Attending: Radiation Oncology | Admitting: Radiation Oncology

## 2015-10-06 ENCOUNTER — Ambulatory Visit: Payer: 59

## 2015-10-06 ENCOUNTER — Encounter: Payer: Self-pay | Admitting: Radiation Oncology

## 2015-10-06 ENCOUNTER — Telehealth: Payer: Self-pay | Admitting: Oncology

## 2015-10-06 VITALS — BP 131/97 | HR 85 | Temp 98.0°F | Resp 18 | Ht 65.0 in | Wt 236.7 lb

## 2015-10-06 DIAGNOSIS — Z510 Encounter for antineoplastic radiation therapy: Secondary | ICD-10-CM | POA: Diagnosis not present

## 2015-10-06 DIAGNOSIS — C50311 Malignant neoplasm of lower-inner quadrant of right female breast: Secondary | ICD-10-CM

## 2015-10-06 NOTE — Progress Notes (Signed)
Weekly Management Note Current Dose:  47 Gy  Projected Dose:  61 Gy   Narrative:  The patient presents for routine under treatment assessment.  CBCT/MVCT images/Port film x-rays were reviewed.  The chart was checked.  Mrs. Lefevers-Wilson has received 26 fractions to her right breast. Right breast with dark hyperpigmentation and axilla with peeling area x 2, using Neosporin ontiment started last week. Appetite eating small meal during the day and drinking boost daily. Having fatigue during the day. Pain 5/10 to right breast taking Ibuprofen.  Physical Findings: Weight: 236 lb 11.2 oz (107.366 kg). Unchanged. She has very slight pink under her right breast. She has some hyperpigmentation on her back.  Filed Vitals:   10/06/15 1028  BP: 131/97  Pulse: 85  Temp: 98 F (36.7 C)  Resp: 18   Impression:  The patient is tolerating radiation.  Plan:  Continue treatment as planned. Continue using Radiaplex.   ------------------------------------------------  Thea Silversmith, MD    This document serves as a record of services personally performed by Thea Silversmith, MD. It was created on her behalf by  Lendon Collar, a trained medical scribe. The creation of this record is based on the scribe's personal observations and the provider's statements to them. This document has been checked and approved by the attending provider.

## 2015-10-06 NOTE — Progress Notes (Signed)
Susan Wilson has received 26 fractions to her right breast.  Right breast with dark hyperpigmentation and axilla with peeling area x 2, using Neosporin ontiment started last week.  Appetite eating small meal during the day and drinking boost daily.  Having fatigue during the day.  Pain 5/10 to right breast taking Ibuprofen. BP 131/97 mmHg  Pulse 85  Temp(Src) 98 F (36.7 C)  Resp 18  Ht 5' 5"  (1.651 m)  Wt 236 lb 11.2 oz (107.366 kg)  BMI 39.39 kg/m2  SpO2 100%

## 2015-10-06 NOTE — Telephone Encounter (Signed)
Faxed last  Office note to [facility] [phone]

## 2015-10-07 ENCOUNTER — Ambulatory Visit
Admission: RE | Admit: 2015-10-07 | Discharge: 2015-10-07 | Disposition: A | Discharge status: Home / Self Care | Payer: 59 | Source: Ambulatory Visit | Attending: Radiation Oncology | Admitting: Radiation Oncology

## 2015-10-07 ENCOUNTER — Ambulatory Visit: Payer: 59

## 2015-10-07 DIAGNOSIS — C50311 Malignant neoplasm of lower-inner quadrant of right female breast: Secondary | ICD-10-CM | POA: Diagnosis present

## 2015-10-07 DIAGNOSIS — Z510 Encounter for antineoplastic radiation therapy: Secondary | ICD-10-CM | POA: Insufficient documentation

## 2015-10-08 ENCOUNTER — Ambulatory Visit: Payer: 59

## 2015-10-08 ENCOUNTER — Ambulatory Visit
Admission: RE | Admit: 2015-10-08 | Discharge: 2015-10-08 | Disposition: A | Discharge status: Home / Self Care | Payer: 59 | Source: Ambulatory Visit | Attending: Radiation Oncology | Admitting: Radiation Oncology

## 2015-10-08 DIAGNOSIS — Z510 Encounter for antineoplastic radiation therapy: Secondary | ICD-10-CM | POA: Diagnosis not present

## 2015-10-09 ENCOUNTER — Ambulatory Visit
Admission: RE | Admit: 2015-10-09 | Discharge: 2015-10-09 | Disposition: A | Discharge status: Home / Self Care | Payer: 59 | Source: Ambulatory Visit | Attending: Radiation Oncology | Admitting: Radiation Oncology

## 2015-10-09 ENCOUNTER — Ambulatory Visit: Payer: 59

## 2015-10-12 ENCOUNTER — Ambulatory Visit: Payer: 59

## 2015-10-13 ENCOUNTER — Ambulatory Visit: Payer: 59

## 2015-10-13 ENCOUNTER — Encounter: Payer: Self-pay | Admitting: Radiation Oncology

## 2015-10-13 ENCOUNTER — Ambulatory Visit
Admission: RE | Admit: 2015-10-13 | Discharge: 2015-10-13 | Disposition: A | Discharge status: Home / Self Care | Payer: 59 | Source: Ambulatory Visit | Attending: Radiation Oncology | Admitting: Radiation Oncology

## 2015-10-13 VITALS — BP 143/98 | HR 79 | Temp 98.1°F | Resp 18 | Ht 65.0 in | Wt 234.9 lb

## 2015-10-13 DIAGNOSIS — Z510 Encounter for antineoplastic radiation therapy: Secondary | ICD-10-CM | POA: Diagnosis not present

## 2015-10-13 DIAGNOSIS — C50311 Malignant neoplasm of lower-inner quadrant of right female breast: Secondary | ICD-10-CM

## 2015-10-13 NOTE — Progress Notes (Signed)
Susan Wilson has received 29 fractions to her right breast.  Has dark hyperpigmentation to her right and peeling under the axilla. Using Radiaplex gel bid.  Appetite comes and goes continues t eat small meals during the day and drink a boost.  Having fatigue during the day.  Pain 3/10 taking Ibuprofen.  EOT education done done. BP 143/98 mmHg  Pulse 79  Temp(Src) 98.1 F (36.7 C) (Oral)  Resp 18  Ht 5' 5"  (1.651 m)  Wt 234 lb 14.4 oz (106.55 kg)  BMI 39.09 kg/m2  SpO2 100%

## 2015-10-13 NOTE — Progress Notes (Signed)
Weekly Management Note Current Dose:  53 Gy  Projected Dose:  61 Gy   Narrative:  The patient presents for routine under treatment assessment.  CBCT/MVCT images/Port film x-rays were reviewed.  The chart was checked.  Susan Wilson has received 29 fractions to her right breast. Has dark hyperpigmentation to her right and peeling under the axilla. Using Radiaplex gel bid. Appetite comes and goes and she continues to eat small meals during the day and drink a boost. Having fatigue during the day. Pain 3/10 taking Ibuprofen. EOT education done done.  Physical Findings: Weight: 234 lb 14.4 oz (106.55 kg). Dark right breast with dry desquamation in the right axilla.  Filed Vitals:   10/13/15 1015  BP: 143/98  Pulse: 79  Temp: 98.1 F (36.7 C)  Resp: 18   Impression:  The patient is tolerating radiation.  Plan:  Continue treatment as planned. Continue using Radiaplex. Discussed post RT skin care.    ------------------------------------------------  Thea Silversmith, MD  This document serves as a record of services personally performed by Thea Silversmith, MD. It was created on her behalf by Darcus Austin, a trained medical scribe. The creation of this record is based on the scribe's personal observations and the provider's statements to them. This document has been checked and approved by the attending provider.

## 2015-10-14 ENCOUNTER — Ambulatory Visit: Payer: 59

## 2015-10-14 ENCOUNTER — Ambulatory Visit
Admission: RE | Admit: 2015-10-14 | Discharge: 2015-10-14 | Disposition: A | Discharge status: Home / Self Care | Payer: 59 | Source: Ambulatory Visit | Attending: Radiation Oncology | Admitting: Radiation Oncology

## 2015-10-14 DIAGNOSIS — Z510 Encounter for antineoplastic radiation therapy: Secondary | ICD-10-CM | POA: Diagnosis not present

## 2015-10-15 ENCOUNTER — Ambulatory Visit: Payer: 59

## 2015-10-15 ENCOUNTER — Encounter: Payer: Self-pay | Admitting: Oncology

## 2015-10-15 ENCOUNTER — Telehealth: Payer: Self-pay | Admitting: *Deleted

## 2015-10-15 ENCOUNTER — Ambulatory Visit
Admission: RE | Admit: 2015-10-15 | Discharge: 2015-10-15 | Disposition: A | Discharge status: Home / Self Care | Payer: 59 | Source: Ambulatory Visit | Attending: Radiation Oncology | Admitting: Radiation Oncology

## 2015-10-15 DIAGNOSIS — Z510 Encounter for antineoplastic radiation therapy: Secondary | ICD-10-CM | POA: Diagnosis not present

## 2015-10-15 NOTE — Progress Notes (Signed)
Left in pod==cancer forms

## 2015-10-15 NOTE — Telephone Encounter (Signed)
  Oncology Nurse Navigator Documentation    Navigator Encounter Type: Telephone (10/15/15 1400) Telephone: Lahoma Crocker Call (Disability and grant letters) (10/15/15 1400)  Informed pt that grant letters and forms are ready to be picked up. Pt will pick them up tomorrow (10/16/15)                                      Time Spent with Patient: 15 (10/15/15 1400)

## 2015-10-16 ENCOUNTER — Ambulatory Visit: Payer: 59

## 2015-10-16 ENCOUNTER — Encounter: Payer: Self-pay | Admitting: Oncology

## 2015-10-16 DIAGNOSIS — Z510 Encounter for antineoplastic radiation therapy: Secondary | ICD-10-CM | POA: Diagnosis not present

## 2015-10-16 NOTE — Progress Notes (Signed)
left in pod. left for dr. Jana Hakim to sign

## 2015-10-19 ENCOUNTER — Ambulatory Visit
Admission: RE | Admit: 2015-10-19 | Discharge: 2015-10-19 | Disposition: A | Discharge status: Home / Self Care | Payer: 59 | Source: Ambulatory Visit | Attending: Radiation Oncology | Admitting: Radiation Oncology

## 2015-10-19 ENCOUNTER — Inpatient Hospital Stay: Admission: RE | Admit: 2015-10-19 | Payer: Self-pay | Source: Ambulatory Visit | Admitting: Radiation Oncology

## 2015-10-19 ENCOUNTER — Encounter: Payer: Self-pay | Admitting: Radiation Oncology

## 2015-10-19 DIAGNOSIS — Z510 Encounter for antineoplastic radiation therapy: Secondary | ICD-10-CM | POA: Diagnosis not present

## 2015-10-20 ENCOUNTER — Encounter: Payer: Self-pay | Admitting: Oncology

## 2015-10-20 ENCOUNTER — Telehealth: Payer: Self-pay | Admitting: *Deleted

## 2015-10-20 NOTE — Progress Notes (Signed)
left in pod.-left for dr Jana Hakim to sign-faxed [phone] and sent to medical records

## 2015-10-20 NOTE — Telephone Encounter (Signed)
  Oncology Nurse Navigator Documentation    Navigator Encounter Type: Telephone (10/20/15 0900) Telephone: Lahoma Crocker Call (10/20/15 0900)         Patient Visit Type: RadOnc (10/20/15 0900) Treatment Phase: Final Radiation Tx (10/20/15 0900)  Relate doing well and without complaints.     Interventions: Referrals (10/20/15 0900) Referrals: Survivorship (10/20/15 0900)                    Time Spent with Patient: 15 (10/20/15 0900)

## 2015-10-23 NOTE — Progress Notes (Signed)
Name: Susan Wilson   MRN: [id number]  Date:  10/23/2015   DOB: [birth year]  Status:outpatient    DIAGNOSIS: Breast cancer of lower-inner quadrant of right female breast [facility])   Staging form: Breast, AJCC 7th Edition     Clinical stage from 12/03/2014: Stage IIA (T2, N0, M0) - Unsigned       Staging comments: Staged at breast conference on 7.20.16    CONSENT VERIFIED: yes   SET UP: Patient is setup supine   IMMOBILIZATION:  The following immobilization was used:Custom Moldable Pillow, breast board.   NARRATIVE: Susan Wilson underwent complex simulation and treatment planning for her boost treatment today.  Her tumor volume was outlined on the planning CT scan.  Due to the depth of her cavity, electrons could not be used and a photon plan was developed. The plan will be prescribed to the 100%  isodose line.   I personally supervised and approved the creation of 3 unique MLCs comprising 3   treatment devices.

## 2015-10-23 NOTE — Progress Notes (Signed)
  Radiation Oncology         [phone] ________________________________  Name: Susan Wilson MRN: [id number]  Date: 10/19/2015  DOB: [birth year]  End of Treatment Note  Diagnosis:   Breast cancer of lower-inner quadrant of right female breast [facility])   Staging form: Breast, AJCC 7th Edition     Clinical stage from 12/03/2014: Stage IIA (T2, N0, M0) - Unsigned       Staging comments: Staged at breast conference on 7.20.16  Indication for treatment: Curative    Radiation treatment dates:   09/01/2015-10/19/2015 Site/dose:    Right breast / 45 Gray @ 1.8 Pearline Cables per fraction x 25 fractions Right breast boost / 16 Gray at [employer] per fraction x 8 fractions  Beams/energy:  Opposed Tangents / 10 MV photons 6 and 10 MV photons   Narrative: The patient tolerated radiation treatment relatively well.   She had dry desquamation in the right axilla and some fatigue.   Plan: The patient has completed radiation treatment. The patient will return to radiation oncology clinic for routine followup in one month. I advised them to call or return sooner if they have any questions or concerns related to their recovery or treatment.  ------------------------------------------------  Thea Silversmith, MD

## 2015-11-16 ENCOUNTER — Ambulatory Visit (HOSPITAL_BASED_OUTPATIENT_CLINIC_OR_DEPARTMENT_OTHER): Payer: 59

## 2015-11-16 VITALS — BP 128/81 | HR 78 | Temp 98.2°F | Resp 16

## 2015-11-16 DIAGNOSIS — C50311 Malignant neoplasm of lower-inner quadrant of right female breast: Secondary | ICD-10-CM | POA: Diagnosis not present

## 2015-11-16 DIAGNOSIS — Z95828 Presence of other vascular implants and grafts: Secondary | ICD-10-CM

## 2015-11-16 DIAGNOSIS — Z452 Encounter for adjustment and management of vascular access device: Secondary | ICD-10-CM | POA: Diagnosis not present

## 2015-11-16 MED ORDER — SODIUM CHLORIDE 0.9% FLUSH
10.0000 mL | INTRAVENOUS | Status: DC | PRN
  Administered 2015-11-16: 10 mL via INTRAVENOUS
  Filled 2015-11-16: qty 10

## 2015-11-16 MED ORDER — HEPARIN SOD (PORK) LOCK FLUSH 100 UNIT/ML IV SOLN
500.0000 [IU] | Freq: Once | INTRAVENOUS | Status: AC
  Administered 2015-11-16: 500 [IU] via INTRAVENOUS
  Filled 2015-11-16: qty 5

## 2015-11-16 NOTE — Patient Instructions (Signed)

## 2015-11-18 ENCOUNTER — Ambulatory Visit: Payer: 59 | Attending: General Surgery | Admitting: Physical Therapy

## 2015-11-18 DIAGNOSIS — R293 Abnormal posture: Secondary | ICD-10-CM | POA: Diagnosis present

## 2015-11-18 DIAGNOSIS — Z9189 Other specified personal risk factors, not elsewhere classified: Secondary | ICD-10-CM | POA: Diagnosis present

## 2015-11-18 DIAGNOSIS — M25611 Stiffness of right shoulder, not elsewhere classified: Secondary | ICD-10-CM | POA: Insufficient documentation

## 2015-11-18 DIAGNOSIS — R2231 Localized swelling, mass and lump, right upper limb: Secondary | ICD-10-CM | POA: Diagnosis present

## 2015-11-18 DIAGNOSIS — M7582 Other shoulder lesions, left shoulder: Secondary | ICD-10-CM | POA: Insufficient documentation

## 2015-11-18 DIAGNOSIS — M25511 Pain in right shoulder: Secondary | ICD-10-CM | POA: Diagnosis present

## 2015-11-18 DIAGNOSIS — R6889 Other general symptoms and signs: Secondary | ICD-10-CM | POA: Insufficient documentation

## 2015-11-18 DIAGNOSIS — M6281 Muscle weakness (generalized): Secondary | ICD-10-CM | POA: Diagnosis present

## 2015-11-18 NOTE — Therapy (Signed)
[place], [place], [address] Phone: [phone]   Fax:  [phone]  Physical Therapy Evaluation  Patient Details  Name: Susan Wilson MRN: [id number] Date of Birth: 03/04/1976 Referring Provider: Dr. Donne Hazel   Encounter Date: 11/18/2015      PT End of Session - 11/18/15 1219    Visit Number 1   Number of Visits 13   Date for PT Re-Evaluation 12/30/15   PT Start Time 0845   PT Stop Time 0930   PT Time Calculation (min) 45 min   Activity Tolerance Patient tolerated treatment well   Behavior During Therapy [facility] for tasks assessed/performed      Past Medical History  Diagnosis Date  . Breast cancer of lower-inner quadrant of right female breast ([place]) 11/25/2014  . Breast cancer ([place])   . Hernia, umbilical   . Back pain   . Headache   . Anemia   . Hernia, umbilical   . Family history of breast cancer   . Anemia in neoplastic disease 04/02/2015    Past Surgical History  Procedure Laterality Date  . Portacath placement Right 12/16/2014    Procedure: INSERTION PORT-A-CATH WITH ULTRASOUND;  Surgeon: Rolm Bookbinder, MD;  Location: [place];  Service: General;  Laterality: Right;  . Radioactive seed guided mastectomy with axillary sentinel lymph node biopsy Right 06/15/2015    Procedure: RADIOACTIVE SEED GUIDED PARTIAL MASTECTOMY WITH AXILLARY SENTINEL LYMPH NODE BIOPSY;  Surgeon: Rolm Bookbinder, MD;  Location: [place];  Service: General;  Laterality: Right;  . Breast reconstruction Right 06/23/2015    Procedure: ONCOPLASTIC RIGHT BREAST REDUCTION;  Surgeon: Irene Limbo, MD;  Location: [place];  Service: Plastics;  Laterality: Right;  . Breast reduction surgery Left 06/23/2015    Procedure: MAMMARY REDUCTION  (BREAST) LEFT BREAST FOR SYMETRY (BILATERAL BREAST REDUCTION);  Surgeon: Irene Limbo, MD;  Location: [place];  Service:  Plastics;  Laterality: Left;    There were no vitals filed for this visit.       Subjective Assessment - 11/18/15 0850    Subjective I'm finished with radiation now,  but my shoulder sitll hurts    Pertinent History right lumpectomy with 6 nodes removed end of january 2017,and had breast reduction on both sides, she will have another on the left side schedules for september.  had chemotherapy before surgery and completed radiation June 5,2017    Patient Stated Goals to be able to move my arm around (rotate ) with less pain    Currently in Pain? Yes   Pain Score 6    Pain Location Shoulder   Pain Orientation Right   Pain Descriptors / Indicators Aching   Pain Type Chronic pain   Pain Onset More than a month ago   Aggravating Factors  worse with extensive reaching above her head or pullng    Pain Relieving Factors arnica gel and wraps ace wrap around it    Effect of Pain on Daily Activities affects kitchen activities             [facility] PT Assessment - 11/18/15 0001    Assessment   Medical Diagnosis breast cancer    Referring Provider Dr. Donne Hazel    Onset Date/Surgical Date 06/15/15   Hand Dominance Right   Precautions   Precautions Other (comment)   Precaution Comments previous cancer with continues tingling in fingers and toes    Restrictions   Weight Bearing Restrictions No  Balance Screen   Has the patient fallen in the past 6 months No   Has the patient had a decrease in activity level because of a fear of falling?  No   Is the patient reluctant to leave their home because of a fear of falling?  No   Home Environment   Living Environment Private residence   Living Arrangements Spouse/significant other;Children  2 children, ages 90 and 62   Available Help at Discharge Family   Prior Function   Level of [place] Unemployed   Leisure takes care of family, walks or goes to [employer],    Cognition   Overall Cognitive Status Within  Functional Limits for tasks assessed   Observation/Other Assessments   Observations well healed incision in right axilla.  Pt skin shows signs of healing from radiation.  She asked me not to look a the incisions below her breast. She is very self concious that breasts are asymmetrical and she does not like to look at them.    Skin Integrity appears intact.  pt statesshe has no opening   Other Surveys  --  lymphedema life impact scale29 or 43% impaired    Sensation   Additional Comments pt states she still has numbness and tingling in hands and feet    Coordination   Gross Motor Movements are Fluid and Coordinated Yes   Posture/Postural Control   Posture/Postural Control Postural limitations   Postural Limitations Rounded Shoulders;Forward head   AROM   Overall AROM Comments decreased stabalization of right scpaula with assymetical movement with bilateral shoulder elevation    Right Shoulder Flexion 120 Degrees   Right Shoulder ABduction 100 Degrees   Right Shoulder Internal Rotation 55 Degrees  limited by pain, measured in supine    Right Shoulder External Rotation 28 Degrees  limited by pain,measured in supine    Left Shoulder Flexion 160 Degrees   Left Shoulder ABduction 170 Degrees   Left Shoulder Internal Rotation 80 Degrees  measured in supine    Left Shoulder External Rotation 80 Degrees  with no pain    Strength   Right Shoulder Flexion 3-/5  limited by pain   Right Shoulder ABduction 3-/5  limited by pain   Right Shoulder External Rotation 3-/5  limited by pain   Left Shoulder Flexion 4/5   Left Shoulder ABduction 4/5   Left Shoulder External Rotation 4/5           LYMPHEDEMA/ONCOLOGY QUESTIONNAIRE - 11/18/15 0913    Type   Cancer Type breast   What other symptoms do you have   Are you having Pain Yes   [place] Sign No   Right Upper Extremity Lymphedema   15 cm Proximal to Olecranon Process 41.9 cm   10 cm Proximal to Olecranon Process 40.5 cm   Olecranon  Process 30.5 cm   15 cm Proximal to Ulnar Styloid Process 31.5 cm   10 cm Proximal to Ulnar Styloid Process 28.5 cm   Just Proximal to Ulnar Styloid Process 18.5 cm   Across Hand at [employer] 21.5 cm   At [place] of 2nd Digit 6.5 cm   Left Upper Extremity Lymphedema   15 cm Proximal to Olecranon Process 40.2 cm   10 cm Proximal to Olecranon Process 39 cm   Olecranon Process 29.5 cm   15 cm Proximal to Ulnar Styloid Process 30 cm   10 cm Proximal to Ulnar Styloid Process 28 cm   Just Proximal  to Ulnar Styloid Process 17.5 cm   Across Hand at [employer] 20.6 cm   At [place] of 2nd Digit 6.1 cm           Quick Dash - 11/18/15 0001    Open a tight or new jar Moderate difficulty   Do heavy household chores (wash walls, wash floors) Severe difficulty   Carry a shopping bag or briefcase Moderate difficulty   Wash your back Severe difficulty   Use a knife to cut food Mild difficulty   Recreational activities in which you take some force or impact through your arm, shoulder, or hand (golf, hammering, tennis) Severe difficulty   During the past week, to what extent has your arm, shoulder or hand problem interfered with your normal social activities with family, friends, neighbors, or groups? Modererately   During the past week, to what extent has your arm, shoulder or hand problem limited your work or other regular daily activities Modererately   Arm, shoulder, or hand pain. Severe   Tingling (pins and needles) in your arm, shoulder, or hand Moderate   Difficulty Sleeping Moderate difficulty   DASH Score 56.82 %             [facility] Adult PT Treatment/Exercise - 11/18/15 0001    Self-Care   Self-Care Other Self-Care Comments   Other Self-Care Comments  instructed pt in dowel rod shoulder elevation in supine and provided medium tg soft to upper arm small tg soft to lower arm for comfort                 PT Education - 11/18/15 1218    Education provided Yes   Education  Details do not allow tg soft to roll up on arm and cause constriction, remove tg soft and reapply ad lib    Person(s) Educated Patient   Methods Explanation;Demonstration   Comprehension Verbalized understanding           Short Term Clinic Goals - 11/18/15 1228    CC Short Term Goal  #1   Title Patient with verbalize an understanding of lymphedema risk reduction precautions   Time 3   Period Weeks   Status New   CC Short Term Goal  #2   Title Patient will be independent in a basic  home exercise program   Time 3   Period Weeks   Status New   CC Short Term Goal  #3   Title Patient will report a decrease in pain by 50% so they can perform daily activities with greater ease   Time 3   Period Weeks   Status New             Long Term Clinic Goals - 11/18/15 1230    CC Long Term Goal  #1   Title Right shoulder active flexion to at least 145 degrees for improved overhead reach.   Baseline 120 on 11/18/2015   Time 6   Period Weeks   Status New   CC Long Term Goal  #2   Title Right shoulder external rotation to 70 degrees. to allow for better shoulder abduction    Baseline 55 on 11/18/2015   Time 6   Period Weeks   Status New   CC Long Term Goal  #3   Title Left shoulder abduction to at least 150 degrees for improved ADLs.   Baseline 100 on 11/18/2015   Time 6   Period Weeks   Status New  CC Long Term Goal  #4   Title Patient will decrease the DASH score to < 30   to demonstrate increased functional use of upper extremity   Baseline 56.82 on 11/18/2015   Time 6   Period Weeks   Status New   CC Long Term Goal  #5   Title Patient will report a decrease in pain by 75% so they can perform daily activities with greater ease   Baseline 6   Time 6   Period Weeks   Status New            Plan - 11/18/15 1220    Clinical Impression Statement 40 yo who has had serverl months of treatment for breast cancer comes to [place] with shoulder pain, stiffness and swelling. She needs  more eduction about lymphedema risk reduction and assist for return to painfree UE functionl   Rehab Potential Good   Clinical Impairments Affecting Rehab Potential previous chemo and right upper quadrant range ot motiion   PT Frequency 2x / week   PT Duration 6 weeks   PT Treatment/Interventions ADLs/Self Care Home Management;Manual lymph drainage;Compression bandaging;Scar mobilization;Passive range of motion;Patient/family education;DME Instruction;Functional mobility training;Electrical Stimulation;Therapeutic activities;Therapeutic exercise;Manual techniques;Taping   PT Next Visit Plan Begin with lymphatic flow yoga, do brief manual lymph drainage to right upper quadrant, supine scapular exercise with yellow theraband if tolerated. assess effectiveness of TG soft  sign up for ABC class   Recommended Other Services ABC class   Consulted and Agree with Plan of Care Patient      Patient will benefit from skilled therapeutic intervention in order to improve the following deficits and impairments:  Decreased range of motion, Increased fascial restricitons, Impaired UE functional use, Decreased activity tolerance, Decreased knowledge of precautions, Impaired perceived functional ability, Pain, Decreased knowledge of use of DME, Decreased strength, Increased edema, Postural dysfunction  Visit Diagnosis: Stiffness of right shoulder, not elsewhere classified - Plan: PT plan of care cert/re-cert  Pain in right shoulder - Plan: PT plan of care cert/re-cert  Localized swelling, mass and lump, right upper limb - Plan: PT plan of care cert/re-cert  Abnormal posture - Plan: PT plan of care cert/re-cert  Muscle weakness (generalized) - Plan: PT plan of care cert/re-cert     Problem List Patient Active Problem List   Diagnosis Date Noted  . Chemotherapy induced neutropenia ([place]) 05/14/2015  . Chemotherapy-induced neuropathy ([place]) 04/08/2015  . 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 Tooth  ache 01/09/2015  . Genetic testing 12/26/2014  . Family history of breast cancer   . Breast cancer of lower-inner quadrant of right female breast ([place]) 11/25/2014  . Migraine 11/19/2014    Donato Heinz. Owens Shark PT   Norwood Levo 11/18/2015, 12:37 PM  [place], [place], [address] Phone: [phone]   Fax:  [phone]  Name: Susan Wilson MRN: [id number] Date of Birth: 04-01-1976

## 2015-11-18 NOTE — Patient Instructions (Signed)
Cane Overhead - Supine  Hold cane at thighs with both hands, extend arms straight over head. Hold _3__ seconds. talke a deep breath Repeat _5-10__ times. Do __2_ times per day.

## 2015-11-24 ENCOUNTER — Ambulatory Visit: Payer: 59 | Admitting: Physical Therapy

## 2015-11-24 DIAGNOSIS — R2231 Localized swelling, mass and lump, right upper limb: Secondary | ICD-10-CM

## 2015-11-24 DIAGNOSIS — M6281 Muscle weakness (generalized): Secondary | ICD-10-CM

## 2015-11-24 DIAGNOSIS — R293 Abnormal posture: Secondary | ICD-10-CM

## 2015-11-24 DIAGNOSIS — M25611 Stiffness of right shoulder, not elsewhere classified: Secondary | ICD-10-CM | POA: Diagnosis not present

## 2015-11-24 DIAGNOSIS — M25511 Pain in right shoulder: Secondary | ICD-10-CM

## 2015-11-24 NOTE — Therapy (Signed)
[place], [place], [address] Phone: [phone]   Fax:  [phone]  Physical Therapy Treatment  Patient Details  Name: Susan Wilson MRN: [id number] Date of Birth: 11-06-1975 Referring Provider: Dr. Donne Hazel   Encounter Date: 11/24/2015      PT End of Session - 11/24/15 1230    Visit Number 0   Number of Visits 13   Date for PT Re-Evaluation 12/30/15   PT Start Time 6546   PT Stop Time 1100   PT Time Calculation (min) 45 min   Activity Tolerance Patient tolerated treatment well   Behavior During Therapy [facility] for tasks assessed/performed      Past Medical History  Diagnosis Date  . Breast cancer of lower-inner quadrant of right female breast ([place]) 11/25/2014  . Breast cancer ([place])   . Hernia, umbilical   . Back pain   . Headache   . Anemia   . Hernia, umbilical   . Family history of breast cancer   . Anemia in neoplastic disease 04/02/2015    Past Surgical History  Procedure Laterality Date  . Portacath placement Right 12/16/2014    Procedure: INSERTION PORT-A-CATH WITH ULTRASOUND;  Surgeon: Rolm Bookbinder, MD;  Location: [place];  Service: General;  Laterality: Right;  . Radioactive seed guided mastectomy with axillary sentinel lymph node biopsy Right 06/15/2015    Procedure: RADIOACTIVE SEED GUIDED PARTIAL MASTECTOMY WITH AXILLARY SENTINEL LYMPH NODE BIOPSY;  Surgeon: Rolm Bookbinder, MD;  Location: [place];  Service: General;  Laterality: Right;  . Breast reconstruction Right 06/23/2015    Procedure: ONCOPLASTIC RIGHT BREAST REDUCTION;  Surgeon: Irene Limbo, MD;  Location: [place];  Service: Plastics;  Laterality: Right;  . Breast reduction surgery Left 06/23/2015    Procedure: MAMMARY REDUCTION  (BREAST) LEFT BREAST FOR SYMETRY (BILATERAL BREAST REDUCTION);  Surgeon: Irene Limbo, MD;  Location: [place];  Service:  Plastics;  Laterality: Left;    There were no vitals filed for this visit.      Subjective Assessment - 11/24/15 1022    Subjective Its pretty Ok today.  She is using arnica gel. The tg soft sleeve helps too and she uses it as needed    Pertinent History right lumpectomy with 6 nodes removed end of january 2017,and had breast reduction on both sides, she will have another on the left side schedules for september.  had chemotherapy before surgery and completed radiation June 5,2017    Patient Stated Goals to be able to move my arm around (rotate ) with less pain    Currently in Pain? Yes   Pain Score 2    Pain Orientation Right   Pain Type Chronic pain                         [facility] Adult PT Treatment/Exercise - 11/24/15 0001    Self-Care   Other Self-Care Comments  issued Celine Ahr training DVD for self manual lymph drainage at home and to ask husband to help at back    Neck Exercises: Seated   Cervical Rotation Both;Limitations   Cervical Rotation Limitations limited by any sensation from port stretch    Lateral Flexion Both;Limitations   Lateral Flexion Limitations limited by port stretch    Shoulder Rolls Backwards;5 reps   Other Seated Exercise upper thoracic flex, extension, rotation and sidebend    Shoulder Exercises: Supine   Flexion Strengthening;Both;5 reps  wide and narrow grip    Theraband Level (Shoulder Flexion) Level 1 (Yellow)   Manual Therapy   Manual Lymphatic Drainage (MLD) in supine, short neck, superficial and deep abdominals, right inguinal nodes, right axillo-inguinal anastamosis, left axillary nodes, anterior interaxillay anastaosis avoiding port on right chest, right anterior and posterior shoulder upper arm forearm and hand and returning along pathways. then to left sidelying for posterior interaxillary anastamosis and back                 PT Education - 11/24/15 1201    Education provided Yes   Education Details self manual lymph  drainage, neck and shoulder exercise , issued handout for ABC class    Person(s) Educated Patient   Methods Explanation;Demonstration;Other (comment)  weblink, DVD   Comprehension Verbalized understanding;Need further instruction           Short Term Clinic Goals - 11/18/15 1228    CC Short Term Goal  #1   Title Patient with verbalize an understanding of lymphedema risk reduction precautions   Time 3   Period Weeks   Status New   CC Short Term Goal  #2   Title Patient will be independent in a basic  home exercise program   Time 3   Period Weeks   Status New   CC Short Term Goal  #3   Title Patient will report a decrease in pain by 50% so they can perform daily activities with greater ease   Time 3   Period Weeks   Status New             Long Term Clinic Goals - 11/18/15 1230    CC Long Term Goal  #1   Title Right shoulder active flexion to at least 145 degrees for improved overhead reach.   Baseline 120 on 11/18/2015   Time 6   Period Weeks   Status New   CC Long Term Goal  #2   Title Right shoulder external rotation to 70 degrees. to allow for better shoulder abduction    Baseline 55 on 11/18/2015   Time 6   Period Weeks   Status New   CC Long Term Goal  #3   Title Left shoulder abduction to at least 150 degrees for improved ADLs.   Baseline 100 on 11/18/2015   Time 6   Period Weeks   Status New   CC Long Term Goal  #4   Title Patient will decrease the DASH score to < 30   to demonstrate increased functional use of upper extremity   Baseline 56.82 on 11/18/2015   Time 6   Period Weeks   Status New   CC Long Term Goal  #5   Title Patient will report a decrease in pain by 75% so they can perform daily activities with greater ease   Baseline 6   Time 6   Period Weeks   Status New            Plan - 11/24/15 1231    Clinical Impression Statement Pt thinks she is making gradual improvement, but still has pain decreased range of motion and fullness in right  upper quadrant. She felt better after treatment today and will continue with home program.    Rehab Potential Good   Clinical Impairments Affecting Rehab Potential previous chemo and right upper quadrant range ot motiion   PT Next Visit Plan assess home ex and use of DVD for self manual lymph drainage. Begin  with lymphatic flow yoga, do brief manual lymph drainage to right upper quadrant, perform shoulder PROM complete instruction in supine scapular exercise with yellow theraband  Remeasure shoulder range of motion    Consulted and Agree with Plan of Care Patient      Patient will benefit from skilled therapeutic intervention in order to improve the following deficits and impairments:  Decreased range of motion, Increased fascial restricitons, Impaired UE functional use, Decreased activity tolerance, Decreased knowledge of precautions, Impaired perceived functional ability, Pain, Decreased knowledge of use of DME, Decreased strength, Increased edema, Postural dysfunction  Visit Diagnosis: Stiffness of right shoulder, not elsewhere classified  Pain in right shoulder  Localized swelling, mass and lump, right upper limb  Abnormal posture  Muscle weakness (generalized)     Problem List Patient Active Problem List   Diagnosis Date Noted  . Chemotherapy induced neutropenia ([place]) 05/14/2015  . Chemotherapy-induced neuropathy ([place]) 04/08/2015  . 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 Tooth ache 01/09/2015  . Genetic testing 12/26/2014  . Family history of breast cancer   . Breast cancer of lower-inner quadrant of right female breast ([place]) 11/25/2014  . Migraine 11/19/2014   Donato Heinz. Owens Shark PT   Norwood Levo 11/24/2015, 12:38 PM  [place], [place], [address] Phone: [phone]   Fax:  [phone]  Name: BRITLYN MARTINE MRN: [id number] Date of Birth: 1975/10/08

## 2015-11-24 NOTE — Patient Instructions (Addendum)
[email or web address] Lymphatic flow series with Shoosh Lettick Crotzer     Over Head Pull: Narrow Grip       On back, knees bent, feet flat, band across thighs, elbows straight but relaxed. Pull hands apart (start). Keeping elbows straight, bring arms up and over head, hands toward floor. Keep pull steady on band. Hold momentarily. Return slowly, keeping pull steady, back to start. Repeat __5-10 times. Band color _yellow_____   Side Pull: Double Arm   On back, knees bent, feet flat. Arms perpendicular to body, shoulder level, elbows straight but relaxed. Pull arms out to sides, elbows straight. Resistance band comes across collarbones, hands toward floor. Hold momentarily. Slowly return to starting position. Repeat _5-10__ times. Band color yellow ____   Sash   On back, knees bent, feet flat, left hand on left hip, right hand above left. Pull right arm DIAGONALLY (hip to shoulder) across chest. Bring right arm along head toward floor. Hold momentarily. Slowly return to starting position. Repeat _5-10__ times. Do with left arm. Band color  __yellow____   Shoulder Rotation: Double Arm   On back, knees bent, feet flat, elbows tucked at sides, bent 90, hands palms up. Pull hands apart and down toward floor, keeping elbows near sides. Hold momentarily. Slowly return to starting position. Repeat _5-10__ times. Band color __yellow____

## 2015-11-26 ENCOUNTER — Ambulatory Visit: Payer: 59 | Admitting: Physical Therapy

## 2015-11-26 DIAGNOSIS — R293 Abnormal posture: Secondary | ICD-10-CM

## 2015-11-26 DIAGNOSIS — R2231 Localized swelling, mass and lump, right upper limb: Secondary | ICD-10-CM

## 2015-11-26 DIAGNOSIS — M25611 Stiffness of right shoulder, not elsewhere classified: Secondary | ICD-10-CM | POA: Diagnosis not present

## 2015-11-26 DIAGNOSIS — M6281 Muscle weakness (generalized): Secondary | ICD-10-CM

## 2015-11-26 DIAGNOSIS — M25511 Pain in right shoulder: Secondary | ICD-10-CM

## 2015-11-26 NOTE — Therapy (Signed)
[place], [place], [address] Phone: [phone]   Fax:  [phone]  Physical Therapy Treatment  Patient Details  Name: Susan Wilson MRN: [id number] Date of Birth: October 31, 1975 Referring Provider: Dr. Donne Hazel   Encounter Date: 11/26/2015      PT End of Session - 11/26/15 1244    Visit Number 3   Number of Visits 13   Date for PT Re-Evaluation 12/30/15   PT Start Time 7253   PT Stop Time 1100   PT Time Calculation (min) 45 min   Activity Tolerance Patient tolerated treatment well   Behavior During Therapy [facility] for tasks assessed/performed      Past Medical History  Diagnosis Date  . Breast cancer of lower-inner quadrant of right female breast ([place]) 11/25/2014  . Breast cancer ([place])   . Hernia, umbilical   . Back pain   . Headache   . Anemia   . Hernia, umbilical   . Family history of breast cancer   . Anemia in neoplastic disease 04/02/2015    Past Surgical History  Procedure Laterality Date  . Portacath placement Right 12/16/2014    Procedure: INSERTION PORT-A-CATH WITH ULTRASOUND;  Surgeon: Rolm Bookbinder, MD;  Location: [place];  Service: General;  Laterality: Right;  . Radioactive seed guided mastectomy with axillary sentinel lymph node biopsy Right 06/15/2015    Procedure: RADIOACTIVE SEED GUIDED PARTIAL MASTECTOMY WITH AXILLARY SENTINEL LYMPH NODE BIOPSY;  Surgeon: Rolm Bookbinder, MD;  Location: [place];  Service: General;  Laterality: Right;  . Breast reconstruction Right 06/23/2015    Procedure: ONCOPLASTIC RIGHT BREAST REDUCTION;  Surgeon: Irene Limbo, MD;  Location: [place];  Service: Plastics;  Laterality: Right;  . Breast reduction surgery Left 06/23/2015    Procedure: MAMMARY REDUCTION  (BREAST) LEFT BREAST FOR SYMETRY (BILATERAL BREAST REDUCTION);  Surgeon: Irene Limbo, MD;  Location: [place];  Service:  Plastics;  Laterality: Left;    There were no vitals filed for this visit.      Subjective Assessment - 11/26/15 1020    Subjective It feels looser    Pertinent History right lumpectomy with 6 nodes removed end of january 2017,and had breast reduction on both sides, she will have another on the left side schedules for september.  had chemotherapy before surgery and completed radiation June 5,2017    Patient Stated Goals to be able to move my arm around (rotate ) with less pain    Pain Score 2    Pain Descriptors / Indicators Aching            [facility] PT Assessment - 11/26/15 0001    AROM   Right Shoulder Flexion 133 Degrees   Right Shoulder ABduction 135 Degrees                     [facility] Adult PT Treatment/Exercise - 11/26/15 0001    Self-Care   Other Self-Care Comments  issued Geologist, engineering training DVD for self manual lymph drainage at home and to ask husband to help at back    Neck Exercises: Seated   Cervical Rotation Both;Limitations   Cervical Rotation Limitations limited by any sensation from port stretch    Lateral Flexion Both;Limitations   Lateral Flexion Limitations limited by port stretch    Shoulder Rolls Backwards;5 reps   Other Seated Exercise upper thoracic flex, extension, rotation and sidebend    Other Seated Exercise isolated right scapular retraction  and depression    Shoulder Exercises: Supine   Protraction AROM;Right;10 reps   Flexion AAROM   Flexion Limitations dowel rod x 10   Other Supine Exercises small circles at very slow and quick speed    Shoulder Exercises: Sidelying   External Rotation AROM;Right;10 reps   ABduction AROM;Right;10 reps   ABduction Limitations stretch overhead    Other Sidelying Exercises small circles in each direction    Shoulder Exercises: Stretch   Table Stretch - Abduction 1 rep;30 seconds   Table Stretch - ABduction Limitations small ball placed at posterior axilla for myofascial release    Manual Therapy    Myofascial Release to trigger point at posterior axilla with active release and prolonged pressure. This caused increased achiness for paient t    Manual Lymphatic Drainage (MLD) in supine, short neck, superficial and deep abdominals, right inguinal nodes, right axillo-inguinal anastamosis, left axillary nodes, anterior interaxillay anastaosis avoiding port on right chest, right anterior and posterior shoulder upper arm forearm and hand and returning along pathways. then to left sidelying for posterior interaxillary anastamosis and back                 PT Education - 11/26/15 1244    Education provided Yes   Education Details table stretch    Person(s) Educated Patient   Methods Explanation;Demonstration   Comprehension Verbalized understanding;Returned demonstration           Short Term Clinic Goals - 11/18/15 1228    CC Short Term Goal  #1   Title Patient with verbalize an understanding of lymphedema risk reduction precautions   Time 3   Period Weeks   Status New   CC Short Term Goal  #2   Title Patient will be independent in a basic  home exercise program   Time 3   Period Weeks   Status New   CC Short Term Goal  #3   Title Patient will report a decrease in pain by 50% so they can perform daily activities with greater ease   Time 3   Period Weeks   Status New             Long Term Clinic Goals - 11/26/15 1248    CC Long Term Goal  #1   Title Right shoulder active flexion to at least 145 degrees for improved overhead reach.   Baseline 120 on 11/18/2015, 133 on 11/26/2015    Status On-going   CC Long Term Goal  #2   Title Right shoulder external rotation to 70 degrees. to allow for better shoulder abduction    Status On-going   CC Long Term Goal  #3   Title Left shoulder abduction to at least 150 degrees for improved ADLs.   Baseline 100 on 11/18/2015, 135 on 11/26/2015    Status On-going   CC Long Term Goal  #4   Title Patient will decrease the DASH score to  < 30   to demonstrate increased functional use of upper extremity   Status On-going   CC Long Term Goal  #5   Title Patient will report a decrease in pain by 75% so they can perform daily activities with greater ease   Status On-going            Plan - 11/26/15 1244    Clinical Impression Statement Pt continues to have congestion with muscle tightness and trigger points at posterior axilla.  Upgraded program to include manual myofascial work to this area  with instruction in home stretching.  encouraged pt to contine with active movement with right arm with attention to keep shoudler down and away    Rehab Potential Good   PT Next Visit Plan assess home ex and use of DVD for self manual lymph drainage. Begin with lymphatic flow yoga, do brief manual lymph drainage to right upper quadrant, perform shoulder PROM complete instruction in supine scapular exercise with yellow theraband  Remeasure shoulder range of motion  coninue with myofacscila work to right posteior axilla    Consulted and Agree with Plan of Care Patient      Patient will benefit from skilled therapeutic intervention in order to improve the following deficits and impairments:  Decreased range of motion, Increased fascial restricitons, Impaired UE functional use, Decreased activity tolerance, Decreased knowledge of precautions, Impaired perceived functional ability, Pain, Decreased knowledge of use of DME, Decreased strength, Increased edema, Postural dysfunction  Visit Diagnosis: Stiffness of right shoulder, not elsewhere classified  Pain in right shoulder  Localized swelling, mass and lump, right upper limb  Abnormal posture  Muscle weakness (generalized)     Problem List Patient Active Problem List   Diagnosis Date Noted  . Chemotherapy induced neutropenia ([place]) 05/14/2015  . Chemotherapy-induced neuropathy ([place]) 04/08/2015  . 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 Tooth ache 01/09/2015  . Genetic testing  12/26/2014  . Family history of breast cancer   . Breast cancer of lower-inner quadrant of right female breast ([place]) 11/25/2014  . Migraine 11/19/2014  Donato Heinz. Owens Shark PT    Norwood Levo 11/26/2015, 12:49 PM  [place], [place], [address] Phone: [phone]   Fax:  [phone]  Name: CHRISANN MELARAGNO MRN: [id number] Date of Birth: 07/10/1975

## 2015-11-28 ENCOUNTER — Other Ambulatory Visit: Payer: Self-pay | Admitting: Radiation Oncology

## 2015-12-01 ENCOUNTER — Other Ambulatory Visit: Payer: Self-pay | Admitting: *Deleted

## 2015-12-01 ENCOUNTER — Ambulatory Visit: Payer: 59

## 2015-12-01 DIAGNOSIS — M25611 Stiffness of right shoulder, not elsewhere classified: Secondary | ICD-10-CM | POA: Diagnosis not present

## 2015-12-01 DIAGNOSIS — M6281 Muscle weakness (generalized): Secondary | ICD-10-CM

## 2015-12-01 DIAGNOSIS — M25612 Stiffness of left shoulder, not elsewhere classified: Secondary | ICD-10-CM

## 2015-12-01 DIAGNOSIS — M25511 Pain in right shoulder: Secondary | ICD-10-CM

## 2015-12-01 DIAGNOSIS — R2231 Localized swelling, mass and lump, right upper limb: Secondary | ICD-10-CM

## 2015-12-01 DIAGNOSIS — R293 Abnormal posture: Secondary | ICD-10-CM

## 2015-12-01 DIAGNOSIS — C50311 Malignant neoplasm of lower-inner quadrant of right female breast: Secondary | ICD-10-CM

## 2015-12-01 DIAGNOSIS — Z9189 Other specified personal risk factors, not elsewhere classified: Secondary | ICD-10-CM

## 2015-12-01 DIAGNOSIS — R6889 Other general symptoms and signs: Secondary | ICD-10-CM

## 2015-12-01 NOTE — Therapy (Signed)
[place], [place], [address] Phone: [phone]   Fax:  [phone]  Physical Therapy Treatment  Patient Details  Name: Susan Wilson MRN: [id number] Date of Birth: 05-Dec-1975 Referring Provider: Dr. Donne Hazel   Encounter Date: 12/01/2015      PT End of Session - 12/01/15 1109    Visit Number 4   Number of Visits 13   Date for PT Re-Evaluation 12/30/15   PT Start Time 1021   PT Stop Time 1104   PT Time Calculation (min) 43 min   Activity Tolerance Patient tolerated treatment well   Behavior During Therapy [facility] for tasks assessed/performed      Past Medical History  Diagnosis Date  . Breast cancer of lower-inner quadrant of right female breast ([place]) 11/25/2014  . Breast cancer ([place])   . Hernia, umbilical   . Back pain   . Headache   . Anemia   . Hernia, umbilical   . Family history of breast cancer   . Anemia in neoplastic disease 04/02/2015    Past Surgical History  Procedure Laterality Date  . Portacath placement Right 12/16/2014    Procedure: INSERTION PORT-A-CATH WITH ULTRASOUND;  Surgeon: Rolm Bookbinder, MD;  Location: [place];  Service: General;  Laterality: Right;  . Radioactive seed guided mastectomy with axillary sentinel lymph node biopsy Right 06/15/2015    Procedure: RADIOACTIVE SEED GUIDED PARTIAL MASTECTOMY WITH AXILLARY SENTINEL LYMPH NODE BIOPSY;  Surgeon: Rolm Bookbinder, MD;  Location: [place];  Service: General;  Laterality: Right;  . Breast reconstruction Right 06/23/2015    Procedure: ONCOPLASTIC RIGHT BREAST REDUCTION;  Surgeon: Irene Limbo, MD;  Location: [place];  Service: Plastics;  Laterality: Right;  . Breast reduction surgery Left 06/23/2015    Procedure: MAMMARY REDUCTION  (BREAST) LEFT BREAST FOR SYMETRY (BILATERAL BREAST REDUCTION);  Surgeon: Irene Limbo, MD;  Location: [place];  Service:  Plastics;  Laterality: Left;    There were no vitals filed for this visit.      Subjective Assessment - 12/01/15 1022    Subjective I've watched the video a few times and found it helpful. Will bring it back after my husband also gets to watch it as well.    Pertinent History right lumpectomy with 6 nodes removed end of january 2017,and had breast reduction on both sides, she will have another on the left side schedules for september.  had chemotherapy before surgery and completed radiation June 5,2017    Patient Stated Goals to be able to move my arm around (rotate ) with less pain    Currently in Pain? Yes   Pain Score 2    Pain Location Shoulder   Pain Orientation Right   Pain Descriptors / Indicators Aching   Pain Type Chronic pain   Pain Onset More than a month ago   Pain Frequency Constant   Aggravating Factors  reaching overhead   Pain Relieving Factors arnica gel and using ace wrap                         [facility] Adult PT Treatment/Exercise - 12/01/15 0001    Manual Therapy   Myofascial Release --   Manual Lymphatic Drainage (MLD) in supine, short neck, superficial and deep abdominals, right inguinal nodes, right axillo-inguinal anastomosis, left axillary nodes, anterior interaxillay anastaosis avoiding port on right chest, right anterior and posterior shoulder upper arm forearm and hand  and returning along pathways. then to left sidelying for posterior interaxillary anastamosis and back reviewing sequence with pt throughout   Passive ROM In Supine to Rt shoulder into flexion, abduction and D2 all gently and to pts tolerance.                   Short Term Clinic Goals - 11/18/15 1228    CC Short Term Goal  #1   Title Patient with verbalize an understanding of lymphedema risk reduction precautions   Time 3   Period Weeks   Status New   CC Short Term Goal  #2   Title Patient will be independent in a basic  home exercise program   Time 3   Period  Weeks   Status New   CC Short Term Goal  #3   Title Patient will report a decrease in pain by 50% so they can perform daily activities with greater ease   Time 3   Period Weeks   Status New             Long Term Clinic Goals - 11/26/15 1248    CC Long Term Goal  #1   Title Right shoulder active flexion to at least 145 degrees for improved overhead reach.   Baseline 120 on 11/18/2015, 133 on 11/26/2015    Status On-going   CC Long Term Goal  #2   Title Right shoulder external rotation to 70 degrees. to allow for better shoulder abduction    Status On-going   CC Long Term Goal  #3   Title Left shoulder abduction to at least 150 degrees for improved ADLs.   Baseline 100 on 11/18/2015, 135 on 11/26/2015    Status On-going   CC Long Term Goal  #4   Title Patient will decrease the DASH score to < 30   to demonstrate increased functional use of upper extremity   Status On-going   CC Long Term Goal  #5   Title Patient will report a decrease in pain by 75% so they can perform daily activities with greater ease   Status On-going            Plan - 12/01/15 1109    Clinical Impression Statement Reviewed with patient sequence of manual lymph drainage today which she reported feeling better about by end of session. Also began some gentle P/ROM to Rt shoulder which pt is very tight but responded well to this treatment.   Rehab Potential Good   Clinical Impairments Affecting Rehab Potential previous chemo and right upper quadrant range ot motiion   PT Frequency 2x / week   PT Duration 6 weeks   PT Treatment/Interventions ADLs/Self Care Home Management;Manual lymph drainage;Compression bandaging;Scar mobilization;Passive range of motion;Patient/family education;DME Instruction;Functional mobility training;Electrical Stimulation;Therapeutic activities;Therapeutic exercise;Manual techniques;Taping   PT Next Visit Plan assess goals and home ex  Begin with lymphatic flow yoga, do brief manual  lymph drainage to right upper quadrant, perform shoulder PROM complete instruction in supine scapular exercise with yellow theraband  Remeasure shoulder range of motion  coninue with myofacscila work to right posteior axilla    Consulted and Agree with Plan of Care Patient      Patient will benefit from skilled therapeutic intervention in order to improve the following deficits and impairments:  Decreased range of motion, Increased fascial restricitons, Impaired UE functional use, Decreased activity tolerance, Decreased knowledge of precautions, Impaired perceived functional ability, Pain, Decreased knowledge of use of DME, Decreased strength, Increased edema,  Postural dysfunction  Visit Diagnosis: Stiffness of right shoulder, not elsewhere classified  Pain in right shoulder  Localized swelling, mass and lump, right upper limb  Abnormal posture  Muscle weakness (generalized)  Decreased ROM of right shoulder  Decreased ROM of left shoulder  At risk for lymphedema  Impaired function of upper extremity     Problem List Patient Active Problem List   Diagnosis Date Noted  . Chemotherapy induced neutropenia ([place]) 05/14/2015  . Chemotherapy-induced neuropathy ([place]) 04/08/2015  . 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 Tooth ache 01/09/2015  . Genetic testing 12/26/2014  . Family history of breast cancer   . Breast cancer of lower-inner quadrant of right female breast ([place]) 11/25/2014  . Migraine 11/19/2014    Otelia Limes, PTA 12/01/2015, 11:11 AM  [place], [place], [address] Phone: [phone]   Fax:  [phone]  Name: Susan Wilson MRN: [id number] Date of Birth: 12-Oct-1975

## 2015-12-03 ENCOUNTER — Ambulatory Visit: Payer: Self-pay | Admitting: Radiation Oncology

## 2015-12-03 ENCOUNTER — Telehealth: Payer: Self-pay | Admitting: Oncology

## 2015-12-03 ENCOUNTER — Other Ambulatory Visit (HOSPITAL_BASED_OUTPATIENT_CLINIC_OR_DEPARTMENT_OTHER): Payer: 59

## 2015-12-03 ENCOUNTER — Ambulatory Visit (HOSPITAL_BASED_OUTPATIENT_CLINIC_OR_DEPARTMENT_OTHER): Payer: 59 | Admitting: Oncology

## 2015-12-03 ENCOUNTER — Ambulatory Visit: Payer: 59

## 2015-12-03 VITALS — BP 137/98 | HR 88 | Temp 98.7°F | Resp 18 | Ht 65.0 in | Wt 234.2 lb

## 2015-12-03 DIAGNOSIS — M25611 Stiffness of right shoulder, not elsewhere classified: Secondary | ICD-10-CM | POA: Diagnosis not present

## 2015-12-03 DIAGNOSIS — R293 Abnormal posture: Secondary | ICD-10-CM

## 2015-12-03 DIAGNOSIS — M25511 Pain in right shoulder: Secondary | ICD-10-CM

## 2015-12-03 DIAGNOSIS — R2231 Localized swelling, mass and lump, right upper limb: Secondary | ICD-10-CM

## 2015-12-03 DIAGNOSIS — M6281 Muscle weakness (generalized): Secondary | ICD-10-CM

## 2015-12-03 DIAGNOSIS — C50311 Malignant neoplasm of lower-inner quadrant of right female breast: Secondary | ICD-10-CM | POA: Diagnosis not present

## 2015-12-03 LAB — CBC WITH DIFFERENTIAL/PLATELET
BASO%: 0.3 % (ref 0.0–2.0)
BASOS ABS: 0 10*3/uL (ref 0.0–0.1)
EOS ABS: 0 10*3/uL (ref 0.0–0.5)
EOS%: 1.1 % (ref 0.0–7.0)
HCT: 37.1 % (ref 34.8–46.6)
HGB: 11.9 g/dL (ref 11.6–15.9)
LYMPH%: 32.1 % (ref 14.0–49.7)
MCH: 25.5 pg (ref 25.1–34.0)
MCHC: 32.1 g/dL (ref 31.5–36.0)
MCV: 79.4 fL — AB (ref 79.5–101.0)
MONO#: 0.3 10*3/uL (ref 0.1–0.9)
MONO%: 8.5 % (ref 0.0–14.0)
NEUT%: 58 % (ref 38.4–76.8)
NEUTROS ABS: 2.1 10*3/uL (ref 1.5–6.5)
PLATELETS: 225 10*3/uL (ref 145–400)
RBC: 4.67 10*6/uL (ref 3.70–5.45)
RDW: 14.7 % — ABNORMAL HIGH (ref 11.2–14.5)
WBC: 3.6 10*3/uL — ABNORMAL LOW (ref 3.9–10.3)
lymph#: 1.1 10*3/uL (ref 0.9–3.3)

## 2015-12-03 LAB — COMPREHENSIVE METABOLIC PANEL
ALBUMIN: 4 g/dL (ref 3.5–5.0)
ALK PHOS: 79 U/L (ref 40–150)
ALT: 26 U/L (ref 0–55)
ANION GAP: 8 meq/L (ref 3–11)
AST: 21 U/L (ref 5–34)
BILIRUBIN TOTAL: 0.81 mg/dL (ref 0.20–1.20)
BUN: 12 mg/dL (ref 7.0–26.0)
CO2: 26 meq/L (ref 22–29)
Calcium: 9.4 mg/dL (ref 8.4–10.4)
Chloride: 104 mEq/L (ref 98–109)
Creatinine: 0.8 mg/dL (ref 0.6–1.1)
Glucose: 100 mg/dl (ref 70–140)
POTASSIUM: 4.1 meq/L (ref 3.5–5.1)
Sodium: 138 mEq/L (ref 136–145)
TOTAL PROTEIN: 7.8 g/dL (ref 6.4–8.3)

## 2015-12-03 NOTE — Patient Instructions (Signed)
Over Head Pull: Narrow and Wider Grip     Cancer Rehab [phone]   On back, knees bent, feet flat, band across thighs, elbows straight but relaxed. Pull hands apart (start). Keeping elbows straight, bring arms up and over head, hands toward floor. Keep pull steady on band. Hold momentarily. Return slowly, keeping pull steady, back to start. And then do with a wider grip. Repeat _5-10__ times. Band color __yellow____   Side Pull: Double Arm   On back, knees bent, feet flat. Arms perpendicular to body, shoulder level, elbows straight but relaxed. Pull arms out to sides, elbows straight. Resistance band comes across collarbones, hands toward floor. Hold momentarily. Slowly return to starting position. Repeat _5-10__ times. Band color _yellow____   Sword   On back, knees bent, feet flat, left hand on left hip, right hand above left. Pull right arm DIAGONALLY (hip to shoulder) across chest. Bring right arm along head toward floor. Hold momentarily. Slowly return to starting position. Repeat _5-10__ times. Do with left arm. Band color _yellow_____   Shoulder Rotation: Double Arm  (if this continues to hurt at top of shoulder STOP)   On back, knees bent, feet flat, elbows tucked at sides, bent 90, hands palms up. Pull hands apart and down toward floor, keeping elbows near sides. Hold momentarily. Slowly return to starting position. Repeat _5-10__ times. Band color __yellow____

## 2015-12-03 NOTE — Progress Notes (Signed)
Susan Wilson  [phone] [phone]     ID: Susan Wilson DOB: 1976-05-15  MR#: [id number]  OBS#:[id number]  Patient Care Team: Donnamae Jude, MD as PCP - General (Obstetrics and Gynecology) Rolm Bookbinder, MD as Consulting Physician (General Surgery) Chauncey Cruel, MD as Consulting Physician (Oncology) Thea Silversmith, MD as Consulting Physician (Radiation Oncology) [name], RN as Registered Nurse Rockwell Germany, RN as Registered Nurse Holley Bouche, NP as Nurse Practitioner (Nurse Practitioner) PCP: Donnamae Jude, MD OTHER MD:  CHIEF COMPLAINT: Triple negative breast cancer  CURRENT TREATMENT: Observation  BREAST CANCER HISTORY: From the original intake note:  [name] herself noted a mass in her right breast sometime around April. Initially she thought it might be related to menstruation, but as it did not change and eventually became tender, she brought it to her physician's attention. On 11/20/2014 patient underwent bilateral diagnostic mammography with tomosynthesis and right breast ultrasonography at the breast Center. The breast density was category B. There was a hyperdense mass in the right lower inner quadrant associated with skin thickening. There was also a 5 mm nodule posteriorly at the 8:30 o'clock position in the right breast. There were several hyperdense nodules in the right axilla. On physical exam there was a firm fixed mass in the right breast at the 5:00 position. By ultrasound this was lobulated and appear to involve the skin. It measures up to 4.1 cm. There was no sonographic correlation to the 5 mm nodule seen in a different area of the right breast. The right axilla showed 3 hypervascular lymph nodes with prominent cortical thickening, measuring less than 1.5 cm.  On 11/21/2014 the patient underwent right breast biopsy (5:00 mass) and biopsy of one of the suspicious right axillary lymph nodes. The  pathology ([facility]AA [phone]) showed the breast biopsy to consist of invasive ductal carcinoma, grade 2, estrogen receptor and progesterone receptor negative, with an MIB-1 of 20%, and HER-2 equivocal, with the signals ratio of 1.41, but the average copy number per cell 4.35.  The patient's subsequent history is as detailed below  INTERVAL HISTORY:  Susan Wilson returns today for follow up of her estrogen receptor negative breast cancer. Since her last visit here she completed her radiation treatments. She generally did well. She had some fatigue and of course skin changes. These problems are slowly improving  REVIEW OF SYSTEMS: Dietra sleeps poorly. She describes her self is mildly to moderately fatigued. She still has pain in the surgical breast and arm but also in her back. This is not more intense or persistent than before. She has some sinus symptoms. Occasionally her ankles swell. She bruises easily. She has rare headaches. She still has numbness and tingling in her fingertips and toetips. She has decreased range of motion in the right shoulder area and she is undergoing physical therapy for this.A detailed review of systems today is otherwise noncontributory  PAST MEDICAL HISTORY: Past Medical History  Diagnosis Date  . Breast cancer of lower-inner quadrant of right female breast ([place]) 11/25/2014  . Breast cancer ([place])   . Hernia, umbilical   . Back pain   . Headache   . Anemia   . Hernia, umbilical   . Family history of breast cancer   . 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 Portacath placement Right 12/16/2014    Procedure: INSERTION PORT-A-CATH WITH ULTRASOUND;  Surgeon: Rolm Bookbinder, MD;  Location: [facility];  Service: General;  Laterality: Right;  . Radioactive seed guided mastectomy with axillary sentinel lymph node biopsy Right 06/15/2015    Procedure: RADIOACTIVE SEED GUIDED PARTIAL MASTECTOMY WITH  AXILLARY SENTINEL LYMPH NODE BIOPSY;  Surgeon: Rolm Bookbinder, MD;  Location: [facility];  Service: General;  Laterality: Right;  . Breast reconstruction Right 06/23/2015    Procedure: ONCOPLASTIC RIGHT BREAST REDUCTION;  Surgeon: Irene Limbo, MD;  Location: [facility];  Service: Plastics;  Laterality: Right;  . Breast reduction surgery Left 06/23/2015    Procedure: MAMMARY REDUCTION  (BREAST) LEFT BREAST FOR SYMETRY (BILATERAL BREAST REDUCTION);  Surgeon: Irene Limbo, MD;  Location: [facility];  Service: Plastics;  Laterality: Left;    FAMILY HISTORY Family History  Problem Relation Age of Onset  . Mesothelioma Maternal Grandfather     asbestos exposure, died in his 48s  . Heart defect Sister 0  . Heart disease Paternal Aunt   . Heart disease Paternal Uncle   . Breast cancer Paternal Grandmother   . Diabetes Paternal Grandmother   . Cancer Paternal Grandfather     NOS  . Colon cancer Other 13    MGMs brother with colon cancer  . Cancer Other     several of MGF's sisters with cancer NOS  . Liver cancer Other     MGF's brother   the patient's parents are living, her father being 72 and her mother 7 as of July 2016. The patient had no brothers. One sister died at age 42 from cardiac problems. The other sister is in good health. On the maternal side there is a history of colon cancer and an uncle age 74, liver cancer in a great uncle and mesothelioma in the maternal grandfather.  GYNECOLOGIC HISTORY:  No LMP recorded. Patient is not currently having periods (Reason: Chemotherapy). Menarche age 51. The patient is GX P2. She still having regular periods  SOCIAL HISTORY:  Laurinda works in Therapist, art for [employer]. Her husband Antrell works for [employer]. The daughters are [place] and Lovie Macadamia, age 57 and 85. The patient attends a local [employer]: Not in place   HEALTH  MAINTENANCE: Social History  Substance Use Topics  . Smoking status: Never Smoker   . Smokeless tobacco: Never Used  . Alcohol Use: Yes     Comment: rarely     Colonoscopy:  PAP: 2014  Bone density:  Lipid panel:  No Known Allergies  Current Outpatient Prescriptions  Medication Sig Dispense Refill  . b complex vitamins tablet Take 1 tablet by mouth daily.    . ferrous sulfate 325 (65 FE) MG tablet Take 325 mg by mouth daily with breakfast.    . ibuprofen (ADVIL,MOTRIN) 200 MG tablet Take 400 mg by mouth every 6 (six) hours as needed (for pain). Reported on 05/14/2015    . LORazepam (ATIVAN) 0.5 MG tablet Take 1 tablet (0.5 mg total) by mouth at bedtime. 30 tablet 0  . prochlorperazine (COMPAZINE) 10 MG tablet Take 1 tablet (10 mg total) by mouth every 6 (six) hours as needed for nausea or vomiting. 30 tablet 1  . SUMAtriptan (IMITREX) 50 MG tablet Take 1 tablet (50 mg total) by mouth every 2 (two) hours as needed for migraine. May repeat in 2 hours if headache persists or recurs. 10 tablet 0   No current facility-administered medications for this visit.    OBJECTIVE: Young African-American woman Who appears stated  age  29 Vitals:   12/03/15 1151  BP: 137/98  Pulse: 88  Temp: 98.7 F (37.1 C)  Resp: 18     Body mass index is 38.97 kg/(m^2).    ECOG FS:1 - Symptomatic but completely ambulatory  Sclerae unicteric, EOMs intact Oropharynx clear and moist No cervical or supraclavicular adenopathy Lungs no rales or rhonchi Heart regular rate and rhythm Abd soft, nontender, positive bowel sounds MSK no focal spinal tenderness, no upper extremity lymphedema Neuro: nonfocal, well oriented, appropriate affect Breasts: The right breast is status post reduction mammoplasty as well as lumpectomy and radiation. The hyperpigmentation is slowly clearing. There is no evidence of local recurrence. The left axilla is benign. The left breast is also status post reduction mammoplasty. It  is otherwise unremarkable.     LAB RESULTS:  CMP  CBC Latest Ref Rng 12/03/2015 09/16/2015 07/27/2015  WBC 3.9 - 10.3 10e3/uL 3.6(L) 3.3(L) 5.2  Hemoglobin 11.6 - 15.9 g/dL 11.9 11.6 10.5(L)  Hematocrit 34.8 - 46.6 % 37.1 38.3 34.7(L)  Platelets 145 - 400 10e3/uL 225 220 260   CMP Latest Ref Rng 12/03/2015 09/16/2015 07/27/2015  Glucose 70 - 140 mg/dl 100 100 93  BUN 7.0 - 26.0 mg/dL 12.0 10.3 13.7  Creatinine 0.6 - 1.1 mg/dL 0.8 0.8 0.8  Sodium 136 - 145 mEq/L 138 138 138  Potassium 3.5 - 5.1 mEq/L 4.1 3.8 3.9  CO2 22 - 29 mEq/L 26 27 27   Calcium 8.4 - 10.4 mg/dL 9.4 9.1 9.0  Total Protein 6.4 - 8.3 g/dL 7.8 7.3 7.1  Total Bilirubin 0.20 - 1.20 mg/dL 0.81 0.60 0.67  Alkaline Phos 40 - 150 U/L 79 73 70  AST 5 - 34 U/L 21 16 17   ALT 0 - 55 U/L 26 23 22       Urinalysis    Component Value Date/Time   LABSPEC 1.020 05/06/2009 1439   PHURINE 7.0 05/06/2009 1439   GLUCOSEU NEGATIVE 05/06/2009 1439   HGBUR NEGATIVE 05/06/2009 1439   BILIRUBINUR NEGATIVE 05/06/2009 1439   KETONESUR NEGATIVE 05/06/2009 1439   PROTEINUR NEGATIVE 05/06/2009 1439   UROBILINOGEN 0.2 05/06/2009 1439   NITRITE NEGATIVE 05/06/2009 1439   LEUKOCYTESUR  05/06/2009 1439    NEGATIVE Biochemical Testing Only. Please order routine urinalysis from main lab if confirmatory testing is needed.    STUDIES: No results found.  ASSESSMENT: 40 y.o. BRCA negative [place] woman s/p Right breast biopsy Lower inner quadrant 11/21/2014 for a clinical T2 NX, stage 2 invasive ductal carcinoma, grade 2 or 3, estrogen and progesterone receptor negative, HER-2 equivocal, with an Mib-1 of 20%  (a) biopsy of a suspicious axillary lymph node same day was negative, but discordant  (b) review of 80 additional tumor cells by FISH still showed HER-2 not amplified; tumor should be treated as a triple negative  (1) neoadjuvant chemotherapy started 01/01/2015 consisting of cyclophosphamide and doxorubicin in dose dense fashion 4,  completed 02/13/2015, followed by paclitaxel/ carboplatin weekly 12 started 02/26/2015, completed 05/14/2015.   (2) genetics testing 12/15/2014 through the Breast/Ovarian gene panel offered by [facility] found no deleterious mutations in ATM, BARD1, BRCA1, BRCA2, BRIP1, CDH1, CHEK2, EPCAM, FANCC, MLH1, MSH2, MSH6, NBN, PALB2, PMS2, PTEN, RAD51C, RAD51D, TP53, and XRCC2  (3) right lumpectomy and sentinel lymph node sampling 06/15/2015 showed a residual pT1b pN0 invasive ductal carcinoma, grade 1, with negative margins. (There were actually a few microscopic nests of residual tumor and 0.9 is the longest distance between 2 of the microscopic nests--there was not  enough tissue for repeat prognostic panel.)  (a) bilateral reduction mammoplasty 06/23/2015 showed no malignancy in either breast  (4) adjuvant radiation 09/01/2015-10/19/2015:  Right breast / 45 Gray @ 1.8 Pearline Cables per fraction x 25 fractions Right breast boost / 16 Gray at [employer] per fraction x 8 fractions   (5) the patient's menses stopped during chemotherapy and have not resumed   PLAN: Nikya has completed her radiation treatments. She is scheduled for further plastic reconstruction September 1 and she will have her port removed at that time.  I think she would benefit from a regular exercise program particularly something like tai chi. I gave her information on what we offer here and [facility] [place] [facility]  She would also benefit from the finding normal program.  I am going to see her again in December, and assuming all is going well we will initiate long-term follow-up at that point.  Chauncey Cruel, MD   12/03/2015 6:27 PM

## 2015-12-03 NOTE — Therapy (Signed)
[place], [place], [address] Phone: [phone]   Fax:  [phone]  Physical Therapy Treatment  Patient Details  Name: Susan Wilson MRN: [id number] Date of Birth: [birth year] Referring Provider: Dr. Donne Hazel   Encounter Date: 12/03/2015      PT End of Session - 12/03/15 1021    Visit Number 5   Number of Visits 13   Date for PT Re-Evaluation 12/30/15   PT Start Time 0936   PT Stop Time 1020   PT Time Calculation (min) 44 min   Activity Tolerance Patient tolerated treatment well   Behavior During Therapy [facility] for tasks assessed/performed      Past Medical History  Diagnosis Date  . Breast cancer of lower-inner quadrant of right female breast ([place]) 11/25/2014  . Breast cancer ([place])   . Hernia, umbilical   . Back pain   . Headache   . Anemia   . Hernia, umbilical   . Family history of breast cancer   . Anemia in neoplastic disease 04/02/2015    Past Surgical History  Procedure Laterality Date  . Portacath placement Right 12/16/2014    Procedure: INSERTION PORT-A-CATH WITH ULTRASOUND;  Surgeon: Rolm Bookbinder, MD;  Location: [facility];  Service: General;  Laterality: Right;  . Radioactive seed guided mastectomy with axillary sentinel lymph node biopsy Right 06/15/2015    Procedure: RADIOACTIVE SEED GUIDED PARTIAL MASTECTOMY WITH AXILLARY SENTINEL LYMPH NODE BIOPSY;  Surgeon: Rolm Bookbinder, MD;  Location: [facility];  Service: General;  Laterality: Right;  . Breast reconstruction Right 06/23/2015    Procedure: ONCOPLASTIC RIGHT BREAST REDUCTION;  Surgeon: Irene Limbo, MD;  Location: [facility];  Service: Plastics;  Laterality: Right;  . Breast reduction surgery Left 06/23/2015    Procedure: MAMMARY REDUCTION  (BREAST) LEFT BREAST FOR SYMETRY (BILATERAL BREAST REDUCTION);  Surgeon: Irene Limbo, MD;  Location: [facility];  Service:  Plastics;  Laterality: Left;    There were no vitals filed for this visit.      Subjective Assessment - 12/03/15 0939    Subjective I was really sore after last visit, I think we stretched too much. Still sore, but it's gotten a little better.    Pertinent History right lumpectomy with 6 nodes removed end of january 2017,and had breast reduction on both sides, she will have another on the left side schedules for september.  had chemotherapy before surgery and completed radiation June 5,2017    Patient Stated Goals to be able to move my arm around (rotate ) with less pain    Currently in Pain? No/denies            [facility] PT Assessment - 12/03/15 0001    AROM   Right Shoulder Flexion 132 Degrees   Right Shoulder ABduction 110 Degrees  Pt reports still feeling pretty sore                     [facility] Adult PT Treatment/Exercise - 12/03/15 0001    Shoulder Exercises: Supine   Horizontal ABduction Strengthening;Both;10 reps;Theraband   Theraband Level (Shoulder Horizontal ABduction) Level 1 (Yellow)   External Rotation Strengthening;Both;5 reps;Theraband   Theraband Level (Shoulder External Rotation) Level 1 (Yellow)   Flexion Strengthening;Both;5 reps;Theraband  Narrow and wide grip 5 times each   Theraband Level (Shoulder Flexion) Level 1 (Yellow)   Other Supine Exercises Bil D2 with yellow theraband and 5 times each side. Pt returned correct  hterapist demo.    Shoulder Exercises: Pulleys   Flexion 2 minutes   ABduction 2 minutes   Shoulder Exercises: Therapy Ball   Flexion 5 reps  Forward lean into top of stretch   ABduction 5 reps  Rt UE with side lean into top of stretch   Manual Therapy   Manual Lymphatic Drainage (MLD) in supine, short neck, superficial and deep abdominals, right inguinal nodes, right axillo-inguinal anastomosis, left axillary nodes, anterior interaxillay anastaosis avoiding port on right chest, right anterior and posterior shoulder upper arm  forearm and hand and returning along pathways reviewing with pt thoughout.   Passive ROM In Supine to Rt shoulder into flexion, abduction and er all gently and to pts tolerance.                PT Education - 12/03/15 1021    Education provided Yes   Education Details Supine sapular series with yellow theraband   Person(s) Educated Patient   Methods Explanation;Demonstration;Handout;Verbal cues   Comprehension Verbalized understanding;Returned demonstration           Short Term Clinic Goals - 12/03/15 1211    CC Short Term Goal  #1   Title Patient with verbalize an understanding of lymphedema risk reduction precautions   Status On-going   CC Short Term Goal  #2   Title Patient will be independent in a basic  home exercise program   Status On-going   CC Short Term Goal  #3   Title Patient will report a decrease in pain by 50% so they can perform daily activities with greater ease   Status On-going             Long Term Clinic Goals - 12/03/15 1211    CC Long Term Goal  #1   Title Right shoulder active flexion to at least 145 degrees for improved overhead reach.   Baseline 120 on 11/18/2015, 133 on 11/26/2015, 132 degrees 12/03/15   Status On-going   CC Long Term Goal  #2   Title Right shoulder external rotation to 70 degrees. to allow for better shoulder abduction    Status On-going   CC Long Term Goal  #3   Title Left shoulder abduction to at least 150 degrees for improved ADLs.   Baseline 100 on 11/18/2015, 135 on 11/26/2015, 110 degrees 12/03/15   Status On-going   CC Long Term Goal  #4   Title Patient will decrease the DASH score to < 30   to demonstrate increased functional use of upper extremity   Status On-going   CC Long Term Goal  #5   Title Patient will report a decrease in pain by 75% so they can perform daily activities with greater ease   Status On-going            Plan - 12/03/15 1022    Clinical Impression Statement Instructed pt in supine  scapular series with yellow theraband which she seemed to tolerate well. Also did well with review of self MLD. Pts flexion decreased from last week but she reports feeling sore from last session affecting this.    Rehab Potential Good   Clinical Impairments Affecting Rehab Potential previous chemo and right upper quadrant range ot motiion   PT Frequency 2x / week   PT Duration 6 weeks   PT Treatment/Interventions ADLs/Self Care Home Management;Manual lymph drainage;Compression bandaging;Scar mobilization;Passive range of motion;Patient/family education;DME Instruction;Functional mobility training;Electrical Stimulation;Therapeutic activities;Therapeutic exercise;Manual techniques;Taping   PT Next Visit Plan Begin with  lymphatic flow yoga, do brief manual lymph drainage to right upper quadrant, perform shoulder PROM complete instruction in supine scapular exercise with yellow theraband  Remeasure shoulder range of motion  coninue with myofacscila work to right posteior axilla    Consulted and Agree with Plan of Care Patient      Patient will benefit from skilled therapeutic intervention in order to improve the following deficits and impairments:  Decreased range of motion, Increased fascial restricitons, Impaired UE functional use, Decreased activity tolerance, Decreased knowledge of precautions, Impaired perceived functional ability, Pain, Decreased knowledge of use of DME, Decreased strength, Increased edema, Postural dysfunction  Visit Diagnosis: Stiffness of right shoulder, not elsewhere classified  Pain in right shoulder  Localized swelling, mass and lump, right upper limb  Abnormal posture  Muscle weakness (generalized)     Problem List Patient Active Problem List   Diagnosis Date Noted  . Chemotherapy induced neutropenia ([place]) 05/14/2015  . 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 Tooth ache 01/09/2015  . Genetic testing  12/26/2014  . Family history of breast cancer   . Breast cancer of lower-inner quadrant of right female breast ([place]) 11/25/2014  . Migraine 11/19/2014    Otelia Limes, PTA 12/03/2015, 12:17 PM  [place], [place], [address] Phone: [phone]   Fax:  [phone]  Name: Susan Wilson MRN: [id number] Date of Birth: March 01, 1976

## 2015-12-03 NOTE — Telephone Encounter (Signed)
appt made and avs printed °

## 2015-12-05 ENCOUNTER — Telehealth: Payer: Self-pay | Admitting: Oncology

## 2015-12-05 NOTE — Telephone Encounter (Signed)
Lvm advising appt 9/25 @ 9.30am flush and surviviorship clinic at 10.30am.

## 2015-12-08 ENCOUNTER — Ambulatory Visit: Payer: 59

## 2015-12-08 DIAGNOSIS — R293 Abnormal posture: Secondary | ICD-10-CM

## 2015-12-08 DIAGNOSIS — M25611 Stiffness of right shoulder, not elsewhere classified: Secondary | ICD-10-CM | POA: Diagnosis not present

## 2015-12-08 DIAGNOSIS — R2231 Localized swelling, mass and lump, right upper limb: Secondary | ICD-10-CM

## 2015-12-08 DIAGNOSIS — M25511 Pain in right shoulder: Secondary | ICD-10-CM

## 2015-12-08 DIAGNOSIS — M6281 Muscle weakness (generalized): Secondary | ICD-10-CM

## 2015-12-08 NOTE — Therapy (Signed)
[name], [place], [address] Phone: [phone]   Fax:  [phone]  Physical Therapy Treatment  Patient Details  Name: Susan Wilson MRN: [id number] Date of Birth: 28-May-1975 Referring Provider: Dr. Donne Hazel   Encounter Date: 12/08/2015      PT End of Session - 12/08/15 1107    Visit Number 6   Number of Visits 13   Date for PT Re-Evaluation 12/30/15   PT Start Time 1022   PT Stop Time 1106   PT Time Calculation (min) 44 min   Activity Tolerance Patient tolerated treatment well   Behavior During Therapy [facility] for tasks assessed/performed      Past Medical History:  Diagnosis Date  . Anemia   . 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 Back pain   . Breast cancer ([place])   . Breast cancer of lower-inner quadrant of right female breast ([place]) 11/25/2014  . Family history of breast cancer   . Headache   . Hernia, umbilical   . Hernia, umbilical     Past Surgical History:  Procedure Laterality Date  . BREAST RECONSTRUCTION Right 06/23/2015   Procedure: ONCOPLASTIC RIGHT BREAST REDUCTION;  Surgeon: Irene Limbo, MD;  Location: [place];  Service: Plastics;  Laterality: Right;  . BREAST REDUCTION SURGERY Left 06/23/2015   Procedure: MAMMARY REDUCTION  (BREAST) LEFT BREAST FOR SYMETRY (BILATERAL BREAST REDUCTION);  Surgeon: Irene Limbo, MD;  Location: [place];  Service: Plastics;  Laterality: Left;  . PORTACATH PLACEMENT Right 12/16/2014   Procedure: INSERTION PORT-A-CATH WITH ULTRASOUND;  Surgeon: Rolm Bookbinder, MD;  Location: [place];  Service: General;  Laterality: Right;  . RADIOACTIVE SEED GUIDED MASTECTOMY WITH AXILLARY SENTINEL LYMPH NODE BIOPSY Right 06/15/2015   Procedure: RADIOACTIVE SEED GUIDED PARTIAL MASTECTOMY WITH AXILLARY SENTINEL LYMPH NODE BIOPSY;  Surgeon: Rolm Bookbinder, MD;  Location: [place];  Service:  General;  Laterality: Right;    There were no vitals filed for this visit.      Subjective Assessment - 12/08/15 1024    Subjective My flexibility is slowly getting better, been doing my own pulleys at home about every othre day. Reaching into backseat to hand my son something in car has gotten a little better.    Pertinent History right lumpectomy with 6 nodes removed end of january 2017,and had breast reduction on both sides, she will have another on the left side schedules for september.  had chemotherapy before surgery and completed radiation June 5,2017    Patient Stated Goals to be able to move my arm around (rotate ) with less pain    Currently in Pain? No/denies            [facility] PT Assessment - 12/08/15 0001      AROM   Right Shoulder Flexion 136 Degrees   Right Shoulder ABduction 116 Degrees  With tactile cuing to decrease scapular compensations   Right Shoulder Internal Rotation 43 Degrees  With difficulty decreasing scapular compensations                     [facility] Adult PT Treatment/Exercise - 12/08/15 0001      Shoulder Exercises: Standing   Other Standing Exercises Standing 3 way raises with 2 lbs into flexion, scaption, and abduction (with 1 lb for abd) to 90 degrees 10 times each     Shoulder Exercises: Pulleys   Flexion 2 minutes   ABduction 2 minutes  Shoulder Exercises: Therapy Ball   Flexion 10 reps  Forward lean into top of stretch   ABduction 10 reps  Rt UE with side lean into top of stretch     Manual Therapy   Manual Lymphatic Drainage (MLD) in supine, short neck, superficial and deep abdominals, right inguinal nodes, right axillo-inguinal anastomosis, left axillary nodes, anterior interaxillay anastamosis avoiding port on right chest, right anterior and posterior shoulder upper arm forearm and hand and returning along pathways reviewing with pt thoughout.   Passive ROM In Supine to Rt shoulder into flexion, abduction and er all  gently and to pts tolerance.                PT Education - 12/08/15 1040    Education provided Yes   Education Details Lymphedema risk reduction and infection prevention   Person(s) Educated Patient   Methods Explanation;Handout   Comprehension Verbalized understanding           Short Term Clinic Goals - 12/08/15 1037      CC Short Term Goal  #1   Title Patient with verbalize an understanding of lymphedema risk reduction precautions   Status Achieved     CC Short Term Goal  #2   Title Patient will be independent in a basic  home exercise program   Status Achieved     CC Short Term Goal  #3   Title Patient will report a decrease in pain by 50% so they can perform daily activities with greater ease  Pt reports 40% at this time. 12/08/15   Status Partially Met             Long Term Clinic Goals - 12/08/15 1040      CC Long Term Goal  #1   Title Right shoulder active flexion to at least 145 degrees for improved overhead reach.   Baseline 120 on 11/18/2015, 133 on 11/26/2015, 132 degrees 12/03/15; 136 degrees 12/08/15   Status On-going     CC Long Term Goal  #2   Title Right shoulder external rotation to 70 degrees. to allow for better shoulder abduction    Baseline 55 on 11/18/2015; 43 degrees 12/08/15   Status On-going     CC Long Term Goal  #3   Title Left shoulder abduction to at least 150 degrees for improved ADLs.   Baseline 100 on 11/18/2015, 135 on 11/26/2015, 110 degrees 12/03/15; 116 degrees 12/08/15   Status On-going     CC Long Term Goal  #4   Title Patient will decrease the DASH score to < 30   to demonstrate increased functional use of upper extremity   Status On-going     CC Long Term Goal  #5   Title Patient will report a decrease in pain by 75% so they can perform daily activities with greater ease   Baseline 40% improvement reported 12/08/15   Status On-going            Plan - 12/08/15 1107    Clinical Impression Statement Pt still  struggles with decreasing scapular compensations with abduction and er though her A/ROM measurements have improved since last week showing progress towards goals. She also notes her pain has improved by 40% progressing towards that goal as well.    Rehab Potential Good   Clinical Impairments Affecting Rehab Potential previous chemo and right upper quadrant range ot motiion   PT Frequency 2x / week   PT Duration 6 weeks   PT Treatment/Interventions  ADLs/Self Care Home Management;Manual lymph drainage;Compression bandaging;Scar mobilization;Passive range of motion;Patient/family education;DME Instruction;Functional mobility training;Electrical Stimulation;Therapeutic activities;Therapeutic exercise;Manual techniques;Taping   PT Next Visit Plan Begin with lymphatic flow yoga, do brief manual lymph drainage to right upper quadrant, perform shoulder PROM; coninue with myofacscila work to right posteior axilla as tolerated   Consulted and Agree with Plan of Care Patient      Patient will benefit from skilled therapeutic intervention in order to improve the following deficits and impairments:  Decreased range of motion, Increased fascial restricitons, Impaired UE functional use, Decreased activity tolerance, Decreased knowledge of precautions, Impaired perceived functional ability, Pain, Decreased knowledge of use of DME, Decreased strength, Increased edema, Postural dysfunction  Visit Diagnosis: Stiffness of right shoulder, not elsewhere classified  Pain in right shoulder  Localized swelling, mass and lump, right upper limb  Abnormal posture  Muscle weakness (generalized)     Problem List Patient Active Problem List   Diagnosis Date Noted  . Chemotherapy induced neutropenia ([place]) 05/14/2015  . 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 Tooth ache 01/09/2015  . Genetic testing 12/26/2014  . Family history of breast cancer   . Breast  cancer of lower-inner quadrant of right female breast ([place]) 11/25/2014  . Migraine 11/19/2014    Otelia Limes, PTA 12/08/2015, 11:10 AM  [place], [place], [address] Phone: [phone]   Fax:  [phone]  Name: CORNELIOUS DIVEN MRN: [id number] Date of Birth: 19-Dec-1975

## 2015-12-10 ENCOUNTER — Ambulatory Visit: Payer: 59

## 2015-12-10 DIAGNOSIS — R293 Abnormal posture: Secondary | ICD-10-CM

## 2015-12-10 DIAGNOSIS — M25612 Stiffness of left shoulder, not elsewhere classified: Secondary | ICD-10-CM

## 2015-12-10 DIAGNOSIS — M25611 Stiffness of right shoulder, not elsewhere classified: Secondary | ICD-10-CM

## 2015-12-10 DIAGNOSIS — R2231 Localized swelling, mass and lump, right upper limb: Secondary | ICD-10-CM

## 2015-12-10 DIAGNOSIS — Z9189 Other specified personal risk factors, not elsewhere classified: Secondary | ICD-10-CM

## 2015-12-10 DIAGNOSIS — R6889 Other general symptoms and signs: Secondary | ICD-10-CM

## 2015-12-10 DIAGNOSIS — M6281 Muscle weakness (generalized): Secondary | ICD-10-CM

## 2015-12-10 DIAGNOSIS — M25511 Pain in right shoulder: Secondary | ICD-10-CM

## 2015-12-10 NOTE — Therapy (Signed)
[place], [place], [address] Phone: [phone]   Fax:  [phone]  Physical Therapy Treatment  Patient Details  Name: Susan Wilson MRN: [id number] Date of Birth: 12-Jul-1975 Referring Provider: Dr. Donne Hazel   Encounter Date: 12/10/2015      PT End of Session - 12/10/15 1103    Visit Number 7   Number of Visits 13   Date for PT Re-Evaluation 12/30/15   PT Start Time 1022   PT Stop Time 1103   PT Time Calculation (min) 41 min   Activity Tolerance Patient tolerated treatment well   Behavior During Therapy [facility] for tasks assessed/performed      Past Medical History:  Diagnosis Date  . Anemia   . 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 Back pain   . Breast cancer ([place])   . Breast cancer of lower-inner quadrant of right female breast ([place]) 11/25/2014  . Family history of breast cancer   . Headache   . Hernia, umbilical   . Hernia, umbilical     Past Surgical History:  Procedure Laterality Date  . BREAST RECONSTRUCTION Right 06/23/2015   Procedure: ONCOPLASTIC RIGHT BREAST REDUCTION;  Surgeon: Irene Limbo, MD;  Location: [place];  Service: Plastics;  Laterality: Right;  . BREAST REDUCTION SURGERY Left 06/23/2015   Procedure: MAMMARY REDUCTION  (BREAST) LEFT BREAST FOR SYMETRY (BILATERAL BREAST REDUCTION);  Surgeon: Irene Limbo, MD;  Location: [place];  Service: Plastics;  Laterality: Left;  . PORTACATH PLACEMENT Right 12/16/2014   Procedure: INSERTION PORT-A-CATH WITH ULTRASOUND;  Surgeon: Rolm Bookbinder, MD;  Location: [place];  Service: General;  Laterality: Right;  . RADIOACTIVE SEED GUIDED MASTECTOMY WITH AXILLARY SENTINEL LYMPH NODE BIOPSY Right 06/15/2015   Procedure: RADIOACTIVE SEED GUIDED PARTIAL MASTECTOMY WITH AXILLARY SENTINEL LYMPH NODE BIOPSY;  Surgeon: Rolm Bookbinder, MD;  Location: [place];  Service:  General;  Laterality: Right;    There were no vitals filed for this visit.      Subjective Assessment - 12/10/15 1024    Subjective Just a little sore after last visit, nothing unreasonable.    Pertinent History right lumpectomy with 6 nodes removed end of january 2017,and had breast reduction on both sides, she will have another on the left side schedules for september.  had chemotherapy before surgery and completed radiation June 5,2017    Patient Stated Goals to be able to move my arm around (rotate ) with less pain    Currently in Pain? No/denies                         [facility] Adult PT Treatment/Exercise - 12/10/15 0001      Shoulder Exercises: Standing   Other Standing Exercises Standing 3 way raises with 1 lb into flexion, scaption, and abduction to 90 degrees 10 times each   Other Standing Exercises Wall pushups x 10      Shoulder Exercises: Pulleys   Flexion 2 minutes   ABduction 2 minutes     Shoulder Exercises: Therapy Ball   Flexion 10 reps  Leaning into top of stretch   ABduction 10 reps  Rt UE with side lean into top of stretch     Manual Therapy   Manual Lymphatic Drainage (MLD) In supine, short neck, superficial and deep abdominals, right inguinal nodes, right axillo-inguinal anastomosis, left axillary nodes, anterior interaxillay anastamosis avoiding port on right chest, right anterior and  posterior shoulder upper arm forearm and hand and returning along pathways reviewing with pt thoughout.   Passive ROM In Supine to Rt shoulder into flexion, abduction and er all gently and to pts tolerance.                   Short Term Clinic Goals - 12/08/15 1037      CC Short Term Goal  #1   Title Patient with verbalize an understanding of lymphedema risk reduction precautions   Status Achieved     CC Short Term Goal  #2   Title Patient will be independent in a basic  home exercise program   Status Achieved     CC Short Term Goal  #3    Title Patient will report a decrease in pain by 50% so they can perform daily activities with greater ease  Pt reports 40% at this time. 12/08/15   Status Partially Met             Long Term Clinic Goals - 12/08/15 1040      CC Long Term Goal  #1   Title Right shoulder active flexion to at least 145 degrees for improved overhead reach.   Baseline 120 on 11/18/2015, 133 on 11/26/2015, 132 degrees 12/03/15; 136 degrees 12/08/15   Status On-going     CC Long Term Goal  #2   Title Right shoulder external rotation to 70 degrees. to allow for better shoulder abduction    Baseline 55 on 11/18/2015; 43 degrees 12/08/15   Status On-going     CC Long Term Goal  #3   Title Left shoulder abduction to at least 150 degrees for improved ADLs.   Baseline 100 on 11/18/2015, 135 on 11/26/2015, 110 degrees 12/03/15; 116 degrees 12/08/15   Status On-going     CC Long Term Goal  #4   Title Patient will decrease the DASH score to < 30   to demonstrate increased functional use of upper extremity   Status On-going     CC Long Term Goal  #5   Title Patient will report a decrease in pain by 75% so they can perform daily activities with greater ease   Baseline 40% improvement reported 12/08/15   Status On-going            Plan - 12/10/15 1103    Clinical Impression Statement Pt did some better today with decreasing scapular compensations with pulleys into abduction today but overall is still very tight with abduction motions. She did well with new exercises of wall push ups but decreased weights to 1 lb as pt reported her arms were feeling a little weak today. Overall pt continues progressing towards goals.   Clinical Impairments Affecting Rehab Potential previous chemo and right upper quadrant range ot motiion   PT Frequency 2x / week   PT Duration 6 weeks   PT Treatment/Interventions ADLs/Self Care Home Management;Manual lymph drainage;Compression bandaging;Scar mobilization;Passive range of  motion;Patient/family education;DME Instruction;Functional mobility training;Electrical Stimulation;Therapeutic activities;Therapeutic exercise;Manual techniques;Taping   PT Next Visit Plan Issue lymphatic flow yoga, do brief manual lymph drainage to right upper quadrant, perform shoulder PROM; coninue with myofacscila work to right posteior axilla as tolerated   Consulted and Agree with Plan of Care Patient      Patient will benefit from skilled therapeutic intervention in order to improve the following deficits and impairments:  Decreased range of motion, Increased fascial restricitons, Impaired UE functional use, Decreased activity tolerance, Decreased knowledge of precautions, Impaired  perceived functional ability, Pain, Decreased knowledge of use of DME, Decreased strength, Increased edema, Postural dysfunction  Visit Diagnosis: Stiffness of right shoulder, not elsewhere classified  Pain in right shoulder  Localized swelling, mass and lump, right upper limb  Abnormal posture  Muscle weakness (generalized)  Decreased ROM of right shoulder  Decreased ROM of left shoulder  At risk for lymphedema  Impaired function of upper extremity     Problem List Patient Active Problem List   Diagnosis Date Noted  . Chemotherapy induced neutropenia ([place]) 05/14/2015  . Chemotherapy-induced neuropathy ([place]) 04/08/2015  . 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 Tooth ache 01/09/2015  . Genetic testing 12/26/2014  . Family history of breast cancer   . Breast cancer of lower-inner quadrant of right female breast ([place]) 11/25/2014  . Migraine 11/19/2014    Otelia Limes, PTA 12/10/2015, 11:09 AM  [place], [place], [address] Phone: [phone]   Fax:  [phone]  Name: Susan Wilson MRN: [id number] Date of Birth: 06/07/1975

## 2015-12-15 ENCOUNTER — Ambulatory Visit: Payer: 59 | Attending: General Surgery | Admitting: Physical Therapy

## 2015-12-15 DIAGNOSIS — M25611 Stiffness of right shoulder, not elsewhere classified: Secondary | ICD-10-CM | POA: Insufficient documentation

## 2015-12-15 DIAGNOSIS — R293 Abnormal posture: Secondary | ICD-10-CM | POA: Diagnosis present

## 2015-12-15 DIAGNOSIS — N6489 Other specified disorders of breast: Secondary | ICD-10-CM

## 2015-12-15 DIAGNOSIS — M6281 Muscle weakness (generalized): Secondary | ICD-10-CM | POA: Diagnosis present

## 2015-12-15 DIAGNOSIS — R2231 Localized swelling, mass and lump, right upper limb: Secondary | ICD-10-CM | POA: Insufficient documentation

## 2015-12-15 DIAGNOSIS — M25511 Pain in right shoulder: Secondary | ICD-10-CM

## 2015-12-15 HISTORY — DX: Other specified disorders of breast: N64.89

## 2015-12-15 NOTE — Therapy (Signed)
[place], [place], [address] Phone: [phone]   Fax:  [phone]  Physical Therapy Treatment  Patient Details  Name: Susan Wilson MRN: [id number] Date of Birth: [birth year] Referring Provider: Dr. Donne Hazel   Encounter Date: 12/15/2015      PT End of Session - 12/15/15 1126    Visit Number 8   Number of Visits 13   Date for PT Re-Evaluation 12/30/15   PT Start Time 1017   PT Stop Time 1105   PT Time Calculation (min) 48 min   Activity Tolerance Patient tolerated treatment well   Behavior During Therapy [facility] for tasks assessed/performed      Past Medical History:  Diagnosis Date  . Anemia   . 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 Back pain   . Breast cancer ([place])   . Breast cancer of lower-inner quadrant of right female breast ([place]) 11/25/2014  . Family history of breast cancer   . Headache   . Hernia, umbilical   . Hernia, umbilical     Past Surgical History:  Procedure Laterality Date  . BREAST RECONSTRUCTION Right 06/23/2015   Procedure: ONCOPLASTIC RIGHT BREAST REDUCTION;  Surgeon: Irene Limbo, MD;  Location: [place];  Service: Plastics;  Laterality: Right;  . BREAST REDUCTION SURGERY Left 06/23/2015   Procedure: MAMMARY REDUCTION  (BREAST) LEFT BREAST FOR SYMETRY (BILATERAL BREAST REDUCTION);  Surgeon: Irene Limbo, MD;  Location: [place];  Service: Plastics;  Laterality: Left;  . PORTACATH PLACEMENT Right 12/16/2014   Procedure: INSERTION PORT-A-CATH WITH ULTRASOUND;  Surgeon: Rolm Bookbinder, MD;  Location: [place];  Service: General;  Laterality: Right;  . RADIOACTIVE SEED GUIDED MASTECTOMY WITH AXILLARY SENTINEL LYMPH NODE BIOPSY Right 06/15/2015   Procedure: RADIOACTIVE SEED GUIDED PARTIAL MASTECTOMY WITH AXILLARY SENTINEL LYMPH NODE BIOPSY;  Surgeon: Rolm Bookbinder, MD;  Location: [place];  Service:  General;  Laterality: Right;    There were no vitals filed for this visit.      Subjective Assessment - 12/15/15 1022    Subjective still achy especially when it storms    Pertinent History right lumpectomy with 6 nodes removed end of january 2017,and had breast reduction on both sides, she will have another on the left side schedules for september.  had chemotherapy before surgery and completed radiation June 5,2017    Patient Stated Goals to be able to move my arm around (rotate ) with less pain    Currently in Pain? Yes   Pain Score 1    Pain Location Shoulder  top of shoulder    Pain Orientation Right   Pain Descriptors / Indicators Aching   Pain Type Chronic pain                         [facility] Adult PT Treatment/Exercise - 12/15/15 0001      Self-Care   Other Self-Care Comments  issued youtube link for lymphatic flow series proviced tg soft to right arm for support and pain control      Neck Exercises: Seated   Other Seated Exercise one rep of lymphatic flow series  with caution about stretching port    Other Seated Exercise isolated right scapular retraction and depression      Shoulder Exercises: Supine   Protraction AROM;Right;10 reps;Weights   Protraction Weight (lbs) 2   Other Supine Exercises elbow flexion x 10 reps with 3#  Shoulder Exercises: Standing   External Rotation Strengthening;Right;10 reps;Theraband   Theraband Level (Shoulder External Rotation) Level 1 (Yellow)   Internal Rotation Strengthening;Right;10 reps;Theraband   Theraband Level (Shoulder Internal Rotation) Level 1 (Yellow)   Flexion Strengthening;Right;10 reps;Theraband   Theraband Level (Shoulder Flexion) Level 1 (Yellow)   Extension Strengthening;Right;10 reps;Theraband   Theraband Level (Shoulder Extension) Level 1 (Yellow)   Retraction AAROM;Both;10 reps;Theraband   Theraband Level (Shoulder Retraction) Level 1 (Yellow)     Shoulder Exercises: Pulleys   Flexion 2  minutes   ABduction 2 minutes     Manual Therapy   Myofascial Release to trigger point at posterior axilla with active release and prolonged pressure. This caused increased achiness for paient so was not continued,  myfascial pulling of right arm in supine for joint distraction and myfascial release    Passive ROM In Supine to Rt shoulder into flexion, abduction and er all gently and to pts tolerance.                PT Education - 12/15/15 1125    Education provided Yes   Education Details youtube link for lymphatic flow yoga series    Person(s) Educated Patient   Methods Handout   Comprehension Verbalized understanding           Short Term Clinic Goals - 12/08/15 1037      CC Short Term Goal  #1   Title Patient with verbalize an understanding of lymphedema risk reduction precautions   Status Achieved     CC Short Term Goal  #2   Title Patient will be independent in a basic  home exercise program   Status Achieved     CC Short Term Goal  #3   Title Patient will report a decrease in pain by 50% so they can perform daily activities with greater ease  Pt reports 40% at this time. 12/08/15   Status Partially Met             Long Term Clinic Goals - 12/08/15 1040      CC Long Term Goal  #1   Title Right shoulder active flexion to at least 145 degrees for improved overhead reach.   Baseline 120 on 11/18/2015, 133 on 11/26/2015, 132 degrees 12/03/15; 136 degrees 12/08/15   Status On-going     CC Long Term Goal  #2   Title Right shoulder external rotation to 70 degrees. to allow for better shoulder abduction    Baseline 55 on 11/18/2015; 43 degrees 12/08/15   Status On-going     CC Long Term Goal  #3   Title Left shoulder abduction to at least 150 degrees for improved ADLs.   Baseline 100 on 11/18/2015, 135 on 11/26/2015, 110 degrees 12/03/15; 116 degrees 12/08/15   Status On-going     CC Long Term Goal  #4   Title Patient will decrease the DASH score to < 30   to  demonstrate increased functional use of upper extremity   Status On-going     CC Long Term Goal  #5   Title Patient will report a decrease in pain by 75% so they can perform daily activities with greater ease   Baseline 40% improvement reported 12/08/15   Status On-going            Plan - 12/15/15 1127    Clinical Impression Statement Pt is doing well with scapular strenthening but still has myofascial tightness in right posterior axilla with c/o throbbing down arm  with pressure to this area. Upgraded home exercise to [place] series    Rehab Potential Good   Clinical Impairments Affecting Rehab Potential previous chemo and right upper quadrant radiation    PT Frequency 2x / week   PT Duration 6 weeks   PT Treatment/Interventions ADLs/Self Care Home Management;Manual lymph drainage;Compression bandaging;Scar mobilization;Passive range of motion;Patient/family education;DME Instruction;Functional mobility training;Electrical Stimulation;Therapeutic activities;Therapeutic exercise;Manual techniques;Taping   PT Next Visit Plan assess goals and  compliance with home program. focus on manua work to upper quadrant with manual lymph drainage in supine and sidelying, shoulder PROM; coninue with myofacscial work to right posteior axilla as tolerated   Consulted and Agree with Plan of Care Patient      Patient will benefit from skilled therapeutic intervention in order to improve the following deficits and impairments:  Decreased range of motion, Increased fascial restricitons, Impaired UE functional use, Decreased activity tolerance, Decreased knowledge of precautions, Impaired perceived functional ability, Pain, Decreased knowledge of use of DME, Decreased strength, Increased edema, Postural dysfunction  Visit Diagnosis: Stiffness of right shoulder, not elsewhere classified  Pain in right shoulder  Localized swelling, mass and lump, right upper limb  Abnormal posture  Muscle weakness  (generalized)     Problem List Patient Active Problem List   Diagnosis Date Noted  . Chemotherapy induced neutropenia ([place]) 05/14/2015  . Chemotherapy-induced neuropathy ([place]) 04/08/2015  . 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 Tooth ache 01/09/2015  . Genetic testing 12/26/2014  . Family history of breast cancer   . Breast cancer of lower-inner quadrant of right female breast ([place]) 11/25/2014  . Migraine 11/19/2014   Susan Wilson PT  Susan Wilson 12/15/2015, 11:32 AM  [place], [place], [address] Phone: [phone]   Fax:  [phone]  Name: Susan Wilson MRN: [id number] Date of Birth: 1975-12-29

## 2015-12-15 NOTE — Patient Instructions (Addendum)
Www.youtube.com Lymphatic flow series with Shoosh Lettick Crotzer   Strengthening: Resisted Flexion   Hold tubing with left arm at side. Pull forward and up. Move shoulder through pain-free range of motion. Repeat 10____ times per set. Do ___3_ sets per session. Strengthening: Resisted Internal Rotation   Hold tubing in left hand, elbow at side and forearm out. Rotate forearm in across body. Repeat _10___ times per set. Do 3____ sets per session.   http://orth.exer.us/830    Strengthening: Resisted Extension   Hold tubing in right hand, arm forward. Pull arm back, elbow straight. Repeat _10___ times per set. Do __3__ sets per session.      Copyright  VHI. All rights reserved.  Strengthening: Resisted External Rotation   Hold tubing in right hand, elbow at side and forearm across body. Rotate forearm out. Repeat _10___ times per set. Do __3__ sets per session. .    Copyright  VHI. All rights reserved.  http://orth.exer.us/824  thttp://orth.exer.us/832   Copyright  VHI. All rights reserved.

## 2015-12-17 ENCOUNTER — Ambulatory Visit: Payer: 59 | Admitting: Physical Therapy

## 2015-12-17 DIAGNOSIS — R293 Abnormal posture: Secondary | ICD-10-CM

## 2015-12-17 DIAGNOSIS — M25611 Stiffness of right shoulder, not elsewhere classified: Secondary | ICD-10-CM

## 2015-12-17 DIAGNOSIS — R2231 Localized swelling, mass and lump, right upper limb: Secondary | ICD-10-CM

## 2015-12-17 DIAGNOSIS — M25511 Pain in right shoulder: Secondary | ICD-10-CM

## 2015-12-17 DIAGNOSIS — M6281 Muscle weakness (generalized): Secondary | ICD-10-CM

## 2015-12-17 NOTE — Therapy (Signed)
[place], [place], [address] Phone: [phone]   Fax:  [phone]  Physical Therapy Treatment  Patient Details  Name: Susan Wilson MRN: [id number] Date of Birth: 1975/08/25 Referring Provider: Dr. Donne Hazel   Encounter Date: 12/17/2015      PT End of Session - 12/17/15 1233    Visit Number 9   Number of Visits 13   Date for PT Re-Evaluation 12/30/15   PT Start Time 9983   PT Stop Time 1100   PT Time Calculation (min) 45 min   Activity Tolerance Patient tolerated treatment well   Behavior During Therapy [facility] for tasks assessed/performed      Past Medical History:  Diagnosis Date  . Anemia   . 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 Back pain   . Breast cancer ([place])   . Breast cancer of lower-inner quadrant of right female breast ([place]) 11/25/2014  . Family history of breast cancer   . Headache   . Hernia, umbilical   . Hernia, umbilical     Past Surgical History:  Procedure Laterality Date  . BREAST RECONSTRUCTION Right 06/23/2015   Procedure: ONCOPLASTIC RIGHT BREAST REDUCTION;  Surgeon: Irene Limbo, MD;  Location: [place];  Service: Plastics;  Laterality: Right;  . BREAST REDUCTION SURGERY Left 06/23/2015   Procedure: MAMMARY REDUCTION  (BREAST) LEFT BREAST FOR SYMETRY (BILATERAL BREAST REDUCTION);  Surgeon: Irene Limbo, MD;  Location: [place];  Service: Plastics;  Laterality: Left;  . PORTACATH PLACEMENT Right 12/16/2014   Procedure: INSERTION PORT-A-CATH WITH ULTRASOUND;  Surgeon: Rolm Bookbinder, MD;  Location: [place];  Service: General;  Laterality: Right;  . RADIOACTIVE SEED GUIDED MASTECTOMY WITH AXILLARY SENTINEL LYMPH NODE BIOPSY Right 06/15/2015   Procedure: RADIOACTIVE SEED GUIDED PARTIAL MASTECTOMY WITH AXILLARY SENTINEL LYMPH NODE BIOPSY;  Surgeon: Rolm Bookbinder, MD;  Location: [place];  Service:  General;  Laterality: Right;    There were no vitals filed for this visit.      Subjective Assessment - 12/17/15 1020    Subjective Pt had alot of pain after last session especailly at back of shoulder. it is still achy and sore.  she feels it is from the soft tissue work. Pt states she is still watching the klosetraining DVD and is trying to do manual lymph drainage at home    Pertinent History right lumpectomy with 6 nodes removed end of january 2017,and had breast reduction on both sides, she will have another on the left side schedules for september.  had chemotherapy before surgery and completed radiation June 5,2017    Patient Stated Goals to be able to move my arm around (rotate ) with less pain    Currently in Pain? Yes   Pain Score 2    Pain Location Shoulder   Pain Orientation Right   Pain Descriptors / Indicators Aching;Sore   Pain Type Chronic pain                         [facility] Adult PT Treatment/Exercise - 12/17/15 0001      Self-Care   Other Self-Care Comments  provided thick foam patch to wear in bra at lateral chest      Lumbar Exercises: Aerobic   Stationary Bike 5 minutes at level 3      Shoulder Exercises: Standing   Other Standing Exercises lift yellow ball up overhead x 10 reps to strengthen core  muscles.      Shoulder Exercises: Pulleys   Flexion 2 minutes   ABduction 2 minutes     Shoulder Exercises: Stretch   Other Shoulder Stretches in left sidelying over folded pillow to stretch right sidelbody    Other Shoulder Stretches ball up wall  in "I" and in "Y"      Manual Therapy   Manual Lymphatic Drainage (MLD) short neck. superficial abdominals. right antetior and posterior shoulder arm and hand with return along pathways, then to sidelying for upper back and scapular area                 PT Education - 12/17/15 1232    Education provided Yes   Education Details encouraged pt to continue exercise at home including pool  exercise when available    Person(s) Educated Patient   Methods Explanation   Comprehension Verbalized understanding           Short Term Clinic Goals - 12/08/15 1037      CC Short Term Goal  #1   Title Patient with verbalize an understanding of lymphedema risk reduction precautions   Status Achieved     CC Short Term Goal  #2   Title Patient will be independent in a basic  home exercise program   Status Achieved     CC Short Term Goal  #3   Title Patient will report a decrease in pain by 50% so they can perform daily activities with greater ease  Pt reports 40% at this time. 12/08/15   Status Partially Met             Long Term Clinic Goals - 12/08/15 1040      CC Long Term Goal  #1   Title Right shoulder active flexion to at least 145 degrees for improved overhead reach.   Baseline 120 on 11/18/2015, 133 on 11/26/2015, 132 degrees 12/03/15; 136 degrees 12/08/15   Status On-going     CC Long Term Goal  #2   Title Right shoulder external rotation to 70 degrees. to allow for better shoulder abduction    Baseline 55 on 11/18/2015; 43 degrees 12/08/15   Status On-going     CC Long Term Goal  #3   Title Left shoulder abduction to at least 150 degrees for improved ADLs.   Baseline 100 on 11/18/2015, 135 on 11/26/2015, 110 degrees 12/03/15; 116 degrees 12/08/15   Status On-going     CC Long Term Goal  #4   Title Patient will decrease the DASH score to < 30   to demonstrate increased functional use of upper extremity   Status On-going     CC Long Term Goal  #5   Title Patient will report a decrease in pain by 75% so they can perform daily activities with greater ease   Baseline 40% improvement reported 12/08/15   Status On-going            Plan - 12/17/15 1233    Clinical Impression Statement Visible and palpable fullness at right shoulder and back that was helped by manual lymph drainage.  Upgraded to add foam pad to lateral chest in bra for compression to this area.  Focused on active stretching today and pt did better with that    Rehab Potential Good   Clinical Impairments Affecting Rehab Potential previous chemo and right upper quadrant radiation    PT Treatment/Interventions ADLs/Self Care Home Management;Manual lymph drainage;Compression bandaging;Scar mobilization;Passive range of motion;Patient/family education;DME Instruction;Functional mobility  training;Electrical Stimulation;Therapeutic activities;Therapeutic exercise;Manual techniques;Taping   PT Next Visit Plan assess goals. assess effect of foam on pain and fullness continue with stretching and manual lymph drainage.  Add kinesiotape to upper back/posterior shoulder    Consulted and Agree with Plan of Care Patient      Patient will benefit from skilled therapeutic intervention in order to improve the following deficits and impairments:  Decreased range of motion, Increased fascial restricitons, Impaired UE functional use, Decreased activity tolerance, Decreased knowledge of precautions, Impaired perceived functional ability, Pain, Decreased knowledge of use of DME, Decreased strength, Increased edema, Postural dysfunction  Visit Diagnosis: Stiffness of right shoulder, not elsewhere classified  Pain in right shoulder  Localized swelling, mass and lump, right upper limb  Abnormal posture  Muscle weakness (generalized)     Problem List Patient Active Problem List   Diagnosis Date Noted  . Chemotherapy induced neutropenia ([place]) 05/14/2015  . 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 Tooth ache 01/09/2015  . Genetic testing 12/26/2014  . Family history of breast cancer   . Breast cancer of lower-inner quadrant of right female breast ([place]) 11/25/2014  . Migraine 11/19/2014   Donato Heinz. Owens Shark PT  Norwood Levo 12/17/2015, 12:42 PM  [place], [place], [address] Phone: [phone]   Fax:  [phone]  Name: PAISLEA HATTON MRN: [id number] Date of Birth: April 27, 1976

## 2015-12-22 ENCOUNTER — Ambulatory Visit: Payer: 59 | Admitting: Physical Therapy

## 2015-12-22 ENCOUNTER — Encounter: Payer: Self-pay | Admitting: Physical Therapy

## 2015-12-22 DIAGNOSIS — M25611 Stiffness of right shoulder, not elsewhere classified: Secondary | ICD-10-CM

## 2015-12-22 DIAGNOSIS — M25511 Pain in right shoulder: Secondary | ICD-10-CM

## 2015-12-22 DIAGNOSIS — R2231 Localized swelling, mass and lump, right upper limb: Secondary | ICD-10-CM

## 2015-12-22 NOTE — Therapy (Signed)
[place], [place], [address] Phone: [phone]   Fax:  [phone]  Physical Therapy Treatment  Patient Details  Name: Susan Wilson MRN: [id number] Date of Birth: 04/05/1976 Referring Provider: Dr. Donne Hazel   Encounter Date: 12/22/2015      PT End of Session - 12/22/15 0943    Visit Number 10   Number of Visits 13   Date for PT Re-Evaluation 12/30/15   PT Start Time 0801   PT Stop Time 0845   PT Time Calculation (min) 44 min   Activity Tolerance Patient tolerated treatment well   Behavior During Therapy [facility] for tasks assessed/performed      Past Medical History:  Diagnosis Date  . Anemia   . 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 Back pain   . Breast cancer ([place])   . Breast cancer of lower-inner quadrant of right female breast ([place]) 11/25/2014  . Family history of breast cancer   . Headache   . Hernia, umbilical   . Hernia, umbilical     Past Surgical History:  Procedure Laterality Date  . BREAST RECONSTRUCTION Right 06/23/2015   Procedure: ONCOPLASTIC RIGHT BREAST REDUCTION;  Surgeon: Irene Limbo, MD;  Location: [place];  Service: Plastics;  Laterality: Right;  . BREAST REDUCTION SURGERY Left 06/23/2015   Procedure: MAMMARY REDUCTION  (BREAST) LEFT BREAST FOR SYMETRY (BILATERAL BREAST REDUCTION);  Surgeon: Irene Limbo, MD;  Location: [place];  Service: Plastics;  Laterality: Left;  . PORTACATH PLACEMENT Right 12/16/2014   Procedure: INSERTION PORT-A-CATH WITH ULTRASOUND;  Surgeon: Rolm Bookbinder, MD;  Location: [place];  Service: General;  Laterality: Right;  . RADIOACTIVE SEED GUIDED MASTECTOMY WITH AXILLARY SENTINEL LYMPH NODE BIOPSY Right 06/15/2015   Procedure: RADIOACTIVE SEED GUIDED PARTIAL MASTECTOMY WITH AXILLARY SENTINEL LYMPH NODE BIOPSY;  Surgeon: Rolm Bookbinder, MD;  Location: [place];  Service:  General;  Laterality: Right;    There were no vitals filed for this visit.      Subjective Assessment - 12/22/15 0805    Subjective The foam worked really well. My swelling felt a little softer. I have been trying to self drainage but it is hard to reach my back.    Pertinent History right lumpectomy with 6 nodes removed end of january 2017,and had breast reduction on both sides, she will have another on the left side schedules for september.  had chemotherapy before surgery and completed radiation June 5,2017    Patient Stated Goals to be able to move my arm around (rotate ) with less pain    Currently in Pain? Yes   Pain Score 1    Pain Location Arm   Pain Orientation Right   Pain Descriptors / Indicators Aching                         [facility] Adult PT Treatment/Exercise - 12/22/15 0001      Lumbar Exercises: Aerobic   Stationary Bike 5 minutes at level 3      Shoulder Exercises: Standing   Other Standing Exercises lift yellow ball up overhead x 10 reps to strengthen core muscles.      Shoulder Exercises: Pulleys   Flexion 2 minutes   ABduction 2 minutes     Shoulder Exercises: Stretch   Other Shoulder Stretches ball up wall  in "I" and in "Y"   with 1 lb weight     Manual  Therapy   Manual Lymphatic Drainage (MLD) In supine, short neck, superficial abdominals, right inguinal nodes, right axillo-inguinal anastomosis, left axillary nodes, anterior interaxillay anastamosis avoiding port on right chest, right anterior and posterior shoulder upper arm forearm and hand and returning along pathways    Passive ROM In Supine to Rt shoulder into flexion, abduction and er all gently and to pts tolerance.  pt extremely tight and limited in [place] Clinic Goals - 12/22/15 1610      CC Short Term Goal  #1   Title Patient with verbalize an understanding of lymphedema risk reduction precautions   Status Achieved     CC Short Term  Goal  #2   Title Patient will be independent in a basic  home exercise program   Status Achieved     CC Short Term Goal  #3   Title Patient will report a decrease in pain by 50% so they can perform daily activities with greater ease   Baseline 12/22/15- pt reports she has had a 40% decrease in pain since beginning therapy   Status Partially [place] - 12/22/15 9604      CC Long Term Goal  #1   Title Right shoulder active flexion to at least 145 degrees for improved overhead reach.   Baseline 120 on 11/18/2015, 133 on 11/26/2015, 132 degrees 12/03/15; 136 degrees 12/08/15, 12/22/15- 128 degrees   Time 6   Period Weeks   Status On-going     CC Long Term Goal  #2   Title Right shoulder external rotation to 70 degrees. to allow for better shoulder abduction    Baseline 55 on 11/18/2015; 43 degrees 12/08/15, 12/22/15- 64 degrees   Time 6   Period Weeks   Status On-going     CC Long Term Goal  #3   Title Left shoulder abduction to at least 150 degrees for improved ADLs.   Baseline 100 on 11/18/2015, 135 on 11/26/2015, 110 degrees 12/03/15; 116 degrees 12/08/15, 12/22/15- 116 degrees with some scaption   Time 6   Period Weeks   Status On-going     CC Long Term Goal  #4   Title Patient will decrease the DASH score to < 30   to demonstrate increased functional use of upper extremity   Baseline 56.82 on 11/18/2015   Time 6   Period Weeks   Status On-going     CC Long Term Goal  #5   Title Patient will report a decrease in pain by 75% so they can perform daily activities with greater ease   Baseline 40% improvement reported 12/08/15, 12/22/15- 40% improvement noted   Time 6   Period Weeks   Status On-going            Plan - 12/22/15 0944    Clinical Impression Statement Assessed pt's progress towards goals in therapy today. Pt continues to demonstrate limited right shoulder ROM with significant tightness during PROM. Pt states the foam did help her lateral chest  swelling.    Rehab Potential Good   Clinical Impairments Affecting Rehab Potential previous chemo and right upper quadrant radiation    PT Frequency 2x / week   PT Duration 6 weeks   PT Treatment/Interventions ADLs/Self Care Home Management;Manual lymph drainage;Compression bandaging;Scar mobilization;Passive range of motion;Patient/family education;DME Instruction;Functional  mobility training;Electrical Stimulation;Therapeutic activities;Therapeutic exercise;Manual techniques;Taping   PT Next Visit Plan continue MLD, add kinesiotape to upper back/posterior shoulder, PROM to increase R shoulder ROM, scapular mobilizations   Consulted and Agree with Plan of Care Patient      Patient will benefit from skilled therapeutic intervention in order to improve the following deficits and impairments:  Decreased range of motion, Increased fascial restricitons, Impaired UE functional use, Decreased activity tolerance, Decreased knowledge of precautions, Impaired perceived functional ability, Pain, Decreased knowledge of use of DME, Decreased strength, Increased edema, Postural dysfunction  Visit Diagnosis: Stiffness of right shoulder, not elsewhere classified  Pain in right shoulder  Localized swelling, mass and lump, right upper limb     Problem List Patient Active Problem List   Diagnosis Date Noted  . Chemotherapy induced neutropenia ([place]) 05/14/2015  . 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 Tooth ache 01/09/2015  . Genetic testing 12/26/2014  . Family history of breast cancer   . Breast cancer of lower-inner quadrant of right female breast ([place]) 11/25/2014  . Migraine 11/19/2014    Alexia Freestone 12/22/2015, 9:46 AM  [place], [place], [address] Phone: [phone]   Fax:  [phone]  Name: Susan Wilson MRN: [id number] Date of  Birth: 1976-02-01   Allyson Sabal, PT 12/22/15 9:46 AM

## 2015-12-24 ENCOUNTER — Encounter: Payer: Self-pay | Admitting: Physical Therapy

## 2015-12-24 ENCOUNTER — Ambulatory Visit: Payer: 59 | Admitting: Physical Therapy

## 2015-12-24 DIAGNOSIS — M25611 Stiffness of right shoulder, not elsewhere classified: Secondary | ICD-10-CM | POA: Diagnosis not present

## 2015-12-24 DIAGNOSIS — M25511 Pain in right shoulder: Secondary | ICD-10-CM

## 2015-12-24 NOTE — H&P (Signed)
  Subjective:    Patient ID: Susan Wilson is a 40 y.o. female.  Follow-up    6 months post op oncoplastic reduction. We have noted asymmetry breasts with left breast larger since early post op and plan additional reduction left breast. Also plan removal port  Completed radiation over right 6.5.17. Seen by Dr. Donne Hazel and discussed port removal at time of revision reconstruction. She returns reporting she does not plan to return to work until after symmetry breast procedure. She was also referred back for additional PT. She states her breasts look "awful" and does not want to wait any longer for surgery. States asymmetry noticeable in clothes.   Presented with palpable mass in her right breast MMG and US revealed breast density was category B,  hyperdense mass in the right LIQ associated with skin thickening. There was also a 5 mm nodule posteriorly at the 8:30 o'clock position in the right breast. There were several hyperdense nodules in the right axilla. By ultrasound this was lobulated and appear to involve the skin, measuring up to 4.1 cm.  The right axilla showed 3 hypervascular lymph nodes with prominent cortical thickening. Right breast mass and one axillary node with IDC ER/PR - HER-2 equivocal. LN negative though felt to be discordant, repeat biopsy negative. Underwent neoadjuvant chemotherapy, completed end 04/2015. Underwent lumpectomy with SLN with final pathology residual 0.9 cm IDC, margins clear and 0/4 SLN negative.  Genetics negative  Prior 44 DD Right breast reduction 811 g, left breast reduction 858 g. Weight of prior right lumpectomy not recorded.  Review of Systems Neuropathy from chemotherapy and is on leave from work due to this.    Objective:   Physical Exam  Cardiovascular: Normal rate, regular rhythm and normal heart sounds.   Pulmonary/Chest: Effort normal and breath sounds normal.  Abdominal: Soft.   Right chest port Right breast firm  consistent with radiation, hyperpigmentation breast and hypopigmentation scars left larger volume significant  SN to nipple R 24.5 L 27 cm BW R 21 L 22cm Nipple to IMF R 14 L 15 cm Left NAC has widened and approximately 65 mm diameter vs 50 mm on right   Assessment:     Right breast cancer, neoadjuvant chemotherapy S/p lumpectomy, SLN S/p oncoplastic reduction    Plan:      Plan revision reduction on left as volume asymmetric. I have counseled her to wait 3-6 months to await changes to right breast as this may continue to contract in volume post radiation. We will plan left reduction and removal port, counseled that the radiated breast will not develop much ptosis or changes with aging vs the left breast will develop recurrent ptosis, change with wt gain and loss. She states her weight was stable since last visit, however she has experienced 15 lb weight gain since time of surgery and this contributes to asymmetry.   Plan OP surgery, possible drain, reviewed will utilize prior scars.   Irene Limbo, MD [facility] [phone], pin [phone]

## 2015-12-24 NOTE — Therapy (Signed)
[place], [place], [address] Phone: [phone]   Fax:  [phone]  Physical Therapy Treatment  Patient Details  Name: Susan Wilson MRN: [id number] Date of Birth: 02/08/1976 Referring Provider: Dr. Donne Hazel   Encounter Date: 12/24/2015      PT End of Session - 12/24/15 1105    Visit Number 11   Number of Visits 13   Date for PT Re-Evaluation 12/30/15   PT Start Time 3846   PT Stop Time 1102   PT Time Calculation (min) 47 min   Activity Tolerance Patient tolerated treatment well   Behavior During Therapy [facility] for tasks assessed/performed      Past Medical History:  Diagnosis Date  . Anemia   . 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 Back pain   . Breast cancer ([place])   . Breast cancer of lower-inner quadrant of right female breast ([place]) 11/25/2014  . Family history of breast cancer   . Headache   . Hernia, umbilical   . Hernia, umbilical     Past Surgical History:  Procedure Laterality Date  . BREAST RECONSTRUCTION Right 06/23/2015   Procedure: ONCOPLASTIC RIGHT BREAST REDUCTION;  Surgeon: Irene Limbo, MD;  Location: [place];  Service: Plastics;  Laterality: Right;  . BREAST REDUCTION SURGERY Left 06/23/2015   Procedure: MAMMARY REDUCTION  (BREAST) LEFT BREAST FOR SYMETRY (BILATERAL BREAST REDUCTION);  Surgeon: Irene Limbo, MD;  Location: [place];  Service: Plastics;  Laterality: Left;  . PORTACATH PLACEMENT Right 12/16/2014   Procedure: INSERTION PORT-A-CATH WITH ULTRASOUND;  Surgeon: Rolm Bookbinder, MD;  Location: [place];  Service: General;  Laterality: Right;  . RADIOACTIVE SEED GUIDED MASTECTOMY WITH AXILLARY SENTINEL LYMPH NODE BIOPSY Right 06/15/2015   Procedure: RADIOACTIVE SEED GUIDED PARTIAL MASTECTOMY WITH AXILLARY SENTINEL LYMPH NODE BIOPSY;  Surgeon: Rolm Bookbinder, MD;  Location: [place];  Service:  General;  Laterality: Right;    There were no vitals filed for this visit.      Subjective Assessment - 12/24/15 1017    Subjective Exercises have been going pretty good. My swelling is doing ok. I have putting on the TG soft and I think that has been helping.    Pertinent History right lumpectomy with 6 nodes removed end of january 2017,and had breast reduction on both sides, she will have another on the left side schedules for september.  had chemotherapy before surgery and completed radiation June 5,2017    Patient Stated Goals to be able to move my arm around (rotate ) with less pain    Currently in Pain? No/denies   Pain Score 0-No pain                         [facility] Adult PT Treatment/Exercise - 12/24/15 0001      Lumbar Exercises: Aerobic   Stationary Bike 5 minutes at level 3      Shoulder Exercises: Standing   Other Standing Exercises lift yellow ball up overhead x 10 reps to strengthen core muscles.      Shoulder Exercises: Pulleys   Flexion 2 minutes   ABduction 2 minutes     Shoulder Exercises: Stretch   Other Shoulder Stretches finger ladder in abduction x 10  made it to 27   Other Shoulder Stretches standing against wall  and lifting weights in "I" and in "Y"   with 2 lb weight  Manual Therapy   Manual Therapy Passive ROM;Scapular mobilization   Myofascial Release --   Scapular Mobilization in left sidelying to right scapular into protraction while moving arm into flexion   Passive ROM In Supine to Rt shoulder into flexion, abduction and er all gently and to pts tolerance.  pt having impingement so moved into left sidelying                   Short Term Clinic Goals - 12/22/15 8185      CC Short Term Goal  #1   Title Patient with verbalize an understanding of lymphedema risk reduction precautions   Status Achieved     CC Short Term Goal  #2   Title Patient will be independent in a basic  home exercise program   Status  Achieved     CC Short Term Goal  #3   Title Patient will report a decrease in pain by 50% so they can perform daily activities with greater ease   Baseline 12/22/15- pt reports she has had a 40% decrease in pain since beginning therapy   Status Partially [place] - 12/22/15 6314      CC Long Term Goal  #1   Title Right shoulder active flexion to at least 145 degrees for improved overhead reach.   Baseline 120 on 11/18/2015, 133 on 11/26/2015, 132 degrees 12/03/15; 136 degrees 12/08/15, 12/22/15- 128 degrees   Time 6   Period Weeks   Status On-going     CC Long Term Goal  #2   Title Right shoulder external rotation to 70 degrees. to allow for better shoulder abduction    Baseline 55 on 11/18/2015; 43 degrees 12/08/15, 12/22/15- 64 degrees   Time 6   Period Weeks   Status On-going     CC Long Term Goal  #3   Title Left shoulder abduction to at least 150 degrees for improved ADLs.   Baseline 100 on 11/18/2015, 135 on 11/26/2015, 110 degrees 12/03/15; 116 degrees 12/08/15, 12/22/15- 116 degrees with some scaption   Time 6   Period Weeks   Status On-going     CC Long Term Goal  #4   Title Patient will decrease the DASH score to < 30   to demonstrate increased functional use of upper extremity   Baseline 56.82 on 11/18/2015   Time 6   Period Weeks   Status On-going     CC Long Term Goal  #5   Title Patient will report a decrease in pain by 75% so they can perform daily activities with greater ease   Baseline 40% improvement reported 12/08/15, 12/22/15- 40% improvement noted   Time 6   Period Weeks   Status On-going            Plan - 12/24/15 1105    Clinical Impression Statement Pt demonstrated improved right shoulder flexion during left sidelying PROM with scapular mobilizations. She has impingement with PROM in supine and this limits her PROM. She is progressing with her exercises and was able to tolerate 2lb instead of 1 lb today.    Rehab Potential Good    Clinical Impairments Affecting Rehab Potential previous chemo and right upper quadrant radiation    PT Frequency 2x / week   PT Duration 6 weeks   PT Treatment/Interventions ADLs/Self Care Home Management;Manual lymph drainage;Compression bandaging;Scar mobilization;Passive range of motion;Patient/family education;DME Instruction;Functional mobility training;Electrical  Stimulation;Therapeutic activities;Therapeutic exercise;Manual techniques;Taping   PT Next Visit Plan continue MLD, add kinesiotape to upper back/posterior shoulder, PROM to increase R shoulder ROM, scapular mobilizations   Consulted and Agree with Plan of Care Patient      Patient will benefit from skilled therapeutic intervention in order to improve the following deficits and impairments:  Decreased range of motion, Increased fascial restricitons, Impaired UE functional use, Decreased activity tolerance, Decreased knowledge of precautions, Impaired perceived functional ability, Pain, Decreased knowledge of use of DME, Decreased strength, Increased edema, Postural dysfunction  Visit Diagnosis: Stiffness of right shoulder, not elsewhere classified  Pain in right shoulder     Problem List Patient Active Problem List   Diagnosis Date Noted  . Chemotherapy induced neutropenia ([place]) 05/14/2015  . 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 Tooth ache 01/09/2015  . Genetic testing 12/26/2014  . Family history of breast cancer   . Breast cancer of lower-inner quadrant of right female breast ([place]) 11/25/2014  . Migraine 11/19/2014    Alexia Freestone 12/24/2015, 11:07 AM  [place], [place], [address] Phone: [phone]   Fax:  [phone]  Name: CORRIN SIELING MRN: [id number] Date of Birth: [birth year]   Allyson Sabal, PT 12/24/15 11:07 AM

## 2015-12-28 ENCOUNTER — Ambulatory Visit (HOSPITAL_BASED_OUTPATIENT_CLINIC_OR_DEPARTMENT_OTHER): Payer: 59

## 2015-12-28 DIAGNOSIS — Z452 Encounter for adjustment and management of vascular access device: Secondary | ICD-10-CM

## 2015-12-28 DIAGNOSIS — Z95828 Presence of other vascular implants and grafts: Secondary | ICD-10-CM | POA: Insufficient documentation

## 2015-12-28 DIAGNOSIS — C50311 Malignant neoplasm of lower-inner quadrant of right female breast: Secondary | ICD-10-CM | POA: Diagnosis not present

## 2015-12-28 DIAGNOSIS — D701 Agranulocytosis secondary to cancer chemotherapy: Secondary | ICD-10-CM

## 2015-12-28 DIAGNOSIS — T451X5A Adverse effect of antineoplastic and immunosuppressive drugs, initial encounter: Secondary | ICD-10-CM

## 2015-12-28 MED ORDER — SODIUM CHLORIDE 0.9 % IJ SOLN
10.0000 mL | INTRAMUSCULAR | Status: DC | PRN
  Administered 2015-12-28: 10 mL via INTRAVENOUS
  Filled 2015-12-28: qty 10

## 2015-12-28 MED ORDER — HEPARIN SOD (PORK) LOCK FLUSH 100 UNIT/ML IV SOLN
500.0000 [IU] | Freq: Once | INTRAVENOUS | Status: AC | PRN
  Administered 2015-12-28: 500 [IU] via INTRAVENOUS
  Filled 2015-12-28: qty 5

## 2015-12-29 ENCOUNTER — Ambulatory Visit: Payer: 59

## 2015-12-29 DIAGNOSIS — M25611 Stiffness of right shoulder, not elsewhere classified: Secondary | ICD-10-CM | POA: Diagnosis not present

## 2015-12-29 DIAGNOSIS — R2231 Localized swelling, mass and lump, right upper limb: Secondary | ICD-10-CM

## 2015-12-29 DIAGNOSIS — R293 Abnormal posture: Secondary | ICD-10-CM

## 2015-12-29 DIAGNOSIS — M25511 Pain in right shoulder: Secondary | ICD-10-CM

## 2015-12-29 DIAGNOSIS — M6281 Muscle weakness (generalized): Secondary | ICD-10-CM

## 2015-12-29 NOTE — Therapy (Signed)
[place], [place], [address] Phone: [phone]   Fax:  [phone]  Physical Therapy Treatment  Patient Details  Name: Susan Wilson MRN: [id number] Date of Birth: 08-14-1975 Referring Provider: Dr. Donne Hazel   Encounter Date: 12/29/2015      PT End of Session - 12/29/15 1231    Visit Number 12   Number of Visits 13   Date for PT Re-Evaluation 01/14/16   PT Start Time 0933   PT Stop Time 1021   PT Time Calculation (min) 48 min   Activity Tolerance Patient tolerated treatment well   Behavior During Therapy [facility] for tasks assessed/performed      Past Medical History:  Diagnosis Date  . Anemia   . 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 Back pain   . Breast cancer ([place])   . Breast cancer of lower-inner quadrant of right female breast ([place]) 11/25/2014  . Family history of breast cancer   . Headache   . Hernia, umbilical   . Hernia, umbilical     Past Surgical History:  Procedure Laterality Date  . BREAST RECONSTRUCTION Right 06/23/2015   Procedure: ONCOPLASTIC RIGHT BREAST REDUCTION;  Surgeon: Irene Limbo, MD;  Location: [place];  Service: Plastics;  Laterality: Right;  . BREAST REDUCTION SURGERY Left 06/23/2015   Procedure: MAMMARY REDUCTION  (BREAST) LEFT BREAST FOR SYMETRY (BILATERAL BREAST REDUCTION);  Surgeon: Irene Limbo, MD;  Location: [place];  Service: Plastics;  Laterality: Left;  . PORTACATH PLACEMENT Right 12/16/2014   Procedure: INSERTION PORT-A-CATH WITH ULTRASOUND;  Surgeon: Rolm Bookbinder, MD;  Location: [place];  Service: General;  Laterality: Right;  . RADIOACTIVE SEED GUIDED MASTECTOMY WITH AXILLARY SENTINEL LYMPH NODE BIOPSY Right 06/15/2015   Procedure: RADIOACTIVE SEED GUIDED PARTIAL MASTECTOMY WITH AXILLARY SENTINEL LYMPH NODE BIOPSY;  Surgeon: Rolm Bookbinder, MD;  Location: [place];  Service:  General;  Laterality: Right;    There were no vitals filed for this visit.      Subjective Assessment - 12/29/15 0936    Subjective Nothing new to report, doing good.    Pertinent History right lumpectomy with 6 nodes removed end of january 2017,and had breast reduction on both sides, she will have another on the left side schedules for september.  had chemotherapy before surgery and completed radiation June 5,2017    Patient Stated Goals to be able to move my arm around (rotate ) with less pain    Currently in Pain? No/denies            [facility] PT Assessment - 12/29/15 0001      AROM   Right Shoulder Flexion 140 Degrees   Right Shoulder ABduction 126 Degrees   Right Shoulder Internal Rotation 59 Degrees   Right Shoulder External Rotation 76 Degrees              Quick Dash - 12/29/15 0001    Open a tight or new jar Mild difficulty   Do heavy household chores (wash walls, wash floors) Moderate difficulty   Carry a shopping bag or briefcase Moderate difficulty   Wash your back Moderate difficulty   Use a knife to cut food Mild difficulty   Recreational activities in which you take some force or impact through your arm, shoulder, or hand (golf, hammering, tennis) Moderate difficulty   During the past week, to what extent has your arm, shoulder or hand problem interfered with your normal social activities  with family, friends, neighbors, or groups? Modererately   During the past week, to what extent has your arm, shoulder or hand problem limited your work or other regular daily activities Modererately   Arm, shoulder, or hand pain. Moderate   Tingling (pins and needles) in your arm, shoulder, or hand Moderate   Difficulty Sleeping Moderate difficulty   DASH Score 45.45 %               [facility] Adult PT Treatment/Exercise - 12/29/15 0001      Shoulder Exercises: Standing   Other Standing Exercises lift yellow ball up overhead x 10 reps to strengthen core muscles.     Other Standing Exercises Wall pushups x 10; Standing with back aganist wall for bil UE flexion and scaption to 90 degrees with 2 lbs 10 times each.     Shoulder Exercises: Pulleys   Flexion 2 minutes   ABduction 2 minutes   ABduction Limitations Minor VC to decrease scapular compensations     Shoulder Exercises: Stretch   Other Shoulder Stretches Finger Ladder abduction Rt UE 10 times (up to 27) )     Manual Therapy   Manual Therapy Passive ROM   Scapular Mobilization --   Passive ROM In Supine to Rt shoulder into flexion, abduction and er all gently and to pts tolerance.                   Short Term Clinic Goals - 12/29/15 1238      CC Short Term Goal  #1   Title Patient with verbalize an understanding of lymphedema risk reduction precautions   Status Achieved     CC Short Term Goal  #2   Title Patient will be independent in a basic  home exercise program   Status Achieved     CC Short Term Goal  #3   Title Patient will report a decrease in pain by 50% so they can perform daily activities with greater ease   Baseline 12/22/15- pt reports she has had a 40% decrease in pain since beginning therapy; 40% reduction with OH reaching, 50% improvement with behind the back reaching 12/29/15   Status Partially [place] - 12/29/15 1020      CC Long Term Goal  #1   Title Right shoulder active flexion to at least 145 degrees for improved overhead reach.   Baseline 120 on 11/18/2015, 133 on 11/26/2015, 132 degrees 12/03/15; 136 degrees 12/08/15, 12/22/15- 128 degrees; 140 degrees 12/29/15   Status On-going     CC Long Term Goal  #2   Title Right shoulder external rotation to 70 degrees. to allow for better shoulder abduction    Baseline 55 on 11/18/2015; 43 degrees 12/08/15, 12/22/15- 64 degrees; 76 degrees 12/29/15     CC Long Term Goal  #3   Title Left shoulder abduction to at least 150 degrees for improved ADLs.   Baseline 100 on 11/18/2015, 135 on  11/26/2015, 110 degrees 12/03/15; 116 degrees 12/08/15, 12/22/15- 116 degrees with some scaption; 126 degrees 12/29/15   Status On-going     CC Long Term Goal  #4   Title Patient will decrease the DASH score to < 30   to demonstrate increased functional use of upper extremity   Baseline 56.82 on 11/18/2015; 45.45 12/29/15   Status On-going     CC Long Term Goal  #5   Title Patient  will report a decrease in pain by 75% so they can perform daily activities with greater ease   Baseline 40% improvement reported 12/08/15, 12/22/15- 40% improvement noted; 50% reported with back reaching into car, 40% with forward reaching 12/29/15    Status On-going            Plan - 12/29/15 1232    Clinical Impression Statement Pts A/ROM mesaurements have improved from last week in all directions measured meeting one of these goals and showing good progress towards remaining. Her Quick DASH has improved as well <11 points and she reports overall noticing some improvements in her ADLs due to less pain (reaching to back seat of car to reach her son) but is still limited with overhead flexion reporting the pain here has improved some but still limits her. She struggles with but has shown some improvement as well with decresing scapular compensations. Pt would like to continue coming until her reconstruction surgery scheduled for 01/15/16 so her ROM can be as good as possible by then aiding her post-op recovery.   Rehab Potential Good   Clinical Impairments Affecting Rehab Potential previous chemo and right upper quadrant radiation    PT Frequency 2x / week   PT Duration 2 weeks   PT Treatment/Interventions ADLs/Self Care Home Management;Manual lymph drainage;Compression bandaging;Scar mobilization;Passive range of motion;Patient/family education;DME Instruction;Functional mobility training;Electrical Stimulation;Therapeutic activities;Therapeutic exercise;Manual techniques;Taping   PT Next Visit Plan Renewal done today for pt to  continue until her surgery scheduled for 01/15/16. Continue MLD, add kinesiotape to upper back/posterior shoulder?, PROM to increase R shoulder ROM, scapular mobilizations   Consulted and Agree with Plan of Care Patient      Patient will benefit from skilled therapeutic intervention in order to improve the following deficits and impairments:  Decreased range of motion, Increased fascial restricitons, Impaired UE functional use, Decreased activity tolerance, Decreased knowledge of precautions, Impaired perceived functional ability, Pain, Decreased knowledge of use of DME, Decreased strength, Increased edema, Postural dysfunction  Visit Diagnosis: Stiffness of right shoulder, not elsewhere classified  Pain in right shoulder  Localized swelling, mass and lump, right upper limb  Abnormal posture  Muscle weakness (generalized)     Problem List Patient Active Problem List   Diagnosis Date Noted  . Port catheter in place 12/28/2015  . Chemotherapy induced neutropenia ([place]) 05/14/2015  . Chemotherapy-induced neuropathy ([place]) 04/08/2015  . 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 Tooth ache 01/09/2015  . Genetic testing 12/26/2014  . Family history of breast cancer   . Breast cancer of lower-inner quadrant of right female breast ([place]) 11/25/2014  . Migraine 11/19/2014    Otelia Limes, PTA 12/29/2015, 12:41 PM  [place], [place], [address] Phone: [phone]   Fax:  [phone]  Name: BERDELLA BACOT MRN: [id number] Date of Birth: Aug 24, 1975

## 2015-12-31 ENCOUNTER — Ambulatory Visit: Payer: 59 | Admitting: Physical Therapy

## 2015-12-31 ENCOUNTER — Encounter: Payer: Self-pay | Admitting: Physical Therapy

## 2015-12-31 DIAGNOSIS — M25611 Stiffness of right shoulder, not elsewhere classified: Secondary | ICD-10-CM | POA: Diagnosis not present

## 2015-12-31 DIAGNOSIS — M25511 Pain in right shoulder: Secondary | ICD-10-CM

## 2015-12-31 DIAGNOSIS — R2231 Localized swelling, mass and lump, right upper limb: Secondary | ICD-10-CM

## 2015-12-31 NOTE — Therapy (Signed)
[place], [place], [address] Phone: [phone]   Fax:  [phone]  Physical Therapy Treatment  Patient Details  Name: Susan Wilson MRN: [id number] Date of Birth: Dec 16, 1975 Referring Provider: Dr. Donne Hazel   Encounter Date: 12/31/2015      PT End of Session - 12/31/15 1728    Visit Number 13   Number of Visits 18   Date for PT Re-Evaluation 01/14/16   PT Start Time 2979   PT Stop Time 1429   PT Time Calculation (min) 44 min   Activity Tolerance Patient tolerated treatment well   Behavior During Therapy [facility] for tasks assessed/performed      Past Medical History:  Diagnosis Date  . Anemia   . 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 Back pain   . Breast cancer ([place])   . Breast cancer of lower-inner quadrant of right female breast ([place]) 11/25/2014  . Family history of breast cancer   . Headache   . Hernia, umbilical   . Hernia, umbilical     Past Surgical History:  Procedure Laterality Date  . BREAST RECONSTRUCTION Right 06/23/2015   Procedure: ONCOPLASTIC RIGHT BREAST REDUCTION;  Surgeon: Irene Limbo, MD;  Location: [place];  Service: Plastics;  Laterality: Right;  . BREAST REDUCTION SURGERY Left 06/23/2015   Procedure: MAMMARY REDUCTION  (BREAST) LEFT BREAST FOR SYMETRY (BILATERAL BREAST REDUCTION);  Surgeon: Irene Limbo, MD;  Location: [place];  Service: Plastics;  Laterality: Left;  . PORTACATH PLACEMENT Right 12/16/2014   Procedure: INSERTION PORT-A-CATH WITH ULTRASOUND;  Surgeon: Rolm Bookbinder, MD;  Location: [place];  Service: General;  Laterality: Right;  . RADIOACTIVE SEED GUIDED MASTECTOMY WITH AXILLARY SENTINEL LYMPH NODE BIOPSY Right 06/15/2015   Procedure: RADIOACTIVE SEED GUIDED PARTIAL MASTECTOMY WITH AXILLARY SENTINEL LYMPH NODE BIOPSY;  Surgeon: Rolm Bookbinder, MD;  Location: [place];  Service:  General;  Laterality: Right;    There were no vitals filed for this visit.      Subjective Assessment - 12/31/15 1348    Subjective The humidity doesn't help with swelling. I have been trying to do my exercises.    Pertinent History right lumpectomy with 6 nodes removed end of january 2017,and had breast reduction on both sides, she will have another on the left side schedules for september.  had chemotherapy before surgery and completed radiation June 5,2017    Patient Stated Goals to be able to move my arm around (rotate ) with less pain    Currently in Pain? Yes   Pain Score 1    Pain Location Axilla   Pain Orientation Right   Pain Descriptors / Indicators Aching                         [facility] Adult PT Treatment/Exercise - 12/31/15 0001      Shoulder Exercises: Standing   Other Standing Exercises lift yellow ball up overhead x 10 reps to strengthen core muscles.    Other Standing Exercises Wall pushups x 10; Standing with back aganist wall for bil UE flexion and scaption to 90 degrees with 2 lbs 10 times each.     Shoulder Exercises: Pulleys   Flexion 2 minutes   ABduction 2 minutes   ABduction Limitations Minor VC to decrease scapular compensations     Shoulder Exercises: Therapy Ball   ABduction 10 reps     Shoulder Exercises: Stretch  Other Shoulder Stretches Finger Ladder abduction Rt UE 10 times (up to 27) )  pt states she feels stretching with this     Manual Therapy   Manual Therapy Passive ROM;Edema management   Edema Management kinesiotape applied in fan shape with well near right inguinals and fan extending up right side of back to right upper back   Scapular Mobilization in left sidelying to right scapular into protraction while moving arm into flexion  also mobilized scapula in all directions   Passive ROM In Supine to Rt shoulder into flexion, abduction and er all gently and to pts tolerance.  pt has increased pain with shoulder flexion in  supine                 PT Education - 12/31/15 1728    Education provided Yes   Education Details remove kinesiotape if there is any skin irritation   Person(s) Educated Patient   Methods Explanation   Comprehension Verbalized understanding           Short Term Clinic Goals - 12/29/15 [place]      CC Short Term Goal  #1   Title Patient with verbalize an understanding of lymphedema risk reduction precautions   Status Achieved     CC Short Term Goal  #2   Title Patient will be independent in a basic  home exercise program   Status Achieved     CC Short Term Goal  #3   Title Patient will report a decrease in pain by 50% so they can perform daily activities with greater ease   Baseline 12/22/15- pt reports she has had a 40% decrease in pain since beginning therapy; 40% reduction with OH reaching, 50% improvement with behind the back reaching 12/29/15   Status Partially [place] - 12/29/15 1020      CC Long Term Goal  #1   Title Right shoulder active flexion to at least 145 degrees for improved overhead reach.   Baseline 120 on 11/18/2015, 133 on 11/26/2015, 132 degrees 12/03/15; 136 degrees 12/08/15, 12/22/15- 128 degrees; 140 degrees 12/29/15   Status On-going     CC Long Term Goal  #2   Title Right shoulder external rotation to 70 degrees. to allow for better shoulder abduction    Baseline 55 on 11/18/2015; 43 degrees 12/08/15, 12/22/15- 64 degrees; 76 degrees 12/29/15     CC Long Term Goal  #3   Title Left shoulder abduction to at least 150 degrees for improved ADLs.   Baseline 100 on 11/18/2015, 135 on 11/26/2015, 110 degrees 12/03/15; 116 degrees 12/08/15, 12/22/15- 116 degrees with some scaption; 126 degrees 12/29/15   Status On-going     CC Long Term Goal  #4   Title Patient will decrease the DASH score to < 30   to demonstrate increased functional use of upper extremity   Baseline 56.82 on 11/18/2015; 45.45 12/29/15   Status On-going     CC Long  Term Goal  #5   Title Patient will report a decrease in pain by 75% so they can perform daily activities with greater ease   Baseline 40% improvement reported 12/08/15, 12/22/15- 40% improvement noted; 50% reported with back reaching into car, 40% with forward reaching 12/29/15    Status On-going            Plan - 12/31/15 1728    Clinical Impression Statement Kinesiotape applied  today to see if this will help decrease edema in right upper back and trunk. Pt continuing to demonstrate decreased PROM in supine due to scapular compensation. Performed scapular mobilizations to help improve glenohumeral rhythm.    Clinical Impairments Affecting Rehab Potential previous chemo and right upper quadrant radiation    PT Frequency 2x / week   PT Duration 2 weeks   PT Treatment/Interventions ADLs/Self Care Home Management;Manual lymph drainage;Compression bandaging;Scar mobilization;Passive range of motion;Patient/family education;DME Instruction;Functional mobility training;Electrical Stimulation;Therapeutic activities;Therapeutic exercise;Manual techniques;Taping   PT Next Visit Plan assess if kinesiotape worked, continues PROM to increase R shoulder ROM and scapular mobilizations   Consulted and Agree with Plan of Care Patient      Patient will benefit from skilled therapeutic intervention in order to improve the following deficits and impairments:  Decreased range of motion, Increased fascial restricitons, Impaired UE functional use, Decreased activity tolerance, Decreased knowledge of precautions, Impaired perceived functional ability, Pain, Decreased knowledge of use of DME, Decreased strength, Increased edema, Postural dysfunction  Visit Diagnosis: Stiffness of right shoulder, not elsewhere classified  Pain in right shoulder  Localized swelling, mass and lump, right upper limb     Problem List Patient Active Problem List   Diagnosis Date Noted  . Port catheter in place 12/28/2015  .  Chemotherapy induced neutropenia ([place]) 05/14/2015  . 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 Tooth ache 01/09/2015  . Genetic testing 12/26/2014  . Family history of breast cancer   . Breast cancer of lower-inner quadrant of right female breast ([place]) 11/25/2014  . Migraine 11/19/2014    Alexia Freestone 12/31/2015, 5:32 PM  [place], [place], [address] Phone: [phone]   Fax:  [phone]  Name: DERINDA BARTUS MRN: [id number] Date of Birth: 1976-01-05   Allyson Sabal, PT 12/31/15 5:32 PM

## 2016-01-06 ENCOUNTER — Ambulatory Visit: Payer: 59 | Admitting: Physical Therapy

## 2016-01-06 DIAGNOSIS — M25611 Stiffness of right shoulder, not elsewhere classified: Secondary | ICD-10-CM | POA: Diagnosis not present

## 2016-01-06 DIAGNOSIS — M25511 Pain in right shoulder: Secondary | ICD-10-CM

## 2016-01-06 NOTE — Therapy (Signed)
[place], [place], [address] Phone: [phone]   Fax:  [phone]  Physical Therapy Treatment  Patient Details  Name: Susan Wilson MRN: [id number] Date of Birth: August 27, 1975 Referring Provider: Dr. Donne Hazel   Encounter Date: 01/06/2016      PT End of Session - 01/06/16 1209    Visit Number 14   Number of Visits 18   Date for PT Re-Evaluation 01/14/16   PT Start Time 0850   PT Stop Time 0934   PT Time Calculation (min) 44 min   Activity Tolerance Patient tolerated treatment well   Behavior During Therapy [facility] for tasks assessed/performed      Past Medical History:  Diagnosis Date  . Anemia   . 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 Back pain   . Breast cancer ([place])   . Breast cancer of lower-inner quadrant of right female breast ([place]) 11/25/2014  . Family history of breast cancer   . Headache   . Hernia, umbilical   . Hernia, umbilical     Past Surgical History:  Procedure Laterality Date  . BREAST RECONSTRUCTION Right 06/23/2015   Procedure: ONCOPLASTIC RIGHT BREAST REDUCTION;  Surgeon: Irene Limbo, MD;  Location: [place];  Service: Plastics;  Laterality: Right;  . BREAST REDUCTION SURGERY Left 06/23/2015   Procedure: MAMMARY REDUCTION  (BREAST) LEFT BREAST FOR SYMETRY (BILATERAL BREAST REDUCTION);  Surgeon: Irene Limbo, MD;  Location: [place];  Service: Plastics;  Laterality: Left;  . PORTACATH PLACEMENT Right 12/16/2014   Procedure: INSERTION PORT-A-CATH WITH ULTRASOUND;  Surgeon: Rolm Bookbinder, MD;  Location: [place];  Service: General;  Laterality: Right;  . RADIOACTIVE SEED GUIDED MASTECTOMY WITH AXILLARY SENTINEL LYMPH NODE BIOPSY Right 06/15/2015   Procedure: RADIOACTIVE SEED GUIDED PARTIAL MASTECTOMY WITH AXILLARY SENTINEL LYMPH NODE BIOPSY;  Surgeon: Rolm Bookbinder, MD;  Location: [place];  Service:  General;  Laterality: Right;    There were no vitals filed for this visit.      Subjective Assessment - 01/06/16 0853    Subjective "Kinesiotape did pretty good--I think it helped."  Therapy is helping with moving arm backward and forward/upward, but sideways it has only changed a little.  Has next surgery next Friday, Sept. 1st, for reduction and getting Port taken out.   Currently in Pain? Yes   Pain Score 3    Pain Location Shoulder   Pain Orientation Right   Pain Descriptors / Indicators Aching   Aggravating Factors  stormy weather   Pain Relieving Factors Arnica gel and ACE wrap            [facility] PT Assessment - 01/06/16 0001      AROM   Right Shoulder ABduction 105 Degrees                     [facility] Adult PT Treatment/Exercise - 01/06/16 0001      Shoulder Exercises: Standing   Other Standing Exercises mini squat, then lift yellow ball up overhead x 10 reps to strengthen core muscles.    Other Standing Exercises Wall pushups x 10; Standing with back aganist wall for bil UE flexion and scaption to 90 degrees with 2 lbs 10 times each.     Shoulder Exercises: Pulleys   Flexion 2 minutes   ABduction 2 minutes     Shoulder Exercises: Therapy Ball   ABduction 10 reps     Shoulder Exercises: Stretch  Other Shoulder Stretches Finger Ladder abduction Rt UE 10 times (up to 27) )     Manual Therapy   Edema Management kinesiotape applied in fan shape with well near right inguinals and fan extending up right side of back to right upper back   Scapular Mobilization in left sidelying to right scapular into protraction while moving arm into flexion  also mobilized scapula in all directions   Passive ROM In Supine to Rt shoulder into flexion, abduction and er all gently and to pts tolerance.  Also in left sidelying, right shoulder into flexion and into abduction.                   Short Term Clinic Goals - 12/29/15 1238      CC Short Term Goal  #1    Title Patient with verbalize an understanding of lymphedema risk reduction precautions   Status Achieved     CC Short Term Goal  #2   Title Patient will be independent in a basic  home exercise program   Status Achieved     CC Short Term Goal  #3   Title Patient will report a decrease in pain by 50% so they can perform daily activities with greater ease   Baseline 12/22/15- pt reports she has had a 40% decrease in pain since beginning therapy; 40% reduction with OH reaching, 50% improvement with behind the back reaching 12/29/15   Status Partially [place] - 12/29/15 1020      CC Long Term Goal  #1   Title Right shoulder active flexion to at least 145 degrees for improved overhead reach.   Baseline 120 on 11/18/2015, 133 on 11/26/2015, 132 degrees 12/03/15; 136 degrees 12/08/15, 12/22/15- 128 degrees; 140 degrees 12/29/15   Status On-going     CC Long Term Goal  #2   Title Right shoulder external rotation to 70 degrees. to allow for better shoulder abduction    Baseline 55 on 11/18/2015; 43 degrees 12/08/15, 12/22/15- 64 degrees; 76 degrees 12/29/15     CC Long Term Goal  #3   Title Left shoulder abduction to at least 150 degrees for improved ADLs.   Baseline 100 on 11/18/2015, 135 on 11/26/2015, 110 degrees 12/03/15; 116 degrees 12/08/15, 12/22/15- 116 degrees with some scaption; 126 degrees 12/29/15   Status On-going     CC Long Term Goal  #4   Title Patient will decrease the DASH score to < 30   to demonstrate increased functional use of upper extremity   Baseline 56.82 on 11/18/2015; 45.45 12/29/15   Status On-going     CC Long Term Goal  #5   Title Patient will report a decrease in pain by 75% so they can perform daily activities with greater ease   Baseline 40% improvement reported 12/08/15, 12/22/15- 40% improvement noted; 50% reported with back reaching into car, 40% with forward reaching 12/29/15    Status On-going            Plan - 01/06/16 1209    Clinical  Impression Statement Patient felt the kinesiotape done last time helped her, so this was applied again today.  She still has limited right shoulder PROM and limited tolerance for stretching.  Measurements of active abduction was decreased today, but patient reported feeling more aching due to weather today.   Rehab Potential Good   Clinical Impairments Affecting Rehab Potential previous chemo  and right upper quadrant radiation    PT Frequency 2x / week   PT Duration 3 weeks   PT Treatment/Interventions ADLs/Self Care Home Management;Manual lymph drainage;Compression bandaging;Scar mobilization;Passive range of motion;Patient/family education;DME Instruction;Functional mobility training;Electrical Stimulation;Therapeutic activities;Therapeutic exercise;Manual techniques;Taping   PT Next Visit Plan continue manual work to increase right shoulder ROM   Consulted and Agree with Plan of Care Patient      Patient will benefit from skilled therapeutic intervention in order to improve the following deficits and impairments:  Decreased range of motion, Increased fascial restricitons, Impaired UE functional use, Decreased activity tolerance, Decreased knowledge of precautions, Impaired perceived functional ability, Pain, Decreased knowledge of use of DME, Decreased strength, Increased edema, Postural dysfunction  Visit Diagnosis: Stiffness of right shoulder, not elsewhere classified  Pain in right shoulder     Problem List Patient Active Problem List   Diagnosis Date Noted  . Port catheter in place 12/28/2015  . Chemotherapy induced neutropenia ([place]) 05/14/2015  . Chemotherapy-induced neuropathy ([place]) 04/08/2015  . 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 Tooth ache 01/09/2015  . Genetic testing 12/26/2014  . Family history of breast cancer   . Breast cancer of lower-inner quadrant of right female breast ([place]) 11/25/2014  . Migraine 11/19/2014    Fraya Ueda 01/06/2016, 12:11  PM  [place], [place], [address] Phone: [phone]   Fax:  [phone]  Name: Susan Wilson MRN: [id number] Date of Birth: 12/12/1975   Serafina Royals, PT 01/06/16 12:12 PM

## 2016-01-08 ENCOUNTER — Ambulatory Visit: Payer: 59 | Admitting: Physical Therapy

## 2016-01-08 DIAGNOSIS — M25611 Stiffness of right shoulder, not elsewhere classified: Secondary | ICD-10-CM

## 2016-01-08 DIAGNOSIS — M25511 Pain in right shoulder: Secondary | ICD-10-CM

## 2016-01-08 DIAGNOSIS — R293 Abnormal posture: Secondary | ICD-10-CM

## 2016-01-08 DIAGNOSIS — R2231 Localized swelling, mass and lump, right upper limb: Secondary | ICD-10-CM

## 2016-01-08 NOTE — Therapy (Signed)
[place], [place], [address] Phone: [phone]   Fax:  [phone]  Physical Therapy Treatment  Patient Details  Name: Susan Wilson MRN: [id number] Date of Birth: 24-May-1975 Referring Provider: Dr. Donne Hazel   Encounter Date: 01/08/2016      PT End of Session - 01/08/16 1252    Visit Number 15   Number of Visits 18   Date for PT Re-Evaluation 01/14/16   PT Start Time 2423   PT Stop Time 1100   PT Time Calculation (min) 45 min   Activity Tolerance Patient tolerated treatment well   Behavior During Therapy [facility] for tasks assessed/performed      Past Medical History:  Diagnosis Date  . Anemia   . 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 Back pain   . Breast cancer ([place])   . Breast cancer of lower-inner quadrant of right female breast ([place]) 11/25/2014  . Family history of breast cancer   . Headache   . Hernia, umbilical   . Hernia, umbilical     Past Surgical History:  Procedure Laterality Date  . BREAST RECONSTRUCTION Right 06/23/2015   Procedure: ONCOPLASTIC RIGHT BREAST REDUCTION;  Surgeon: Irene Limbo, MD;  Location: [place];  Service: Plastics;  Laterality: Right;  . BREAST REDUCTION SURGERY Left 06/23/2015   Procedure: MAMMARY REDUCTION  (BREAST) LEFT BREAST FOR SYMETRY (BILATERAL BREAST REDUCTION);  Surgeon: Irene Limbo, MD;  Location: [place];  Service: Plastics;  Laterality: Left;  . PORTACATH PLACEMENT Right 12/16/2014   Procedure: INSERTION PORT-A-CATH WITH ULTRASOUND;  Surgeon: Rolm Bookbinder, MD;  Location: [place];  Service: General;  Laterality: Right;  . RADIOACTIVE SEED GUIDED MASTECTOMY WITH AXILLARY SENTINEL LYMPH NODE BIOPSY Right 06/15/2015   Procedure: RADIOACTIVE SEED GUIDED PARTIAL MASTECTOMY WITH AXILLARY SENTINEL LYMPH NODE BIOPSY;  Surgeon: Rolm Bookbinder, MD;  Location: [place];  Service:  General;  Laterality: Right;    There were no vitals filed for this visit.      Subjective Assessment - 01/08/16 1024    Subjective Pt feels that the kinesoitape with the swelling. Next Friday she will have port taken out and with reduction to left side and she will have 2 weeks of reduced activity after that    Pertinent History right lumpectomy with 6 nodes removed end of january 2017,and had breast reduction on both sides, she will have another on the left side schedules for september.  had chemotherapy before surgery and completed radiation June 5,2017    Patient Stated Goals to be able to move my arm around (rotate ) with less pain    Currently in Pain? No/denies               LYMPHEDEMA/ONCOLOGY QUESTIONNAIRE - 01/08/16 1031      Right Upper Extremity Lymphedema   15 cm Proximal to Olecranon Process 40.8 cm   10 cm Proximal to Olecranon Process 39.5 cm   Olecranon Process 29.5 cm   15 cm Proximal to Ulnar Styloid Process 31 cm   10 cm Proximal to Ulnar Styloid Process 27.5 cm   Just Proximal to Ulnar Styloid Process 18.5 cm   Across Hand at [employer] 20 cm   At [place] of 2nd Digit 6.5 cm                  [facility] Adult PT Treatment/Exercise - 01/08/16 0001      Manual Therapy   Edema  Management removed kinesiotape and did not reapply    Manual Lymphatic Drainage (MLD) In supine, short neck, superficial abdominals, right inguinal nodes, right axillo-inguinal anastomosis, left axillary nodes, anterior interaxillay anastamosis avoiding port on right chest, right anterior and posterior shoulder upper arm forearm and hand and returning along pathways    Passive ROM In Supine to Rt shoulder into flexion, abduction and er all gently and to pts tolerance.  Also in left sidelying, right shoulder into flexion and into abduction.                PT Education - 01/08/16 1251    Education provided Yes   Education Details go to a special place or [place]  [facility] for a compression    Person(s) Educated Patient   Methods Explanation;Handout;Demonstration   Comprehension Verbalized understanding           Short Term Clinic Goals - 12/29/15 [place]      CC Short Term Goal  #1   Title Patient with verbalize an understanding of lymphedema risk reduction precautions   Status Achieved     CC Short Term Goal  #2   Title Patient will be independent in a basic  home exercise program   Status Achieved     CC Short Term Goal  #3   Title Patient will report a decrease in pain by 50% so they can perform daily activities with greater ease   Baseline 12/22/15- pt reports she has had a 40% decrease in pain since beginning therapy; 40% reduction with OH reaching, 50% improvement with behind the back reaching 12/29/15   Status Partially [place] - 12/29/15 1020      CC Long Term Goal  #1   Title Right shoulder active flexion to at least 145 degrees for improved overhead reach.   Baseline 120 on 11/18/2015, 133 on 11/26/2015, 132 degrees 12/03/15; 136 degrees 12/08/15, 12/22/15- 128 degrees; 140 degrees 12/29/15   Status On-going     CC Long Term Goal  #2   Title Right shoulder external rotation to 70 degrees. to allow for better shoulder abduction    Baseline 55 on 11/18/2015; 43 degrees 12/08/15, 12/22/15- 64 degrees; 76 degrees 12/29/15     CC Long Term Goal  #3   Title Left shoulder abduction to at least 150 degrees for improved ADLs.   Baseline 100 on 11/18/2015, 135 on 11/26/2015, 110 degrees 12/03/15; 116 degrees 12/08/15, 12/22/15- 116 degrees with some scaption; 126 degrees 12/29/15   Status On-going     CC Long Term Goal  #4   Title Patient will decrease the DASH score to < 30   to demonstrate increased functional use of upper extremity   Baseline 56.82 on 11/18/2015; 45.45 12/29/15   Status On-going     CC Long Term Goal  #5   Title Patient will report a decrease in pain by 75% so they can perform daily activities with  greater ease   Baseline 40% improvement reported 12/08/15, 12/22/15- 40% improvement noted; 50% reported with back reaching into car, 40% with forward reaching 12/29/15    Status On-going            Plan - 01/08/16 1252    Clinical Impression Statement Pt has had some reduction in right arm is within 1 cm of left arm circumference. Feel she is ready for compression sleeve and glove and she was given information  about where to go.  She continues to have decrease in external rotation and abduction range of motion    Rehab Potential Good   Clinical Impairments Affecting Rehab Potential previous chemo and right upper quadrant radiation    PT Treatment/Interventions ADLs/Self Care Home Management;Manual lymph drainage;Compression bandaging;Scar mobilization;Passive range of motion;Patient/family education;DME Instruction;Functional mobility training;Electrical Stimulation;Therapeutic activities;Therapeutic exercise;Manual techniques;Taping   PT Next Visit Plan continue manual work to increase right shoulder ROM especially in external rotation and abduction    Consulted and Agree with Plan of Care Patient      Patient will benefit from skilled therapeutic intervention in order to improve the following deficits and impairments:  Decreased range of motion, Increased fascial restricitons, Impaired UE functional use, Decreased activity tolerance, Decreased knowledge of precautions, Impaired perceived functional ability, Pain, Decreased knowledge of use of DME, Decreased strength, Increased edema, Postural dysfunction  Visit Diagnosis: Stiffness of right shoulder, not elsewhere classified  Pain in right shoulder  Localized swelling, mass and lump, right upper limb  Abnormal posture     Problem List Patient Active Problem List   Diagnosis Date Noted  . Port catheter in place 12/28/2015  . Chemotherapy induced neutropenia ([place]) 05/14/2015  . Chemotherapy-induced neuropathy ([place]) 04/08/2015  .  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 Tooth ache 01/09/2015  . Genetic testing 12/26/2014  . Family history of breast cancer   . Breast cancer of lower-inner quadrant of right female breast ([place]) 11/25/2014  . Migraine 11/19/2014   Donato Heinz. Owens Shark PT  [place] Levo 01/08/2016, 12:57 PM  [place], [place], [address] Phone: [phone]   Fax:  [phone]  Name: BAYLEY YARBOROUGH MRN: [id number] Date of Birth: [birth year]

## 2016-01-11 ENCOUNTER — Encounter: Payer: Self-pay | Admitting: Radiation Oncology

## 2016-01-11 ENCOUNTER — Ambulatory Visit
Admission: RE | Admit: 2016-01-11 | Discharge: 2016-01-11 | Disposition: A | Discharge status: Home / Self Care | Payer: 59 | Source: Ambulatory Visit | Attending: Radiation Oncology | Admitting: Radiation Oncology

## 2016-01-11 VITALS — BP 124/87 | HR 77 | Resp 16 | Wt 237.0 lb

## 2016-01-11 DIAGNOSIS — C50311 Malignant neoplasm of lower-inner quadrant of right female breast: Secondary | ICD-10-CM

## 2016-01-11 DIAGNOSIS — Y842 Radiological procedure and radiotherapy as the cause of abnormal reaction of the patient, or of later complication, without mention of misadventure at the time of the procedure: Secondary | ICD-10-CM | POA: Diagnosis not present

## 2016-01-11 NOTE — Progress Notes (Signed)
Weight and vitals stable. Reports occasional brief sharp shooting pain in her right breast but, understands this is related to healing from breast surgery. Physical therapy for lymphedema ends this week because she is scheduled for surgery to have a reduction and her port removed. Reports her energy level is gradually improving. Reports faint hyperpigmentation of breast and upper back. Confirms she continues to use coco butter and vitamin e. Reports intermittent nausea. Denies nipple discharge. Scheduled to follow up with Dr. Jana Hakim on 04/21/16 and survivorship 02/08/16. 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  BP 124/87 (BP Location: Left Arm, Patient Position: Sitting, Cuff Size: Normal)   Pulse 77   Resp 16   Wt 237 lb (107.5 kg)   SpO2 100%   BMI 39.44 kg/m  Wt Readings from Last 3 Encounters:  01/11/16 237 lb (107.5 kg)  12/03/15 234 lb 3.2 oz (106.2 kg)  10/13/15 234 lb 14.4 oz (106.5 kg)

## 2016-01-11 NOTE — Progress Notes (Signed)
Radiation Oncology         [phone] ________________________________  Name: ARDELL AARONSON MRN: [id number]  Date: 01/11/2016  DOB: 12/05/1975   Follow-Up Visit Note  CC: Donnamae Jude, MD  Rolm Bookbinder, MD  Diagnosis:   Breast cancer of lower-inner quadrant of right female breast [facility])   Staging form: Breast, AJCC 7th Edition     Clinical stage from 12/03/2014: Stage IIA (T2, N0, M0) - Unsigned       Staging comments: Staged at breast conference on 7.20.16    ICD-9-CM ICD-10-CM   1. Breast cancer of lower-inner quadrant of right female breast ([place]) 174.3 C50.311     Interval Since Last Radiation: 11 weeks, 09/01/2015- 10/19/2015  Narrative:  The patient returns today for routine follow-up.  Reports occasional brief sharp shooting pain in her right breat but, understands this is related to healing from breast surgery. Physical therapy for lymphedema ends this week because she is scheduled for surgery to have a reduction and her port removed. Reports her energy level is gradually improving. Reports faint hyperpigmentation of breast and upper back. Confirms she continues to use coco butter and vitamin e. Reports intermittent nausea. Denies nipple discharge. Scheduled to follow up with Dr. Jana Hakim on 04/21/16 and survivorship 02/08/16. 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 She reports skin sensitivity to sun in her right arm and chest. Complains of fluid in right arm and back.                             ALLERGIES:  has No Known Allergies.  Meds: Current Outpatient Prescriptions  Medication Sig Dispense Refill  . b complex vitamins tablet Take 1 tablet by mouth daily.    . ferrous sulfate 325 (65 FE) MG tablet Take 325 mg by mouth daily with breakfast.    . ibuprofen (ADVIL,MOTRIN) 200 MG tablet Take 400 mg by mouth every 6 (six) hours as needed (for pain). Reported on 05/14/2015    . LORazepam (ATIVAN) 0.5 MG tablet Take 1 tablet (0.5 mg total) by  mouth at bedtime. 30 tablet 0  . prochlorperazine (COMPAZINE) 10 MG tablet Take 1 tablet (10 mg total) by mouth every 6 (six) hours as needed for nausea or vomiting. 30 tablet 1  . SUMAtriptan (IMITREX) 50 MG tablet Take 1 tablet (50 mg total) by mouth every 2 (two) hours as needed for migraine. May repeat in 2 hours if headache persists or recurs. 10 tablet 0   No current facility-administered medications for this encounter.     Physical Findings: The patient is in no acute distress. Patient is alert and oriented.  weight is 237 lb (107.5 kg). Her blood pressure is 124/87 and her pulse is 77. Her respiration is 16 and oxygen saturation is 100%. .  No significant changes. Right breast is notable for some overall size reduction and lift. There is no residual radiation dermatitis. Patient may have trace lymphedema involving right arm.  Lab Findings: Lab Results  Component Value Date   WBC 3.6 (L) 12/03/2015   WBC 4.5 12/12/2014   HGB 11.9 12/03/2015   HCT 37.1 12/03/2015   PLT 225 12/03/2015    Lab Results  Component Value Date   NA 138 12/03/2015   K 4.1 12/03/2015   CHLORIDE 104 12/03/2015   CO2 26 12/03/2015   GLUCOSE 100 12/03/2015   BUN 12.0 12/03/2015   CREATININE 0.8 12/03/2015  BILITOT 0.81 12/03/2015   ALKPHOS 79 12/03/2015   AST 21 12/03/2015   ALT 26 12/03/2015   PROT 7.8 12/03/2015   ALBUMIN 4.0 12/03/2015   CALCIUM 9.4 12/03/2015   ANIONGAP 8 12/03/2015   ANIONGAP 9 12/12/2014    Radiographic Findings: No results found.  Impression:  The patient is recovering from the effects of radiation.   Plan:  Patient was prescribed a lymphedema sleeve. Patient has follow up care established with survivorship and medical oncology. She will follow up in radiation clinic on and as needed basis.  _____________________________________  Sheral Apley Tammi Klippel, M.D. This document serves as a record of services personally performed by Tyler Pita, MD. It was created on his  behalf by Bethann Humble, a trained medical scribe. The creation of this record is based on the scribe's personal observations and the provider's statements to them. This document has been checked and approved by the attending provider.

## 2016-01-12 ENCOUNTER — Encounter (HOSPITAL_BASED_OUTPATIENT_CLINIC_OR_DEPARTMENT_OTHER): Payer: Self-pay | Admitting: *Deleted

## 2016-01-12 ENCOUNTER — Ambulatory Visit: Payer: 59

## 2016-01-12 DIAGNOSIS — M25611 Stiffness of right shoulder, not elsewhere classified: Secondary | ICD-10-CM | POA: Diagnosis not present

## 2016-01-12 DIAGNOSIS — R2231 Localized swelling, mass and lump, right upper limb: Secondary | ICD-10-CM

## 2016-01-12 DIAGNOSIS — R293 Abnormal posture: Secondary | ICD-10-CM

## 2016-01-12 DIAGNOSIS — M25511 Pain in right shoulder: Secondary | ICD-10-CM

## 2016-01-12 NOTE — Therapy (Signed)
[place], [place], [address] Phone: [phone]   Fax:  [phone]  Physical Therapy Treatment  Patient Details  Name: Susan Wilson MRN: [id number] Date of Birth: [birth year] Referring Provider: Dr. Donne Hazel   Encounter Date: 01/12/2016      PT End of Session - 01/12/16 1021    Visit Number 16   Number of Visits 18   Date for PT Re-Evaluation 01/14/16   PT Start Time 0932   PT Stop Time 1020   PT Time Calculation (min) 48 min   Activity Tolerance Patient tolerated treatment well   Behavior During Therapy [facility] for tasks assessed/performed      Past Medical History:  Diagnosis Date  . Anemia   . 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 Back pain   . Breast cancer ([place])   . Breast cancer of lower-inner quadrant of right female breast ([place]) 11/25/2014  . Family history of breast cancer   . Headache   . Hernia, umbilical   . Hernia, umbilical     Past Surgical History:  Procedure Laterality Date  . BREAST RECONSTRUCTION Right 06/23/2015   Procedure: ONCOPLASTIC RIGHT BREAST REDUCTION;  Surgeon: Irene Limbo, MD;  Location: [place];  Service: Plastics;  Laterality: Right;  . BREAST REDUCTION SURGERY Left 06/23/2015   Procedure: MAMMARY REDUCTION  (BREAST) LEFT BREAST FOR SYMETRY (BILATERAL BREAST REDUCTION);  Surgeon: Irene Limbo, MD;  Location: [place];  Service: Plastics;  Laterality: Left;  . PORTACATH PLACEMENT Right 12/16/2014   Procedure: INSERTION PORT-A-CATH WITH ULTRASOUND;  Surgeon: Rolm Bookbinder, MD;  Location: [place];  Service: General;  Laterality: Right;  . RADIOACTIVE SEED GUIDED MASTECTOMY WITH AXILLARY SENTINEL LYMPH NODE BIOPSY Right 06/15/2015   Procedure: RADIOACTIVE SEED GUIDED PARTIAL MASTECTOMY WITH AXILLARY SENTINEL LYMPH NODE BIOPSY;  Surgeon: Rolm Bookbinder, MD;  Location: [place];  Service:  General;  Laterality: Right;    There were no vitals filed for this visit.      Subjective Assessment - 01/12/16 0934    Subjective My Rt arm and chest feels pretty achy today probably from the weather.    Pertinent History right lumpectomy with 6 nodes removed end of january 2017,and had breast reduction on both sides, she will have another on the left side schedules for september.  had chemotherapy before surgery and completed radiation June 5,2017    Patient Stated Goals to be able to move my arm around (rotate ) with less pain    Currently in Pain? Yes   Pain Score 2    Pain Location Arm   Pain Orientation Right;Upper   Pain Descriptors / Indicators Aching   Pain Type Chronic pain   Pain Radiating Towards into chest   Pain Onset More than a month ago   Pain Frequency Constant   Aggravating Factors  rainy weather   Pain Relieving Factors stretching                         [facility] Adult PT Treatment/Exercise - 01/12/16 0001      Manual Therapy   Edema Management kinesiotape applied in fanshape with base at Rt transverse line with fan extending up Rt side of back to Rt uper back and towards superior shoulder   Manual Lymphatic Drainage (MLD) In supine, short neck, superficial abdominals, right inguinal nodes, right axillo-inguinal anastomosis, left axillary nodes, anterior interaxillay anastamosis avoiding port on right  chest, right anterior and posterior shoulder upper arm forearm and hand and returning along pathways    Passive ROM In Supine to Rt shoulder into flexion, abduction and er all gently and to pts tolerance.                    Short Term Clinic Goals - 12/29/15 1238      CC Short Term Goal  #1   Title Patient with verbalize an understanding of lymphedema risk reduction precautions   Status Achieved     CC Short Term Goal  #2   Title Patient will be independent in a basic  home exercise program   Status Achieved     CC Short Term Goal   #3   Title Patient will report a decrease in pain by 50% so they can perform daily activities with greater ease   Baseline 12/22/15- pt reports she has had a 40% decrease in pain since beginning therapy; 40% reduction with OH reaching, 50% improvement with behind the back reaching 12/29/15   Status Partially Goessel - 12/29/15 1020      CC Long Term Goal  #1   Title Right shoulder active flexion to at least 145 degrees for improved overhead reach.   Baseline 120 on 11/18/2015, 133 on 11/26/2015, 132 degrees 12/03/15; 136 degrees 12/08/15, 12/22/15- 128 degrees; 140 degrees 12/29/15   Status On-going     CC Long Term Goal  #2   Title Right shoulder external rotation to 70 degrees. to allow for better shoulder abduction    Baseline 55 on 11/18/2015; 43 degrees 12/08/15, 12/22/15- 64 degrees; 76 degrees 12/29/15     CC Long Term Goal  #3   Title Left shoulder abduction to at least 150 degrees for improved ADLs.   Baseline 100 on 11/18/2015, 135 on 11/26/2015, 110 degrees 12/03/15; 116 degrees 12/08/15, 12/22/15- 116 degrees with some scaption; 126 degrees 12/29/15   Status On-going     CC Long Term Goal  #4   Title Patient will decrease the DASH score to < 30   to demonstrate increased functional use of upper extremity   Baseline 56.82 on 11/18/2015; 45.45 12/29/15   Status On-going     CC Long Term Goal  #5   Title Patient will report a decrease in pain by 75% so they can perform daily activities with greater ease   Baseline 40% improvement reported 12/08/15, 12/22/15- 40% improvement noted; 50% reported with back reaching into car, 40% with forward reaching 12/29/15    Status On-going            Plan - 01/12/16 1021    Clinical Impression Statement Pt tolerated stretching well today and her P/ROM has seemed to improve. Got my presrciption for my sleeve and will make an appointment to get measured hopefully before my surgeru if I can make it.    Rehab Potential Good    Clinical Impairments Affecting Rehab Potential previous chemo and right upper quadrant radiation    PT Frequency 2x / week   PT Duration 3 weeks   PT Treatment/Interventions ADLs/Self Care Home Management;Manual lymph drainage;Compression bandaging;Scar mobilization;Passive range of motion;Patient/family education;DME Instruction;Functional mobility training;Electrical Stimulation;Therapeutic activities;Therapeutic exercise;Manual techniques;Taping   PT Next Visit Plan Pt on hold for about 2 weeks (after this week) for surgery Friday to have port removed and Lt breast reduction. Renewal required after that.  Consulted and Agree with Plan of Care Patient      Patient will benefit from skilled therapeutic intervention in order to improve the following deficits and impairments:  Decreased range of motion, Increased fascial restricitons, Impaired UE functional use, Decreased activity tolerance, Decreased knowledge of precautions, Impaired perceived functional ability, Pain, Decreased knowledge of use of DME, Decreased strength, Increased edema, Postural dysfunction  Visit Diagnosis: Stiffness of right shoulder, not elsewhere classified  Pain in right shoulder  Localized swelling, mass and lump, right upper limb  Abnormal posture     Problem List Patient Active Problem List   Diagnosis Date Noted  . Port catheter in place 12/28/2015  . Chemotherapy induced neutropenia ([place]) 05/14/2015  . 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 Tooth ache 01/09/2015  . Genetic testing 12/26/2014  . Family history of breast cancer   . Breast cancer of lower-inner quadrant of right female breast ([place]) 11/25/2014  . Migraine 11/19/2014    Otelia Limes, PTA 01/12/2016, 10:26 AM  [place], [place], [address] Phone: [phone]   Fax:  [phone]  Name:  Susan Wilson MRN: [id number] Date of Birth: 12-07-1975

## 2016-01-12 NOTE — Addendum Note (Signed)
Encounter addended by: Heywood Footman, RN on: 01/12/2016 11:12 AM<BR>    Actions taken: Charge Capture section accepted

## 2016-01-13 ENCOUNTER — Ambulatory Visit: Payer: 59

## 2016-01-14 ENCOUNTER — Ambulatory Visit: Payer: 59 | Admitting: Physical Therapy

## 2016-01-14 NOTE — Anesthesia Preprocedure Evaluation (Addendum)
Anesthesia Evaluation  Patient identified by MRN, date of birth, ID band Patient awake    Reviewed: Allergy & Precautions, NPO status , Patient's Chart, lab work & pertinent test results  History of Anesthesia Complications Negative for: history of anesthetic complications  Airway Mallampati: II  TM Distance: >3 FB Neck ROM: Full    Dental no notable dental hx. (+) Dental Advisory Given   Pulmonary neg pulmonary ROS,    Pulmonary exam normal breath sounds clear to auscultation       Cardiovascular Exercise Tolerance: Good negative cardio ROS Normal cardiovascular exam Rhythm:Regular Rate:Normal  Study Conclusions  - Left ventricle: The cavity size was normal. Wall thickness was   normal. Systolic function was normal. The estimated ejection   fraction was in the range of 55% to 60%. - Aortic valve: There was mild regurgitation. - Mitral valve: Some cardiac beats MR looked more moderate but   poorly defined. There was mild regurgitation. - Left atrium: The atrium was mildly dilated.   Neuro/Psych  Headaches,  Neuromuscular disease negative psych ROS   GI/Hepatic negative GI ROS, Neg liver ROS,   Endo/Other  Morbid obesity  Renal/GU negative Renal ROS  negative genitourinary   Musculoskeletal negative musculoskeletal ROS (+)   Abdominal (+) + obese,   Peds negative pediatric ROS (+)  Hematology  (+) anemia ,   Anesthesia Other Findings   Reproductive/Obstetrics negative OB ROS                            Anesthesia Physical  Anesthesia Plan  ASA: II  Anesthesia Plan: General   Post-op Pain Management:    Induction: Intravenous  Airway Management Planned: Oral ETT  Additional Equipment:   Intra-op Plan:   Post-operative Plan: Extubation in OR  Informed Consent: I have reviewed the patients History and Physical, chart, labs and discussed the procedure including the risks,  benefits and alternatives for the proposed anesthesia with the patient or authorized representative who has indicated his/her understanding and acceptance.   Dental advisory given  Plan Discussed with: CRNA and Anesthesiologist  Anesthesia Plan Comments: (Very difficult IV access. )       Anesthesia Quick Evaluation

## 2016-01-15 ENCOUNTER — Encounter (HOSPITAL_BASED_OUTPATIENT_CLINIC_OR_DEPARTMENT_OTHER)
Admission: RE | Disposition: A | Discharge status: Home / Self Care | Payer: Self-pay | Source: Ambulatory Visit | Attending: Plastic Surgery

## 2016-01-15 ENCOUNTER — Ambulatory Visit (HOSPITAL_BASED_OUTPATIENT_CLINIC_OR_DEPARTMENT_OTHER): Payer: 59 | Admitting: Anesthesiology

## 2016-01-15 ENCOUNTER — Ambulatory Visit (HOSPITAL_BASED_OUTPATIENT_CLINIC_OR_DEPARTMENT_OTHER)
Admission: RE | Admit: 2016-01-15 | Discharge: 2016-01-15 | Disposition: A | Discharge status: Home / Self Care | Payer: 59 | Source: Ambulatory Visit | Attending: Plastic Surgery | Admitting: Plastic Surgery

## 2016-01-15 ENCOUNTER — Encounter (HOSPITAL_BASED_OUTPATIENT_CLINIC_OR_DEPARTMENT_OTHER): Payer: Self-pay

## 2016-01-15 DIAGNOSIS — G629 Polyneuropathy, unspecified: Secondary | ICD-10-CM | POA: Diagnosis not present

## 2016-01-15 DIAGNOSIS — Z9221 Personal history of antineoplastic chemotherapy: Secondary | ICD-10-CM | POA: Diagnosis not present

## 2016-01-15 DIAGNOSIS — Z853 Personal history of malignant neoplasm of breast: Secondary | ICD-10-CM | POA: Insufficient documentation

## 2016-01-15 DIAGNOSIS — N6489 Other specified disorders of breast: Secondary | ICD-10-CM | POA: Diagnosis present

## 2016-01-15 DIAGNOSIS — Z6838 Body mass index (BMI) 38.0-38.9, adult: Secondary | ICD-10-CM | POA: Insufficient documentation

## 2016-01-15 HISTORY — PX: MASTOPEXY: SHX5358

## 2016-01-15 HISTORY — PX: PORT-A-CATH REMOVAL: SHX5289

## 2016-01-15 HISTORY — DX: Personal history of malignant neoplasm of breast: Z85.3

## 2016-01-15 HISTORY — DX: Personal history of antineoplastic chemotherapy: Z92.21

## 2016-01-15 HISTORY — PX: BREAST REDUCTION SURGERY: SHX8

## 2016-01-15 HISTORY — DX: Other specified disorders of breast: N64.89

## 2016-01-15 HISTORY — DX: Drug-induced polyneuropathy: G62.0

## 2016-01-15 HISTORY — DX: Adverse effect of antineoplastic and immunosuppressive drugs, initial encounter: T45.1X5A

## 2016-01-15 HISTORY — DX: Migraine, unspecified, not intractable, without status migrainosus: G43.909

## 2016-01-15 SURGERY — MAMMOPLASTY, REDUCTION
Anesthesia: General | Laterality: Right

## 2016-01-15 MED ORDER — SCOPOLAMINE 1 MG/3DAYS TD PT72: 1.0000 | MEDICATED_PATCH | TRANSDERMAL | Status: DC

## 2016-01-15 MED ORDER — MIDAZOLAM HCL 2 MG/2ML IJ SOLN
INTRAMUSCULAR | Status: AC
  Filled 2016-01-15: qty 2

## 2016-01-15 MED ORDER — CEFAZOLIN SODIUM-DEXTROSE 2-4 GM/100ML-% IV SOLN
2.0000 g | INTRAVENOUS | Status: AC
  Administered 2016-01-15: 2 g via INTRAVENOUS

## 2016-01-15 MED ORDER — CELECOXIB 200 MG PO CAPS
ORAL_CAPSULE | ORAL | Status: AC
Start: 2016-01-15 — End: 2016-01-15
  Filled 2016-01-15: qty 2

## 2016-01-15 MED ORDER — MORPHINE SULFATE (PF) 10 MG/ML IV SOLN
INTRAVENOUS | Status: AC
  Filled 2016-01-15: qty 1

## 2016-01-15 MED ORDER — FENTANYL CITRATE (PF) 100 MCG/2ML IJ SOLN
INTRAMUSCULAR | Status: AC
  Filled 2016-01-15: qty 2

## 2016-01-15 MED ORDER — HYDROMORPHONE HCL 1 MG/ML IJ SOLN
INTRAMUSCULAR | Status: AC
  Filled 2016-01-15: qty 1

## 2016-01-15 MED ORDER — LIDOCAINE-EPINEPHRINE (PF) 1 %-1:200000 IJ SOLN
INTRAMUSCULAR | Status: AC
  Filled 2016-01-15: qty 60

## 2016-01-15 MED ORDER — FENTANYL CITRATE (PF) 100 MCG/2ML IJ SOLN
50.0000 ug | INTRAMUSCULAR | Status: DC | PRN
  Administered 2016-01-15: 100 ug via INTRAVENOUS

## 2016-01-15 MED ORDER — SCOPOLAMINE 1 MG/3DAYS TD PT72: 1.0000 | MEDICATED_PATCH | Freq: Once | TRANSDERMAL | Status: DC | PRN

## 2016-01-15 MED ORDER — LACTATED RINGERS IV SOLN
INTRAVENOUS | Status: DC
  Administered 2016-01-15: 08:00:00 via INTRAVENOUS

## 2016-01-15 MED ORDER — PROMETHAZINE HCL 25 MG/ML IJ SOLN: 6.2500 mg | INTRAMUSCULAR | Status: DC | PRN

## 2016-01-15 MED ORDER — ONDANSETRON HCL 4 MG/2ML IJ SOLN
INTRAMUSCULAR | Status: DC | PRN
  Administered 2016-01-15: 4 mg via INTRAVENOUS

## 2016-01-15 MED ORDER — GLYCOPYRROLATE 0.2 MG/ML IJ SOLN: 0.2000 mg | Freq: Once | INTRAMUSCULAR | Status: DC | PRN

## 2016-01-15 MED ORDER — 0.9 % SODIUM CHLORIDE (POUR BTL) OPTIME
TOPICAL | Status: DC | PRN
  Administered 2016-01-15: 1000 mL

## 2016-01-15 MED ORDER — OXYCODONE HCL 5 MG PO TABS
5.0000 mg | ORAL_TABLET | Freq: Once | ORAL | Status: AC
  Administered 2016-01-15: 5 mg via ORAL

## 2016-01-15 MED ORDER — ACETAMINOPHEN 500 MG PO TABS
ORAL_TABLET | ORAL | Status: AC
  Filled 2016-01-15: qty 2

## 2016-01-15 MED ORDER — SUCCINYLCHOLINE CHLORIDE 20 MG/ML IJ SOLN
INTRAMUSCULAR | Status: DC | PRN
  Administered 2016-01-15: 120 mg via INTRAVENOUS

## 2016-01-15 MED ORDER — GABAPENTIN 300 MG PO CAPS
ORAL_CAPSULE | ORAL | Status: AC
Start: 2016-01-15 — End: 2016-01-15
  Filled 2016-01-15: qty 1

## 2016-01-15 MED ORDER — BUPIVACAINE-EPINEPHRINE 0.25% -1:200000 IJ SOLN
INTRAMUSCULAR | Status: DC | PRN
  Administered 2016-01-15: 20 mL

## 2016-01-15 MED ORDER — PROPOFOL 500 MG/50ML IV EMUL
INTRAVENOUS | Status: AC
  Filled 2016-01-15: qty 50

## 2016-01-15 MED ORDER — MORPHINE SULFATE 10 MG/ML IJ SOLN
INTRAMUSCULAR | Status: DC | PRN
  Administered 2016-01-15 (×3): 2 mg via INTRAVENOUS

## 2016-01-15 MED ORDER — SUCCINYLCHOLINE CHLORIDE 200 MG/10ML IV SOSY
PREFILLED_SYRINGE | INTRAVENOUS | Status: AC
  Filled 2016-01-15: qty 10

## 2016-01-15 MED ORDER — LIDOCAINE 2% (20 MG/ML) 5 ML SYRINGE
INTRAMUSCULAR | Status: AC
  Filled 2016-01-15: qty 5

## 2016-01-15 MED ORDER — PROPOFOL 10 MG/ML IV BOLUS
INTRAVENOUS | Status: DC | PRN
  Administered 2016-01-15: 250 mg via INTRAVENOUS

## 2016-01-15 MED ORDER — HYDROMORPHONE HCL 1 MG/ML IJ SOLN
0.2500 mg | INTRAMUSCULAR | Status: DC | PRN
  Administered 2016-01-15 (×4): 0.5 mg via INTRAVENOUS

## 2016-01-15 MED ORDER — LIDOCAINE HCL (CARDIAC) 20 MG/ML IV SOLN: INTRAVENOUS | Status: DC | PRN

## 2016-01-15 MED ORDER — GABAPENTIN 300 MG PO CAPS
300.0000 mg | ORAL_CAPSULE | ORAL | Status: AC
  Administered 2016-01-15: 300 mg via ORAL

## 2016-01-15 MED ORDER — MIDAZOLAM HCL 2 MG/2ML IJ SOLN
1.0000 mg | INTRAMUSCULAR | Status: DC | PRN
  Administered 2016-01-15: 2 mg via INTRAVENOUS

## 2016-01-15 MED ORDER — LIDOCAINE 5 % EX OINT
TOPICAL_OINTMENT | CUTANEOUS | Status: AC
Start: 2016-01-15 — End: 2016-01-15
  Filled 2016-01-15: qty 35.44

## 2016-01-15 MED ORDER — DEXAMETHASONE SODIUM PHOSPHATE 10 MG/ML IJ SOLN
INTRAMUSCULAR | Status: AC
  Filled 2016-01-15: qty 1

## 2016-01-15 MED ORDER — SODIUM CHLORIDE 0.9 % IJ SOLN
INTRAMUSCULAR | Status: AC
  Filled 2016-01-15: qty 30

## 2016-01-15 MED ORDER — DEXAMETHASONE SODIUM PHOSPHATE 4 MG/ML IJ SOLN
INTRAMUSCULAR | Status: DC | PRN
  Administered 2016-01-15: 10 mg via INTRAVENOUS

## 2016-01-15 MED ORDER — OXYCODONE HCL 5 MG PO TABS
ORAL_TABLET | ORAL | Status: AC
  Filled 2016-01-15: qty 1

## 2016-01-15 MED ORDER — ONDANSETRON HCL 4 MG/2ML IJ SOLN
INTRAMUSCULAR | Status: AC
  Filled 2016-01-15: qty 2

## 2016-01-15 MED ORDER — OXYCODONE-ACETAMINOPHEN 5-325 MG PO TABS: 1.0000 | ORAL_TABLET | ORAL | 0 refills | Status: DC | PRN

## 2016-01-15 MED ORDER — CHLORHEXIDINE GLUCONATE CLOTH 2 % EX PADS: 6.0000 | MEDICATED_PAD | Freq: Once | CUTANEOUS | Status: DC

## 2016-01-15 MED ORDER — CELECOXIB 400 MG PO CAPS
400.0000 mg | ORAL_CAPSULE | ORAL | Status: AC
  Administered 2016-01-15: 400 mg via ORAL

## 2016-01-15 MED ORDER — ACETAMINOPHEN 500 MG PO TABS
1000.0000 mg | ORAL_TABLET | ORAL | Status: AC
  Administered 2016-01-15: 1000 mg via ORAL

## 2016-01-15 MED ORDER — CEFAZOLIN SODIUM-DEXTROSE 2-4 GM/100ML-% IV SOLN
INTRAVENOUS | Status: AC
  Filled 2016-01-15: qty 100

## 2016-01-15 MED ORDER — BUPIVACAINE-EPINEPHRINE (PF) 0.25% -1:200000 IJ SOLN
INTRAMUSCULAR | Status: AC
Start: 2016-01-15 — End: 2016-01-15
  Filled 2016-01-15: qty 60

## 2016-01-15 SURGICAL SUPPLY — 71 items
APPLIER CLIP 9.375 MED OPEN (MISCELLANEOUS)
BAG DECANTER FOR FLEXI CONT (MISCELLANEOUS) ×4 IMPLANT
BENZOIN TINCTURE PRP APPL 2/3 (GAUZE/BANDAGES/DRESSINGS) IMPLANT
BINDER BREAST 3XL (BIND) IMPLANT
BINDER BREAST LRG (GAUZE/BANDAGES/DRESSINGS) IMPLANT
BINDER BREAST MEDIUM (GAUZE/BANDAGES/DRESSINGS) IMPLANT
BINDER BREAST XLRG (GAUZE/BANDAGES/DRESSINGS) IMPLANT
BINDER BREAST XXLRG (GAUZE/BANDAGES/DRESSINGS) ×4 IMPLANT
BLADE CLIPPER SURG (BLADE) IMPLANT
BLADE SURG 10 STRL SS (BLADE) ×16 IMPLANT
BLADE SURG 15 STRL LF DISP TIS (BLADE) ×2 IMPLANT
BLADE SURG 15 STRL SS (BLADE) ×2
BNDG GAUZE ELAST 4 BULKY (GAUZE/BANDAGES/DRESSINGS) ×8 IMPLANT
CANISTER SUCT 1200ML W/VALVE (MISCELLANEOUS) ×4 IMPLANT
CHLORAPREP W/TINT 26ML (MISCELLANEOUS) ×8 IMPLANT
CLIP APPLIE 9.375 MED OPEN (MISCELLANEOUS) IMPLANT
CLOSURE STERI-STRIP 1/2X4 (GAUZE/BANDAGES/DRESSINGS) ×1
CLOSURE WOUND 1/2 X4 (GAUZE/BANDAGES/DRESSINGS) ×1
CLSR STERI-STRIP ANTIMIC 1/2X4 (GAUZE/BANDAGES/DRESSINGS) ×3 IMPLANT
COVER BACK TABLE 60X90IN (DRAPES) ×4 IMPLANT
COVER MAYO STAND STRL (DRAPES) ×4 IMPLANT
DECANTER SPIKE VIAL GLASS SM (MISCELLANEOUS) IMPLANT
DRAIN CHANNEL 15F RND FF W/TCR (WOUND CARE) ×4 IMPLANT
DRAIN CHANNEL 19F RND (DRAIN) IMPLANT
DRAPE LAPAROTOMY 100X72 PEDS (DRAPES) IMPLANT
DRSG PAD ABDOMINAL 8X10 ST (GAUZE/BANDAGES/DRESSINGS) ×8 IMPLANT
DRSG TEGADERM 2-3/8X2-3/4 SM (GAUZE/BANDAGES/DRESSINGS) IMPLANT
ELECT BLADE 4.0 EZ CLEAN MEGAD (MISCELLANEOUS)
ELECT COATED BLADE 2.86 ST (ELECTRODE) ×4 IMPLANT
ELECT REM PT RETURN 9FT ADLT (ELECTROSURGICAL) ×4
ELECTRODE BLDE 4.0 EZ CLN MEGD (MISCELLANEOUS) IMPLANT
ELECTRODE REM PT RTRN 9FT ADLT (ELECTROSURGICAL) ×2 IMPLANT
EVACUATOR SILICONE 100CC (DRAIN) ×4 IMPLANT
GAUZE SPONGE 4X4 12PLY STRL (GAUZE/BANDAGES/DRESSINGS) IMPLANT
GLOVE BIO SURGEON STRL SZ 6 (GLOVE) ×8 IMPLANT
GOWN STRL REUS W/ TWL LRG LVL3 (GOWN DISPOSABLE) ×4 IMPLANT
GOWN STRL REUS W/TWL LRG LVL3 (GOWN DISPOSABLE) ×4
LIQUID BAND (GAUZE/BANDAGES/DRESSINGS) ×8 IMPLANT
NEEDLE HYPO 25X1 1.5 SAFETY (NEEDLE) ×4 IMPLANT
NEEDLE HYPO 30GX1 BEV (NEEDLE) IMPLANT
NEEDLE PRECISIONGLIDE 27X1.5 (NEEDLE) ×4 IMPLANT
NS IRRIG 1000ML POUR BTL (IV SOLUTION) ×4 IMPLANT
PACK BASIN DAY SURGERY FS (CUSTOM PROCEDURE TRAY) ×4 IMPLANT
PACK UNIVERSAL I (CUSTOM PROCEDURE TRAY) ×4 IMPLANT
PENCIL BUTTON HOLSTER BLD 10FT (ELECTRODE) ×4 IMPLANT
PIN SAFETY STERILE (MISCELLANEOUS) ×4 IMPLANT
SHEET MEDIUM DRAPE 40X70 STRL (DRAPES) ×8 IMPLANT
SLEEVE SCD COMPRESS KNEE MED (MISCELLANEOUS) ×4 IMPLANT
SPONGE GAUZE 2X2 8PLY STER LF (GAUZE/BANDAGES/DRESSINGS)
SPONGE GAUZE 2X2 8PLY STRL LF (GAUZE/BANDAGES/DRESSINGS) IMPLANT
SPONGE GAUZE 4X4 12PLY STER LF (GAUZE/BANDAGES/DRESSINGS) IMPLANT
SPONGE LAP 18X18 X RAY DECT (DISPOSABLE) ×16 IMPLANT
STAPLER VISISTAT 35W (STAPLE) ×8 IMPLANT
STRIP CLOSURE SKIN 1/2X4 (GAUZE/BANDAGES/DRESSINGS) ×3 IMPLANT
SUT ETHILON 2 0 FS 18 (SUTURE) ×4 IMPLANT
SUT MNCRL AB 4-0 PS2 18 (SUTURE) ×16 IMPLANT
SUT VIC AB 3-0 PS1 18 (SUTURE) ×10
SUT VIC AB 3-0 PS1 18XBRD (SUTURE) ×10 IMPLANT
SUT VIC AB 3-0 SH 27 (SUTURE)
SUT VIC AB 3-0 SH 27X BRD (SUTURE) IMPLANT
SUT VICRYL 4-0 PS2 18IN ABS (SUTURE) ×8 IMPLANT
SYR BULB 3OZ (MISCELLANEOUS) IMPLANT
SYR BULB IRRIGATION 50ML (SYRINGE) ×4 IMPLANT
SYR CONTROL 10ML LL (SYRINGE) ×4 IMPLANT
TAPE MEASURE VINYL STERILE (MISCELLANEOUS) ×4 IMPLANT
TOWEL OR 17X24 6PK STRL BLUE (TOWEL DISPOSABLE) ×8 IMPLANT
TRAY DSU PREP LF (CUSTOM PROCEDURE TRAY) IMPLANT
TUBE CONNECTING 20'X1/4 (TUBING) ×1
TUBE CONNECTING 20X1/4 (TUBING) ×3 IMPLANT
UNDERPAD 30X30 (UNDERPADS AND DIAPERS) ×8 IMPLANT
YANKAUER SUCT BULB TIP NO VENT (SUCTIONS) ×4 IMPLANT

## 2016-01-15 NOTE — Op Note (Signed)
Operative Note   DATE OF OPERATION: 9.1.17  LOCATION: [facility]  SURGICAL DIVISION: Plastic Surgery  PREOPERATIVE DIAGNOSES:  1. History right breast cancer 2. History oncoplastic reduction 3. History therapeutic radiation 4. Acquired asymmetry breasts  POSTOPERATIVE DIAGNOSES:  same  PROCEDURE:  1. Removal right chest port 2 Left breast reduction for symmetry  SURGEON: Irene Limbo MD MBA  ASSISTANT:none  ANESTHESIA:  General.   EBL: 840 ml  COMPLICATIONS: None immediate.   INDICATIONS FOR PROCEDURE:  The patient, Susan Wilson, is a 40 y.o. female born on [birth year], is here for repeat left breast reduction. She underwent lumpectomy on right with oncoplastic bilateral reduction followed by adjuvant radiation. Post radiation the right breast is significantly smaller compared with right and additional reduction will be performed on left.    FINDINGS: left breast reduction 632 g  DESCRIPTION OF PROCEDURE:  The patient was marked standing in the preoperative area to mark sternal notch, chest midline, anterior axillary lines, inframammary folds. The location of new nipple areolar complex was marked symmetric with right breast.  The patient was taken to the operating room. SCDs were placed and IV antibiotics were given. The patient's operative site was prepped and draped in a sterile fashion. A time out was performed and all information was confirmed to be correct.   Incision made over right chest port scar and carried through superficial fascia and capsule over port incised. Sutures removed and port removed intact. Closure completed with 4-0 vicryl in capsule and superficial fascia, dermis. Skin closure completed with 4-0 monocryl subcuticular.  Over left breast,  Patient had prior superomedial pedicle and this was preserved. Vertical limbs marked symmetric with right. Incision made along prior NAC circumareolar scar. De epithelization of tissue over  superomedial pedicle completed. Inferior pole breast tissue resected. Vertical pillars developed. Breast tailor tacked closed.Patient brought to upright sitting position and assessed for symmetry. Patent returned to supine position and additional tissue resected, including resection of prior IMF scars and associated soft tissue in these areas. She was assessed a second time in sitting position. She was returned to supine and breast cavities irrigated and hemostasis obtained. 15 Fr JP placed in each breast and secured with 2-0 nylon. Closure completed with 3-0 vicryl to approximate dermis along inframammary fold and vertical limb. NAC inset with 4-0 vicryl in dermis. Skin closure completed with 4-0 monocryl subcuticular throughout.Tissue adhesive applied to all incisions. Dry dressing and breast binder applied  The patient was allowed to wake from anesthesia, extubated and taken to the recovery room in satisfactory condition.   SPECIMENS:left breast tissue  DRAINS: 70 Fr jp in left breast  Irene Limbo, MD [facility] Plastic & Reconstructive Surgery [phone], pin [phone]

## 2016-01-15 NOTE — Anesthesia Postprocedure Evaluation (Signed)
Anesthesia Post Note  Patient: Susan Wilson  Procedure(s) Performed: Procedure(s) (LRB): MAMMARY REDUCTION  (BREAST) LEFT (Left) POSSIBLE LEFT MASTOPEXY (Left) REMOVAL OF RIGHT PORT-A-CATH (Right)  Patient location during evaluation: PACU Anesthesia Type: General Level of consciousness: sedated Pain management: pain level controlled Vital Signs Assessment: post-procedure vital signs reviewed and stable Respiratory status: spontaneous breathing and respiratory function stable Cardiovascular status: stable Anesthetic complications: no    Last Vitals:  Vitals:   01/15/16 1130 01/15/16 1200  BP: (!) 140/91 (!) 158/99  Pulse: 90 91  Resp: 12 16  Temp:  36.7 C    Last Pain:  Vitals:   01/15/16 1200  PainSc: 5                  Myrene Bougher DANIEL

## 2016-01-15 NOTE — Discharge Instructions (Signed)
JP Drain [employer] this sheet to all of your post-operative appointments while you have your drains.  Please measure your drains by CC's or ML's.  Make sure you drain and measure your JP Drains 2 or 3 times per day.  At the end of each day, add up totals for the left side and add up totals for the right side.    ( 9 am )     ( 3 pm )        ( 9 pm )                Date L  R  L  R  L  R  Total L/R                                                                                                                                                                                       About my Jackson-Pratt Bulb Drain  What is a Jackson-Pratt bulb? A Jackson-Pratt is a soft, round device used to collect drainage. It is connected to a long, thin drainage catheter, which is held in place by one or two small stiches near your surgical incision site. When the bulb is squeezed, it forms a vacuum, forcing the drainage to empty into the bulb.  Emptying the Jackson-Pratt bulb- To empty the bulb: 1. Release the plug on the top of the bulb. 2. Pour the bulb's contents into a measuring container which your nurse will provide. 3. Record the time emptied and amount of drainage. Empty the drain(s) as often as your     doctor or nurse recommends.  Date                  Time                    Amount (Drain 1)                 Amount (Drain  2)  _____________________________________________________________________  _____________________________________________________________________  _____________________________________________________________________  _____________________________________________________________________  _____________________________________________________________________  _____________________________________________________________________  _____________________________________________________________________  _____________________________________________________________________  Squeezing the Jackson-Pratt Bulb- To squeeze the bulb: 1. Make sure the plug at the top of the bulb is open. 2. Squeeze the bulb tightly in your fist. You will hear air squeezing from the bulb. 3. Replace the plug while the bulb is squeezed. 4. Use a safety pin to attach the bulb to your clothing. This will keep the catheter from     pulling at the bulb insertion site.  When to call your doctor- Call your doctor if:  Drain site becomes red, swollen or hot.  You  have a fever greater than 101 degrees F.  There is oozing at the drain site.  Drain falls out (apply a guaze bandage over the drain hole and secure it with tape).  Drainage increases daily not related to activity patterns. (You will usually have more drainage when you are active than when you are resting.)  Drainage has a bad odor.   Post Anesthesia Home Care Instructions  Activity: Get plenty of rest for the remainder of the day. A responsible adult should stay with you for 24 hours following the procedure.  For the next 24 hours, DO NOT: -Drive a car -Paediatric nurse -Drink alcoholic beverages -Take any medication unless instructed by your physician -Make any legal decisions or sign important papers.  Meals: Start with liquid foods such as gelatin or soup. Progress to regular foods as tolerated. Avoid greasy, spicy, heavy foods. If nausea  and/or vomiting occur, drink only clear liquids until the nausea and/or vomiting subsides. Call your physician if vomiting continues.  Special Instructions/Symptoms: Your throat may feel dry or sore from the anesthesia or the breathing tube placed in your throat during surgery. If this causes discomfort, gargle with warm salt water. The discomfort should disappear within 24 hours.  If you had a scopolamine patch placed behind your ear for the management of post- operative nausea and/or vomiting:  1. The medication in the patch is effective for 72 hours, after which it should be removed.  Wrap patch in a tissue and discard in the trash. Wash hands thoroughly with soap and water. 2. You may remove the patch earlier than 72 hours if you experience unpleasant side effects which may include dry mouth, dizziness or visual disturbances. 3. Avoid touching the patch. Wash your hands with soap and water after contact with the patch.

## 2016-01-15 NOTE — Interval H&P Note (Signed)
History and Physical Interval Note:  01/15/2016 6:57 AM  Susan Wilson  has presented today for surgery, with the diagnosis of history of Right Breast Cancer and asymmetry reconstructive breast  The various methods of treatment have been discussed with the patient and family. After consideration of risks, benefits and other options for treatment, the patient has consented to  Procedure(s): MAMMARY REDUCTION  (BREAST) LEFT (Left) POSSIBLE LEFT MASTOPEXY (Left) REMOVAL OF RIGHT PORT-A-CATH (Right) as a surgical intervention .  The patient's history has been reviewed, patient examined, no change in status, stable for surgery.  I have reviewed the patient's chart and labs.  Questions were answered to the patient's satisfaction.     [name]

## 2016-01-15 NOTE — Anesthesia Procedure Notes (Signed)
Procedure Name: Intubation Performed by: Terrance Mass Pre-anesthesia Checklist: Patient identified, Emergency Drugs available, Suction available and Patient being monitored Patient Re-evaluated:Patient Re-evaluated prior to inductionOxygen Delivery Method: Circle system utilized Preoxygenation: Pre-oxygenation with 100% oxygen Intubation Type: IV induction Ventilation: Mask ventilation without difficulty Laryngoscope Size: Miller and 2 Grade View: Grade I Tube type: Oral Tube size: 7.0 mm Number of attempts: 1 Airway Equipment and Method: Stylet and Oral airway Placement Confirmation: ETT inserted through vocal cords under direct vision,  positive ETCO2 and breath sounds checked- equal and bilateral Secured at: 23 cm Tube secured with: Tape Dental Injury: Teeth and Oropharynx as per pre-operative assessment

## 2016-01-15 NOTE — Transfer of Care (Signed)
Immediate Anesthesia Transfer of Care Note  Patient: Susan Wilson  Procedure(s) Performed: Procedure(s): MAMMARY REDUCTION  (BREAST) LEFT (Left) POSSIBLE LEFT MASTOPEXY (Left) REMOVAL OF RIGHT PORT-A-CATH (Right)  Patient Location: PACU  Anesthesia Type:General  Level of Consciousness: awake and sedated  Airway & Oxygen Therapy: Patient Spontanous Breathing and Patient connected to face mask oxygen  Post-op Assessment: Report given to RN and Post -op Vital signs reviewed and stable  Post vital signs: Reviewed and stable  Last Vitals:  Vitals:   01/15/16 0645  BP: 129/80  Pulse: 98  Resp: 18  Temp: 37.1 C    Last Pain:  Vitals:   01/15/16 0647  PainSc: 1          Complications: No apparent anesthesia complications

## 2016-01-20 ENCOUNTER — Encounter (HOSPITAL_BASED_OUTPATIENT_CLINIC_OR_DEPARTMENT_OTHER): Payer: Self-pay | Admitting: Plastic Surgery

## 2016-01-23 IMAGING — MR MR BREAST BILAT WO/W CM
5 of 10 series · 23 of 48 positions shown · IV contrast (20cc multihance)
Comparison: MRI of the breast dated 12/05/2014, mammogram and
ultrasound dated 11/21/2014 and Previous mammograms.

CLINICAL DATA: Patient with recent diagnosis of right breast
cancer, undergoing neoadjuvant chemotherapy.

LABS:  None.
EXAM:
BILATERAL BREAST MRI WITH AND WITHOUT CONTRAST
TECHNIQUE: Multiplanar, multisequence MR images of both breasts were obtained
prior to and following the intravenous administration of 20 ml of
MultiHance.

[Series 3: T2 · axial · 3.0mm · 0.47mm/px · z∈[-112,+59]mm · 3 of 58 slices shown]
[im 1/58]
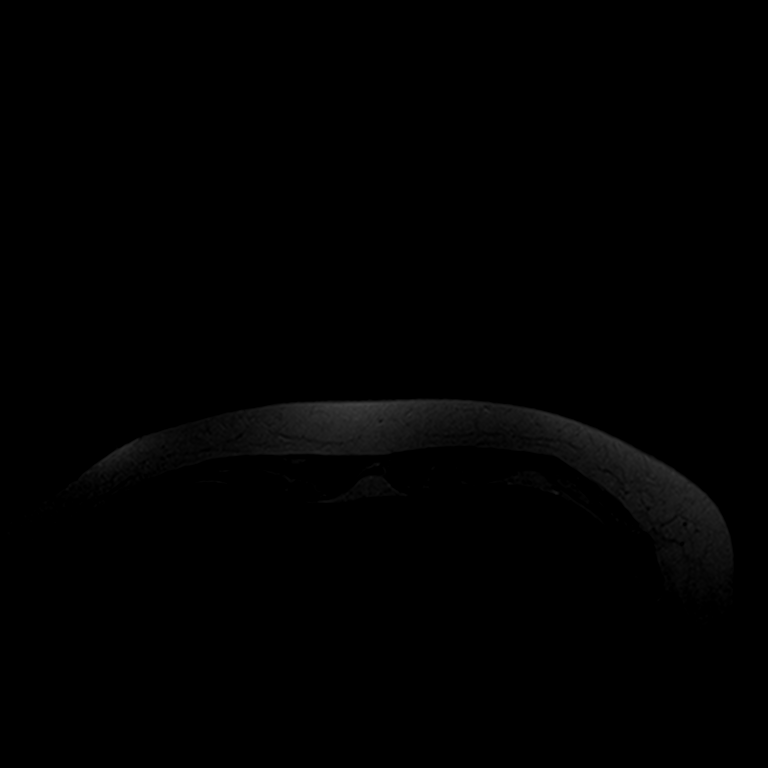
[im 29/58]
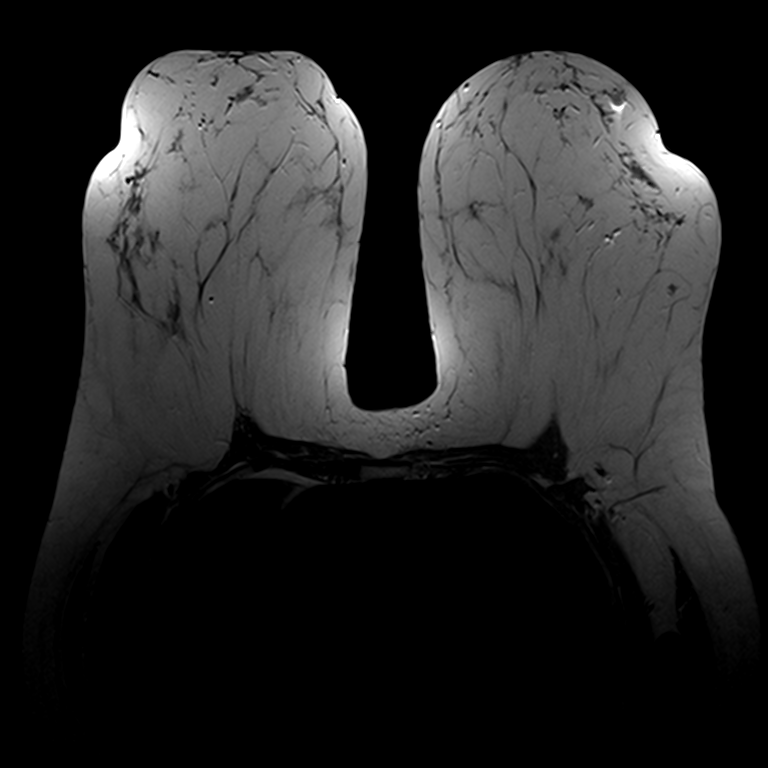
[im 58/58]
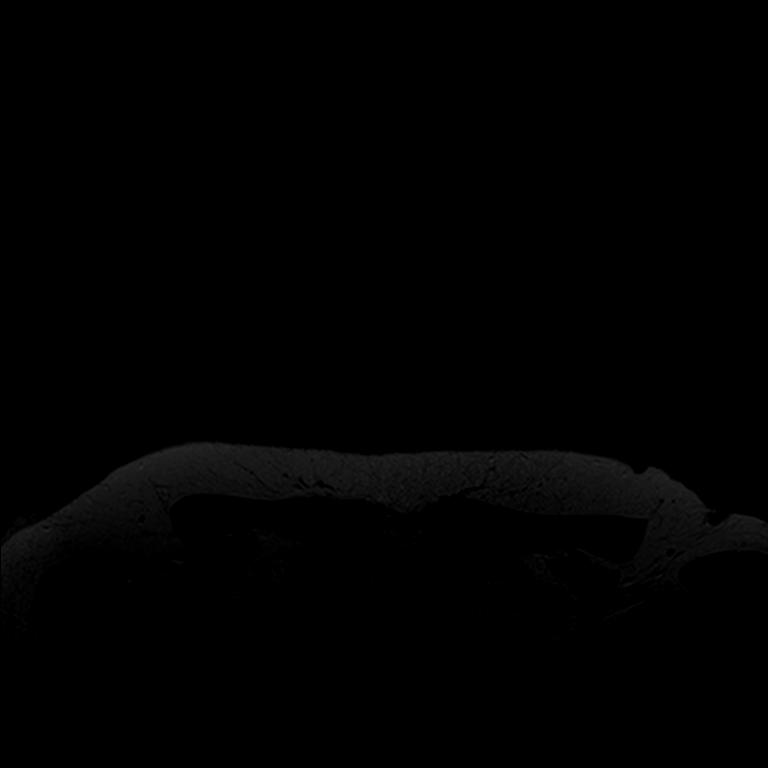

[Series 4: t2_tirm_tra ipat (a-p) · axial · 3.0mm · 0.70mm/px · z∈[-112,+59]mm · 3 of 57 slices shown]
[im 1/57]
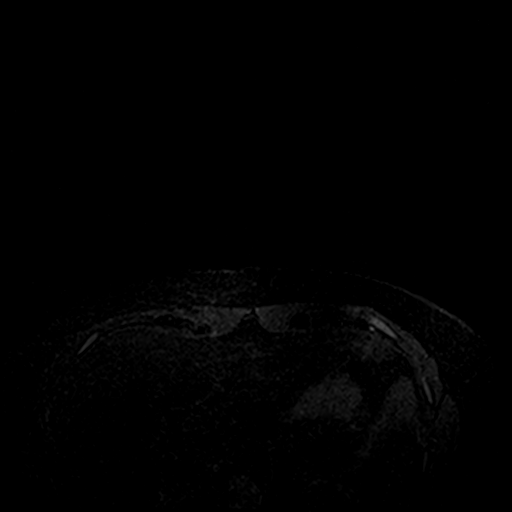
[im 29/57]
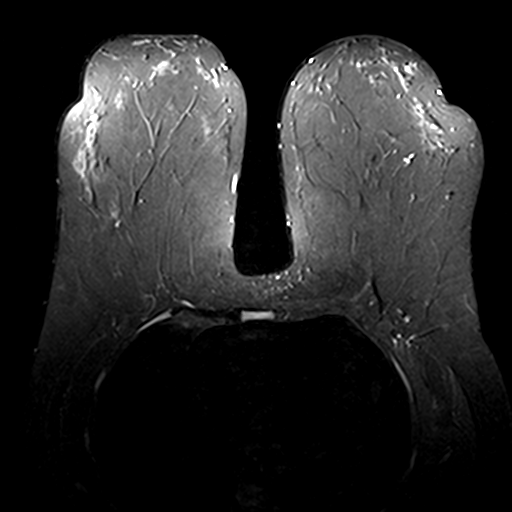
[im 57/57]
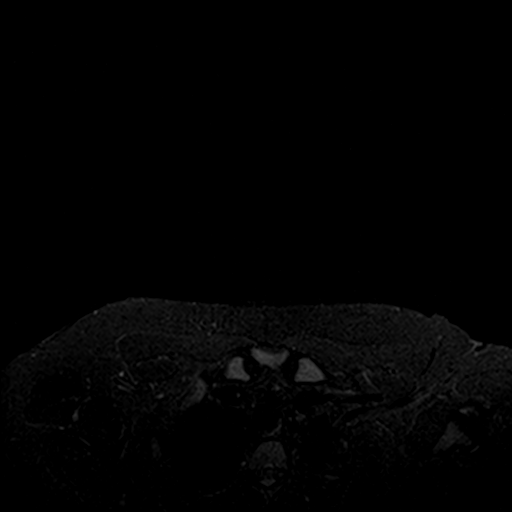

[Series 5: fl3d pre-cm no · axial · non-contrast · 1.2mm · 0.94mm/px · z∈[-112,+59]mm · 6 of 144 slices shown]
[im 1/144]
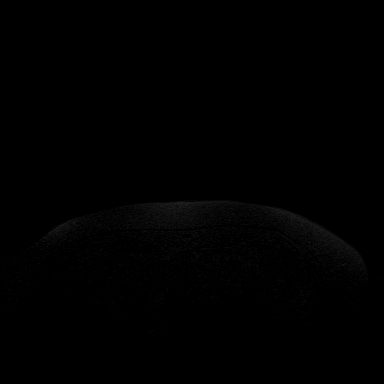
[im 29/144]
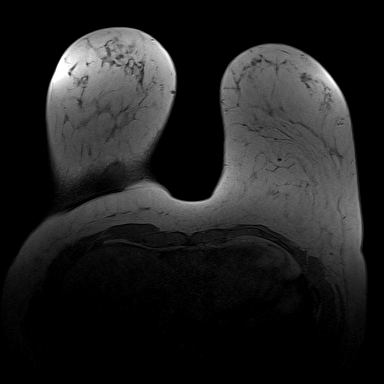
[im 58/144]
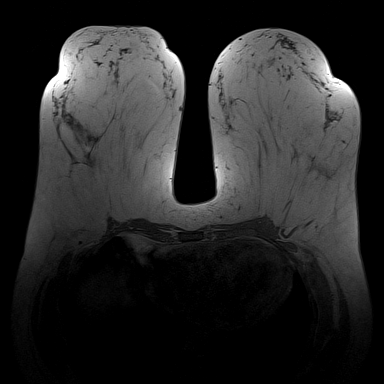
[im 86/144]
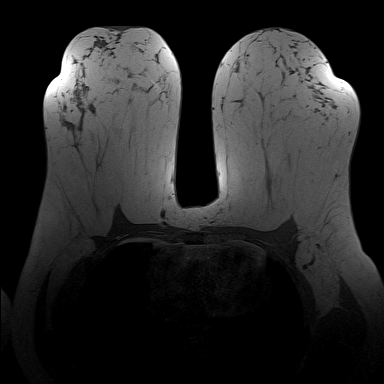
[im 115/144]
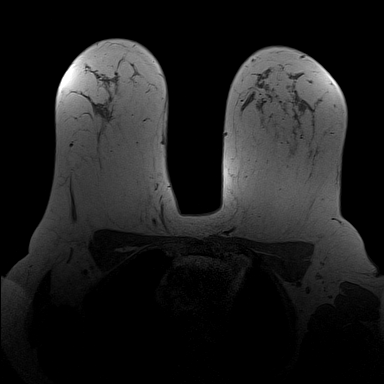
[im 144/144]
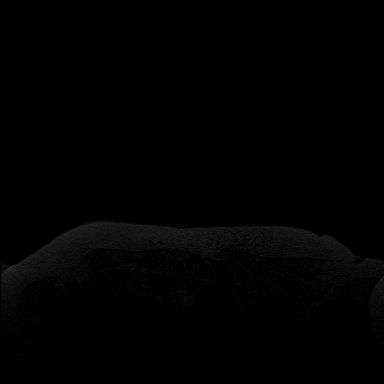

[Series 6: fl3d pre-cm · axial · non-contrast · 1.2mm · 0.94mm/px · z∈[-112,+59]mm · 6 of 144 slices shown]
[im 1/144]
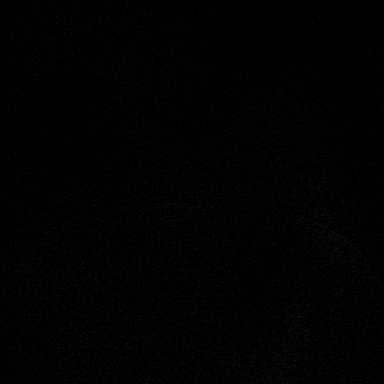
[im 29/144]
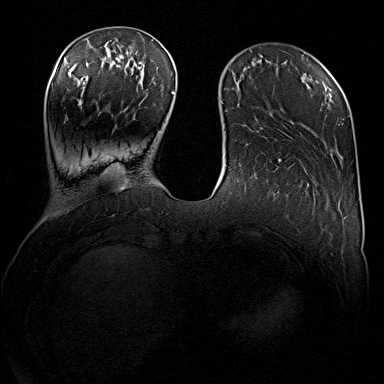
[im 58/144]
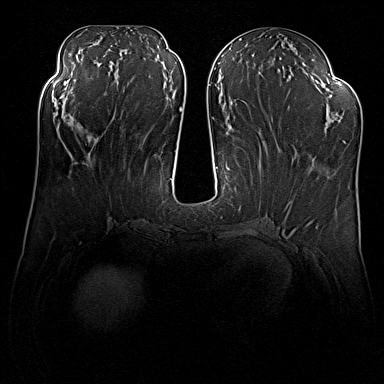
[im 86/144]
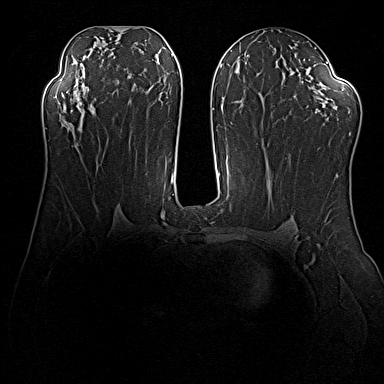
[im 115/144]
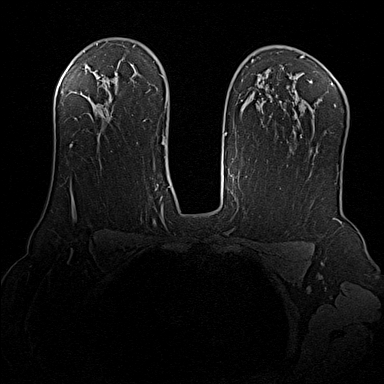
[im 144/144]
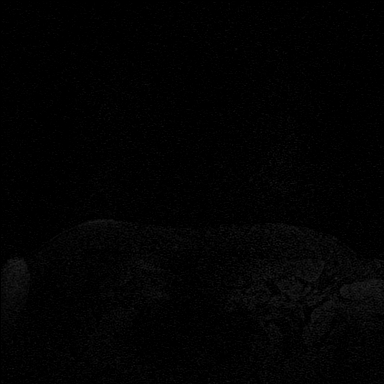

[Series 7: fl3d post immediate · axial · 1.2mm · 0.94mm/px · z∈[-112,+59]mm · 5 of 144 slices shown]
[im 1/144]
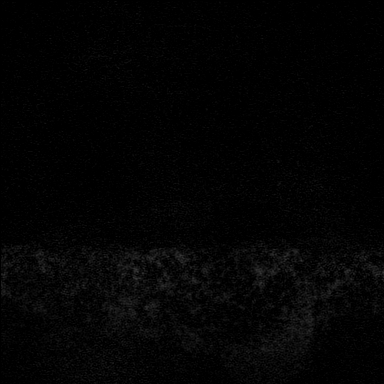
[im 36/144]
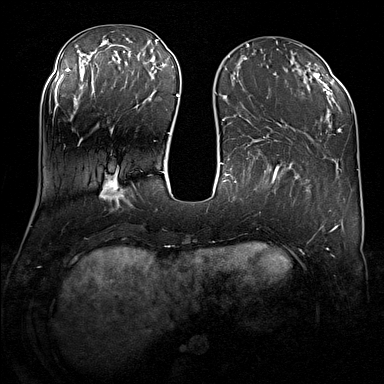
[im 72/144]
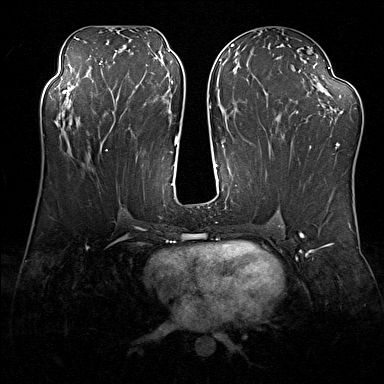
[im 108/144]
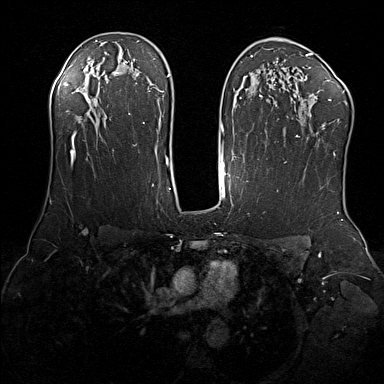
[im 144/144]
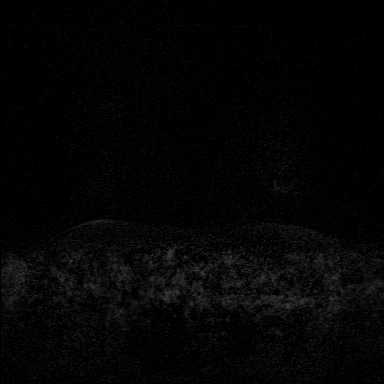

[23 of 48 positions shown; findings below may reference images not displayed]

THREE-DIMENSIONAL MR IMAGE RENDERING ON INDEPENDENT WORKSTATION:

Three-dimensional MR images were rendered by post-processing of the
original MR data on an independent workstation. The
three-dimensional MR images were interpreted, and findings are
reported in the following complete MRI report for this study. Three
dimensional images were evaluated at the independent [employer]
workstation
FINDINGS: Breast composition: b. Scattered fibroglandular tissue.

Background parenchymal enhancement: Moderate.

Right breast: The right breast 5 o'clock tumor has significantly
decreased in size, and is less avid demonstrating predominantly
progressive kinetics of enhancement. It now measures 3.9 by 2.9 by
1.6 cm. Continued extension to the overlying skin is noted. However
the pectoralis muscle appears involved.

Left breast: No mass or abnormal enhancement. Signal void artifact
is seen from tissue marker in the left breast lower outer quadrant,
anterior depth, from prior benign core needle biopsy.

Lymph nodes: No abnormal appearing lymph nodes.

Ancillary findings:  None.
IMPRESSION: Interval decrease in the size and intensity of enhancement of the
known right breast 5 o'clock malignancy, consistent with good
response to neoadjuvant chemotherapy.

RECOMMENDATION:
Continue with the plan of care.

BI-RADS CATEGORY  6: Known biopsy-proven malignancy.

## 2016-02-08 ENCOUNTER — Encounter: Payer: 59 | Admitting: Adult Health

## 2016-04-14 ENCOUNTER — Ambulatory Visit (HOSPITAL_BASED_OUTPATIENT_CLINIC_OR_DEPARTMENT_OTHER): Payer: Medicaid Other | Admitting: Adult Health

## 2016-04-14 VITALS — BP 128/79 | HR 80 | Temp 98.1°F | Resp 18 | Ht 65.0 in | Wt 234.2 lb

## 2016-04-14 DIAGNOSIS — Z171 Estrogen receptor negative status [ER-]: Secondary | ICD-10-CM | POA: Diagnosis not present

## 2016-04-14 DIAGNOSIS — M25611 Stiffness of right shoulder, not elsewhere classified: Secondary | ICD-10-CM

## 2016-04-14 DIAGNOSIS — C50311 Malignant neoplasm of lower-inner quadrant of right female breast: Secondary | ICD-10-CM | POA: Diagnosis present

## 2016-04-14 DIAGNOSIS — G620 Drug-induced polyneuropathy: Secondary | ICD-10-CM

## 2016-04-14 DIAGNOSIS — I890 Lymphedema, not elsewhere classified: Secondary | ICD-10-CM | POA: Diagnosis not present

## 2016-04-14 DIAGNOSIS — T451X5A Adverse effect of antineoplastic and immunosuppressive drugs, initial encounter: Secondary | ICD-10-CM

## 2016-04-14 MED ORDER — GABAPENTIN 300 MG PO CAPS: 300.0000 mg | ORAL_CAPSULE | Freq: Two times a day (BID) | ORAL | 3 refills | Status: DC

## 2016-04-14 NOTE — Progress Notes (Signed)
CLINIC:  Survivorship   REASON FOR VISIT:  Routine follow-up post-treatment for a recent history of breast cancer.  BRIEF ONCOLOGIC HISTORY:    Breast cancer of lower-inner quadrant of right female breast ([place])   11/20/2014 Breast US    5 mm mammographically seen nodule at 8:30 o'clock, posterior depth, without sonographic correlation.      11/20/2014 Breast US    Palpable highly suspicious mass in the right 5 o'clock breast, with apparent skin involvement, and associated right axillary lymphadenopathy.        11/21/2014 Initial Biopsy    (R) breast needle biopsy (5:00): IDC, grade 2-3; ER-, PR-, Ki 67 20%, HER2 equivocal.  LN biopsy: Negative.       11/25/2014 Initial Diagnosis    Breast cancer of lower-inner quadrant of right female breast      12/12/2014 Procedure    (R) axillary LN biopsy: Negative. (L) breast needle biopsy (4:00): Hyalinized fibroadenoma; no malignancy.      12/15/2014 Procedure    Genetic testing: Negative. Genes analyzed: ATM, BARD1, BRCA1, BRCA2, BRIP1, CDH1, CHEK2, EPCAM, FANCC, MLH1, MSH2, MSH6, NBN, PALB2, PMS2, PTEN, RAD51C, RAD51D, TP53, and XRCC2      01/01/2015 - 02/13/2015 Neo-Adjuvant Chemotherapy    Adriamycin/Cytoxan x 4 cycles       02/26/2015 - 05/14/2015 Neo-Adjuvant Chemotherapy    Carbo/Taxol x 12 (cycle #12 taxol dose-reduced due to low blood counts)       06/15/2015 Surgery    (R) lumpectomy with SLNB Donne Hazel): IDC, grade 1, 0.9 cm, invasive carcinoma focally < 0.1 cm to inferior margin.  0/4 SLN.   ypT1b,ypN0: Stage IA       06/23/2015 Surgery    Bilateral mammoplasty ([place]): No malignancy       09/01/2015 - 10/19/2015 Radiation Therapy    Adjuvant breast radiation Pablo Ledger).Right breast / 45 Gray @ 1.8 Pearline Cables per fraction x 25 fractions.  Right breast boost / 16 Gray at [employer] per fraction x 8 fractions       01/15/2016 Surgery    Left breast reduction ([place])       INTERVAL HISTORY:  Susan Wilson presents to  the [facility] today for our initial meeting to review her survivorship care plan detailing her treatment course for breast cancer, as well as monitoring long-term side effects of that treatment, education regarding health maintenance, screening, and overall wellness and health promotion.     Overall, Susan Wilson reports feeling quite well since completing her radiation therapy approximately 6 months ago.  She recently underwent left breast mammoplasty with Dr. Iran Planas in 01/2016.    She has not had her 2017 mammogram. She thinks she has healed enough since her recent surgery that she may be able to complete the mammogram without extreme discomfort.  She does continue to have intermittent sharp right breast pain; she takes oxycodone and Ibuprofen as needed which is effective.  She has decreased right shoulder range of motion since her surgery. She saw PT in the past, but wants to know if she can return for additional therapy.  She has received a prescription for a lymphedema sleeve and glove for her right arm, but is also concerned about lymphedema in her arm.    She has residual peripheral neuropathy since completing chemotherapy. She tells me that the numbness in her toes is a little better, but her hands give her the biggest trouble.  She is concerned that she will not be able to return to work with  her previous job duties (including typing) given the peripheral neuropathy.   She reports feeling fatigued. She doesn't sleep well. She thinks this is due in part to her anxiety. She does take Ativan, which is helpful, when needed.  She has hot flashes.     REVIEW OF SYSTEMS:  Review of Systems  Constitutional: Positive for fatigue.  HENT:  Negative.   Eyes: Negative.   Respiratory: Negative.   Cardiovascular: Negative.   Gastrointestinal: Negative.   Endocrine: Positive for hot flashes.  Genitourinary: Negative.  Negative for vaginal bleeding.   Skin: Negative.   Neurological:         Peripheral neuropathy  Psychiatric/Behavioral: Positive for sleep disturbance. The patient is nervous/anxious.      A 14-point review of systems was completed and was negative, except as noted above.   ONCOLOGY TREATMENT TEAM:  1. Surgeon:  Dr. Donne Hazel at [facility] 2. Medical Oncologist: Dr. Jana Hakim  3. Radiation Oncologist: Dr. Pablo Ledger    PAST MEDICAL/SURGICAL HISTORY:  Past Medical History:  Diagnosis Date  . Anemia   . Breast asymmetry 12/2015  . History of breast cancer 11/2014   right  . History of chemotherapy   . Migraines   . 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 BREAST RECONSTRUCTION Right 06/23/2015   Procedure: ONCOPLASTIC RIGHT BREAST REDUCTION;  Surgeon: Irene Limbo, MD;  Location: [place];  Service: Plastics;  Laterality: Right;  . BREAST REDUCTION SURGERY Left 06/23/2015   Procedure: MAMMARY REDUCTION  (BREAST) LEFT BREAST FOR SYMETRY (BILATERAL BREAST REDUCTION);  Surgeon: Irene Limbo, MD;  Location: [place];  Service: Plastics;  Laterality: Left;  . BREAST REDUCTION SURGERY Left 01/15/2016   Procedure: MAMMARY REDUCTION  (BREAST) LEFT;  Surgeon: Irene Limbo, MD;  Location: [place];  Service: Plastics;  Laterality: Left;  Marland Kitchen MASTOPEXY Left 01/15/2016   Procedure: POSSIBLE LEFT MASTOPEXY;  Surgeon: Irene Limbo, MD;  Location: [place];  Service: Plastics;  Laterality: Left;  . PORT-A-CATH REMOVAL Right 01/15/2016   Procedure: REMOVAL OF RIGHT PORT-A-CATH;  Surgeon: Irene Limbo, MD;  Location: [place];  Service: Plastics;  Laterality: Right;  . PORTACATH PLACEMENT Right 12/16/2014   Procedure: INSERTION PORT-A-CATH WITH ULTRASOUND;  Surgeon: Rolm Bookbinder, MD;  Location: [place];  Service: General;  Laterality: Right;  . RADIOACTIVE SEED GUIDED  MASTECTOMY WITH AXILLARY SENTINEL LYMPH NODE BIOPSY Right 06/15/2015   Procedure: RADIOACTIVE SEED GUIDED PARTIAL MASTECTOMY WITH AXILLARY SENTINEL LYMPH NODE BIOPSY;  Surgeon: Rolm Bookbinder, MD;  Location: [place];  Service: General;  Laterality: Right;     ALLERGIES:  Not on File   CURRENT MEDICATIONS:  Outpatient Encounter Prescriptions as of 04/14/2016  Medication Sig  . b complex vitamins tablet Take 1 tablet by mouth daily.  . ferrous sulfate 325 (65 FE) MG tablet Take 325 mg by mouth daily with breakfast.  . ibuprofen (ADVIL,MOTRIN) 200 MG tablet Take 400 mg by mouth every 6 (six) hours as needed (for pain). Reported on 05/14/2015  . LORazepam (ATIVAN) 0.5 MG tablet Take 1 tablet (0.5 mg total) by mouth at bedtime.  Marland Kitchen oxyCODONE-acetaminophen (ROXICET) 5-325 MG tablet Take 1-2 tablets by mouth every 4 (four) hours as needed for severe pain.  . SUMAtriptan (IMITREX) 50 MG tablet Take 1 tablet (50 mg total) by mouth every 2 (two) hours as needed for migraine.  May repeat in 2 hours if headache persists or recurs.  . ondansetron (ZOFRAN) 8 MG tablet Take by mouth every 8 (eight) hours as needed for nausea or vomiting.  . prochlorperazine (COMPAZINE) 10 MG tablet Take 1 tablet (10 mg total) by mouth every 6 (six) hours as needed for nausea or vomiting. (Patient not taking: Reported on 04/14/2016)   No facility-administered encounter medications on file as of 04/14/2016.      ONCOLOGIC FAMILY HISTORY:  Family History  Problem Relation Age of Onset  . Mesothelioma Maternal Grandfather     asbestos exposure, died in his 60s  . Heart defect Sister 0  . Breast cancer Paternal Grandmother   . Diabetes Paternal Grandmother   . Cancer Paternal Grandfather     NOS  . Colon cancer Other 11    MGMs brother with colon cancer  . Cancer Other     several of MGF's sisters with cancer NOS  . Liver cancer Other     MGF's brother  . Heart disease Paternal Aunt   . Heart  disease Paternal Uncle      GENETIC COUNSELING/TESTING: 12/15/14-Genetic testing: Negative. Genes analyzed: ATM, BARD1, BRCA1, BRCA2, BRIP1, CDH1, CHEK2, EPCAM, FANCC, MLH1, MSH2, MSH6, NBN, PALB2, PMS2, PTEN, RAD51C, RAD51D, TP53, and XRCC2   SOCIAL HISTORY:  Jillienne S Hallinan-Wilson is married and lives with her husband and children in [place], [place]. She has 2 children; a daughter who is 76 and a son who is 8.  She currently is not working, but hopes to return to work after she fully recovers from her treatments.  She denies any current tobacco or illicit drug use. She drinks alcohol occasionally.      PHYSICAL EXAMINATION:  Vital Signs:   Vitals:   04/14/16 1004  BP: 128/79  Pulse: 80  Resp: 18  Temp: 98.1 F (36.7 C)   Filed Weights   04/14/16 1004  Weight: 234 lb 3.2 oz (106.2 kg)   General: Well-nourished, well-appearing female in no acute distress.  She is unaccompanied today.  HEENT: Head is normocephalic.  Pupils equal and reactive to light. Conjunctivae clear without exudate.  Sclerae anicteric. Oral mucosa is pink, moist.  Oropharynx is pink without lesions or erythema.  Lymph: No cervical, supraclavicular, or infraclavicular lymphadenopathy noted on palpation.  Cardiovascular: Regular rate and rhythm.Marland Kitchen Respiratory: Clear to auscultation bilaterally. Chest expansion symmetric; breathing non-labored.  GI: Abdomen soft and round; non-tender, non-distended. Bowel sounds normoactive.  GU: Deferred.  Neuro/MSK: No focal deficits. Steady gait. Decreased right shoulder ROM. Mild right arm lymphedema present on exam.  Psych: Mood and affect normal and appropriate for situation.  Extremities: No edema. Skin: Warm and dry.  LABORATORY DATA:  None for this visit.  DIAGNOSTIC IMAGING:  None for this visit.      ASSESSMENT AND PLAN:  Ms.. Wilson is a pleasant 40 y.o. female with Stage IIA right breast invasive ductal carcinoma, ER-/PR-/HER2 equivocal, diagnosed in  11/2014; treated with neoadjuvant chemotherapy with Adriamycin/Cytoxan x 4, followed by Carbo/Taxol x 12 (cycle #12 dose-reduced d/t decreased blood counts).  She completed chemotherapy on 05/14/15.  She went on to have right lumpectomy and adjuvant radiation. She completed treatment on 10/19/15. She presents to the Survivorship Clinic for our initial meeting and routine follow-up post-completion of treatment for breast cancer.    1. Stage IIA right breast cancer:  Susan Wilson is continuing to recover from definitive treatment for breast cancer. She will follow-up with her medical oncologist, Dr. Jana Hakim, on  04/21/16 with history and physical exam per surveillance protocol.  She is due for mammogram and thinks she can tolerate having one done since her most recent surgeries are mostly healed.  Since she will see Dr. Jana Hakim next week, I will defer to his judgment on when he would like for her to get her mammogram completed.  Today, a comprehensive survivorship care plan and treatment summary was reviewed with the patient today detailing her breast cancer diagnosis, treatment course, potential late/long-term effects of treatment, appropriate follow-up care with recommendations for the future, and patient education resources.  A copy of this summary, along with a letter will be sent to the patient's primary care provider via mail/fax/In Basket message after today's visit.    2. Peripheral neuropathy secondary to chemotherapy: Given her residual peripheral neuropathy from chemotherapy, the sharp shooting pain in her breast, and her inability to sleep well at night, we discussed the option of starting Gabapentin 300 mg BID.  Taking the medication twice daily may be helpful to manage her neuropathic pain during the day, with an additional dose in the evening to help her sleep. She is willing to give this medication a try and we'll certainly let us know if the gabapentin does not improve her symptoms.  My hope is  the gabapentin will help her neuropathy so that she can return to work at some point with optimized management of her symptoms.   3. Decreased right shoulder ROM/Right arm lymphedema: Susan Wilson does have reduced right shoulder range of motion on exam today. This is likely secondary to her multiple breast surgeries. She has seen a physical therapist in the past, and it was very helpful for her. She is interested in returning back to physical therapy for additional support. Therefore, I have placed a referral to our outpatient cancer rehabilitation center. She also is concerned about right arm lymphedema given her right axillary lymph node dissection.  She already has a prescription for compression garments for the right arm and hand.  She would like to return to PT for additional lymphedema management as well.  She understands that she may have to wait, depending on her insurance company, until the new calendar year for additional PT visits.  I will certainly defer to outpatient rehab for this and am happy to help in any way needed.   4. Bone health:  Given Susan Wilson's history of breast cancer, she is at risk for bone demineralization.  We do not have any records of DEXA imaging.  Given that she will not require adjuvant anti-estrogen therapy, I will defer any future bone mineral density testing to her medical oncologist or PCP, as clinically indicated.  In the meantime, she was encouraged to increase her consumption of foods rich in calcium, as well as increase her weight-bearing activities.  She was given education on specific activities to promote bone health.  5. Cancer screening:  Due to Susan Wilson's history and her age, she should receive screening for skin cancers, colon cancer, and gynecologic cancers.  The information and recommendations are listed on the patient's comprehensive care plan/treatment summary and were reviewed in detail with the patient.    6. Health maintenance  and wellness promotion: Susan Wilson was encouraged to consume 5-7 servings of fruits and vegetables per day. We reviewed the "Nutrition Rainbow" handout, as well as the handout "Take Control of Your Health and Reduce Your Cancer Risk" from the [place].  She was also encouraged to engage in moderate to  vigorous exercise for 30 minutes per day most days of the week. We discussed the [employer] fitness program, which is designed for cancer survivors to help them become more physically fit after cancer treatments.  She was instructed to limit her alcohol consumption and continue to abstain from tobacco use.   7. Support services/counseling: It is not uncommon for this period of the patient's cancer care trajectory to be one of many emotions and stressors.  We discussed an opportunity for her to participate in the next session of [facility] ("Finding Your New Normal") support group series designed for patients after they have completed treatment.   Susan Wilson was encouraged to take advantage of our many other support services programs, support groups, and/or counseling in coping with her new life as a cancer survivor after completing anti-cancer treatment.  She was offered support today through active listening and expressive supportive counseling.  She was given information regarding our available services and encouraged to contact me with any questions or for help enrolling in any of our support group/programs.    Dispo:   -Return to cancer center to see Dr. Jana Hakim on 04/21/16 -She is welcome to return back to the Survivorship Clinic at any time; no additional follow-up needed at this time.  -Consider referral back to survivorship as a long-term survivor for continued surveillance   A total of 60 minutes of face-to-face time was spent with this patient with greater than 50% of that time in counseling and care-coordination.   Mike Craze, NP Survivorship Program [place] [phone]   Note: PRIMARY CARE PROVIDER Donnamae Jude, [place] [phone]

## 2016-04-15 ENCOUNTER — Other Ambulatory Visit: Payer: Self-pay | Admitting: *Deleted

## 2016-04-15 DIAGNOSIS — Z171 Estrogen receptor negative status [ER-]: Principal | ICD-10-CM

## 2016-04-15 DIAGNOSIS — C50311 Malignant neoplasm of lower-inner quadrant of right female breast: Secondary | ICD-10-CM

## 2016-04-18 ENCOUNTER — Other Ambulatory Visit (HOSPITAL_BASED_OUTPATIENT_CLINIC_OR_DEPARTMENT_OTHER): Payer: Medicaid Other

## 2016-04-18 ENCOUNTER — Encounter (HOSPITAL_COMMUNITY): Payer: Self-pay

## 2016-04-18 ENCOUNTER — Ambulatory Visit (HOSPITAL_COMMUNITY)
Admission: RE | Admit: 2016-04-18 | Discharge: 2016-04-18 | Disposition: A | Discharge status: Home / Self Care | Payer: Medicaid Other | Source: Ambulatory Visit | Attending: Oncology | Admitting: Oncology

## 2016-04-18 DIAGNOSIS — C50311 Malignant neoplasm of lower-inner quadrant of right female breast: Secondary | ICD-10-CM

## 2016-04-18 DIAGNOSIS — Z9889 Other specified postprocedural states: Secondary | ICD-10-CM | POA: Insufficient documentation

## 2016-04-18 DIAGNOSIS — Z171 Estrogen receptor negative status [ER-]: Secondary | ICD-10-CM

## 2016-04-18 LAB — COMPREHENSIVE METABOLIC PANEL
ALT: 20 U/L (ref 0–55)
ANION GAP: 8 meq/L (ref 3–11)
AST: 18 U/L (ref 5–34)
Albumin: 3.7 g/dL (ref 3.5–5.0)
Alkaline Phosphatase: 96 U/L (ref 40–150)
BILIRUBIN TOTAL: 0.61 mg/dL (ref 0.20–1.20)
BUN: 12.4 mg/dL (ref 7.0–26.0)
CALCIUM: 9.3 mg/dL (ref 8.4–10.4)
CHLORIDE: 103 meq/L (ref 98–109)
CO2: 26 mEq/L (ref 22–29)
CREATININE: 0.8 mg/dL (ref 0.6–1.1)
EGFR: >90 mL/min/{1.73_m2} (ref >90)
Glucose: 102 mg/dl (ref 70–140)
Potassium: 4.3 mEq/L (ref 3.5–5.1)
Sodium: 137 mEq/L (ref 136–145)
TOTAL PROTEIN: 7.6 g/dL (ref 6.4–8.3)

## 2016-04-18 LAB — CBC WITH DIFFERENTIAL/PLATELET
BASO%: 0.5 % (ref 0.0–2.0)
Basophils Absolute: 0 10*3/uL (ref 0.0–0.1)
EOS%: 1.9 % (ref 0.0–7.0)
Eosinophils Absolute: 0.1 10*3/uL (ref 0.0–0.5)
HEMATOCRIT: 38.3 % (ref 34.8–46.6)
HEMOGLOBIN: 12.2 g/dL (ref 11.6–15.9)
LYMPH#: 1.3 10*3/uL (ref 0.9–3.3)
LYMPH%: 34.3 % (ref 14.0–49.7)
MCH: 25.5 pg (ref 25.1–34.0)
MCHC: 31.9 g/dL (ref 31.5–36.0)
MCV: 80.1 fL (ref 79.5–101.0)
MONO#: 0.2 10*3/uL (ref 0.1–0.9)
MONO%: 6.3 % (ref 0.0–14.0)
NEUT%: 57 % (ref 38.4–76.8)
NEUTROS ABS: 2.1 10*3/uL (ref 1.5–6.5)
PLATELETS: 260 10*3/uL (ref 145–400)
RBC: 4.78 10*6/uL (ref 3.70–5.45)
RDW: 14.5 % (ref 11.2–14.5)
WBC: 3.6 10*3/uL — AB (ref 3.9–10.3)

## 2016-04-18 MED ORDER — IOPAMIDOL (ISOVUE-300) INJECTION 61%
75.0000 mL | Freq: Once | INTRAVENOUS | Status: AC | PRN
  Administered 2016-04-18: 75 mL via INTRAVENOUS

## 2016-04-19 ENCOUNTER — Other Ambulatory Visit: Payer: Self-pay | Admitting: *Deleted

## 2016-04-19 DIAGNOSIS — C50311 Malignant neoplasm of lower-inner quadrant of right female breast: Secondary | ICD-10-CM

## 2016-04-19 LAB — CANCER ANTIGEN 27.29: CAN 27.29: 12.4 U/mL (ref 0.0–38.6)

## 2016-04-19 MED ORDER — SUMATRIPTAN SUCCINATE 50 MG PO TABS: 50.0000 mg | ORAL_TABLET | ORAL | 0 refills | Status: DC | PRN

## 2016-04-20 ENCOUNTER — Telehealth: Payer: Self-pay | Admitting: *Deleted

## 2016-04-20 NOTE — Telephone Encounter (Signed)
Pt doesn't have insurance right now but has applied for Medicaid, has spoken to case worker and they are working on getting her reinstated. I told pt once she gets the insurance I will be able to get Rose to  schedule her some physical therapy appts. I will f/u again with this. Message to be fwd to [employer].

## 2016-04-21 ENCOUNTER — Ambulatory Visit (HOSPITAL_BASED_OUTPATIENT_CLINIC_OR_DEPARTMENT_OTHER): Payer: Medicaid Other | Admitting: Oncology

## 2016-04-21 ENCOUNTER — Encounter: Payer: Self-pay | Admitting: Adult Health

## 2016-04-21 VITALS — BP 129/80 | HR 72 | Temp 98.2°F | Resp 18 | Ht 65.0 in | Wt 234.8 lb

## 2016-04-21 DIAGNOSIS — C50311 Malignant neoplasm of lower-inner quadrant of right female breast: Secondary | ICD-10-CM | POA: Diagnosis not present

## 2016-04-21 DIAGNOSIS — R5383 Other fatigue: Secondary | ICD-10-CM

## 2016-04-21 DIAGNOSIS — M7989 Other specified soft tissue disorders: Secondary | ICD-10-CM

## 2016-04-21 DIAGNOSIS — Z171 Estrogen receptor negative status [ER-]: Secondary | ICD-10-CM

## 2016-04-21 DIAGNOSIS — G620 Drug-induced polyneuropathy: Secondary | ICD-10-CM

## 2016-04-21 DIAGNOSIS — M549 Dorsalgia, unspecified: Secondary | ICD-10-CM

## 2016-04-21 DIAGNOSIS — G4700 Insomnia, unspecified: Secondary | ICD-10-CM | POA: Diagnosis not present

## 2016-04-21 DIAGNOSIS — R0602 Shortness of breath: Secondary | ICD-10-CM | POA: Diagnosis not present

## 2016-04-21 DIAGNOSIS — M2550 Pain in unspecified joint: Secondary | ICD-10-CM

## 2016-04-21 NOTE — Progress Notes (Signed)
[facility]  Telephone:([phone]) [phone] Fax:(336) [phone]     ID: Susan Wilson DOB: 1975-05-26  MR#: [id number]  EXB#:[id number]  Patient Care Team: Donnamae Jude, MD as PCP - General (Obstetrics and Gynecology) Rolm Bookbinder, MD as Consulting Physician (General Surgery) Chauncey Cruel, MD as Consulting Physician (Oncology) Thea Silversmith, MD as Consulting Physician (Radiation Oncology) [name], RN as Registered Nurse Rockwell Germany, RN as Registered Nurse Holley Bouche, NP as Nurse Practitioner (Nurse Practitioner) PCP: Donnamae Jude, MD OTHER MD:  CHIEF COMPLAINT: Triple negative breast cancer  CURRENT TREATMENT: Observation  BREAST CANCER HISTORY: From the original intake note:  Susan Wilson herself noted a mass in her right breast sometime around April. Initially she thought it might be related to menstruation, but as it did not change and eventually became tender, she brought it to her physician's attention. On 11/20/2014 patient underwent bilateral diagnostic mammography with tomosynthesis and right breast ultrasonography at the [facility] Center. The breast density was category B. There was a hyperdense mass in the right lower inner quadrant associated with skin thickening. There was also a 5 mm nodule posteriorly at the 8:30 o'clock position in the right breast. There were several hyperdense nodules in the right axilla. On physical exam there was a firm fixed mass in the right breast at the 5:00 position. By ultrasound this was lobulated and appear to involve the skin. It measures up to 4.1 cm. There was no sonographic correlation to the 5 mm nodule seen in a different area of the right breast. The right axilla showed 3 hypervascular lymph nodes with prominent cortical thickening, measuring less than 1.5 cm.  On 11/21/2014 the patient underwent right breast biopsy (5:00 mass) and biopsy of one of the suspicious right axillary lymph nodes. The  pathology ([facility] [phone]) showed the breast biopsy to consist of invasive ductal carcinoma, grade 2, estrogen receptor and progesterone receptor negative, with an MIB-1 of 20%, and HER-2 equivocal, with the signals ratio of 1.41, but the average copy number per cell 4.35.  The patient's subsequent history is as detailed below  INTERVAL HISTORY:  Susan Wilson returns today for follow-up of her triple negative breast cancer. She is doing generally very well with no evidence of disease recurrence and we have just restage her with a CT scan of the chest which was entirely negative as well as lab work including a tumor marker which was all within normal limits.  REVIEW OF SYSTEMS: Susan Wilson is still having some sharp pains at the surgical breast. She had some physical therapy for this which helped. She complains of insomnia, moderate fatigue, sinus problems, ankle swelling, shortness of breath particularly when climbing stairs, easy bruising, back and joint pain which she treats with ibuprofen occasionally. Problems with numbness in her hands and feet at times are better, particularly the toes burn a little when she is more active, particularly at night, the fingers bother her with use. She does a lot of typing in her work. Aside from these issues a detailed review of systems today was noncontributory  PAST MEDICAL HISTORY: Past Medical History:  Diagnosis Date  . Anemia   . Breast asymmetry 12/2015  . History of breast cancer 11/2014   right  . History of chemotherapy   . Migraines   . 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 BREAST RECONSTRUCTION Right 06/23/2015  Procedure: ONCOPLASTIC RIGHT BREAST REDUCTION;  Surgeon: Irene Limbo, MD;  Location: [facility];  Service: Plastics;  Laterality: Right;  . BREAST REDUCTION SURGERY Left 06/23/2015   Procedure: MAMMARY REDUCTION   (BREAST) LEFT BREAST FOR SYMETRY (BILATERAL BREAST REDUCTION);  Surgeon: Irene Limbo, MD;  Location: [facility];  Service: Plastics;  Laterality: Left;  . BREAST REDUCTION SURGERY Left 01/15/2016   Procedure: MAMMARY REDUCTION  (BREAST) LEFT;  Surgeon: Irene Limbo, MD;  Location: [facility];  Service: Plastics;  Laterality: Left;  Marland Kitchen MASTOPEXY Left 01/15/2016   Procedure: POSSIBLE LEFT MASTOPEXY;  Surgeon: Irene Limbo, MD;  Location: [facility];  Service: Plastics;  Laterality: Left;  . PORT-A-CATH REMOVAL Right 01/15/2016   Procedure: REMOVAL OF RIGHT PORT-A-CATH;  Surgeon: Irene Limbo, MD;  Location: [facility];  Service: Plastics;  Laterality: Right;  . PORTACATH PLACEMENT Right 12/16/2014   Procedure: INSERTION PORT-A-CATH WITH ULTRASOUND;  Surgeon: Rolm Bookbinder, MD;  Location: [facility];  Service: General;  Laterality: Right;  . RADIOACTIVE SEED GUIDED MASTECTOMY WITH AXILLARY SENTINEL LYMPH NODE BIOPSY Right 06/15/2015   Procedure: RADIOACTIVE SEED GUIDED PARTIAL MASTECTOMY WITH AXILLARY SENTINEL LYMPH NODE BIOPSY;  Surgeon: Rolm Bookbinder, MD;  Location: [place];  Service: General;  Laterality: Right;    FAMILY HISTORY Family History  Problem Relation Age of Onset  . Mesothelioma Maternal Grandfather     asbestos exposure, died in his 88s  . Heart defect Sister 0  . Breast cancer Paternal Grandmother   . Diabetes Paternal Grandmother   . Cancer Paternal Grandfather     NOS  . Colon cancer Other 55    MGMs brother with colon cancer  . Cancer Other     several of MGF's sisters with cancer NOS  . Liver cancer Other     MGF's brother  . Heart disease Paternal Aunt   . Heart disease Paternal Uncle    the patient's parents are living, her father being 71 and her mother 41 as of July 2016. The patient had no brothers. One sister died at age 59 from cardiac problems. The other sister is in good  health. On the maternal side there is a history of colon cancer and an uncle age 71, liver cancer in a great uncle and mesothelioma in the maternal grandfather.  GYNECOLOGIC HISTORY:  Patient's last menstrual period was 04/14/2016. Menarche age 18. The patient is GX P2. 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 The daughters are [place] and Lovie Macadamia, age 32 and 3. The patient attends a local [place]: Not in place   HEALTH MAINTENANCE: Social History  Substance Use Topics  . Smoking status: Never Smoker  . Smokeless tobacco: Never Used  . Alcohol use Yes     Comment: rarely     Colonoscopy:  PAP: 2014  Bone density:  Lipid panel:  Not on File  Current Outpatient Prescriptions  Medication Sig Dispense Refill  . b complex vitamins tablet Take 1 tablet by mouth daily.    . ferrous sulfate 325 (65 FE) MG tablet Take 325 mg by mouth daily with breakfast.    . gabapentin (NEURONTIN) 300 MG capsule Take 1 capsule (300 mg total) by mouth 2 (two) times daily. 60 capsule 3  . ibuprofen (ADVIL,MOTRIN) 200 MG tablet Take 400 mg by mouth every 6 (  six) hours as needed (for pain). Reported on 05/14/2015    . LORazepam (ATIVAN) 0.5 MG tablet Take 1 tablet (0.5 mg total) by mouth at bedtime. 30 tablet 0  . SUMAtriptan (IMITREX) 50 MG tablet Take 1 tablet (50 mg total) by mouth every 2 (two) hours as needed for migraine. May repeat in 2 hours if headache persists or recurs. 10 tablet 0   No current facility-administered medications for this visit.     OBJECTIVE: Young African-American woman In no acute distress Vitals:   04/21/16 1609  BP: 129/80  Pulse: 72  Resp: 18  Temp: 98.2 F (36.8 C)     Body mass index is 39.07 kg/m.    ECOG FS:1 - Symptomatic but completely ambulatory  Sclerae unicteric, pupils round and equal Oropharynx  clear and moist-- no thrush or other lesions No cervical or supraclavicular adenopathy Lungs no rales or rhonchi Heart regular rate and rhythm Abd soft, nontender, positive bowel sounds MSK no focal spinal tenderness, no upper extremity lymphedema Neuro: nonfocal, well oriented, appropriate affect Breasts: The right breast is status post lumpectomy and radiation. It is also status post reduction mammoplasty. There is no evidence of local recurrence. The right axilla is benign. The left breast is status post reduction mammoplasty. It is otherwise unremarkable   LAB RESULTS:  CMP  CBC Latest Ref Rng & Units 04/18/2016 12/03/2015 09/16/2015  WBC 3.9 - 10.3 10e3/uL 3.6(L) 3.6(L) 3.3(L)  Hemoglobin 11.6 - 15.9 g/dL 12.2 11.9 11.6  Hematocrit 34.8 - 46.6 % 38.3 37.1 38.3  Platelets 145 - 400 10e3/uL 260 225 220   CMP Latest Ref Rng & Units 04/18/2016 12/03/2015 09/16/2015  Glucose 70 - 140 mg/dl 102 100 100  BUN 7.0 - 26.0 mg/dL 12.4 12.0 10.3  Creatinine 0.6 - 1.1 mg/dL 0.8 0.8 0.8  Sodium 136 - 145 mEq/L 137 138 138  Potassium 3.5 - 5.1 mEq/L 4.3 4.1 3.8  Chloride 101 - 111 mmol/L - - -  CO2 22 - 29 mEq/L 26 26 27   Calcium 8.4 - 10.4 mg/dL 9.3 9.4 9.1  Total Protein 6.4 - 8.3 g/dL 7.6 7.8 7.3  Total Bilirubin 0.20 - 1.20 mg/dL 0.61 0.81 0.60  Alkaline Phos 40 - 150 U/L 96 79 73  AST 5 - 34 U/L 18 21 16   ALT 0 - 55 U/L 20 26 23       Urinalysis    Component Value Date/Time   LABSPEC 1.020 05/06/2009 1439   PHURINE 7.0 05/06/2009 1439   GLUCOSEU NEGATIVE 05/06/2009 1439   HGBUR NEGATIVE 05/06/2009 1439   BILIRUBINUR NEGATIVE 05/06/2009 1439   KETONESUR NEGATIVE 05/06/2009 1439   PROTEINUR NEGATIVE 05/06/2009 1439   UROBILINOGEN 0.2 05/06/2009 1439   NITRITE NEGATIVE 05/06/2009 1439   LEUKOCYTESUR  05/06/2009 1439    NEGATIVE Biochemical Testing Only. Please order routine urinalysis from main lab if confirmatory testing is needed.    STUDIES: Ct Chest W Contrast  Result Date:  04/18/2016 CLINICAL DATA:  Right breast cancer diagnosed in 2016 with lumpectomy and lymph node dissection. Left breast reduction. Last chemotherapy in July. EXAM: CT CHEST WITH CONTRAST TECHNIQUE: Multidetector CT imaging of the chest was performed during intravenous contrast administration. CONTRAST:  8m ISOVUE-300 IOPAMIDOL (ISOVUE-300) INJECTION 61% COMPARISON:  Plain films 12/16/2014. No prior CT. Breast MR of 05/15/2015. FINDINGS: Cardiovascular: Heart size upper normal, without pericardial effusion. No central pulmonary embolism, on this non-dedicated study. Mediastinum/Nodes: No supraclavicular adenopathy. No mediastinal or hilar adenopathy. No internal mammary adenopathy. Lungs/Pleura:  No pleural fluid.  Clear lungs. Upper Abdomen: Normal imaged portions of the liver, spleen, stomach, pancreas, adrenal glands, kidneys. Musculoskeletal: Right axillary node dissection and medial right-sided lumpectomy. Right breast skin thickening which is likely radiation induced. Example image 95/series 2. No axillary or subpectoral adenopathy. No acute osseous abnormality. IMPRESSION: Right lumpectomy and axillary node dissection. No evidence of recurrent or metastatic disease. Electronically Signed   By: Abigail Miyamoto M.D.   On: 04/18/2016 08:56    ASSESSMENT: 40 y.o. BRCA negative Susan Wilson woman s/p Right breast biopsy lower inner quadrant 11/21/2014 for a clinical T2 NX, stage 2 invasive ductal carcinoma, grade 2 or 3, estrogen and progesterone receptor negative, HER-2 equivocal, with an Mib-1 of 20%  (a) biopsy of a suspicious axillary lymph node same day was negative, but discordant  (b) review of 80 additional tumor cells by FISH still showed HER-2 not amplified; tumor should be treated as a triple negative  (1) neoadjuvant chemotherapy started 01/01/2015 consisting of cyclophosphamide and doxorubicin in dose dense fashion 4, completed 02/13/2015, followed by paclitaxel/ carboplatin weekly 12 started  02/26/2015, completed 05/14/2015.   (2) genetics testing 12/15/2014 through the Breast/Ovarian gene panel offered by [facility] found no deleterious mutations in ATM, BARD1, BRCA1, BRCA2, BRIP1, CDH1, CHEK2, EPCAM, FANCC, MLH1, MSH2, MSH6, NBN, PALB2, PMS2, PTEN, RAD51C, RAD51D, TP53, and XRCC2  (3) right lumpectomy and sentinel lymph node sampling 06/15/2015 showed a residual pT1b pN0 invasive ductal carcinoma, grade 1, with negative margins. (There were actually a few microscopic nests of residual tumor and 0.9 is the longest distance between 2 of the microscopic nests--there was not enough tissue for repeat prognostic panel.)  (a) bilateral reduction mammoplasty 06/23/2015 showed no malignancy in either breast  (4) adjuvant radiation 09/01/2015-10/19/2015:  Right breast / 45 Gray @ 1.8 Pearline Cables per fraction x 25 fractions Right breast boost / 16 Gray at [employer] per fraction x 8 fractions   (5) the patient's menses stopped during chemotherapy and have not resumed   PLAN: Susan Wilson is just about a year out from definitive surgery for her breast cancer with no evidence of disease recurrence. This is favorable.  We reviewed her CT scan and lab work, which will serve as a baseline. The CT and particularly his very reassuring.  She still having some neuropathy problems. The gabapentin is helping and we are continuing that. He doesn't seem to make her sleepy.  At work she has an enormous amount of typing to do. I wonder if some of this could be done through a dragon system and I went ahead and showed her how we do it with our dragon in case her company is able to provide her with that.  Otherwise she is pretty much done with the reconstruction. She will return to see me in August 2018, and we will start seeing her on a yearly basis.  She knows to call for any problems that may develop before then.  Chauncey Cruel, MD   04/23/2016 8:24 AM

## 2016-05-13 ENCOUNTER — Other Ambulatory Visit: Payer: Self-pay | Admitting: Nurse Practitioner

## 2016-05-25 ENCOUNTER — Other Ambulatory Visit: Payer: Self-pay | Admitting: Adult Health

## 2016-05-25 ENCOUNTER — Telehealth: Payer: Self-pay | Admitting: *Deleted

## 2016-05-25 DIAGNOSIS — I890 Lymphedema, not elsewhere classified: Secondary | ICD-10-CM

## 2016-05-25 DIAGNOSIS — C50311 Malignant neoplasm of lower-inner quadrant of right female breast: Secondary | ICD-10-CM

## 2016-05-25 DIAGNOSIS — M25611 Stiffness of right shoulder, not elsewhere classified: Secondary | ICD-10-CM

## 2016-05-25 DIAGNOSIS — Z171 Estrogen receptor negative status [ER-]: Principal | ICD-10-CM

## 2016-05-25 NOTE — Telephone Encounter (Signed)
Telephone call to patient to advise new referral for 2018 PT has been completed. Advised in message to return call with any further questions.

## 2016-06-01 ENCOUNTER — Ambulatory Visit: Payer: Medicaid Other | Admitting: Physical Therapy

## 2016-06-06 ENCOUNTER — Ambulatory Visit: Payer: Medicaid Other | Attending: Adult Health | Admitting: Physical Therapy

## 2016-06-06 DIAGNOSIS — M25611 Stiffness of right shoulder, not elsewhere classified: Secondary | ICD-10-CM | POA: Insufficient documentation

## 2016-06-06 DIAGNOSIS — M25511 Pain in right shoulder: Secondary | ICD-10-CM | POA: Insufficient documentation

## 2016-06-06 DIAGNOSIS — I890 Lymphedema, not elsewhere classified: Secondary | ICD-10-CM | POA: Diagnosis not present

## 2016-06-06 NOTE — Therapy (Signed)
[place], [place], [address] Phone: [phone]   Fax:  [phone]  Physical Therapy Evaluation  Patient Details  Name: Susan Wilson MRN: [id number] Date of Birth: 01/21/1976 Referring Provider: Mike Craze, NP  Encounter Date: 06/06/2016      PT End of Session - 06/06/16 1325    Visit Number 1   Number of Visits 4   Date for PT Re-Evaluation 07/13/16   PT Start Time 0938   PT Stop Time 1019   PT Time Calculation (min) 41 min   Activity Tolerance Patient tolerated treatment well   Behavior During Therapy [facility] for tasks assessed/performed      Past Medical History:  Diagnosis Date  . Anemia   . Breast asymmetry 12/2015  . History of breast cancer 11/2014   right  . History of chemotherapy   . Migraines   . 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 BREAST RECONSTRUCTION Right 06/23/2015   Procedure: ONCOPLASTIC RIGHT BREAST REDUCTION;  Surgeon: Irene Limbo, MD;  Location: [place];  Service: Plastics;  Laterality: Right;  . BREAST REDUCTION SURGERY Left 06/23/2015   Procedure: MAMMARY REDUCTION  (BREAST) LEFT BREAST FOR SYMETRY (BILATERAL BREAST REDUCTION);  Surgeon: Irene Limbo, MD;  Location: [place];  Service: Plastics;  Laterality: Left;  . BREAST REDUCTION SURGERY Left 01/15/2016   Procedure: MAMMARY REDUCTION  (BREAST) LEFT;  Surgeon: Irene Limbo, MD;  Location: [place];  Service: Plastics;  Laterality: Left;  Marland Kitchen MASTOPEXY Left 01/15/2016   Procedure: POSSIBLE LEFT MASTOPEXY;  Surgeon: Irene Limbo, MD;  Location: [place];  Service: Plastics;  Laterality: Left;  . PORT-A-CATH REMOVAL Right 01/15/2016   Procedure: REMOVAL OF RIGHT PORT-A-CATH;  Surgeon: Irene Limbo, MD;  Location: [place];   Service: Plastics;  Laterality: Right;  . PORTACATH PLACEMENT Right 12/16/2014   Procedure: INSERTION PORT-A-CATH WITH ULTRASOUND;  Surgeon: Rolm Bookbinder, MD;  Location: [place];  Service: General;  Laterality: Right;  . RADIOACTIVE SEED GUIDED MASTECTOMY WITH AXILLARY SENTINEL LYMPH NODE BIOPSY Right 06/15/2015   Procedure: RADIOACTIVE SEED GUIDED PARTIAL MASTECTOMY WITH AXILLARY SENTINEL LYMPH NODE BIOPSY;  Surgeon: Rolm Bookbinder, MD;  Location: [place];  Service: General;  Laterality: Right;    There were no vitals filed for this visit.       Subjective Assessment - 06/06/16 0941    Subjective Still having right shoulder pain and arm pain, which limited movement; says she was told she might have some fluid in that area.   Pertinent History Right lumpectomy with 6 nodes removed end of January 2017, and had breast reduction on both sides, she had another on the left side in September.  Had chemotherapy before surgery and completed radiation October 19, 2015.  Has been seen here before for lymphedema and shoulder ROM.  Doesn't have her compression sleeve yet, but has a prescription for one.  She does some taping at home, wears TG soft sleeve and wraps with an ACE or short stretch bandage.  Still has a pulley at home that she uses, and Theraband (yellow).  Portacath was removed and it's still tender there.  Now she just does follow-up visits, but no further treatment. No other health issues.  Has neuropathy in both hands and feet, with reported numbness and tingling.   Patient Stated Goals  Try to get less pain in my arm and more ROM, like reaching back or washing my back.   Currently in Pain? Yes   Pain Score 4    Pain Location Shoulder   Pain Orientation Right;Upper   Pain Descriptors / Indicators Aching   Aggravating Factors  cool weather, a lot of stretching, lifting laundry, too much reaching   Pain Relieving Factors ibuprofen, Percocet (1/2)   Effect of Pain on Daily  Activities difficult to get dressed            [facility] PT Assessment - 06/06/16 0001      Assessment   Medical Diagnosis right breast cancer   Referring Provider Mike Craze, NP   Onset Date/Surgical Date 06/15/15   Hand Dominance Right   Prior Therapy here, last year, quite a few visits     Precautions   Precautions Other (comment)   Precaution Comments cancer, lymphedema precautions     Restrictions   Weight Bearing Restrictions No     Balance Screen   Has the patient fallen in the past 6 months No   Has the patient had a decrease in activity level because of a fear of falling?  No   Is the patient reluctant to leave their home because of a fear of falling?  No     Home Environment   Living Environment Private residence   Living Arrangements Spouse/significant other;Children  ages 31 and 8   Type of [place] One level     Prior Function   Level of Independence Independent   Vocation Other (comment)  on leave   Vocation Requirements on leave from [employer], and not sure about the job she will go back to   Leisure shoulder stretches, walking 3x/week for at least 30 minutes     Cognition   Overall Cognitive Status Within Functional Limits for tasks assessed     Observation/Other Assessments   Observations possible swelling at anterior right upper chest and at right upper back; difficult to measure in these areas   Skin Integrity not observed today, but patient denies any open wounds or scabs   Quick DASH  54.55     ROM / Strength   AROM / PROM / Strength AROM     AROM   AROM Assessment Site Shoulder   Right/Left Shoulder Right;Left   Right Shoulder Extension 32 Degrees   Right Shoulder Flexion 131 Degrees   Right Shoulder ABduction 106 Degrees   Right Shoulder Internal Rotation 65 Degrees  in supine; reports pulling at shoulder   Right Shoulder External Rotation 44 Degrees  in supine; reports discomfort   Left Shoulder Extension 46 Degrees    Left Shoulder Flexion 158 Degrees   Left Shoulder ABduction 175 Degrees   Left Shoulder Internal Rotation 71 Degrees  in supine   Left Shoulder External Rotation 90 Degrees  in supine     PROM   PROM Assessment Site Shoulder   Right/Left Shoulder Right   Right Shoulder Flexion 143 Degrees   Right Shoulder ABduction 100 Degrees           LYMPHEDEMA/ONCOLOGY QUESTIONNAIRE - 06/06/16 0955      Type   Cancer Type right breast     Surgeries   Lumpectomy Date 06/15/15   Other Surgery Date 01/15/16  left breast 2nd reduction   Number Lymph Nodes Removed 7     Treatment   Past Chemotherapy Treatment Yes   Date 05/14/15  Past Radiation Treatment Yes   Date 10/21/15     Lymphedema Assessments   Lymphedema Assessments Upper extremities     Right Upper Extremity Lymphedema   15 cm Proximal to Olecranon Process 40.5 cm   10 cm Proximal to Olecranon Process 38.3 cm   Olecranon Process 29.7 cm   15 cm Proximal to Ulnar Styloid Process 31 cm   10 cm Proximal to Ulnar Styloid Process 27.2 cm   Just Proximal to Ulnar Styloid Process 19.7 cm   Across Hand at [employer] 20.7 cm   At [place] of 2nd Digit 6.7 cm     Left Upper Extremity Lymphedema   15 cm Proximal to Olecranon Process 39.8 cm   10 cm Proximal to Olecranon Process 38.6 cm   Olecranon Process 29.4 cm   15 cm Proximal to Ulnar Styloid Process 29.9 cm   10 cm Proximal to Ulnar Styloid Process 26.9 cm   Just Proximal to Ulnar Styloid Process 19.9 cm   Across Hand at [employer] 19.8 cm   At [place] of 2nd Digit 6.3 cm           Quick Dash - 06/06/16 0001    Open a tight or new jar Moderate difficulty   Do heavy household chores (wash walls, wash floors) Moderate difficulty   Carry a shopping bag or briefcase Moderate difficulty   Wash your back Severe difficulty   Use a knife to cut food Moderate difficulty   Recreational activities in which you take some force or impact through your arm, shoulder, or  hand (golf, hammering, tennis) Severe difficulty   During the past week, to what extent has your arm, shoulder or hand problem interfered with your normal social activities with family, friends, neighbors, or groups? Modererately   During the past week, to what extent has your arm, shoulder or hand problem limited your work or other regular daily activities Modererately   Arm, shoulder, or hand pain. Moderate   Tingling (pins and needles) in your arm, shoulder, or hand Moderate   Difficulty Sleeping Moderate difficulty   DASH Score 54.55 %                             Long Term Clinic Goals - 06/06/16 1335      CC Long Term Goal  #1   Title Right shoulder active flexion to at least 145 degrees for improved overhead reach.   Time 4   Period Weeks   Status New     CC Long Term Goal  #2   Title Right shoulder external rotation to 70 degrees. to allow for better shoulder abduction    Time 4   Period Weeks   Status New     CC Long Term Goal  #3   Title Right shoulder abduction to at least 150 degrees for improved ADLs.   Time 4   Period Weeks   Status New     CC Long Term Goal  #4   Title Patient will know how and where to obtain compression garments for right UE.     CC Long Term Goal  #5   Title Independent in HEP for right shoulder ROM and strengthening.   Time 4   Period Weeks   Status New            Plan - 06/06/16 1326    Clinical Impression Statement Patient who has been  seen in this clinic before, s/p right lumpectomy and left breast reduction (the latter x 2 surgeries), 6 lymph nodes removed on right and with h/o lymphedema and treatment for that here.  She now has possible mild swelling in right UE ( right 0.7 cm. larger than left at 15 cm. proximal to olecranon, 1.1 cm. larger at 15 cm. proximal to ulnar styloid, and 0.9 cm. larger at hand) and possibly at right chest and upper back.  She does have pain in the right shoulder also, with limited  ROM and strength there.  This impairs her function.  She alread does some lymphedema self-care at home.  Eval is low complexity due to lack of comorbidities and evolving.   Rehab Potential Good   PT Frequency --  3 visits total   PT Duration 3 weeks   PT Treatment/Interventions ADLs/Self Care Home Management;Therapeutic activities;Therapeutic exercise;Patient/family education;Manual techniques;Manual lymph drainage;Scar mobilization;Passive range of motion;Taping   PT Next Visit Plan Work with patient to obtain compression sleeve for right UE.  Instruct in HEP for right shoulder AA/A/ROM; later, instruct in strengthening--possibly strength ABC program.  Lymphedema risk reduction education.   Consulted and Agree with Plan of Care Patient      Patient will benefit from skilled therapeutic intervention in order to improve the following deficits and impairments:  Decreased range of motion, Decreased strength, Increased edema, Impaired UE functional use, Pain  Visit Diagnosis: Lymphedema, not elsewhere classified - Plan: PT PLAN OF CARE CERT/RE-CERT  Stiffness of right shoulder, not elsewhere classified - Plan: PT PLAN OF CARE CERT/RE-CERT  Right shoulder pain, unspecified chronicity - Plan: PT PLAN OF CARE CERT/RE-CERT     Problem List Patient Active Problem List   Diagnosis Date Noted  . Port catheter in place 12/28/2015  . Chemotherapy induced neutropenia ([place]) 05/14/2015  . 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 Tooth ache 01/09/2015  . Genetic testing 12/26/2014  . Family history of breast cancer   . Breast cancer of lower-inner quadrant of right female breast ([place]) 11/25/2014  . Migraine 11/19/2014    Germaine Shenker 06/06/2016, 1:41 PM  [place], [place], [address] Phone: [phone]   Fax:  [phone]  Name: KRISTELLE CAVALLARO MRN: [id number] Date of Birth: 05/14/1976  Serafina Royals, PT 06/06/16 1:42 PM

## 2016-06-09 ENCOUNTER — Telehealth: Payer: Self-pay | Admitting: Adult Health

## 2016-06-09 NOTE — Telephone Encounter (Signed)
Received call from patient who is about 1 year out from chemotherapy c/o continued neuropathy.  She is taking vitamins and gabapentin for this.  She has not noted an improvement.  She and I discussed increasing her PM Gabapentin dose to 654m (not taking any percocet near bed time), and seeing how she does.  I offered her a referral to neurology as well.  Patient will call me back in a couple of days to let me know how she is doing.    LCharlestine Massed NP

## 2016-06-14 ENCOUNTER — Ambulatory Visit: Payer: Medicaid Other

## 2016-06-14 DIAGNOSIS — I890 Lymphedema, not elsewhere classified: Secondary | ICD-10-CM

## 2016-06-14 DIAGNOSIS — M25511 Pain in right shoulder: Secondary | ICD-10-CM

## 2016-06-14 DIAGNOSIS — M25611 Stiffness of right shoulder, not elsewhere classified: Secondary | ICD-10-CM

## 2016-06-14 NOTE — Therapy (Signed)
[place], [place], [address] Phone: [phone]   Fax:  [phone]  Physical Therapy Treatment  Patient Details  Name: Susan Wilson MRN: [id number] Date of Birth: [birth year] Referring Provider: Mike Craze, NP  Encounter Date: 06/14/2016      PT End of Session - 06/14/16 1108    Visit Number 2   Number of Visits 4   Date for PT Re-Evaluation 07/13/16   PT Start Time 1024   PT Stop Time 1107   PT Time Calculation (min) 43 min   Activity Tolerance Patient tolerated treatment well   Behavior During Therapy [facility] for tasks assessed/performed      Past Medical History:  Diagnosis Date  . Anemia   . Breast asymmetry 12/2015  . History of breast cancer 11/2014   right  . History of chemotherapy   . Migraines   . 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 BREAST RECONSTRUCTION Right 06/23/2015   Procedure: ONCOPLASTIC RIGHT BREAST REDUCTION;  Surgeon: Irene Limbo, MD;  Location: [place];  Service: Plastics;  Laterality: Right;  . BREAST REDUCTION SURGERY Left 06/23/2015   Procedure: MAMMARY REDUCTION  (BREAST) LEFT BREAST FOR SYMETRY (BILATERAL BREAST REDUCTION);  Surgeon: Irene Limbo, MD;  Location: [place];  Service: Plastics;  Laterality: Left;  . BREAST REDUCTION SURGERY Left 01/15/2016   Procedure: MAMMARY REDUCTION  (BREAST) LEFT;  Surgeon: Irene Limbo, MD;  Location: [place];  Service: Plastics;  Laterality: Left;  Marland Kitchen MASTOPEXY Left 01/15/2016   Procedure: POSSIBLE LEFT MASTOPEXY;  Surgeon: Irene Limbo, MD;  Location: [place];  Service: Plastics;  Laterality: Left;  . PORT-A-CATH REMOVAL Right 01/15/2016   Procedure: REMOVAL OF RIGHT PORT-A-CATH;  Surgeon: Irene Limbo, MD;  Location: [place];   Service: Plastics;  Laterality: Right;  . PORTACATH PLACEMENT Right 12/16/2014   Procedure: INSERTION PORT-A-CATH WITH ULTRASOUND;  Surgeon: Rolm Bookbinder, MD;  Location: [place];  Service: General;  Laterality: Right;  . RADIOACTIVE SEED GUIDED MASTECTOMY WITH AXILLARY SENTINEL LYMPH NODE BIOPSY Right 06/15/2015   Procedure: RADIOACTIVE SEED GUIDED PARTIAL MASTECTOMY WITH AXILLARY SENTINEL LYMPH NODE BIOPSY;  Surgeon: Rolm Bookbinder, MD;  Location: [place];  Service: General;  Laterality: Right;    There were no vitals filed for this visit.      Subjective Assessment - 06/14/16 1028    Subjective I have an appt with [facility] to get measured for a compression sleeve and glove next week. My Rt shoulder still hurts when I reach back and it doesn't feel like it rotates correctly.    Pertinent History Right lumpectomy with 6 nodes removed end of January 2017, and had breast reduction on both sides, she had another on the left side in September.  Had chemotherapy before surgery and completed radiation October 19, 2015.  Has been seen here before for lymphedema and shoulder ROM.  Doesn't have her compression sleeve yet, but has a prescription for one.  She does some taping at home, wears TG soft sleeve and wraps with an ACE or short stretch bandage.  Still has a pulley at home that she uses, and Theraband (yellow).  Portacath was removed and it's still tender there.  Now she just does follow-up visits, but no further treatment. No other health issues.  Has neuropathy in both  hands and feet, with reported numbness and tingling.   Patient Stated Goals Try to get less pain in my arm and more ROM, like reaching back or washing my back.   Currently in Pain? Yes   Pain Score 3    Pain Location Shoulder   Pain Orientation Right   Pain Descriptors / Indicators Aching   Aggravating Factors  cold weather   Pain Relieving Factors ibuprofen, Percocet and using a warm towel on it with  Arnican gel                         [facility] Adult PT Treatment/Exercise - 06/14/16 0001      Shoulder Exercises: Pulleys   Flexion 2 minutes   ABduction 2 minutes     Shoulder Exercises: Therapy Ball   Flexion 10 reps  With forward lean into end of stretch     Shoulder Exercises: Stretch   Corner Stretch 3 reps;10 seconds     Manual Therapy   Passive ROM In Supine to Rt shoulder into flexion, abduction, and D2 all to pts tolerance                PT Education - 06/14/16 1122    Education provided Yes   Education Details Pt had appt with [facility] but informed her that she would not pay out of pocket with [facility] so she opted to use them for compression garments instead.    Person(s) Educated Patient   Methods Explanation   Comprehension Verbalized understanding                [facility] [facility] Goals - 06/06/16 1335      CC Long Term Goal  #1   Title Right shoulder active flexion to at least 145 degrees for improved overhead reach.   Time 4   Period Weeks   Status New     CC Long Term Goal  #2   Title Right shoulder external rotation to 70 degrees. to allow for better shoulder abduction    Time 4   Period Weeks   Status New     CC Long Term Goal  #3   Title Right shoulder abduction to at least 150 degrees for improved ADLs.   Time 4   Period Weeks   Status New     CC Long Term Goal  #4   Title Patient will know how and where to obtain compression garments for right UE.     CC Long Term Goal  #5   Title Independent in HEP for right shoulder ROM and strengthening.   Time 4   Period Weeks   Status New            Plan - 06/14/16 1119    Clinical Impression Statement Pt had some guarding with P/ROM but with VC was able to relax though did have c/o pain at end ROM. She did very well with AA/ROM attaining full ROM stretching with no c/o pain with these. She is to resume pulleys on a more consistent basis at home.     Rehab Potential Good   PT Frequency --  3 visits total   PT Duration 3 weeks   PT Treatment/Interventions ADLs/Self Care Home Management;Therapeutic activities;Therapeutic exercise;Patient/family education;Manual techniques;Manual lymph drainage;Scar mobilization;Passive range of motion;Taping   PT Next Visit Plan  Instruct in HEP for right shoulder AA/A/ROM; later, instruct in strengthening--possibly strength ABC program.  Lymphedema risk reduction education.  Recommended Other Services Sending pts demographics to [facility] today   Consulted and Agree with Plan of Care Patient      Patient will benefit from skilled therapeutic intervention in order to improve the following deficits and impairments:  Decreased range of motion, Decreased strength, Increased edema, Impaired UE functional use, Pain  Visit Diagnosis: Lymphedema, not elsewhere classified  Stiffness of right shoulder, not elsewhere classified  Right shoulder pain, unspecified chronicity     Problem List Patient Active Problem List   Diagnosis Date Noted  . Port catheter in place 12/28/2015  . Chemotherapy induced neutropenia ([place]) 05/14/2015  . 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 Tooth ache 01/09/2015  . Genetic testing 12/26/2014  . Family history of breast cancer   . Breast cancer of lower-inner quadrant of right female breast ([place]) 11/25/2014  . Migraine 11/19/2014    Otelia Limes, PTA 06/14/2016, 12:19 PM  [place], [place], [address] Phone: [phone]   Fax:  [phone]  Name: Susan Wilson MRN: [id number] Date of Birth: March 17, 1976

## 2016-06-21 ENCOUNTER — Ambulatory Visit: Payer: Medicaid Other | Attending: Adult Health

## 2016-06-21 DIAGNOSIS — M25611 Stiffness of right shoulder, not elsewhere classified: Secondary | ICD-10-CM | POA: Diagnosis present

## 2016-06-21 DIAGNOSIS — M25511 Pain in right shoulder: Secondary | ICD-10-CM | POA: Diagnosis present

## 2016-06-21 DIAGNOSIS — I890 Lymphedema, not elsewhere classified: Secondary | ICD-10-CM | POA: Diagnosis present

## 2016-06-21 NOTE — Therapy (Signed)
[place], [place], [address] Phone: [phone]   Fax:  [phone]  Physical Therapy Treatment  Patient Details  Name: Susan Wilson MRN: [id number] Date of Birth: 1976-04-30 Referring Provider: Mike Craze, NP  Encounter Date: 06/21/2016      PT End of Session - 06/21/16 1114    Visit Number 3   Number of Visits 4   Date for PT Re-Evaluation 07/13/16   PT Start Time 1025   PT Stop Time 1113   PT Time Calculation (min) 48 min   Behavior During Therapy [facility] for tasks assessed/performed      Past Medical History:  Diagnosis Date  . Anemia   . Breast asymmetry 12/2015  . History of breast cancer 11/2014   right  . History of chemotherapy   . Migraines   . 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 BREAST RECONSTRUCTION Right 06/23/2015   Procedure: ONCOPLASTIC RIGHT BREAST REDUCTION;  Surgeon: Irene Limbo, MD;  Location: [place];  Service: Plastics;  Laterality: Right;  . BREAST REDUCTION SURGERY Left 06/23/2015   Procedure: MAMMARY REDUCTION  (BREAST) LEFT BREAST FOR SYMETRY (BILATERAL BREAST REDUCTION);  Surgeon: Irene Limbo, MD;  Location: [place];  Service: Plastics;  Laterality: Left;  . BREAST REDUCTION SURGERY Left 01/15/2016   Procedure: MAMMARY REDUCTION  (BREAST) LEFT;  Surgeon: Irene Limbo, MD;  Location: [place];  Service: Plastics;  Laterality: Left;  Marland Kitchen MASTOPEXY Left 01/15/2016   Procedure: POSSIBLE LEFT MASTOPEXY;  Surgeon: Irene Limbo, MD;  Location: [place];  Service: Plastics;  Laterality: Left;  . PORT-A-CATH REMOVAL Right 01/15/2016   Procedure: REMOVAL OF RIGHT PORT-A-CATH;  Surgeon: Irene Limbo, MD;  Location: [place];  Service: Plastics;  Laterality: Right;  . PORTACATH  PLACEMENT Right 12/16/2014   Procedure: INSERTION PORT-A-CATH WITH ULTRASOUND;  Surgeon: Rolm Bookbinder, MD;  Location: [place];  Service: General;  Laterality: Right;  . RADIOACTIVE SEED GUIDED MASTECTOMY WITH AXILLARY SENTINEL LYMPH NODE BIOPSY Right 06/15/2015   Procedure: RADIOACTIVE SEED GUIDED PARTIAL MASTECTOMY WITH AXILLARY SENTINEL LYMPH NODE BIOPSY;  Surgeon: Rolm Bookbinder, MD;  Location: [place];  Service: General;  Laterality: Right;    There were no vitals filed for this visit.      Subjective Assessment - 06/21/16 1027    Subjective [facility] called me and they are coming to my house Monday to measure me for my compression sleeve and glove, so I'm excited about that. I was a little sore at my upper arm after last visit, but not unreasonably so.    Pertinent History Right lumpectomy with 6 nodes removed end of January 2017, and had breast reduction on both sides, she had another on the left side in September.  Had chemotherapy before surgery and completed radiation October 19, 2015.  Has been seen here before for lymphedema and shoulder ROM.  Doesn't have her compression sleeve yet, but has a prescription for one.  She does some taping at home, wears TG soft sleeve and wraps with an ACE or short stretch bandage.  Still has a pulley at home that she uses, and Theraband (yellow).  Portacath was removed and it's still tender there.  Now she just does follow-up visits, but no further treatment. No other health issues.  Has neuropathy in both hands and feet,  with reported numbness and tingling.   Patient Stated Goals Try to get less pain in my arm and more ROM, like reaching back or washing my back.   Currently in Pain? Yes   Pain Score 2    Pain Location Shoulder   Pain Orientation Right   Pain Descriptors / Indicators Aching   Aggravating Factors  cold   Pain Relieving Factors ibuprofen, percocet and using warm towel with Arnican gel on shoulder            [facility]  PT Assessment - 06/21/16 0001      AROM   Right Shoulder Extension 38 Degrees   Right Shoulder Flexion 145 Degrees   Right Shoulder ABduction 151 Degrees   Right Shoulder External Rotation 50 Degrees  In supine during P/ROM           LYMPHEDEMA/ONCOLOGY QUESTIONNAIRE - 06/21/16 1106      Right Upper Extremity Lymphedema   15 cm Proximal to Olecranon Process 38.5 cm   10 cm Proximal to Olecranon Process 36.7 cm   Olecranon Process 28.3 cm   15 cm Proximal to Ulnar Styloid Process 29.2 cm   10 cm Proximal to Ulnar Styloid Process 25.9 cm   Just Proximal to Ulnar Styloid Process 17.7 cm   Across Hand at [employer] 18.4 cm   At [place] of 2nd Digit 6.1 cm                  [facility] Adult PT Treatment/Exercise - 06/21/16 0001      Shoulder Exercises: Pulleys   Flexion 2 minutes   ABduction 2 minutes     Shoulder Exercises: Therapy Ball   Flexion 10 reps  With forward lean into top of stretch   ABduction 10 reps  Rt UE; with side lean top of stretch     Shoulder Exercises: Stretch   Corner Stretch 5 reps;10 seconds   Corner Stretch Limitations Pt required tactile cuing for correct technique   Other Shoulder Stretches Back, head, and buttocks against wall with bil UE abduction 5 times to pts tolerance     Manual Therapy   Passive ROM In Supine to Rt shoulder into flexion, abduction, and D2 all to pts tolerance                        Long Term Clinic Goals - 06/21/16 1120      CC Long Term Goal  #1   Title Right shoulder active flexion to at least 145 degrees for improved overhead reach.   Baseline 145 degrees-06/21/16   Status Achieved     CC Long Term Goal  #2   Title Right shoulder external rotation to 70 degrees. to allow for better shoulder abduction    Baseline 50 degrees-06/21/16   Status On-going     CC Long Term Goal  #3   Title Right shoulder abduction to at least 150 degrees for improved ADLs.   Baseline 151 degrees-06/21/16    Status Achieved     CC Long Term Goal  #4   Title Patient will know how and where to obtain compression garments for right UE.   Baseline Pt will be measured by [facility] at her home on 06/27/16   Status Achieved     CC Long Term Goal  #5   Title Independent in HEP for right shoulder ROM and strengthening.   Baseline Pt is now independent with HEP for ROM-06/21/16  Status Partially Met            Plan - 06/21/16 1114    Clinical Impression Statement Pt tolerated P/ROM much better today with very minimal to no guarding. She also did well with AA/ROM attaining near full ROM with no c/o pain at end ranges. Pt will be getting measured for compression garments by [facility] on Monday at her home. Her A/ROM has improved significantly in flexion and abduction and her circumference measurements have all reduced (most by at least 2 cm) since evaluation. Pt is progressing very well towards goals at this time.    Rehab Potential Good   PT Frequency --  3 visits total   PT Duration 3 weeks   PT Treatment/Interventions ADLs/Self Care Home Management;Therapeutic activities;Therapeutic exercise;Patient/family education;Manual techniques;Manual lymph drainage;Scar mobilization;Passive range of motion;Taping   PT Next Visit Plan Instruct in strength ABC program and lymphedema risk reduction education. D/C next visit per Medicaid.   Consulted and Agree with Plan of Care Patient      Patient will benefit from skilled therapeutic intervention in order to improve the following deficits and impairments:  Decreased range of motion, Decreased strength, Increased edema, Impaired UE functional use, Pain  Visit Diagnosis: Lymphedema, not elsewhere classified  Stiffness of right shoulder, not elsewhere classified  Right shoulder pain, unspecified chronicity     Problem List Patient Active Problem List   Diagnosis Date Noted  . Port catheter in place 12/28/2015  . Chemotherapy induced neutropenia  ([place]) 05/14/2015  . Chemotherapy-induced neuropathy ([place]) 04/08/2015  . 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 Tooth ache 01/09/2015  . Genetic testing 12/26/2014  . Family history of breast cancer   . Breast cancer of lower-inner quadrant of right female breast ([place]) 11/25/2014  . Migraine 11/19/2014    Otelia Limes, PTA 06/21/2016, 11:24 AM  [place], [place], [address] Phone: [phone]   Fax:  [phone]  Name: Susan Wilson MRN: [id number] Date of Birth: 11-Oct-1975

## 2016-06-28 ENCOUNTER — Encounter: Payer: Self-pay | Admitting: Physical Therapy

## 2016-06-28 ENCOUNTER — Ambulatory Visit: Payer: Medicaid Other | Admitting: Physical Therapy

## 2016-06-28 DIAGNOSIS — I890 Lymphedema, not elsewhere classified: Secondary | ICD-10-CM

## 2016-06-28 DIAGNOSIS — M25511 Pain in right shoulder: Secondary | ICD-10-CM

## 2016-06-28 DIAGNOSIS — M25611 Stiffness of right shoulder, not elsewhere classified: Secondary | ICD-10-CM

## 2016-06-28 NOTE — Therapy (Signed)
[place], [place], [address] Phone: [phone]   Fax:  [phone]  Physical Therapy Treatment  Patient Details  Name: Susan Wilson MRN: [id number] Date of Birth: 11-05-1975 Referring Provider: Mike Craze, NP  Encounter Date: 06/28/2016      PT End of Session - 06/28/16 1100    Visit Number 4   Number of Visits 4   Date for PT Re-Evaluation 07/13/16   PT Start Time 6945   PT Stop Time 1100   PT Time Calculation (min) 45 min   Activity Tolerance Patient tolerated treatment well   Behavior During Therapy [facility] for tasks assessed/performed      Past Medical History:  Diagnosis Date  . Anemia   . Breast asymmetry 12/2015  . History of breast cancer 11/2014   right  . History of chemotherapy   . Migraines   . 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 BREAST RECONSTRUCTION Right 06/23/2015   Procedure: ONCOPLASTIC RIGHT BREAST REDUCTION;  Surgeon: Irene Limbo, MD;  Location: [place];  Service: Plastics;  Laterality: Right;  . BREAST REDUCTION SURGERY Left 06/23/2015   Procedure: MAMMARY REDUCTION  (BREAST) LEFT BREAST FOR SYMETRY (BILATERAL BREAST REDUCTION);  Surgeon: Irene Limbo, MD;  Location: [place];  Service: Plastics;  Laterality: Left;  . BREAST REDUCTION SURGERY Left 01/15/2016   Procedure: MAMMARY REDUCTION  (BREAST) LEFT;  Surgeon: Irene Limbo, MD;  Location: [place];  Service: Plastics;  Laterality: Left;  Marland Kitchen MASTOPEXY Left 01/15/2016   Procedure: POSSIBLE LEFT MASTOPEXY;  Surgeon: Irene Limbo, MD;  Location: [place];  Service: Plastics;  Laterality: Left;  . PORT-A-CATH REMOVAL Right 01/15/2016   Procedure: REMOVAL OF RIGHT PORT-A-CATH;  Surgeon: Irene Limbo, MD;  Location: [place];   Service: Plastics;  Laterality: Right;  . PORTACATH PLACEMENT Right 12/16/2014   Procedure: INSERTION PORT-A-CATH WITH ULTRASOUND;  Surgeon: Rolm Bookbinder, MD;  Location: [place];  Service: General;  Laterality: Right;  . RADIOACTIVE SEED GUIDED MASTECTOMY WITH AXILLARY SENTINEL LYMPH NODE BIOPSY Right 06/15/2015   Procedure: RADIOACTIVE SEED GUIDED PARTIAL MASTECTOMY WITH AXILLARY SENTINEL LYMPH NODE BIOPSY;  Surgeon: Rolm Bookbinder, MD;  Location: [place];  Service: General;  Laterality: Right;    There were no vitals filed for this visit.      Subjective Assessment - 06/28/16 1017    Subjective It has been rainy outside so my shoulder has been aching more. They were going to measure me yesterday but she was stuck in the mud and she couldn't get out. They might measure me today.    Pertinent History Right lumpectomy with 6 nodes removed end of January 2017, and had breast reduction on both sides, she had another on the left side in September.  Had chemotherapy before surgery and completed radiation October 19, 2015.  Has been seen here before for lymphedema and shoulder ROM.  Doesn't have her compression sleeve yet, but has a prescription for one.  She does some taping at home, wears TG soft sleeve and wraps with an ACE or short stretch bandage.  Still has a pulley at home that she uses, and Theraband (yellow).  Portacath was removed and it's still tender there.  Now she just does follow-up visits, but no further treatment. No other health issues.  Has neuropathy in both  hands and feet, with reported numbness and tingling.   Patient Stated Goals Try to get less pain in my arm and more ROM, like reaching back or washing my back.   Currently in Pain? Yes   Pain Score 3    Pain Location Shoulder   Pain Orientation Right   Pain Descriptors / Indicators Aching                         [facility] Adult PT Treatment/Exercise - 06/28/16 0001      Shoulder Exercises:  Standing   Other Standing Exercises Strength after breast cancer program all stretches held 15 sec, exercises x 10 reps each     Shoulder Exercises: Pulleys   Flexion 2 minutes   ABduction 2 minutes     Shoulder Exercises: Therapy Ball   Flexion 10 reps  With forward lean into top of stretch   ABduction 10 reps  Rt UE; with side lean top of stretch                PT Education - 06/28/16 1054    Education provided Yes   Education Details lymphedema risk reduction practices, using a towel for internal and external rotation stretches   Person(s) Educated Patient   Methods Explanation;Handout   Comprehension Verbalized understanding                Long Term Clinic Goals - 06/28/16 1044      CC Long Term Goal  #1   Title Right shoulder active flexion to at least 145 degrees for improved overhead reach.   Baseline 145 degrees-06/21/16   Time 4   Period Weeks   Status Achieved     CC Long Term Goal  #2   Title Right shoulder external rotation to 70 degrees. to allow for better shoulder abduction    Baseline 50 degrees-06/21/16, 06/28/16- 70 degrees   Time 4   Period Weeks   Status Achieved     CC Long Term Goal  #3   Title Right shoulder abduction to at least 150 degrees for improved ADLs.   Baseline 151 degrees-06/21/16   Time 4   Period Weeks   Status Achieved     CC Long Term Goal  #4   Title Patient will know how and where to obtain compression garments for right UE.   Baseline Pt will be measured by [facility] at her home on 06/27/16   Time 6   Period Weeks   Status Achieved     CC Long Term Goal  #5   Title Independent in HEP for right shoulder ROM and strengthening.   Baseline Pt is now independent with HEP for ROM-06/21/16, 06/28/16- pt independent in strength after breast cancer program   Time 4   Period Weeks   Status Achieved            Plan - 06/28/16 1050    Clinical Impression Statement Patient was educated in strength after breast  cancer exercise program today. She was educated on how to progress exercises at home. Patient has now achieved all of her goals for therapy. Her right shoulder extrenal rotation ROM was drastically improved today. She will be getting measured for a compression garment for her RUE this week. Pt to be discharged from skilled PT services at this time.    Rehab Potential Good   PT Frequency --  3 visits total   PT Duration 3 weeks  PT Treatment/Interventions ADLs/Self Care Home Management;Therapeutic activities;Therapeutic exercise;Patient/family education;Manual techniques;Manual lymph drainage;Scar mobilization;Passive range of motion;Taping   PT Next Visit Plan d/c this visit   Consulted and Agree with Plan of Care Patient      Patient will benefit from skilled therapeutic intervention in order to improve the following deficits and impairments:  Decreased range of motion, Decreased strength, Increased edema, Impaired UE functional use, Pain  Visit Diagnosis: Lymphedema, not elsewhere classified  Stiffness of right shoulder, not elsewhere classified  Right shoulder pain, unspecified chronicity     Problem List Patient Active Problem List   Diagnosis Date Noted  . Port catheter in place 12/28/2015  . Chemotherapy induced neutropenia ([place]) 05/14/2015  . Chemotherapy-induced neuropathy ([place]) 04/08/2015  . 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 Tooth ache 01/09/2015  . Genetic testing 12/26/2014  . Family history of breast cancer   . Breast cancer of lower-inner quadrant of right female breast ([place]) 11/25/2014  . Migraine 11/19/2014    Allyson Sab[facility] 06/28/2016, 11:01 AM  [place], [place], [address] Phone: [phone]   Fax:  [phone]  Name: ALORIA LOOPER MRN: [id number] Date of Birth: 1976-02-18  PHYSICAL THERAPY DISCHARGE SUMMARY  Visits from Start of Care:  4  Current functional level related to goals / functional outcomes: Independent in a home exercise program, met all goals   Remaining deficits: none   Education / Equipment: HEP, lymphedema risk reduction, pt being measured for compression garment Plan: Patient agrees to discharge.  Patient goals were met. Patient is being discharged due to meeting the stated rehab goals.  ?????     [employer], [place] 06/28/16 11:02 AM

## 2016-07-01 ENCOUNTER — Telehealth: Payer: Self-pay | Admitting: *Deleted

## 2016-07-01 DIAGNOSIS — G609 Hereditary and idiopathic neuropathy, unspecified: Secondary | ICD-10-CM

## 2016-07-01 NOTE — Telephone Encounter (Signed)
Telephone call to patient- Gabapentin has been able to help with the pain and sleeping at night. Her nerve pain and shooting pain has not changed. She would like to have the referral to neurology at this point.   She has been taking 800-100 ibuprofen 2-3 times a day to help with the pain. She has used all of the percocet that she had left over and has only been using ibuprofen now.   She also states that the migraines have become more frequent. She states she has been told that they would become more intense as her nerves begin to "wake up" but she has been in pain for some time now.   Advised pt that I would forward this message to Mendel Ryder, NP for further advise and return her call.

## 2016-07-01 NOTE — Telephone Encounter (Signed)
Refer to neurology as ASAP referral for peripheral neuropathy.  Call in etodolac 534m po bid with food for the pain until she can get into see neurology.  She should not take any other nsaids while taking the etodolac.  No narcotics because they will make migraines worse.  She should drink plenty of water, take vitamin e supplement 400 units a day and await neurology appt.  If neurology appt is more than a month out, schedule her to come in and see me for face to face appt.

## 2016-07-04 MED ORDER — VITAMIN E 180 MG (400 UNIT) PO CAPS: 400.0000 [IU] | ORAL_CAPSULE | Freq: Every day | ORAL | 0 refills | Status: AC

## 2016-07-04 MED ORDER — ETODOLAC 500 MG PO TABS: 500.0000 mg | ORAL_TABLET | Freq: Two times a day (BID) | ORAL | 0 refills | Status: DC

## 2016-07-04 NOTE — Telephone Encounter (Signed)
Prescription sent to pharmacy as ordered, per patient Vitamin E sent to pharmacy as Rx also due to insurance purposes. Pt understands to discontinue all NSAIDS while taking this medication. Pt will call this office to advise neurology appt. She is aware Mendel Ryder will see her if neuro takes longer than 1 month to see her. Only #30 day sent to pharmacy for each medication.

## 2016-07-08 ENCOUNTER — Telehealth: Payer: Self-pay | Admitting: *Deleted

## 2016-07-08 MED ORDER — MELOXICAM 15 MG PO TABS: 7.5000 mg | ORAL_TABLET | Freq: Every day | ORAL | 0 refills | Status: DC

## 2016-07-08 NOTE — Telephone Encounter (Signed)
Pt advised medication change from Lodine to Meloxicam per insurance authorization complications. Pt verbalized an understanding. She will contact this office with any questions or concerns.

## 2016-07-15 ENCOUNTER — Telehealth: Payer: Self-pay

## 2016-07-15 NOTE — Telephone Encounter (Signed)
Called pt to see if she had neuro appt yet. She does not. S/w Mendel Ryder NP for preference and called [facility]. They do not see pt in que for referrals. Faxed referral to them.

## 2016-07-18 ENCOUNTER — Telehealth: Payer: Self-pay

## 2016-07-18 NOTE — Telephone Encounter (Signed)
-----   Message from Minette Headland, NP sent at 07/15/2016  9:06 AM EST ----- Will you check in on patient's referral to neurology?

## 2016-07-18 NOTE — Telephone Encounter (Signed)
Pt has neuro appt 09/19/16.

## 2016-07-19 ENCOUNTER — Telehealth: Payer: Self-pay | Admitting: Oncology

## 2016-07-19 NOTE — Telephone Encounter (Signed)
FAXED OFFICE NOTES TO [facility] [phone]

## 2016-08-11 ENCOUNTER — Other Ambulatory Visit: Payer: Self-pay | Admitting: Adult Health

## 2016-08-11 DIAGNOSIS — C50311 Malignant neoplasm of lower-inner quadrant of right female breast: Secondary | ICD-10-CM

## 2016-08-11 DIAGNOSIS — Z171 Estrogen receptor negative status [ER-]: Principal | ICD-10-CM

## 2016-08-11 MED ORDER — SUMATRIPTAN SUCCINATE 50 MG PO TABS: 50.0000 mg | ORAL_TABLET | ORAL | 5 refills | Status: DC | PRN

## 2016-08-11 MED ORDER — MELOXICAM 15 MG PO TABS
7.5000 mg | ORAL_TABLET | Freq: Every day | ORAL | 5 refills | Status: DC
Start: 2016-08-11 — End: 2016-08-15

## 2016-08-15 ENCOUNTER — Telehealth: Payer: Self-pay | Admitting: Adult Health

## 2016-08-15 ENCOUNTER — Other Ambulatory Visit: Payer: Self-pay | Admitting: Adult Health

## 2016-08-15 ENCOUNTER — Telehealth: Payer: Self-pay | Admitting: *Deleted

## 2016-08-15 DIAGNOSIS — C50311 Malignant neoplasm of lower-inner quadrant of right female breast: Secondary | ICD-10-CM

## 2016-08-15 MED ORDER — MELOXICAM 15 MG PO TABS: 7.5000 mg | ORAL_TABLET | Freq: Every day | ORAL | 5 refills | Status: DC

## 2016-08-15 NOTE — Telephone Encounter (Signed)
Per Mendel Ryder, NP [facility] [address] [facility] [address] Patient verbalized understanding.

## 2016-08-15 NOTE — Telephone Encounter (Signed)
Please call patient and let her know that I received her call about refilling the Meloxicam.  Please let her know that I sent this medication into her pharmacy--[facility] on [address].     Thanks so much,  Charlestine Massed, NP

## 2016-09-14 ENCOUNTER — Other Ambulatory Visit: Payer: Self-pay | Admitting: Adult Health

## 2016-09-14 DIAGNOSIS — C50311 Malignant neoplasm of lower-inner quadrant of right female breast: Secondary | ICD-10-CM

## 2016-09-14 MED ORDER — MELOXICAM 15 MG PO TABS: 15.0000 mg | ORAL_TABLET | Freq: Every day | ORAL | 5 refills | Status: DC

## 2016-09-19 ENCOUNTER — Ambulatory Visit: Payer: Medicaid Other | Admitting: Neurology

## 2016-09-21 ENCOUNTER — Ambulatory Visit (INDEPENDENT_AMBULATORY_CARE_PROVIDER_SITE_OTHER): Payer: PRIVATE HEALTH INSURANCE | Admitting: Neurology

## 2016-09-21 ENCOUNTER — Other Ambulatory Visit: Payer: PRIVATE HEALTH INSURANCE

## 2016-09-21 ENCOUNTER — Telehealth: Payer: Self-pay | Admitting: Neurology

## 2016-09-21 ENCOUNTER — Encounter: Payer: Self-pay | Admitting: Neurology

## 2016-09-21 VITALS — BP 112/56 | HR 78 | Ht 65.0 in | Wt 226.0 lb

## 2016-09-21 DIAGNOSIS — T451X5A Adverse effect of antineoplastic and immunosuppressive drugs, initial encounter: Secondary | ICD-10-CM | POA: Diagnosis not present

## 2016-09-21 DIAGNOSIS — G620 Drug-induced polyneuropathy: Secondary | ICD-10-CM

## 2016-09-21 LAB — TSH: TSH: 1.06 m[IU]/L

## 2016-09-21 MED ORDER — NORTRIPTYLINE HCL 10 MG PO CAPS
ORAL_CAPSULE | ORAL | 5 refills | Status: DC
Start: 2016-09-21 — End: 2020-02-25

## 2016-09-21 NOTE — Telephone Encounter (Signed)
Caller: PT  Urgent? No  Reason for the call: PT called because she is scheduled for an EMG of right arm and leg but because of the lymph node removal on right arm, she said no needles or blood pressure cuffs on that right arm

## 2016-09-21 NOTE — Progress Notes (Signed)
[facility] Clinic Note - Initial Visit   Date: 09/21/2016  Susan Wilson MRN: [id number] DOB: 1975/11/14   Dear Dr. Jana Hakim:  Thank you for your kind referral of Susan Wilson for consultation of neuropathy. Although his history is well known to you, please allow [place] to reiterate it for the purpose of our medical record. The patient was accompanied to the clinic by self.    History of Present Illness: Susan Wilson is a 41 y.o. right-handed [place] American female with invasive ductal carcinoma of the right breast (2016) s/p chemotherapy and radiation and mass resection (05/2015) presenting for evaluation of neuropathy.  She was diagnosed with right invasive ductal carcinoma of the right breast in July  2016.  She completed chemotherapy with cyclophosphamide and doxorubicin, followed by paclitaxel/ carboplatin weekly 12 started 02/26/2015, completed 05/14/2015.  She started having numbness, tingling, and burning sensation of the feet during her chemotherapy.  However, since stopping chemotherapy her paresthesias has persisted.   She also has constant numbness of the hands.  Her burning sensation has improved in her feet, but hands remain unchanged.  She is unable to type as much.   She has tried gabapentin but developed worsening right arm pain, so discontinued it.   She denies any neck or low back pain.  There is no weakness of the hands and feet.   She also complains of right shooting upper arm and chest pain and lymphedema from her previous breast cancer surgery.     Out-side paper records, electronic medical record, and images have been reviewed where available and summarized as:  Lab Results  Component Value Date   NA 137 04/18/2016   K 4.3 04/18/2016   CL 102 12/12/2014   CO2 26 04/18/2016   Lab Results  Component Value Date   WBC 3.6 (L) 04/18/2016   HGB 12.2 04/18/2016   HCT 38.3 04/18/2016   MCV 80.1 04/18/2016   PLT 260 04/18/2016     Past Medical History:  Diagnosis Date  . Anemia   . Breast asymmetry 12/2015  . History of breast cancer 11/2014   right  . History of chemotherapy   . Migraines   . 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 BREAST RECONSTRUCTION Right 06/23/2015   Procedure: ONCOPLASTIC RIGHT BREAST REDUCTION;  Surgeon: Irene Limbo, MD;  Location: [place];  Service: Plastics;  Laterality: Right;  . BREAST REDUCTION SURGERY Left 06/23/2015   Procedure: MAMMARY REDUCTION  (BREAST) LEFT BREAST FOR SYMETRY (BILATERAL BREAST REDUCTION);  Surgeon: Irene Limbo, MD;  Location: [place];  Service: Plastics;  Laterality: Left;  . BREAST REDUCTION SURGERY Left 01/15/2016   Procedure: MAMMARY REDUCTION  (BREAST) LEFT;  Surgeon: Irene Limbo, MD;  Location: [place];  Service: Plastics;  Laterality: Left;  Marland Kitchen MASTOPEXY Left 01/15/2016   Procedure: POSSIBLE LEFT MASTOPEXY;  Surgeon: Irene Limbo, MD;  Location: [place];  Service: Plastics;  Laterality: Left;  . PORT-A-CATH REMOVAL Right 01/15/2016   Procedure: REMOVAL OF RIGHT PORT-A-CATH;  Surgeon: Irene Limbo, MD;  Location: [place];  Service: Plastics;  Laterality: Right;  . PORTACATH PLACEMENT Right 12/16/2014   Procedure: INSERTION PORT-A-CATH WITH ULTRASOUND;  Surgeon: Rolm Bookbinder, MD;  Location: [place];  Service: General;  Laterality: Right;  . RADIOACTIVE SEED GUIDED MASTECTOMY WITH AXILLARY SENTINEL LYMPH NODE BIOPSY Right  06/15/2015   Procedure: RADIOACTIVE SEED GUIDED PARTIAL MASTECTOMY WITH AXILLARY SENTINEL LYMPH NODE BIOPSY;  Surgeon: Rolm Bookbinder, MD;  Location: [place];  Service: General;  Laterality: Right;     Medications:  Outpatient Encounter Prescriptions as of 09/21/2016  Medication Sig  . b complex vitamins tablet  Take 1 tablet by mouth daily.  . ferrous sulfate 325 (65 FE) MG tablet Take 325 mg by mouth daily with breakfast.  . LORazepam (ATIVAN) 0.5 MG tablet Take 1 tablet (0.5 mg total) by mouth at bedtime.  . meloxicam (MOBIC) 15 MG tablet Take 1 tablet (15 mg total) by mouth daily.  . ondansetron (ZOFRAN) 4 MG tablet Take 4 mg by mouth every 8 (eight) hours as needed for nausea or vomiting.  . SUMAtriptan (IMITREX) 50 MG tablet Take 1 tablet (50 mg total) by mouth every 2 (two) hours as needed for migraine. Mayrepeat in 2 hrs if needed.  . vitamin E (VITAMIN E) 400 UNIT capsule Take 1 capsule (400 Units total) by mouth daily.  . nortriptyline (PAMELOR) 10 MG capsule Start nortriptyline 38m at bedtime for 2 week, then increase to 2 tablet at bedtime  . [DISCONTINUED] gabapentin (NEURONTIN) 300 MG capsule Take 1 capsule (300 mg total) by mouth 2 (two) times daily.  . [DISCONTINUED] ibuprofen (ADVIL,MOTRIN) 200 MG tablet Take 400 mg by mouth every 6 (six) hours as needed (for pain). Reported on 05/14/2015  . [DISCONTINUED] oxyCODONE-acetaminophen (PERCOCET) 10-325 MG tablet Take 1 tablet by mouth every 4 (four) hours as needed for pain.   No facility-administered encounter medications on file as of 09/21/2016.      Allergies: No Known Allergies  Family History: Family History  Problem Relation Age of Onset  . Mesothelioma Maternal Grandfather        asbestos exposure, died in his 534s . Heart defect Sister 0  . Breast cancer Paternal Grandmother   . Diabetes Paternal Grandmother   . Cancer Paternal Grandfather        NOS  . Colon cancer Other 640      MGMs brother with colon cancer  . Cancer Other        several of MGF's sisters with cancer NOS  . Liver cancer Other        MGF's brother  . Heart disease Paternal Aunt   . Heart disease Paternal Uncle     Social History: Social History  Substance Use Topics  . Smoking status: Never Smoker  . Smokeless tobacco: Never Used  . Alcohol  use Yes     Comment: rarely   Social History   Social History Narrative   Lives with husband.  They have two children.   She works at [employer].   Highest level of education:  Associates degree    Review of Systems:  CONSTITUTIONAL: No fevers, chills, night sweats, or weight loss.   EYES: No visual changes or eye pain ENT: No hearing changes.  No history of nose bleeds.   RESPIRATORY: No cough, wheezing and shortness of breath.   CARDIOVASCULAR: Negative for chest pain, and palpitations.   GI: Negative for abdominal discomfort, blood in stools or black stools.  No recent change in bowel habits.   GU:  No history of incontinence.   MUSCLOSKELETAL: No history of joint pain or swelling.  No myalgias.   SKIN: Negative for lesions, rash, and itching.   HEMATOLOGY/ONCOLOGY: Negative for prolonged bleeding, bruising easily, and swollen nodes.  +  history of cancer.   ENDOCRINE: Negative for cold or heat intolerance, polydipsia or goiter.   PSYCH:  No depression or anxiety symptoms.   NEURO: As Above.   Vital Signs:  BP (!) 112/56   Pulse 78   Ht 5' 5"  (1.651 m)   Wt 226 lb (102.5 kg)   SpO2 98%   BMI 37.61 kg/m    General Medical Exam:   General:  Well appearing, comfortable.   Eyes/ENT: see cranial nerve examination.   Neck: No masses appreciated.  Full range of motion without tenderness.  No carotid bruits. Respiratory:  Clear to auscultation, good air entry bilaterally.   Cardiac:  Regular rate and rhythm, no murmur.   Extremities:  No deformities, edema, or skin discoloration.  Skin:  No rashes or lesions.  Neurological Exam: MENTAL STATUS including orientation to time, place, person, recent and remote memory, attention span and concentration, language, and fund of knowledge is normal.  Speech is not dysarthric.  CRANIAL NERVES: II:  No visual field defects.  Unremarkable fundi.   III-IV-VI: Pupils equal round and reactive to light.   Normal conjugate, extra-ocular eye movements in all directions of gaze.  No nystagmus.  No ptosis.   V:  Normal facial sensation.    VII:  Normal facial symmetry and movements.  No pathologic facial reflexes.  VIII:  Normal hearing and vestibular function.   IX-X:  Normal palatal movement.   XI:  Normal shoulder shrug and head rotation.   XII:  Normal tongue strength and range of motion, no deviation or fasciculation.  MOTOR:  No atrophy, fasciculations or abnormal movements.  No pronator drift.  Tone is normal.    Right Upper Extremity:    Left Upper Extremity:    Deltoid  5/5   Deltoid  5/5   Biceps  5/5   Biceps  5/5   Triceps  5/5   Triceps  5/5   Wrist extensors  5/5   Wrist extensors  5/5   Wrist flexors  5/5   Wrist flexors  5/5   Finger extensors  5/5   Finger extensors  5/5   Finger flexors  5/5   Finger flexors  5/5   Dorsal interossei  5/5   Dorsal interossei  5/5   Abductor pollicis  5/5   Abductor pollicis  5/5   Tone (Ashworth scale)  0  Tone (Ashworth scale)  0   Right Lower Extremity:    Left Lower Extremity:    Hip flexors  5/5   Hip flexors  5/5   Hip extensors  5/5   Hip extensors  5/5   Knee flexors  5/5   Knee flexors  5/5   Knee extensors  5/5   Knee extensors  5/5   Dorsiflexors  5/5   Dorsiflexors  5/5   Plantarflexors  5/5   Plantarflexors  5/5   Toe extensors  5/5   Toe extensors  5/5   Toe flexors  5/5   Toe flexors  5/5   Tone (Ashworth scale)  0  Tone (Ashworth scale)  0   MSRs:  Reflexes are 1+/4 throughout.  Plantar are down-going.  SENSORY:  Normal and symmetric perception of light touch, pinprick, vibration, and proprioception.  Romberg's sign absent.  Tinel's is negative.  COORDINATION/GAIT: Normal finger-to- nose-finger and heel-to-shin.  Intact rapid alternating movements bilaterally.  Gait narrow based and stable. Tandem and stressed gait intact.    IMPRESSION: Chemotherapy-induced neuropathy.  Her exam is  atypical and does not disclose  sensory loss as would be expected with neuropathy. Motor strength is also intact.  NCS/EMG of the left arm and right leg will be performed to better localize symptoms and guide treatment.  Given that her feet paresthesias have improved, the possibility of entrapment neuropathy in the upper extremities also needs to be considered.  I will also check vitamin B12, folate, TSH, and copper to look for other treatable causes of neuropathy. For symptomatic management, start nortriptyline 75m at bedtime for 2 week, then increase to 2 tablet at bedtime.  Return to clinic in 4 months.   The duration of this appointment visit was 45 minutes of face-to-face time with the patient.  Greater than 50% of this time was spent in counseling, explanation of diagnosis, planning of further management, and coordination of care.   Thank you for allowing me to participate in patient's care.  If I can answer any additional questions, I would be pleased to do so.    Sincerely,    Donika K. PPosey Pronto DO

## 2016-09-21 NOTE — Patient Instructions (Addendum)
1.  NCS/EMG of the right arm and leg 2.  Start nortriptyline 54m at bedtime for 2 week, then increase to 2 tablet at bedtime 3.  Check labs. Your provider has requested that you have labwork completed today. [id number] on [facility] second floor of this building before leaving the office today. You do not need to check in. If you are not called within 15 minutes please check with the front desk.    Return to clinic in 4 months

## 2016-09-22 ENCOUNTER — Encounter: Payer: Self-pay | Admitting: Neurology

## 2016-09-22 LAB — VITAMIN B12: Vitamin B-12: 1352 pg/mL — ABNORMAL HIGH (ref 200–1100)

## 2016-09-22 LAB — FOLATE: Folate: >24 ng/mL (ref >5.4)

## 2016-09-22 NOTE — Telephone Encounter (Signed)
Please advise 

## 2016-09-22 NOTE — Telephone Encounter (Signed)
Patient notified

## 2016-09-22 NOTE — Telephone Encounter (Signed)
No problem, we can do the left arm and right leg.    Susan K. Posey Pronto, DO

## 2016-09-24 LAB — COPPER, SERUM: COPPER: 146 ug/dL (ref 70–175)

## 2016-09-26 ENCOUNTER — Telehealth: Payer: Self-pay | Admitting: *Deleted

## 2016-09-26 NOTE — Telephone Encounter (Signed)
-----   Message from Alda Berthold, DO sent at 09/26/2016  8:30 AM EDT ----- Please notify patient lab are within normal limits.  Thank you.

## 2016-09-26 NOTE — Telephone Encounter (Signed)
Patient given results.

## 2016-10-13 ENCOUNTER — Encounter: Payer: PRIVATE HEALTH INSURANCE | Admitting: Neurology

## 2016-10-24 ENCOUNTER — Telehealth: Payer: Self-pay | Admitting: Neurology

## 2016-10-24 NOTE — Telephone Encounter (Signed)
Informed patient that this is the only test that we can do to find out what if anything is going on with her nerves.

## 2016-10-24 NOTE — Telephone Encounter (Signed)
Caller: Jozette  Urgent? No  Reason for the call: She is scheduled for an EMG on Tuesday 6/19 and she was wanting to know was there any other Nerve test that could be done other than the EMG? Please Advise. Thanks

## 2016-10-25 ENCOUNTER — Encounter: Payer: PRIVATE HEALTH INSURANCE | Admitting: Neurology

## 2016-11-01 ENCOUNTER — Encounter: Payer: PRIVATE HEALTH INSURANCE | Admitting: Neurology

## 2016-12-22 ENCOUNTER — Other Ambulatory Visit: Payer: Medicaid Other

## 2016-12-26 ENCOUNTER — Other Ambulatory Visit (HOSPITAL_BASED_OUTPATIENT_CLINIC_OR_DEPARTMENT_OTHER): Payer: PRIVATE HEALTH INSURANCE

## 2016-12-26 DIAGNOSIS — C50311 Malignant neoplasm of lower-inner quadrant of right female breast: Secondary | ICD-10-CM | POA: Diagnosis not present

## 2016-12-26 DIAGNOSIS — Z171 Estrogen receptor negative status [ER-]: Secondary | ICD-10-CM

## 2016-12-26 LAB — CBC WITH DIFFERENTIAL/PLATELET
BASO%: 0.4 % (ref 0.0–2.0)
BASOS ABS: 0 10*3/uL (ref 0.0–0.1)
EOS ABS: 0.1 10*3/uL (ref 0.0–0.5)
EOS%: 1.5 % (ref 0.0–7.0)
HCT: 38.4 % (ref 34.8–46.6)
HEMOGLOBIN: 12.4 g/dL (ref 11.6–15.9)
LYMPH#: 1.2 10*3/uL (ref 0.9–3.3)
LYMPH%: 21.6 % (ref 14.0–49.7)
MCH: 26.4 pg (ref 25.1–34.0)
MCHC: 32.3 g/dL (ref 31.5–36.0)
MCV: 81.9 fL (ref 79.5–101.0)
MONO#: 0.4 10*3/uL (ref 0.1–0.9)
MONO%: 7.2 % (ref 0.0–14.0)
NEUT#: 3.8 10*3/uL (ref 1.5–6.5)
NEUT%: 69.3 % (ref 38.4–76.8)
Platelets: 245 10*3/uL (ref 145–400)
RBC: 4.69 10*6/uL (ref 3.70–5.45)
RDW: 14.4 % (ref 11.2–14.5)
WBC: 5.4 10*3/uL (ref 3.9–10.3)

## 2016-12-26 LAB — COMPREHENSIVE METABOLIC PANEL
ALBUMIN: 3.8 g/dL (ref 3.5–5.0)
ALK PHOS: 74 U/L (ref 40–150)
ALT: 17 U/L (ref 0–55)
AST: 16 U/L (ref 5–34)
Anion Gap: 7 mEq/L (ref 3–11)
BUN: 10.4 mg/dL (ref 7.0–26.0)
CALCIUM: 9.6 mg/dL (ref 8.4–10.4)
CHLORIDE: 104 meq/L (ref 98–109)
CO2: 28 mEq/L (ref 22–29)
Creatinine: 0.8 mg/dL (ref 0.6–1.1)
Glucose: 102 mg/dl (ref 70–140)
POTASSIUM: 4.2 meq/L (ref 3.5–5.1)
Sodium: 138 mEq/L (ref 136–145)
Total Bilirubin: 0.82 mg/dL (ref 0.20–1.20)
Total Protein: 7.4 g/dL (ref 6.4–8.3)

## 2016-12-27 LAB — CANCER ANTIGEN 27.29: CA 27.29: 18.2 U/mL (ref 0.0–38.6)

## 2016-12-29 ENCOUNTER — Ambulatory Visit (HOSPITAL_BASED_OUTPATIENT_CLINIC_OR_DEPARTMENT_OTHER): Payer: PRIVATE HEALTH INSURANCE | Admitting: Oncology

## 2016-12-29 VITALS — BP 147/93 | HR 87 | Temp 98.7°F | Resp 18 | Ht 65.0 in | Wt 222.1 lb

## 2016-12-29 DIAGNOSIS — Z171 Estrogen receptor negative status [ER-]: Secondary | ICD-10-CM | POA: Diagnosis not present

## 2016-12-29 DIAGNOSIS — R600 Localized edema: Secondary | ICD-10-CM

## 2016-12-29 DIAGNOSIS — C50311 Malignant neoplasm of lower-inner quadrant of right female breast: Secondary | ICD-10-CM

## 2016-12-29 NOTE — Progress Notes (Signed)
[place]  [phone]) [phone] [phone]) [phone]     ID: Susan Wilson DOB: [birth year]  MR#: [id number]  JYN#:[id number]  Patient Care Team: Donnamae Jude, MD as PCP - General (Obstetrics and Gynecology) Rolm Bookbinder, MD as Consulting Physician (General Surgery) Magrinat, Virgie Dad, MD as Consulting Physician (Oncology) [name], RN as Registered Nurse Rockwell Germany, RN as Registered Nurse Alda Berthold, DO as Consulting Physician (Neurology) PCP: Donnamae Jude, MD OTHER MD:  CHIEF COMPLAINT: Triple negative breast cancer  CURRENT TREATMENT: Observation  BREAST CANCER HISTORY: From the original intake note:  Susan Wilson herself noted a mass in her right breast sometime around April. Initially she thought it might be related to menstruation, but as it did not change and eventually became tender, she brought it to her physician's attention. On 11/20/2014 patient underwent bilateral diagnostic mammography with tomosynthesis and right breast ultrasonography at the breast Center. The breast density was category B. There was a hyperdense mass in the right lower inner quadrant associated with skin thickening. There was also a 5 mm nodule posteriorly at the 8:30 o'clock position in the right breast. There were several hyperdense nodules in the right axilla. On physical exam there was a firm fixed mass in the right breast at the 5:00 position. By ultrasound this was lobulated and appear to involve the skin. It measures up to 4.1 cm. There was no sonographic correlation to the 5 mm nodule seen in a different area of the right breast. The right axilla showed 3 hypervascular lymph nodes with prominent cortical thickening, measuring less than 1.5 cm.  On 11/21/2014 the patient underwent right breast biopsy (5:00 mass) and biopsy of one of the suspicious right axillary lymph nodes. The pathology (SAA [id number]) showed the breast biopsy to consist of invasive  ductal carcinoma, grade 2, estrogen receptor and progesterone receptor negative, with an MIB-1 of 20%, and HER-2 equivocal, with the signals ratio of 1.41, but the average copy number per cell 4.35.  The patient's subsequent history is as detailed below  INTERVAL HISTORY:  Susan Wilson returns today for follow-up of her triple negative breast cancer. She just finished physical therapy, although she thinks she will need a little bit more before she can be fully functional. She has ongoing right upper extremity edema. She has daytime and nighttime compression sleeves. She does do manual massages regularly. She does not believe she will be able to get back to work for at a minimum and another month.  She met with neurology, Dr. Posey Pronto, and was changed from gabapentin to more awake and Pamelor. That is taking care of the burning discomfort in her fingers and toes but of course the numbness persists. Dr. Posey Pronto feels her may be an element of entrapment neuropathy as well and this is being evaluated  REVIEW OF SYSTEMS: Emonii  is limited in her range of motion in upper extremity strength on the right. She also has significant pain in the right axilla and under the right breast. There had been no unusual headaches, visual changes, dizziness, gait imbalance, nausea, or vomiting. She denies cough, phlegm production, or pleurisy. A detailed review of systems today was otherwise stable  PAST MEDICAL HISTORY: Past Medical History:  Diagnosis Date  . Anemia   . Breast asymmetry 12/2015  . History of breast cancer 11/2014   right  . History of chemotherapy   . Migraines   . 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 BREAST RECONSTRUCTION Right 06/23/2015   Procedure: ONCOPLASTIC RIGHT BREAST REDUCTION;  Surgeon: Irene Limbo, MD;  Location: [facility];  Service: Plastics;  Laterality:  Right;  . BREAST REDUCTION SURGERY Left 06/23/2015   Procedure: MAMMARY REDUCTION  (BREAST) LEFT BREAST FOR SYMETRY (BILATERAL BREAST REDUCTION);  Surgeon: Irene Limbo, MD;  Location: [facility];  Service: Plastics;  Laterality: Left;  . BREAST REDUCTION SURGERY Left 01/15/2016   Procedure: MAMMARY REDUCTION  (BREAST) LEFT;  Surgeon: Irene Limbo, MD;  Location: [place];  Service: Plastics;  Laterality: Left;  Marland Kitchen MASTOPEXY Left 01/15/2016   Procedure: POSSIBLE LEFT MASTOPEXY;  Surgeon: Irene Limbo, MD;  Location: [facility];  Service: Plastics;  Laterality: Left;  . PORT-A-CATH REMOVAL Right 01/15/2016   Procedure: REMOVAL OF RIGHT PORT-A-CATH;  Surgeon: Irene Limbo, MD;  Location: [facility];  Service: Plastics;  Laterality: Right;  . PORTACATH PLACEMENT Right 12/16/2014   Procedure: INSERTION PORT-A-CATH WITH ULTRASOUND;  Surgeon: Rolm Bookbinder, MD;  Location: [facility];  Service: General;  Laterality: Right;  . RADIOACTIVE SEED GUIDED MASTECTOMY WITH AXILLARY SENTINEL LYMPH NODE BIOPSY Right 06/15/2015   Procedure: RADIOACTIVE SEED GUIDED PARTIAL MASTECTOMY WITH AXILLARY SENTINEL LYMPH NODE BIOPSY;  Surgeon: Rolm Bookbinder, MD;  Location: [facility];  Service: General;  Laterality: Right;    FAMILY HISTORY Family History  Problem Relation Age of Onset  . Mesothelioma Maternal Grandfather        asbestos exposure, died in his 83s  . Heart defect Sister 0  . Breast cancer Paternal Grandmother   . Diabetes Paternal Grandmother   . Cancer Paternal Grandfather        NOS  . Colon cancer Other 65       MGMs brother with colon cancer  . Cancer Other        several of MGF's sisters with cancer NOS  . Liver cancer Other        MGF's brother  . Heart disease Paternal Aunt   . Heart disease Paternal Uncle    the patient's parents are living, her father being 55 and her mother 33 as of July 2016. The  patient had no brothers. One sister died at age 66 from cardiac problems. The other sister is in good health. On the maternal side there is a history of colon cancer and an uncle age 8, liver cancer in a great uncle and mesothelioma in the maternal grandfather.  GYNECOLOGIC HISTORY:  No LMP recorded. Patient is not currently having periods (Reason: Chemotherapy). Menarche age 33. The patient is GX P2. Her periods were interrupted with chemotherapy, but have resumed (August 2018), although now very scant and brief  SOCIAL HISTORY:  Susan Wilson works in Therapist, art for [employer]. Her husband Antrell works for [employer]. The daughters are Susan Wilson and Susan Wilson, age 14 and 78. The patient attends a local [employer]: Not in place   HEALTH MAINTENANCE: Social History  Substance Use Topics  . Smoking status: Never Smoker  . Smokeless tobacco: Never Used  . Alcohol use Yes     Comment: rarely     Colonoscopy:  PAP: 2014  Bone density:  Lipid panel:  No Known Allergies  Current Outpatient Prescriptions  Medication Sig Dispense Refill  . b complex vitamins tablet Take 1 tablet by mouth daily.    . ferrous sulfate 325 (  65 FE) MG tablet Take 325 mg by mouth daily with breakfast.    . LORazepam (ATIVAN) 0.5 MG tablet Take 1 tablet (0.5 mg total) by mouth at bedtime. 30 tablet 0  . meloxicam (MOBIC) 15 MG tablet Take 1 tablet (15 mg total) by mouth daily. 60 tablet 5  . nortriptyline (PAMELOR) 10 MG capsule Start nortriptyline 57m at bedtime for 2 week, then increase to 2 tablet at bedtime 60 capsule 5  . ondansetron (ZOFRAN) 4 MG tablet Take 4 mg by mouth every 8 (eight) hours as needed for nausea or vomiting.    . SUMAtriptan (IMITREX) 50 MG tablet Take 1 tablet (50 mg total) by mouth every 2 (two) hours as needed for migraine. Mayrepeat in 2 hrs if needed. 10 tablet 5  . vitamin E (VITAMIN E) 400 UNIT capsule Take 1 capsule (400 Units total)  by mouth daily. 30 capsule 0   No current facility-administered medications for this visit.     OBJECTIVE: Young African-American woman Who appears stated age V4   12/29/16 1515  BP: (!) 147/93  Pulse: 87  Resp: 18  Temp: 98.7 F (37.1 C)  SpO2: 100%     Body mass index is 36.96 kg/m.    ECOG FS:1 - Symptomatic but completely ambulatory  Sclerae unicteric, EOMs intact Oropharynx clear and moist No cervical or supraclavicular adenopathy Lungs no rales or rhonchi Heart regular rate and rhythm Abd soft, nontender, positive bowel sounds MSK no focal spinal tenderness, grade 1 right upper extremity lymphedema Neuro: nonfocal, well oriented, appropriate affect Breasts: She has undergone bilateral reduction mammoplasty. She is also status post right lumpectomy and radiation. There is no evidence of local recurrence. Both axillae are benign.  LAB RESULTS:  CMP  CBC Latest Ref Rng & Units 12/26/2016 04/18/2016 12/03/2015  WBC 3.9 - 10.3 10e3/uL 5.4 3.6(L) 3.6(L)  Hemoglobin 11.6 - 15.9 g/dL 12.4 12.2 11.9  Hematocrit 34.8 - 46.6 % 38.4 38.3 37.1  Platelets 145 - 400 10e3/uL 245 260 225   CMP Latest Ref Rng & Units 12/26/2016 04/18/2016 12/03/2015  Glucose 70 - 140 mg/dl 102 102 100  BUN 7.0 - 26.0 mg/dL 10.4 12.4 12.0  Creatinine 0.6 - 1.1 mg/dL 0.8 0.8 0.8  Sodium 136 - 145 mEq/L 138 137 138  Potassium 3.5 - 5.1 mEq/L 4.2 4.3 4.1  Chloride 101 - 111 mmol/L - - -  CO2 22 - 29 mEq/L 28 26 26   Calcium 8.4 - 10.4 mg/dL 9.6 9.3 9.4  Total Protein 6.4 - 8.3 g/dL 7.4 7.6 7.8  Total Bilirubin 0.20 - 1.20 mg/dL 0.82 0.61 0.81  Alkaline Phos 40 - 150 U/L 74 96 79  AST 5 - 34 U/L 16 18 21   ALT 0 - 55 U/L 17 20 26       Urinalysis    Component Value Date/Time   LABSPEC 1.020 05/06/2009 1439   PHURINE 7.0 05/06/2009 1439   GLUCOSEU NEGATIVE 05/06/2009 1439   HGBUR NEGATIVE 05/06/2009 1439   BILIRUBINUR NEGATIVE 05/06/2009 1439   KETONESUR NEGATIVE 05/06/2009 1439   PROTEINUR  NEGATIVE 05/06/2009 1439   UROBILINOGEN 0.2 05/06/2009 1439   NITRITE NEGATIVE 05/06/2009 1439   LEUKOCYTESUR  05/06/2009 1439    NEGATIVE Biochemical Testing Only. Please order routine urinalysis from main lab if confirmatory testing is needed.    STUDIES: No results found.  ASSESSMENT: 41y.o. BRCA negative [place] woman s/p Right breast biopsy lower inner quadrant 11/21/2014 for a clinical T2 NX, stage 2 invasive  ductal carcinoma, grade 2 or 3, estrogen and progesterone receptor negative, HER-2 equivocal, with an Mib-1 of 20%  (a) biopsy of a suspicious axillary lymph node same day was negative, but discordant  (b) review of 80 additional tumor cells by FISH still showed HER-2 not amplified; tumor should be treated as a triple negative  (1) neoadjuvant chemotherapy started 01/01/2015 consisting of cyclophosphamide and doxorubicin in dose dense fashion 4, completed 02/13/2015, followed by paclitaxel/ carboplatin weekly 12 started 02/26/2015, completed 05/14/2015.   (2) genetics testing 12/15/2014 through the Breast/Ovarian gene panel offered by [facility] found no deleterious mutations in ATM, BARD1, BRCA1, BRCA2, BRIP1, CDH1, CHEK2, EPCAM, FANCC, MLH1, MSH2, MSH6, NBN, PALB2, PMS2, PTEN, RAD51C, RAD51D, TP53, and XRCC2  (3) right lumpectomy and sentinel lymph node sampling 06/15/2015 showed a residual pT1b pN0 invasive ductal carcinoma, grade 1, with negative margins. (There were actually a few microscopic nests of residual tumor and 0.9 is the longest distance between 2 of the microscopic nests--there was not enough tissue for repeat prognostic panel.)  (a) bilateral reduction mammoplasty 06/23/2015 showed no malignancy in either breast  (4) adjuvant radiation 09/01/2015-10/19/2015:  Right breast / 45 Gray @ 1.8 Pearline Cables per fraction x 25 fractions Right breast boost / 16 Gray at [employer] per fraction x 8 fractions    PLAN: Joee is now a year and a half out from definitive surgery for  her breast cancer with no evidence of disease recurrence. This is very favorable as triple-negative breast cancers, if we are going to recur, tend to recur early.  Her menses have recurred, although not vigorously so. They are using contraception and she understands until she goes a full year with no suggestion of a. She cannot be considered postmenopausal.  We discussed the use of goserelin, which in young patients with estrogen receptor positive tumors would be recommended at this point. We do not have data in estrogen receptor negative patients as they were excluded from the SOFT/TEXT studies. She prefers to continue her use of barrier contraceptives at this point.  She is due for repeat mammography and I have entered that order.  She just saw Dr. Donne Hazel and tells me she will see him again in 6 months which will be about February. Accordingly I will see her again in August or September of next year, after her 2019 mammogram.  She knows to call for any problems that may develop before the next visit.    Chauncey Cruel, MD   12/29/2016 3:49 PM

## 2017-01-18 ENCOUNTER — Ambulatory Visit: Payer: PRIVATE HEALTH INSURANCE | Admitting: Neurology

## 2017-01-31 NOTE — Progress Notes (Deleted)
Follow-up Visit   Date: ***   Susan Wilson MRN: [id number] DOB: 09/11/1975   Interim History: Susan Wilson is a 41 y.o. right-handed African American female with invasive ductal carcinoma of the right breast (2016) s/p chemotherapy and radiation and mass resection (05/2015) returning to the clinic for follow-up of ***.  The patient was accompanied to the clinic by *** who also provides collateral information.    History of present illness: She was diagnosed with right invasive ductal carcinoma of the right breast in July  2016.  She completed chemotherapy with cyclophosphamideand doxorubicin, followed by paclitaxel/ carboplatinweekly 12 started 02/26/2015, completed 05/14/2015.  She started having numbness, tingling, and burning sensation of the feet during her chemotherapy.  However, since stopping chemotherapy her paresthesias has persisted.   She also has constant numbness of the hands.  Her burning sensation has improved in her feet, but hands remain unchanged.  She is unable to type as much.   She has tried gabapentin but developed worsening right arm pain, so discontinued it.   She denies any neck or low back pain.  There is no weakness of the hands and feet.   She also complains of right shooting upper arm and chest pain and lymphedema from her previous breast cancer surgery.     UPDATE ***:  Medications:  Current Outpatient Prescriptions on File Prior to Visit  Medication Sig Dispense Refill  . b complex vitamins tablet Take 1 tablet by mouth daily.    . ferrous sulfate 325 (65 FE) MG tablet Take 325 mg by mouth daily with breakfast.    . LORazepam (ATIVAN) 0.5 MG tablet Take 1 tablet (0.5 mg total) by mouth at bedtime. 30 tablet 0  . meloxicam (MOBIC) 15 MG tablet Take 1 tablet (15 mg total) by mouth daily. 60 tablet 5  . nortriptyline (PAMELOR) 10 MG capsule Start nortriptyline 31m at bedtime for 2 week, then increase to 2 tablet at bedtime 60  capsule 5  . ondansetron (ZOFRAN) 4 MG tablet Take 4 mg by mouth every 8 (eight) hours as needed for nausea or vomiting.    . SUMAtriptan (IMITREX) 50 MG tablet Take 1 tablet (50 mg total) by mouth every 2 (two) hours as needed for migraine. Mayrepeat in 2 hrs if needed. 10 tablet 5  . vitamin E (VITAMIN E) 400 UNIT capsule Take 1 capsule (400 Units total) by mouth daily. 30 capsule 0   No current facility-administered medications on file prior to visit.     Allergies: No Known Allergies  Review of Systems:  CONSTITUTIONAL: No fevers, chills, night sweats, or weight loss.  EYES: No visual changes or eye pain ENT: No hearing changes.  No history of nose bleeds.   RESPIRATORY: No cough, wheezing and shortness of breath.   CARDIOVASCULAR: Negative for chest pain, and palpitations.   GI: Negative for abdominal discomfort, blood in stools or black stools.  No recent change in bowel habits.   GU:  No history of incontinence.   MUSCLOSKELETAL: No history of joint pain or swelling.  No myalgias.   SKIN: Negative for lesions, rash, and itching.   ENDOCRINE: Negative for cold or heat intolerance, polydipsia or goiter.   PSYCH:  *** depression or anxiety symptoms.   NEURO: As Above.   Vital Signs:  There were no vitals taken for this visit. Pain Scale: *** on a scale of 0-10   General: *** Neck: *** carotid bruit CV: *** Ext: ***  Neurological  Exam: MENTAL STATUS including orientation to time, place, person, recent and remote memory, attention span and concentration, language, and fund of knowledge is ***normal.  Speech is not dysarthric.  CRANIAL NERVES: No visual field defects. *** Pupils equal round and reactive to light.  Normal conjugate, extra-ocular eye movements in all directions of gaze.  No ptosis ***. Normal facial sensation.  Face is symmetric. Palate elevates symmetrically.  Tongue is midline.  MOTOR:  Motor strength is 5/5 in all extremities, except ***.  No atrophy,  fasciculations or abnormal movements.  No pronator drift.  Tone is normal.    MSRs:  Reflexes are 2+/4 throughout, except ***.  SENSORY:  Intact to ***.  COORDINATION/GAIT:  Normal finger-to- nose-finger and heel-to-shin.  Intact rapid alternating movements bilaterally.  Gait narrow based and stable.   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  She is tolerating nortriptyline 20 mg at bedtime. ***  Return to clinic in 4 months.  PLAN/RECOMMENDATIONS:  ***   The duration of this appointment visit was *** minutes of face-to-face time with the patient.  Greater than 50% of this time was spent in counseling, explanation of diagnosis, planning of further management, and coordination of care.   Thank you for allowing me to participate in patient's care.  If I can answer any additional questions, I would be pleased to do so.    Sincerely,    Jamison Soward K. Posey Pronto, DO

## 2017-02-01 ENCOUNTER — Ambulatory Visit: Payer: PRIVATE HEALTH INSURANCE | Admitting: Neurology

## 2017-02-09 ENCOUNTER — Encounter: Payer: Self-pay | Admitting: Neurology

## 2017-02-15 ENCOUNTER — Ambulatory Visit
Admission: RE | Admit: 2017-02-15 | Discharge: 2017-02-15 | Disposition: A | Discharge status: Home / Self Care | Payer: PRIVATE HEALTH INSURANCE | Source: Ambulatory Visit | Attending: Oncology | Admitting: Oncology

## 2017-02-15 DIAGNOSIS — Z171 Estrogen receptor negative status [ER-]: Principal | ICD-10-CM

## 2017-02-15 DIAGNOSIS — C50311 Malignant neoplasm of lower-inner quadrant of right female breast: Secondary | ICD-10-CM

## 2017-02-15 HISTORY — DX: Personal history of antineoplastic chemotherapy: Z92.21

## 2017-02-15 HISTORY — DX: Personal history of irradiation: Z92.3

## 2017-05-19 ENCOUNTER — Ambulatory Visit: Payer: PRIVATE HEALTH INSURANCE | Admitting: Neurology

## 2017-06-20 ENCOUNTER — Telehealth: Payer: Self-pay | Admitting: Adult Health

## 2017-06-20 NOTE — Telephone Encounter (Signed)
Susan Wilson called me today wanting your opinion on what birth control she could start.  She has had a return of her menses.  These returned back in 12/2016 with some minimal vaginal spotting that lasted x 1-2 days.  Since then she has had continued intermittent vaginal spotting with no specific pattern.  These are associated with cramping and abdominal pain.  Her goal is return to birth control, to where she had more predictable, less painful menstrual cycles, or to stop menstruating all together.  She wants to know what birth control, if any that she can take.    Reminder of her cancer history:  42 y/o BRCA neg, with clinical stage II IDC, grade 3, triple negative, Ki-67 20%.  She underwent treatment with neoadjuvant chemotherapy with Doxorubicin and Cyclophosphamide x 4, followed by Paclitaxel and Carboplatin x 12.  She underwent lumpectomy with residual T1b IDC, grade 1, and negative margins, she also underwent radiation.    I am happy to call her back if you will let me know your thoughts.    Thanks,   Mendel Ryder

## 2017-06-21 ENCOUNTER — Telehealth: Payer: Self-pay | Admitting: Adult Health

## 2017-06-21 ENCOUNTER — Telehealth: Payer: Self-pay | Admitting: Oncology

## 2017-06-21 NOTE — Telephone Encounter (Signed)
Gave patient information about Dr. Jana Hakim recommendation for Goserelin.  I reviewed side effects with her.  She is not happy with the Goserelin recommendation.  She wants to talk more in detail about other birth control options and know what Dr. Virgie Dad opinion is on those and has questions to ask him about those.  She also wants to know what to do about her migraines from Dr. Jana Hakim.  I told patient that it would be best for her to come in for an appointment to discuss these issues with Dr. Jana Hakim.  She verbalized understanding.  Schedule message sent.    Wilber Bihari, NP

## 2017-06-21 NOTE — Telephone Encounter (Signed)
Scheduled appt per 2/6 sch message - patient is aware of appt date and time

## 2017-06-26 NOTE — Progress Notes (Signed)
[facility]  Telephone:([phone] Fax:([phone]) [phone]     ID: Susan Wilson DOB: June 01, 1975  MR#: [id number]  VHQ#:[id number]  Patient Care Team: Donnamae Jude, MD as PCP - General (Obstetrics and Gynecology) Rolm Bookbinder, MD as Consulting Physician (General Surgery) Mcadoo Muzquiz, Virgie Dad, MD as Consulting Physician (Oncology) [name], RN as Registered Nurse Rockwell Germany, RN as Registered Nurse Alda Berthold, DO as Consulting Physician (Neurology) PCP: Donnamae Jude, MD OTHER MD:  CHIEF COMPLAINT: Triple negative breast cancer  CURRENT TREATMENT: Observation  BREAST CANCER HISTORY: From the original intake note:  Susan Wilson herself noted a mass in her right breast sometime around April. Initially she thought it might be related to menstruation, but as it did not change and eventually became tender, she brought it to her physician's attention. On 11/20/2014 patient underwent bilateral diagnostic mammography with tomosynthesis and right breast ultrasonography at the [facility] Center. The breast density was category B. There was a hyperdense mass in the right lower inner quadrant associated with skin thickening. There was also a 5 mm nodule posteriorly at the 8:30 o'clock position in the right breast. There were several hyperdense nodules in the right axilla. On physical exam there was a firm fixed mass in the right breast at the 5:00 position. By ultrasound this was lobulated and appear to involve the skin. It measures up to 4.1 cm. There was no sonographic correlation to the 5 mm nodule seen in a different area of the right breast. The right axilla showed 3 hypervascular lymph nodes with prominent cortical thickening, measuring less than 1.5 cm.  On 11/21/2014 the patient underwent right breast biopsy (5:00 mass) and biopsy of one of the suspicious right axillary lymph nodes. The pathology ([id number]) showed the breast biopsy to consist of invasive  ductal carcinoma, grade 2, estrogen receptor and progesterone receptor negative, with an MIB-1 of 20%, and HER-2 equivocal, with the signals ratio of 1.41, but the average copy number per cell 4.35.  The patient's subsequent history is as detailed below  INTERVAL HISTORY:  Susan Wilson returns today for follow-up of her triple negative breast cancer. She is doing well overall.  Since her last visit to the office, she underwent diagnostic bilateral mammography with tomography at the Breast center on 02/15/2017 that showed: Breast density category B. There was no mammographic evidence for malignancy.   REVIEW OF SYSTEMS: Susan Wilson reports that she is consuming more water, eating more healthy by consuming more veggies. For exercise, she is walking more on the treadmill approximately 3 days a week. She denies unusual headaches, visual changes, nausea, vomiting, or dizziness. There has been no unusual cough, phlegm production, or pleurisy. This been no change in bowel or bladder habits. She denies unexplained fatigue or unexplained weight loss, bleeding, rash, or fever. A detailed review of systems was otherwise stable.    PAST MEDICAL HISTORY: Past Medical History:  Diagnosis Date  . Anemia   . Breast asymmetry 12/2015  . Breast cancer ([facility]) 2017   right breast  . History of breast cancer 11/2014   right  . History of chemotherapy   . Migraines   . 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 Personal history of chemotherapy 2017  . Personal history of radiation therapy 2017    PAST SURGICAL HISTORY: Past Surgical History:  Procedure Laterality Date  . BREAST LUMPECTOMY Right 2017  . BREAST RECONSTRUCTION Right 06/23/2015   Procedure: ONCOPLASTIC RIGHT  BREAST REDUCTION;  Surgeon: Irene Limbo, MD;  Location: [facility];  Service: Plastics;  Laterality: Right;  . BREAST REDUCTION SURGERY Left 06/23/2015   Procedure: MAMMARY REDUCTION  (BREAST) LEFT  BREAST FOR SYMETRY (BILATERAL BREAST REDUCTION);  Surgeon: Irene Limbo, MD;  Location: [facility];  Service: Plastics;  Laterality: Left;  . BREAST REDUCTION SURGERY Left 01/15/2016   Procedure: MAMMARY REDUCTION  (BREAST) LEFT;  Surgeon: Irene Limbo, MD;  Location: [facility];  Service: Plastics;  Laterality: Left;  Marland Kitchen MASTOPEXY Left 01/15/2016   Procedure: POSSIBLE LEFT MASTOPEXY;  Surgeon: Irene Limbo, MD;  Location: [facility];  Service: Plastics;  Laterality: Left;  . PORT-A-CATH REMOVAL Right 01/15/2016   Procedure: REMOVAL OF RIGHT PORT-A-CATH;  Surgeon: Irene Limbo, MD;  Location: [facility];  Service: Plastics;  Laterality: Right;  . PORTACATH PLACEMENT Right 12/16/2014   Procedure: INSERTION PORT-A-CATH WITH ULTRASOUND;  Surgeon: Rolm Bookbinder, MD;  Location: [facility];  Service: General;  Laterality: Right;  . RADIOACTIVE SEED GUIDED PARTIAL MASTECTOMY WITH AXILLARY SENTINEL LYMPH NODE BIOPSY Right 06/15/2015   Procedure: RADIOACTIVE SEED GUIDED PARTIAL MASTECTOMY WITH AXILLARY SENTINEL LYMPH NODE BIOPSY;  Surgeon: Rolm Bookbinder, MD;  Location: [facility];  Service: General;  Laterality: Right;  . REDUCTION MAMMAPLASTY Bilateral 2017    FAMILY HISTORY Family History  Problem Relation Age of Onset  . Mesothelioma Maternal Grandfather        asbestos exposure, died in his 1s  . Heart defect Sister 0  . Breast cancer Paternal Grandmother   . Diabetes Paternal Grandmother   . Cancer Paternal Grandfather        NOS  . Colon cancer Other 88       MGMs brother with colon cancer  . Cancer Other        several of MGF's sisters with cancer NOS  . Liver cancer Other        MGF's brother  . Heart disease Paternal Aunt   . Heart disease Paternal Uncle    the patient's parents are living, her father being 74 and her mother 29 as of July 2016. The patient had no brothers. One sister died at age 70  from cardiac problems. The other sister is in good health. On the maternal side there is a history of colon cancer and an uncle age 24, liver cancer in a great uncle and mesothelioma in the maternal grandfather.  GYNECOLOGIC HISTORY:  No LMP recorded. Menarche age 73. The patient is GX P2. Her periods were interrupted with chemotherapy, but have resumed (August 2018), although now very scant and brief  SOCIAL HISTORY:  Keari works in Therapist, art for [employer]. Her husband Antrell works for [employer]. The daughters are [place] and Lovie Macadamia, age 54 and 63. The patient attends a local [place]: Not in place   HEALTH MAINTENANCE: Social History   Tobacco Use  . Smoking status: Never Smoker  . Smokeless tobacco: Never Used  Substance Use Topics  . Alcohol use: Yes    Comment: rarely  . Drug use: No     Colonoscopy:  PAP: 2014  Bone density:  Lipid panel:  No Known Allergies  Current Outpatient Medications  Medication Sig Dispense Refill  . b complex vitamins tablet Take 1 tablet by mouth daily.    . ferrous sulfate 325 (65 FE) MG tablet Take 325 mg by mouth daily with breakfast.    .  LORazepam (ATIVAN) 0.5 MG tablet Take 1 tablet (0.5 mg total) by mouth at bedtime. 30 tablet 0  . meloxicam (MOBIC) 15 MG tablet Take 1 tablet (15 mg total) by mouth daily. 60 tablet 5  . nortriptyline (PAMELOR) 10 MG capsule Start nortriptyline 78m at bedtime for 2 week, then increase to 2 tablet at bedtime 60 capsule 5  . ondansetron (ZOFRAN) 4 MG tablet Take 4 mg by mouth every 8 (eight) hours as needed for nausea or vomiting.    . SUMAtriptan (IMITREX) 50 MG tablet Take 1 tablet (50 mg total) by mouth every 2 (two) hours as needed for migraine. Mayrepeat in 2 hrs if needed. 10 tablet 5  . vitamin E (VITAMIN E) 400 UNIT capsule Take 1 capsule (400 Units total) by mouth daily. 30 capsule 0   No current facility-administered medications for  this visit.     OBJECTIVE: Susan Wilson in no acute distress  Vitals:   07/03/17 1110  BP: 127/88  Pulse: 79  Resp: 20  Temp: 98.9 F (37.2 C)  SpO2: 100%     Body mass index is 37.33 kg/m.    ECOG FS:1 - Symptomatic but completely ambulatory  Sclerae unicteric, pupils round and equal Oropharynx clear and moist No cervical or supraclavicular adenopathy Lungs no rales or rhonchi Heart regular rate and rhythm Abd soft, nontender, positive bowel sounds MSK no focal spinal tenderness, no upper extremity lymphedema Neuro: nonfocal, well oriented, appropriate affect Breasts: The right breast is status post lumpectomy and radiation.  There is no evidence of local recurrence.  Both breasts are status post reduction mammoplasty, with good cosmetic result.  Both axillae are benign  LAB RESULTS:  CMP  CBC Latest Ref Rng & Units 12/26/2016 04/18/2016 12/03/2015  WBC 3.9 - 10.3 10e3/uL 5.4 3.6(L) 3.6(L)  Hemoglobin 11.6 - 15.9 g/dL 12.4 12.2 11.9  Hematocrit 34.8 - 46.6 % 38.4 38.3 37.1  Platelets 145 - 400 10e3/uL 245 260 225   CMP Latest Ref Rng & Units 12/26/2016 04/18/2016 12/03/2015  Glucose 70 - 140 mg/dl 102 102 100  BUN 7.0 - 26.0 mg/dL 10.4 12.4 12.0  Creatinine 0.6 - 1.1 mg/dL 0.8 0.8 0.8  Sodium 136 - 145 mEq/L 138 137 138  Potassium 3.5 - 5.1 mEq/L 4.2 4.3 4.1  Chloride 101 - 111 mmol/L - - -  CO2 22 - 29 mEq/L 28 26 26   Calcium 8.4 - 10.4 mg/dL 9.6 9.3 9.4  Total Protein 6.4 - 8.3 g/dL 7.4 7.6 7.8  Total Bilirubin 0.20 - 1.20 mg/dL 0.82 0.61 0.81  Alkaline Phos 40 - 150 U/L 74 96 79  AST 5 - 34 U/L 16 18 21   ALT 0 - 55 U/L 17 20 26       Urinalysis    Component Value Date/Time   LABSPEC 1.020 05/06/2009 1439   PHURINE 7.0 05/06/2009 1439   GLUCOSEU NEGATIVE 05/06/2009 1439   HGBUR NEGATIVE 05/06/2009 1439   BILIRUBINUR NEGATIVE 05/06/2009 1439   KETONESUR NEGATIVE 05/06/2009 1439   PROTEINUR NEGATIVE 05/06/2009 1439   UROBILINOGEN 0.2 05/06/2009  1439   NITRITE NEGATIVE 05/06/2009 1439   LEUKOCYTESUR  05/06/2009 1439    NEGATIVE Biochemical Testing Only. Please order routine urinalysis from main lab if confirmatory testing is needed.    STUDIES: Since her last visit to the office, she underwent diagnostic bilateral mammography with tomography at the Breast center on 02/15/2017 that showed: Breast density category B. There was no mammographic evidence for malignancy.  ASSESSMENT: 42 y.o. BRCA negative [place] Wilson s/p Right breast biopsy lower inner quadrant 11/21/2014 for a clinical T2 NX, stage 2 invasive ductal carcinoma, grade 2 or 3, estrogen and progesterone receptor negative, HER-2 equivocal, with an Mib-1 of 20%  (a) biopsy of a suspicious axillary lymph node same day was negative, but discordant  (b) review of 80 additional tumor cells by FISH still showed HER-2 not amplified; tumor should be treated as a triple negative  (1) neoadjuvant chemotherapy started 01/01/2015 consisting of cyclophosphamide and doxorubicin in dose dense fashion 4, completed 02/13/2015, followed by paclitaxel/ carboplatin weekly 12 started 02/26/2015, completed 05/14/2015.   (2) genetics testing 12/15/2014 through the Breast/Ovarian gene panel offered by [facility] found no deleterious mutations in ATM, BARD1, BRCA1, BRCA2, BRIP1, CDH1, CHEK2, EPCAM, FANCC, MLH1, MSH2, MSH6, NBN, PALB2, PMS2, PTEN, RAD51C, RAD51D, TP53, and XRCC2  (3) right lumpectomy and sentinel lymph node sampling 06/15/2015 showed a residual pT1b pN0 invasive ductal carcinoma, grade 1, with negative margins. (There were actually a few microscopic nests of residual tumor and 0.9 is the longest distance between 2 of the microscopic nests--there was not enough tissue for repeat prognostic panel.)  (a) bilateral reduction mammoplasty 06/23/2015 showed no malignancy in either breast  (4) adjuvant radiation 09/01/2015-10/19/2015:  Right breast / 45 Gray @ 1.8 Pearline Cables per fraction x 25  fractions Right breast boost / 16 Gray at [employer] per fraction x 8 fractions    PLAN: Susan Wilson is now 2 years out from definitive surgery for her breast cancer with no evidence of.  This is particularly favorable because in triple negative breast cancers, if they are to recur, they tend to recur early.  She is interested in contraception.  We discussed  goserelin  She decided against it.  She would be interested in an IUD and will be discussing this with her gynecologist.  She is having more discomfort in the right arm and I am referring her back to physical therapy to see if that helps.  Otherwise she will return to see me in 1 year.  She knows to call for any other issues that may develop before that visit.   Symone Cornman, Virgie Dad, MD  07/03/17 11:34 AM Medical Oncology and Hematology [facility] [address] [place], [place] [address] Tel. [phone]    Fax. [phone]    This document serves as a record of services personally performed by Lurline Del, MD. It was created on his behalf by Steva Colder, a trained medical scribe. The creation of this record is based on the scribe's personal observations and the provider's statements to them.   I have reviewed the above documentation for accuracy and completeness, and I agree with the above.

## 2017-07-03 ENCOUNTER — Encounter: Payer: Self-pay | Admitting: Oncology

## 2017-07-03 ENCOUNTER — Inpatient Hospital Stay: Payer: PRIVATE HEALTH INSURANCE | Attending: Oncology | Admitting: Oncology

## 2017-07-03 VITALS — BP 127/88 | HR 79 | Temp 98.9°F | Resp 20 | Ht 65.0 in | Wt 224.3 lb

## 2017-07-03 DIAGNOSIS — Z853 Personal history of malignant neoplasm of breast: Secondary | ICD-10-CM | POA: Diagnosis present

## 2017-07-03 DIAGNOSIS — Z171 Estrogen receptor negative status [ER-]: Secondary | ICD-10-CM

## 2017-07-03 DIAGNOSIS — G620 Drug-induced polyneuropathy: Secondary | ICD-10-CM

## 2017-07-03 DIAGNOSIS — C50311 Malignant neoplasm of lower-inner quadrant of right female breast: Secondary | ICD-10-CM

## 2017-07-03 DIAGNOSIS — M79601 Pain in right arm: Secondary | ICD-10-CM | POA: Diagnosis not present

## 2017-07-03 DIAGNOSIS — T451X5A Adverse effect of antineoplastic and immunosuppressive drugs, initial encounter: Secondary | ICD-10-CM

## 2017-08-04 ENCOUNTER — Telehealth: Payer: Self-pay | Admitting: Adult Health

## 2017-08-04 NOTE — Telephone Encounter (Signed)
Patient called today stating that she still needs note for her employer regarding her long term disability faxed to [phone].  She reviewed this with Dr. Jana Hakim at her last appt with him.  At that appointment she was also supposed to have a referral to restart PT.  She has not yet received a call from PT and wanted to f/u on this as well.    Thanks,  Wilber Bihari, NP

## 2017-08-07 ENCOUNTER — Other Ambulatory Visit: Payer: Self-pay | Admitting: Oncology

## 2017-08-07 DIAGNOSIS — Z171 Estrogen receptor negative status [ER-]: Principal | ICD-10-CM

## 2017-08-07 DIAGNOSIS — C50311 Malignant neoplasm of lower-inner quadrant of right female breast: Secondary | ICD-10-CM

## 2017-08-07 NOTE — Progress Notes (Unsigned)
Referral to physical therapy placed 08/07/2017

## 2017-08-16 ENCOUNTER — Encounter: Payer: Self-pay | Admitting: Physical Therapy

## 2017-08-16 ENCOUNTER — Ambulatory Visit: Payer: 59 | Attending: Oncology | Admitting: Physical Therapy

## 2017-08-16 DIAGNOSIS — I890 Lymphedema, not elsewhere classified: Secondary | ICD-10-CM | POA: Insufficient documentation

## 2017-08-16 DIAGNOSIS — M6281 Muscle weakness (generalized): Secondary | ICD-10-CM | POA: Insufficient documentation

## 2017-08-16 DIAGNOSIS — M25611 Stiffness of right shoulder, not elsewhere classified: Secondary | ICD-10-CM

## 2017-08-16 DIAGNOSIS — R293 Abnormal posture: Secondary | ICD-10-CM | POA: Insufficient documentation

## 2017-08-16 DIAGNOSIS — M25511 Pain in right shoulder: Secondary | ICD-10-CM | POA: Insufficient documentation

## 2017-08-16 NOTE — Therapy (Signed)
[place], [place], [address] Phone: [phone]   Fax:  [phone]  Physical Therapy Evaluation  Patient Details  Name: Susan Wilson MRN: [id number] Date of Birth: 19-Aug-1975 Referring Provider: Dr Jana Hakim    Encounter Date: 08/16/2017  PT End of Session - 08/16/17 1233    Visit Number  1    Number of Visits  9    Date for PT Re-Evaluation  09/15/17    PT Start Time  7902    PT Stop Time  1100    PT Time Calculation (min)  45 min    Activity Tolerance  Patient tolerated treatment well    Behavior During Therapy  [facility] for tasks assessed/performed       Past Medical History:  Diagnosis Date  . Anemia   . Breast asymmetry 12/2015  . Breast cancer ([place]) 2017   right breast  . History of breast cancer 11/2014   right  . History of chemotherapy   . Migraines   . 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 Personal history of chemotherapy 2017  . Personal history of radiation therapy 2017    Past Surgical History:  Procedure Laterality Date  . BREAST LUMPECTOMY Right 2017  . BREAST RECONSTRUCTION Right 06/23/2015   Procedure: ONCOPLASTIC RIGHT BREAST REDUCTION;  Surgeon: Irene Limbo, MD;  Location: [place];  Service: Plastics;  Laterality: Right;  . BREAST REDUCTION SURGERY Left 06/23/2015   Procedure: MAMMARY REDUCTION  (BREAST) LEFT BREAST FOR SYMETRY (BILATERAL BREAST REDUCTION);  Surgeon: Irene Limbo, MD;  Location: [place];  Service: Plastics;  Laterality: Left;  . BREAST REDUCTION SURGERY Left 01/15/2016   Procedure: MAMMARY REDUCTION  (BREAST) LEFT;  Surgeon: Irene Limbo, MD;  Location: [place];  Service: Plastics;  Laterality: Left;  Marland Kitchen MASTOPEXY Left 01/15/2016   Procedure: POSSIBLE LEFT MASTOPEXY;  Surgeon: Irene Limbo, MD;  Location: [place];  Service: Plastics;   Laterality: Left;  . PORT-A-CATH REMOVAL Right 01/15/2016   Procedure: REMOVAL OF RIGHT PORT-A-CATH;  Surgeon: Irene Limbo, MD;  Location: [place];  Service: Plastics;  Laterality: Right;  . PORTACATH PLACEMENT Right 12/16/2014   Procedure: INSERTION PORT-A-CATH WITH ULTRASOUND;  Surgeon: Rolm Bookbinder, MD;  Location: [place];  Service: General;  Laterality: Right;  . RADIOACTIVE SEED GUIDED PARTIAL MASTECTOMY WITH AXILLARY SENTINEL LYMPH NODE BIOPSY Right 06/15/2015   Procedure: RADIOACTIVE SEED GUIDED PARTIAL MASTECTOMY WITH AXILLARY SENTINEL LYMPH NODE BIOPSY;  Surgeon: Rolm Bookbinder, MD;  Location: [place];  Service: General;  Laterality: Right;  . REDUCTION MAMMAPLASTY Bilateral 2017    There were no vitals filed for this visit.   Subjective Assessment - 08/16/17 1035    Subjective  She is stilll having pain and swelling her right arm despite day and night compression, exercise and kinesiotaping.  the more she does, the more she feels the fluid build in her upper shoulder and back. She says that her famiily has told her they can see and feel it around her shoulder blade  She also says she gets breast firmness and soreness despite wearing a compression bra     Pertinent History  Right lumpectomy with 6 nodes removed end of January 2017, and had breast reduction on both sides, she had another reduction on the left side in September of 2017  Had chemotherapy before surgery and completed radiation October 19, 2015.  Has been seen here before for lymphedema and shoulder ROM.  Has compression day ( and glove)  and nighttime sleeve.  She does some taping at home when her she has more swelling and spasms ,  Still has a pulley at home that she uses, and Theraband (yellow)  Portacath was removed and it's still tender there.  Now she just does follow-up visits, but no further treatment. No other health issues.  Has neuropathy in both hands and feet, with reported  numbness and tingling. Still has alot of numbness that is better in feet but sitll has problems with hands.     Patient Stated Goals  she would like to have less pain and help with swelling.      Currently in Pain?  Yes    Pain Score  4     Pain Location  Shoulder    Pain Orientation  Right    Pain Descriptors / Indicators  Aching    Pain Type  Chronic pain    Pain Radiating Towards  to back and shoulder blade     Pain Onset  More than a month ago    Pain Frequency  Intermittent    Aggravating Factors   cold or increased activity     Pain Relieving Factors  she has her mom help her tape it.          [facility] PT Assessment - 08/16/17 0001      Assessment   Medical Diagnosis  right breast cancer    Referring Provider  Dr Jana Hakim     Onset Date/Surgical Date  06/15/15    Hand Dominance  Right    Prior Therapy  here, last year, quite a few visits      Precautions   Precautions  Other (comment)    Precaution Comments  cancer, lymphedema precautions      Restrictions   Weight Bearing Restrictions  No      [place] residence    Living Arrangements  Spouse/significant other;Children ages 76 and 8    Type of [place]  One level      Prior Function   Level of Independence  Independent    Vocation  -- leave, plans to go back slowly soon     Vocation Requirements  plans to go back slowly needs to be able to typer     Leisure  shoudler pulleys, and shoulders and uses 2 or 3 pound weights, walks 2-3 times a week for about an hour at time       Cognition   Overall Cognitive Status  Within Functional Limits for tasks assessed      Observation/Other Assessments   Observations  possible swelling at anterior right upper chest and at right upper back; difficult to measure in these areas    Skin Integrity  not observed today, but patient denies any open wounds or scabs    Quick DASH   45.45      Observation/Other Assessments-Edema     Edema  -- visible edema in right UE, shoulder and back       Sensation   Light Touch  Not tested      Coordination   Gross Motor Movements are Fluid and Coordinated  Yes      Posture/Postural Control   Posture/Postural Control  Postural limitations    Postural Limitations  Rounded Shoulders;Forward head  AROM   Right Shoulder Extension  25 Degrees    Right Shoulder Flexion  145 Degrees    Right Shoulder ABduction  175 Degrees    Right Shoulder Internal Rotation  70 Degrees    Right Shoulder External Rotation  75 Degrees    Left Shoulder Extension  40 Degrees    Left Shoulder Flexion  160 Degrees    Left Shoulder ABduction  175 Degrees    Left Shoulder Internal Rotation  --    Left Shoulder External Rotation  --      PROM   Right Shoulder Flexion  --      Strength   Overall Strength  -- limited in right UE by pain and swelling       Palpation   Palpation comment  tightness in right scapular area with tenderness to deep palpation at posterior axillary area and pec major        LYMPHEDEMA/ONCOLOGY QUESTIONNAIRE - 08/16/17 1055      Right Upper Extremity Lymphedema   15 cm Proximal to Olecranon Process  40 cm    10 cm Proximal to Olecranon Process  39 cm    Olecranon Process  29 cm    15 cm Proximal to Ulnar Styloid Process  30.5 cm    10 cm Proximal to Ulnar Styloid Process  28 cm    Just Proximal to Ulnar Styloid Process  18.5 cm    Across Hand at [employer]  21 cm    At [place] of 2nd Digit  6.5 cm      Left Upper Extremity Lymphedema   15 cm Proximal to Olecranon Process  40.5 cm    10 cm Proximal to Olecranon Process  39 cm    Olecranon Process  30 cm    15 cm Proximal to Ulnar Styloid Process  29 cm    10 cm Proximal to Ulnar Styloid Process  26 cm    Just Proximal to Ulnar Styloid Process  18 cm    Across Hand at [employer]  19.1 cm    At [place] of 2nd Digit  6.2 cm          Quick Dash - 08/16/17 0001    Open a tight or new jar  Mild  difficulty    Do heavy household chores (wash walls, wash floors)  Moderate difficulty    Carry a shopping bag or briefcase  Moderate difficulty    Wash your back  Moderate difficulty    Use a knife to cut food  Mild difficulty    Recreational activities in which you take some force or impact through your arm, shoulder, or hand (golf, hammering, tennis)  Moderate difficulty    During the past week, to what extent has your arm, shoulder or hand problem interfered with your normal social activities with family, friends, neighbors, or groups?  Modererately    During the past week, to what extent has your arm, shoulder or hand problem limited your work or other regular daily activities  Modererately    Arm, shoulder, or hand pain.  Moderate    Tingling (pins and needles) in your arm, shoulder, or hand  Moderate    Difficulty Sleeping  Moderate difficulty    DASH Score  45.45 %        Objective measurements completed on examination: See above findings.                   PT  Long Term Goals - 08/16/17 1240      PT LONG TERM GOAL #1   Title  Pt will report the pain and discomfort in her right upper quadant is decreased by 50%    Time  4    Period  Weeks    Status  New      PT LONG TERM GOAL #2   Title  Pt will report she will be able to use the compression pump to help with her symptoms of lymphedema in her right upper quadrant     Time  4    Period  Weeks    Status  New      PT LONG TERM GOAL #3   Title  Pt will increase the range of motin of right shoulder extension to to 40 degrees     Time  4    Period  Weeks    Status  New      PT LONG TERM GOAL #4   Title  Pt will be independent in a strengthening program to left shoulder     Time  4    Period  Weeks    Status  New      PT LONG TERM GOAL #5   Title  Pt will have correctly fitting sleeve and glove to continue to manage her lymphedema at home     Time  [facility]  Goals - 12/03/14 1519      Patient will be able to verbalize understanding of pertinent lymphedema risk reduction practices relevant to her diagnosis specifically related to skin care.   Time  1    Period  Days    Status  Achieved      Patient will be able to return demonstrate and/or verbalize understanding of the post-op home exercise program related to regaining shoulder range of motion.   Time  1    Period  Days    Status  Achieved      Patient will be able to verbalize understanding of the importance of attending the postoperative After Breast Cancer Class for further lymphedema risk reduction education and therapeutic exercise.   Time  1    Period  Days    Status  Achieved       Long Term Clinic Goals - 06/28/16 1044      CC Long Term Goal  #1   Title  Right shoulder active flexion to at least 145 degrees for improved overhead reach.    Baseline  145 degrees-06/21/16    Time  4    Period  Weeks    Status  Achieved      CC Long Term Goal  #2   Title  Right shoulder external rotation to 70 degrees. to allow for better shoulder abduction     Baseline  50 degrees-06/21/16, 06/28/16- 70 degrees    Time  4    Period  Weeks    Status  Achieved      CC Long Term Goal  #3   Title  Right shoulder abduction to at least 150 degrees for improved ADLs.    Baseline  151 degrees-06/21/16    Time  4    Period  Weeks    Status  Achieved      CC Long Term Goal  #4   Title  Patient will know how and where to obtain compression  garments for right UE.    Baseline  Pt will be measured by [facility] at her home on 06/27/16    Time  6    Period  Weeks    Status  Achieved      CC Long Term Goal  #5   Title  Independent in HEP for right shoulder ROM and strengthening.    Baseline  Pt is now independent with HEP for ROM-06/21/16, 06/28/16- pt independent in strength after breast cancer program    Time  4    Period  Weeks    Status  Achieved          Plan - 08/16/17 1233    Clinical  Impression Statement  Pt returns with persistent pain and swelling in right arm, axilla, breast, scapular area back and shoulder despite weight loss, exercise and use of compression . Her circumferential measurements are up from last session and she still feels pain and weakness in her shoulder with decreased end ranges of flexion and extension. She has tender trigger points in some muscles.  Feel she will benefit from a compression pump with a trunk component to help with these symptoms     History and Personal Factors relevant to plan of care:  previous radiation, chemo and surgery for breast cancer, previous PT episodes , chemotherapy induced peripheral neuropathy     Clinical Presentation  Evolving    Clinical Presentation due to:  increasing lymphedema in right arm     Clinical Decision Making  Moderate    Rehab Potential  Good    Clinical Impairments Affecting Rehab Potential  as above     PT Frequency  2x / week    PT Duration  4 weeks    PT Treatment/Interventions  ADLs/Self Care Home Management;Therapeutic activities;Therapeutic exercise;Patient/family education;Manual techniques;Manual lymph drainage;Scar mobilization;Passive range of motion;Taping;DME Instruction;Orthotic Fit/Training;Compression bandaging    PT Next Visit Plan  check to see if pt brings in her current sleeve and glove see if there is imformation about the compression pump, joint mobilizaion to increase shoulder extension, manual techniques to tight muscles of right scapular area, strengthening to rith UE     Consulted and Agree with Plan of Care  Patient       Patient will benefit from skilled therapeutic intervention in order to improve the following deficits and impairments:  Decreased range of motion, Decreased strength, Increased edema, Impaired UE functional use, Pain, Increased fascial restricitons, Postural dysfunction, Increased muscle spasms  Visit Diagnosis: Lymphedema, not elsewhere classified  Stiffness of  right shoulder, not elsewhere classified  Right shoulder pain, unspecified chronicity  Abnormal posture  Muscle weakness (generalized)     Problem List Patient Active Problem List   Diagnosis Date Noted  . Port catheter in place 12/28/2015  . Chemotherapy induced neutropenia ([place]) 05/14/2015  . 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 Tooth ache 01/09/2015  . Genetic testing 12/26/2014  . Family history of breast cancer   . Malignant neoplasm of lower-inner quadrant of right breast of female, estrogen receptor negative ([place]) 11/25/2014  . Migraine 11/19/2014   Donato Heinz. Owens Shark PT  Norwood Levo 08/16/2017, 12:45 PM  [place], [place], [address] Phone: [phone]   Fax:  [phone]  Name: Susan Wilson MRN: [id number] Date of Birth: 1976/03/24

## 2017-08-18 ENCOUNTER — Ambulatory Visit: Payer: 59 | Admitting: Physical Therapy

## 2017-08-18 DIAGNOSIS — M25611 Stiffness of right shoulder, not elsewhere classified: Secondary | ICD-10-CM

## 2017-08-18 DIAGNOSIS — R293 Abnormal posture: Secondary | ICD-10-CM

## 2017-08-18 DIAGNOSIS — M25511 Pain in right shoulder: Secondary | ICD-10-CM

## 2017-08-18 DIAGNOSIS — I890 Lymphedema, not elsewhere classified: Secondary | ICD-10-CM

## 2017-08-18 DIAGNOSIS — M6281 Muscle weakness (generalized): Secondary | ICD-10-CM

## 2017-08-18 NOTE — Therapy (Signed)
[place], [place], [address] Phone: [phone]   Fax:  [phone]  Physical Therapy Treatment  Patient Details  Name: Susan Wilson MRN: [id number] Date of Birth: January 19, 1976 Referring Provider: Dr Jana Hakim    Encounter Date: 08/18/2017  PT End of Session - 08/18/17 1013    Visit Number  2    Number of Visits  9    Date for PT Re-Evaluation  09/15/17    PT Start Time  0845    PT Stop Time  0930    PT Time Calculation (min)  45 min    Behavior During Therapy  [facility] for tasks assessed/performed       Past Medical History:  Diagnosis Date  . Anemia   . Breast asymmetry 12/2015  . Breast cancer ([place]) 2017   right breast  . History of breast cancer 11/2014   right  . History of chemotherapy   . Migraines   . 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 Personal history of chemotherapy 2017  . Personal history of radiation therapy 2017    Past Surgical History:  Procedure Laterality Date  . BREAST LUMPECTOMY Right 2017  . BREAST RECONSTRUCTION Right 06/23/2015   Procedure: ONCOPLASTIC RIGHT BREAST REDUCTION;  Surgeon: Irene Limbo, MD;  Location: [facility];  Service: Plastics;  Laterality: Right;  . BREAST REDUCTION SURGERY Left 06/23/2015   Procedure: MAMMARY REDUCTION  (BREAST) LEFT BREAST FOR SYMETRY (BILATERAL BREAST REDUCTION);  Surgeon: Irene Limbo, MD;  Location: [facility];  Service: Plastics;  Laterality: Left;  . BREAST REDUCTION SURGERY Left 01/15/2016   Procedure: MAMMARY REDUCTION  (BREAST) LEFT;  Surgeon: Irene Limbo, MD;  Location: [facility];  Service: Plastics;  Laterality: Left;  Marland Kitchen MASTOPEXY Left 01/15/2016   Procedure: POSSIBLE LEFT MASTOPEXY;  Surgeon: Irene Limbo, MD;  Location: [facility];  Service: Plastics;  Laterality: Left;  . PORT-A-CATH REMOVAL Right 01/15/2016    Procedure: REMOVAL OF RIGHT PORT-A-CATH;  Surgeon: Irene Limbo, MD;  Location: [facility];  Service: Plastics;  Laterality: Right;  . PORTACATH PLACEMENT Right 12/16/2014   Procedure: INSERTION PORT-A-CATH WITH ULTRASOUND;  Surgeon: Rolm Bookbinder, MD;  Location: [facility];  Service: General;  Laterality: Right;  . RADIOACTIVE SEED GUIDED PARTIAL MASTECTOMY WITH AXILLARY SENTINEL LYMPH NODE BIOPSY Right 06/15/2015   Procedure: RADIOACTIVE SEED GUIDED PARTIAL MASTECTOMY WITH AXILLARY SENTINEL LYMPH NODE BIOPSY;  Surgeon: Rolm Bookbinder, MD;  Location: [facility];  Service: General;  Laterality: Right;  . REDUCTION MAMMAPLASTY Bilateral 2017    There were no vitals filed for this visit.  Subjective Assessment - 08/18/17 0905    Subjective  She brings in her sleeve today     Pertinent History  Right lumpectomy with 6 nodes removed end of January 2017, and had breast reduction on both sides, she had another reduction on the left side in September of 2017  Had chemotherapy before surgery and completed radiation October 19, 2015.  Has been seen here before for lymphedema and shoulder ROM.  Has compression day ( and glove)  and nighttime sleeve.  She does some taping at home when her she has more swelling and spasms ,  Still has a pulley at home that she uses, and Theraband (yellow)  Portacath was removed and it's still tender there.  Now she just does follow-up visits, but no further treatment. No other  health issues.  Has neuropathy in both hands and feet, with reported numbness and tingling. Still has alot of numbness that is better in feet but sitll has problems with hands.     Patient Stated Goals  she would like to have less pain and help with swelling.      Currently in Pain?  Yes    Pain Score  3     Pain Location  Shoulder    Pain Orientation  Right    Pain Descriptors / Indicators  Aching    Pain Type  Chronic pain    Pain Radiating Towards  shoulder and top of  arm     Pain Onset  More than a month ago    Pain Frequency  Intermittent    Aggravating Factors   rainy weather today     Pain Relieving Factors  kinesiotape helps                        [facility] Adult PT Treatment/Exercise - 08/18/17 0001      Self-Care   Self-Care  Other Self-Care Comments    Other Self-Care Comments   discussed compression pump options and pt will contact sales rep of lymphapress rep for more information  Reviewed compresison sleeve and glove.  Pt has an Elvarex Class 2 sleeve and glove that appears to fit well Insturcted in donning with [employer] x2 and pt was able to do it herself and gave her information about how to get one.       Manual Therapy   Manual Therapy  Joint mobilization;Myofascial release;Manual Lymphatic Drainage (MLD);Passive ROM;Taping    Joint Mobilization  joint distraction and grad one posterior and inferior glides     Manual Lymphatic Drainage (MLD)  briefly, short neck, superficial and deep abdominals, right shoulder circles, right upper arm, forearm and hand with return along pathways     Passive ROM  to limits flexion, abduction and diagonal elevation     Kinesiotex  --      Kinesiotix   Edema  skinkote applied across back and kinesiotape in fan shape with 4 prongs and bucket at left posterior shoulder                   PT Long Term Goals - 08/16/17 1240      PT LONG TERM GOAL #1   Title  Pt will report the pain and discomfort in her right upper quadant is decreased by 50%    Time  4    Period  Weeks    Status  New      PT LONG TERM GOAL #2   Title  Pt will report she will be able to use the compression pump to help with her symptoms of lymphedema in her right upper quadrant     Time  4    Period  Weeks    Status  New      PT LONG TERM GOAL #3   Title  Pt will increase the range of motin of right shoulder extension to to 40 degrees     Time  4    Period  Weeks    Status  New      PT LONG TERM GOAL #4    Title  Pt will be independent in a strengthening program to left shoulder     Time  4    Period  Weeks    Status  New  PT LONG TERM GOAL #5   Title  Pt will have correctly fitting sleeve and glove to continue to manage her lymphedema at home     Time  4    Period  Weeks    Status  New            Plan - 08/18/17 1013    Clinical Impression Statement  Pt continues with upper quadrant congestion.  She has not met out of pocket limits so there would be a large financial burden for Lymph[employer] at this point, but pt was given information and will contact sales rep about options.  Her sleeve and glove seem to fit well and pt instructed in use of Medi butler to help with donning. Tried new configuration of kinesiotape to see if she will get benefit     Rehab Potential  Good    PT Treatment/Interventions  ADLs/Self Care Home Management;Therapeutic activities;Therapeutic exercise;Patient/family education;Manual techniques;Manual lymph drainage;Scar mobilization;Passive range of motion;Taping;DME Instruction;Orthotic Fit/Training;Compression bandaging    PT Next Visit Plan  Periodically check in on progress with compression pump and facilitate getting more compression garments if needed. cont with  joint mobilizaion to increase shoulder extension, manual techniques to tight muscles of right scapular area, strengthening to rith UE     Consulted and Agree with Plan of Care  Patient       Patient will benefit from skilled therapeutic intervention in order to improve the following deficits and impairments:  Decreased range of motion, Decreased strength, Increased edema, Impaired UE functional use, Pain, Increased fascial restricitons, Postural dysfunction, Increased muscle spasms  Visit Diagnosis: Lymphedema, not elsewhere classified  Stiffness of right shoulder, not elsewhere classified  Right shoulder pain, unspecified chronicity  Abnormal posture  Muscle weakness  (generalized)     Problem List Patient Active Problem List   Diagnosis Date Noted  . Port catheter in place 12/28/2015  . Chemotherapy induced neutropenia ([place]) 05/14/2015  . 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 Tooth ache 01/09/2015  . Genetic testing 12/26/2014  . Family history of breast cancer   . Malignant neoplasm of lower-inner quadrant of right breast of female, estrogen receptor negative ([place]) 11/25/2014  . Migraine 11/19/2014   Donato Heinz. Owens Shark PT  Norwood Levo 08/18/2017, 10:18 AM  [place], [place], [address] Phone: [phone]   Fax:  [phone]  Name: Susan Wilson MRN: [id number] Date of Birth: 1975/07/01

## 2017-08-18 NOTE — Patient Instructions (Signed)
First of all, check with your insurance company to see if provider is in [place]   (for wigs and compression sleeves / gloves/gauntlets )  [place], [address] [address] [phone]  Will file some insurances --- call for appointment   Second to [facility] (for mastectomy prosthetics and garments) [place], [place] [address] [phone] Will file some insurances --- call for appointment  [facility]  [address]  [place], [place] [address] [phone] Lower extremity garments  [facility] [address] [place], [place]  [address] [place] Sales rep:  [place]:  [phone] [email or web address] Biocompression pumps   [employer]  Sales rep: Donneta Romberg:  [phone] [email or web address] [place] Pulley and Flexitouch pumps    Other Resources: National Lymphedema Network:  www.lymphnet.org [email or web address] for patient articles and purchase a self manual lymph drainage DVD [email or web address] has informative articles.

## 2017-08-23 ENCOUNTER — Encounter: Payer: Self-pay | Admitting: Physical Therapy

## 2017-08-23 ENCOUNTER — Ambulatory Visit: Payer: 59 | Admitting: Physical Therapy

## 2017-08-23 DIAGNOSIS — I890 Lymphedema, not elsewhere classified: Secondary | ICD-10-CM

## 2017-08-23 DIAGNOSIS — M25611 Stiffness of right shoulder, not elsewhere classified: Secondary | ICD-10-CM

## 2017-08-23 DIAGNOSIS — M6281 Muscle weakness (generalized): Secondary | ICD-10-CM

## 2017-08-23 DIAGNOSIS — R293 Abnormal posture: Secondary | ICD-10-CM

## 2017-08-23 DIAGNOSIS — M25511 Pain in right shoulder: Secondary | ICD-10-CM

## 2017-08-23 NOTE — Therapy (Signed)
[place], [place], [address] Phone: [phone]   Fax:  [phone]  Physical Therapy Treatment  Patient Details  Name: Susan Wilson MRN: [id number] Date of Birth: 10/13/1975 Referring Provider: Dr Jana Hakim    Encounter Date: 08/23/2017  PT End of Session - 08/23/17 1239    Visit Number  3    Number of Visits  9    Date for PT Re-Evaluation  09/15/17    PT Start Time  0845    PT Stop Time  0930    PT Time Calculation (min)  45 min    Activity Tolerance  Patient tolerated treatment well    Behavior During Therapy  [facility] for tasks assessed/performed       Past Medical History:  Diagnosis Date  . Anemia   . Breast asymmetry 12/2015  . Breast cancer ([place]) 2017   right breast  . History of breast cancer 11/2014   right  . History of chemotherapy   . Migraines   . 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 Personal history of chemotherapy 2017  . Personal history of radiation therapy 2017    Past Surgical History:  Procedure Laterality Date  . BREAST LUMPECTOMY Right 2017  . BREAST RECONSTRUCTION Right 06/23/2015   Procedure: ONCOPLASTIC RIGHT BREAST REDUCTION;  Surgeon: Irene Limbo, MD;  Location: [place];  Service: Plastics;  Laterality: Right;  . BREAST REDUCTION SURGERY Left 06/23/2015   Procedure: MAMMARY REDUCTION  (BREAST) LEFT BREAST FOR SYMETRY (BILATERAL BREAST REDUCTION);  Surgeon: Irene Limbo, MD;  Location: [place];  Service: Plastics;  Laterality: Left;  . BREAST REDUCTION SURGERY Left 01/15/2016   Procedure: MAMMARY REDUCTION  (BREAST) LEFT;  Surgeon: Irene Limbo, MD;  Location: [place];  Service: Plastics;  Laterality: Left;  Marland Kitchen MASTOPEXY Left 01/15/2016   Procedure: POSSIBLE LEFT MASTOPEXY;  Surgeon: Irene Limbo, MD;  Location: [place];  Service: Plastics;   Laterality: Left;  . PORT-A-CATH REMOVAL Right 01/15/2016   Procedure: REMOVAL OF RIGHT PORT-A-CATH;  Surgeon: Irene Limbo, MD;  Location: [place];  Service: Plastics;  Laterality: Right;  . PORTACATH PLACEMENT Right 12/16/2014   Procedure: INSERTION PORT-A-CATH WITH ULTRASOUND;  Surgeon: Rolm Bookbinder, MD;  Location: [place];  Service: General;  Laterality: Right;  . RADIOACTIVE SEED GUIDED PARTIAL MASTECTOMY WITH AXILLARY SENTINEL LYMPH NODE BIOPSY Right 06/15/2015   Procedure: RADIOACTIVE SEED GUIDED PARTIAL MASTECTOMY WITH AXILLARY SENTINEL LYMPH NODE BIOPSY;  Surgeon: Rolm Bookbinder, MD;  Location: [place];  Service: General;  Laterality: Right;  . REDUCTION MAMMAPLASTY Bilateral 2017    There were no vitals filed for this visit.  Subjective Assessment - 08/23/17 0851    Subjective  Pt is feeling good today.  "Its not achy because its warm out"  She is working on submitting all her information to Medicaid and hopes to find out more about the pump.  Incidentally pt states she is hoping to get back to motorcycle.  She rides her own bike .     Pertinent History  Right lumpectomy with 6 nodes removed end of January 2017, and had breast reduction on both sides, she had another reduction on the left side in September of 2017  Had chemotherapy before surgery and completed radiation October 19, 2015.  Has been seen here before for lymphedema and shoulder ROM.  Has compression day ( and  glove)  and nighttime sleeve.  She does some taping at home when her she has more swelling and spasms ,  Still has a pulley at home that she uses, and Theraband (yellow)  Portacath was removed and it's still tender there.  Now she just does follow-up visits, but no further treatment. No other health issues.  Has neuropathy in both hands and feet, with reported numbness and tingling. Still has alot of numbness that is better in feet but sitll has problems with hands.     Patient Stated  Goals  she would like to have less pain and help with swelling.  08/23/2017  pt wants to get back to riding her motorcycle.     Currently in Pain?  Yes    Pain Score  2     Pain Location  Axilla    Pain Orientation  Right    Pain Descriptors / Indicators  Aching;Tingling    Pain Type  Chronic pain    Pain Radiating Towards  shoulder and top of arm     Pain Onset  More than a month ago    Pain Frequency  Intermittent                       [facility] Adult PT Treatment/Exercise - 08/23/17 0001      Exercises   Exercises  Lumbar      Lumbar Exercises: Aerobic   Tread Mill  10 minutes at 2.0 mph. 0% grade with RPE at 3/10       Lumbar Exercises: Standing   Forward Lunge  Other (comment)    Forward Lunge Limitations  walking lunges x 40 feet     Side Lunge  Other (comment)    Side Lunge Limitations  side step and squat and feet together x 40 feet       Lumbar Exercises: Seated   Hip Flexion on Ball  AROM;Right;Left;10 reps    Other Seated Lumbar Exercises  on exercise ball for pelvic tilts forward, back and lateral as well as circular "hula hips"      Other Seated Lumbar Exercises  on ball with verbal and visual cues for lateral excursion and reaching out with opposite arm wtih 2# weight in arm x 10 reps  on ball for bicep curls with 2# in each hand x 10 reps       Shoulder Exercises: Standing   Other Standing Exercises  scapular protraction with verbal and visual cues to perform activity .  She was not able to do it bilaterally but was able to get good scapular excursion with one arm at a time     Other Standing Exercises  standing dowel rod flexion with cues to keep abdominal engaged with scapula down and back and full shoulder stretch with slow even tempo, also with abduction and  shoulder extension x 5 reps with deep breathing                   PT Long Term Goals - 08/16/17 1240      PT LONG TERM GOAL #1   Title  Pt will report the pain and discomfort in  her right upper quadant is decreased by 50%    Time  4    Period  Weeks    Status  New      PT LONG TERM GOAL #2   Title  Pt will report she will be able to use the compression pump to  help with her symptoms of lymphedema in her right upper quadrant     Time  4    Period  Weeks    Status  New      PT LONG TERM GOAL #3   Title  Pt will increase the range of motin of right shoulder extension to to 40 degrees     Time  4    Period  Weeks    Status  New      PT LONG TERM GOAL #4   Title  Pt will be independent in a strengthening program to left shoulder     Time  4    Period  Weeks    Status  New      PT LONG TERM GOAL #5   Title  Pt will have correctly fitting sleeve and glove to continue to manage her lymphedema at home     Time  4    Period  Weeks    Status  New            Plan - 08/23/17 1239    Clinical Impression Statement  Pt did a great job with exercise today.  Worked on core stregthening and response to pelvic respositioning as she wants to get back to riding her motorcycle. She only needed one rest break.  She had some difficulty with isolating movment of right scapular protraction and reatraction so will work on that more next sesssion     PT Frequency  2x / week    PT Duration  4 weeks    PT Treatment/Interventions  ADLs/Self Care Home Management;Therapeutic activities;Therapeutic exercise;Patient/family education;Manual techniques;Manual lymph drainage;Scar mobilization;Passive range of motion;Taping;DME Instruction;Orthotic Fit/Training;Compression bandaging    PT Next Visit Plan  Periodically check in on progress with compression pump and facilitate getting more compression garments if needed. cont with  joint mobilizaion to increase shoulder extension, manual techniques to tight muscles of right scapular area, strengthening to right UE and core, especially with pelvic movement     Consulted and Agree with Plan of Care  Patient       Patient will benefit from  skilled therapeutic intervention in order to improve the following deficits and impairments:  Decreased range of motion, Decreased strength, Increased edema, Impaired UE functional use, Pain, Increased fascial restricitons, Postural dysfunction, Increased muscle spasms  Visit Diagnosis: Lymphedema, not elsewhere classified  Stiffness of right shoulder, not elsewhere classified  Right shoulder pain, unspecified chronicity  Abnormal posture  Muscle weakness (generalized)     Problem List Patient Active Problem List   Diagnosis Date Noted  . Port catheter in place 12/28/2015  . Chemotherapy induced neutropenia ([place]) 05/14/2015  . 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 Tooth ache 01/09/2015  . Genetic testing 12/26/2014  . Family history of breast cancer   . Malignant neoplasm of lower-inner quadrant of right breast of female, estrogen receptor negative ([place]) 11/25/2014  . Migraine 11/19/2014   Donato Heinz. Owens Shark PT  Norwood Levo 08/23/2017, 12:44 PM  [place], [place], [address] Phone: [phone]   Fax:  [phone]  Name: GAYLYN BERISH MRN: [id number] Date of Birth: 02/26/1976

## 2017-08-24 ENCOUNTER — Ambulatory Visit: Payer: 59

## 2017-08-24 DIAGNOSIS — M25511 Pain in right shoulder: Secondary | ICD-10-CM

## 2017-08-24 DIAGNOSIS — M25611 Stiffness of right shoulder, not elsewhere classified: Secondary | ICD-10-CM

## 2017-08-24 DIAGNOSIS — I890 Lymphedema, not elsewhere classified: Secondary | ICD-10-CM

## 2017-08-24 DIAGNOSIS — R293 Abnormal posture: Secondary | ICD-10-CM

## 2017-08-24 DIAGNOSIS — M6281 Muscle weakness (generalized): Secondary | ICD-10-CM

## 2017-08-24 NOTE — Therapy (Signed)
[place], [place], [address] Phone: [phone]   Fax:  [phone]  Physical Therapy Treatment  Patient Details  Name: Susan Wilson MRN: [id number] Date of Birth: [birth year] Referring Provider: Dr Jana Hakim    Encounter Date: 08/24/2017  PT End of Session - 08/24/17 0858    Visit Number  4    Number of Visits  9    Date for PT Re-Evaluation  09/15/17    PT Start Time  0847    PT Stop Time  0930    PT Time Calculation (min)  43 min    Activity Tolerance  Patient tolerated treatment well    Behavior During Therapy  [facility] for tasks assessed/performed       Past Medical History:  Diagnosis Date  . Anemia   . Breast asymmetry 12/2015  . Breast cancer ([place]) 2017   right breast  . History of breast cancer 11/2014   right  . History of chemotherapy   . Migraines   . 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 Personal history of chemotherapy 2017  . Personal history of radiation therapy 2017    Past Surgical History:  Procedure Laterality Date  . BREAST LUMPECTOMY Right 2017  . BREAST RECONSTRUCTION Right 06/23/2015   Procedure: ONCOPLASTIC RIGHT BREAST REDUCTION;  Surgeon: Irene Limbo, MD;  Location: [place];  Service: Plastics;  Laterality: Right;  . BREAST REDUCTION SURGERY Left 06/23/2015   Procedure: MAMMARY REDUCTION  (BREAST) LEFT BREAST FOR SYMETRY (BILATERAL BREAST REDUCTION);  Surgeon: Irene Limbo, MD;  Location: [place];  Service: Plastics;  Laterality: Left;  . BREAST REDUCTION SURGERY Left 01/15/2016   Procedure: MAMMARY REDUCTION  (BREAST) LEFT;  Surgeon: Irene Limbo, MD;  Location: [place];  Service: Plastics;  Laterality: Left;  Marland Kitchen MASTOPEXY Left 01/15/2016   Procedure: POSSIBLE LEFT MASTOPEXY;  Surgeon: Irene Limbo, MD;  Location: [place];  Service: Plastics;   Laterality: Left;  . PORT-A-CATH REMOVAL Right 01/15/2016   Procedure: REMOVAL OF RIGHT PORT-A-CATH;  Surgeon: Irene Limbo, MD;  Location: [place];  Service: Plastics;  Laterality: Right;  . PORTACATH PLACEMENT Right 12/16/2014   Procedure: INSERTION PORT-A-CATH WITH ULTRASOUND;  Surgeon: Rolm Bookbinder, MD;  Location: [place];  Service: General;  Laterality: Right;  . RADIOACTIVE SEED GUIDED PARTIAL MASTECTOMY WITH AXILLARY SENTINEL LYMPH NODE BIOPSY Right 06/15/2015   Procedure: RADIOACTIVE SEED GUIDED PARTIAL MASTECTOMY WITH AXILLARY SENTINEL LYMPH NODE BIOPSY;  Surgeon: Rolm Bookbinder, MD;  Location: [place];  Service: General;  Laterality: Right;  . REDUCTION MAMMAPLASTY Bilateral 2017    There were no vitals filed for this visit.  Subjective Assessment - 08/24/17 0851    Subjective  My Rt shoulder is pretty sore from yesterday but other than that I'm feeling good.     Pertinent History  Right lumpectomy with 6 nodes removed end of January 2017, and had breast reduction on both sides, she had another reduction on the left side in September of 2017  Had chemotherapy before surgery and completed radiation October 19, 2015.  Has been seen here before for lymphedema and shoulder ROM.  Has compression day ( and glove)  and nighttime sleeve.  She does some taping at home when her she has more swelling and spasms ,  Still has a pulley at home that she uses, and Theraband (yellow)  Portacath was  removed and it's still tender there.  Now she just does follow-up visits, but no further treatment. No other health issues.  Has neuropathy in both hands and feet, with reported numbness and tingling. Still has alot of numbness that is better in feet but sitll has problems with hands.     Patient Stated Goals  she would like to have less pain and help with swelling.  08/23/2017  pt wants to get back to riding her motorcycle.     Currently in Pain?  Yes    Pain Score  4      Pain Location  Axilla    Pain Orientation  Right    Pain Descriptors / Indicators  Aching;Pressure    Pain Type  Chronic pain    Pain Radiating Towards  shoulder and into breast    Pain Onset  More than a month ago    Pain Frequency  Intermittent    Aggravating Factors   increased activity with exercises yesterday, but pt reports she expected this     Pain Relieving Factors  kinesiotape helps                       [facility] Adult PT Treatment/Exercise - 08/24/17 0001      Lumbar Exercises: Aerobic   Tread Mill  10 minutes at 2.0 mph. 0% grade with RPE at 3/10       Lumbar Exercises: Seated   Hip Flexion on Ball  AROM;Right;Left;10 reps    Other Seated Lumbar Exercises  on exercise ball for pelvic tilts forward, back and lateral as well as bil circles    Other Seated Lumbar Exercises  on ball with verbal and visual cues for lateral excursion and reaching out with opposite arm wtih 2# weight in arm x 10 reps  on ball for bicep curls with 2# in each hand 2 x 10 reps each      Manual Therapy   Manual Therapy  Joint mobilization;Manual Lymphatic Drainage (MLD);Passive ROM    Joint Mobilization  joint distraction and grad one posterior and inferior glides     Manual Lymphatic Drainage (MLD)  --    Passive ROM  to limits of flexion, abduction and diagonal elevation                   PT Long Term Goals - 08/16/17 1240      PT LONG TERM GOAL #1   Title  Pt will report the pain and discomfort in her right upper quadant is decreased by 50%    Time  4    Period  Weeks    Status  New      PT LONG TERM GOAL #2   Title  Pt will report she will be able to use the compression pump to help with her symptoms of lymphedema in her right upper quadrant     Time  4    Period  Weeks    Status  New      PT LONG TERM GOAL #3   Title  Pt will increase the range of motin of right shoulder extension to to 40 degrees     Time  4    Period  Weeks    Status  New      PT LONG  TERM GOAL #4   Title  Pt will be independent in a strengthening program to left shoulder     Time  4  Period  Weeks    Status  New      PT LONG TERM GOAL #5   Title  Pt will have correctly fitting sleeve and glove to continue to manage her lymphedema at home     Time  4    Period  Weeks    Status  New            Plan - 08/24/17 9753    Clinical Impression Statement  Continued with cardio and core strengthening exercisees today, but also included manual therapy to Rt shoulder as pt reports it feeling very sore after yesterdays session. Pt reports her Rt axilla and chest tightness much improved by end of session.     Rehab Potential  Good    Clinical Impairments Affecting Rehab Potential  as above     PT Frequency  2x / week    PT Duration  4 weeks    PT Treatment/Interventions  ADLs/Self Care Home Management;Therapeutic activities;Therapeutic exercise;Patient/family education;Manual techniques;Manual lymph drainage;Scar mobilization;Passive range of motion;Taping;DME Instruction;Orthotic Fit/Training;Compression bandaging    PT Next Visit Plan  Periodically check in on progress with compression pump and facilitate getting more compression garments if needed. cont with  joint mobilizaion to increase shoulder extension, manual techniques to tight muscles of right scapular area, strengthening to right UE and core, especially with pelvic movement     Consulted and Agree with Plan of Care  Patient       Patient will benefit from skilled therapeutic intervention in order to improve the following deficits and impairments:  Decreased range of motion, Decreased strength, Increased edema, Impaired UE functional use, Pain, Increased fascial restricitons, Postural dysfunction, Increased muscle spasms  Visit Diagnosis: Lymphedema, not elsewhere classified  Stiffness of right shoulder, not elsewhere classified  Right shoulder pain, unspecified chronicity  Abnormal posture  Muscle  weakness (generalized)     Problem List Patient Active Problem List   Diagnosis Date Noted  . Port catheter in place 12/28/2015  . Chemotherapy induced neutropenia ([place]) 05/14/2015  . 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 Tooth ache 01/09/2015  . Genetic testing 12/26/2014  . Family history of breast cancer   . Malignant neoplasm of lower-inner quadrant of right breast of female, estrogen receptor negative ([place]) 11/25/2014  . Migraine 11/19/2014    Otelia Limes, PTA 08/24/2017, 9:31 AM  [place], [place], [address] Phone: [phone]   Fax:  [phone]  Name: METHA KOLASA MRN: [id number] Date of Birth: [birth year]

## 2017-08-29 ENCOUNTER — Ambulatory Visit: Payer: 59

## 2017-08-29 DIAGNOSIS — I890 Lymphedema, not elsewhere classified: Secondary | ICD-10-CM

## 2017-08-29 DIAGNOSIS — M6281 Muscle weakness (generalized): Secondary | ICD-10-CM

## 2017-08-29 DIAGNOSIS — R293 Abnormal posture: Secondary | ICD-10-CM

## 2017-08-29 DIAGNOSIS — M25511 Pain in right shoulder: Secondary | ICD-10-CM

## 2017-08-29 DIAGNOSIS — M25611 Stiffness of right shoulder, not elsewhere classified: Secondary | ICD-10-CM

## 2017-08-29 NOTE — Therapy (Signed)
[place], [place], [address] Phone: [phone]   Fax:  [phone]  Physical Therapy Treatment  Patient Details  Name: Susan Wilson MRN: [id number] Date of Birth: [birth year] Referring Provider: Dr Jana Hakim    Encounter Date: 08/29/2017  PT End of Session - 08/29/17 0932    Visit Number  5    Number of Visits  9    Date for PT Re-Evaluation  09/15/17    PT Start Time  0845    PT Stop Time  0931    PT Time Calculation (min)  46 min    Activity Tolerance  Patient tolerated treatment well    Behavior During Therapy  [facility] for tasks assessed/performed       Past Medical History:  Diagnosis Date  . Anemia   . Breast asymmetry 12/2015  . Breast cancer ([place]) 2017   right breast  . History of breast cancer 11/2014   right  . History of chemotherapy   . Migraines   . 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 Personal history of chemotherapy 2017  . Personal history of radiation therapy 2017    Past Surgical History:  Procedure Laterality Date  . BREAST LUMPECTOMY Right 2017  . BREAST RECONSTRUCTION Right 06/23/2015   Procedure: ONCOPLASTIC RIGHT BREAST REDUCTION;  Surgeon: Irene Limbo, MD;  Location: [place];  Service: Plastics;  Laterality: Right;  . BREAST REDUCTION SURGERY Left 06/23/2015   Procedure: MAMMARY REDUCTION  (BREAST) LEFT BREAST FOR SYMETRY (BILATERAL BREAST REDUCTION);  Surgeon: Irene Limbo, MD;  Location: [place];  Service: Plastics;  Laterality: Left;  . BREAST REDUCTION SURGERY Left 01/15/2016   Procedure: MAMMARY REDUCTION  (BREAST) LEFT;  Surgeon: Irene Limbo, MD;  Location: [place];  Service: Plastics;  Laterality: Left;  Marland Kitchen MASTOPEXY Left 01/15/2016   Procedure: POSSIBLE LEFT MASTOPEXY;  Surgeon: Irene Limbo, MD;  Location: [place];  Service: Plastics;   Laterality: Left;  . PORT-A-CATH REMOVAL Right 01/15/2016   Procedure: REMOVAL OF RIGHT PORT-A-CATH;  Surgeon: Irene Limbo, MD;  Location: [place];  Service: Plastics;  Laterality: Right;  . PORTACATH PLACEMENT Right 12/16/2014   Procedure: INSERTION PORT-A-CATH WITH ULTRASOUND;  Surgeon: Rolm Bookbinder, MD;  Location: [place];  Service: General;  Laterality: Right;  . RADIOACTIVE SEED GUIDED PARTIAL MASTECTOMY WITH AXILLARY SENTINEL LYMPH NODE BIOPSY Right 06/15/2015   Procedure: RADIOACTIVE SEED GUIDED PARTIAL MASTECTOMY WITH AXILLARY SENTINEL LYMPH NODE BIOPSY;  Surgeon: Rolm Bookbinder, MD;  Location: [place];  Service: General;  Laterality: Right;  . REDUCTION MAMMAPLASTY Bilateral 2017    There were no vitals filed for this visit.  Subjective Assessment - 08/29/17 0850    Subjective  My Rt shoulder felt much better after last visit. The stretching helped and then I went home and put my sleeve and glove on. My mom wrapped my arm over the weekend for me becasue it was feeling full and that helped. I left that on for a few days. My endurance varies just depending on the day. I feel like the weather is a pretty big factor, but overall I think I'm starting to see some improvements. I heard from the compression pump company and they are submitting my paperwork to see if I've met my deductible and we'll go from there.     Pertinent History  Right lumpectomy with 6 nodes removed  end of January 2017, and had breast reduction on both sides, she had another reduction on the left side in September of 2017  Had chemotherapy before surgery and completed radiation October 19, 2015.  Has been seen here before for lymphedema and shoulder ROM.  Has compression day ( and glove)  and nighttime sleeve.  She does some taping at home when her she has more swelling and spasms ,  Still has a pulley at home that she uses, and Theraband (yellow)  Portacath was removed and it's still  tender there.  Now she just does follow-up visits, but no further treatment. No other health issues.  Has neuropathy in both hands and feet, with reported numbness and tingling. Still has alot of numbness that is better in feet but sitll has problems with hands.     Patient Stated Goals  she would like to have less pain and help with swelling.  08/23/2017  pt wants to get back to riding her motorcycle.     Currently in Pain?  Yes    Pain Score  2     Pain Location  Axilla    Pain Orientation  Right    Pain Descriptors / Indicators  Aching    Pain Type  Chronic pain    Pain Onset  More than a month ago    Pain Frequency  Intermittent    Aggravating Factors   cold weather    Pain Relieving Factors  warm weather, and stretching         [facility] PT Assessment - 08/29/17 0001      AROM   Right Shoulder Extension  43 Degrees    Right Shoulder Flexion  156 Degrees    Right Shoulder ABduction  169 Degrees    Right Shoulder Internal Rotation  67 Degrees    Right Shoulder External Rotation  85 Degrees                   [facility] Adult PT Treatment/Exercise - 08/29/17 0001      Lumbar Exercises: Aerobic   Tread Mill  10 minutes at 1.5 mph. 0% grade with RPE at 3/10       Lumbar Exercises: Seated   Hip Flexion on Ball  AROM;Right;Left;10 reps    Other Seated Lumbar Exercises  On exercise ball for pelvic tilts forward, back and lateral as well as bil circles x1 min each with demonstration for reminders for correct techique.     Other Seated Lumbar Exercises  On ball for alternate UE abduction 2# , x20 reps, then alt bicep curls with 2# x20 reps each      Lumbar Exercises: Supine   Bent Knee Raise  10 reps Posterior pelvic tilt, VCs to try to hold tilt throughout    Bridge  10 reps;3 seconds    Bridge with [employer]  10 reps;2 seconds    Other Supine Lumbar Exercises  Reverse curls 2 x 5 with demonstration for technique then VCs for correct LE positioning, pt then able to return  correct technique      Shoulder Exercises: Pulleys   Flexion  2 minutes VCs for hold at end of ROM    ABduction  2 minutes                  PT Long Term Goals - 08/16/17 1240      PT LONG TERM GOAL #1   Title  Pt will report the pain and discomfort in  her right upper quadant is decreased by 50%    Time  4    Period  Weeks    Status  New      PT LONG TERM GOAL #2   Title  Pt will report she will be able to use the compression pump to help with her symptoms of lymphedema in her right upper quadrant     Time  4    Period  Weeks    Status  New      PT LONG TERM GOAL #3   Title  Pt will increase the range of motin of right shoulder extension to to 40 degrees     Time  4    Period  Weeks    Status  New      PT LONG TERM GOAL #4   Title  Pt will be independent in a strengthening program to left shoulder     Time  4    Period  Weeks    Status  New      PT LONG TERM GOAL #5   Title  Pt will have correctly fitting sleeve and glove to continue to manage her lymphedema at home     Time  4    Period  Weeks    Status  New            Plan - 08/29/17 0933    Clinical Impression Statement  Continued with endurance strengthening with treadmill which she is tolerating well, though at a slower pace today than last time. Continue with core strengthening exercises on the ball but also included supine core strength as well today. Pt reported feeling good after session, especially with new core exercises. "Felt like she got a good workout". Pt has heard from compression pump company as of last week and she reports they are submitting paperwork to insurance and will be in contact with her.    Rehab Potential  Good    Clinical Impairments Affecting Rehab Potential  as above     PT Frequency  2x / week    PT Duration  4 weeks    PT Treatment/Interventions  ADLs/Self Care Home Management;Therapeutic activities;Therapeutic exercise;Patient/family education;Manual techniques;Manual  lymph drainage;Scar mobilization;Passive range of motion;Taping;DME Instruction;Orthotic Fit/Training;Compression bandaging    PT Next Visit Plan  Periodically check in on progress with compression pump and facilitate getting more compression garments if needed. cont with  joint mobilizaion to increase shoulder extension, manual techniques to tight muscles of right scapular area, strengthening to right UE and core, especially with pelvic movement     Consulted and Agree with Plan of Care  Patient       Patient will benefit from skilled therapeutic intervention in order to improve the following deficits and impairments:  Decreased range of motion, Decreased strength, Increased edema, Impaired UE functional use, Pain, Increased fascial restricitons, Postural dysfunction, Increased muscle spasms  Visit Diagnosis: Stiffness of right shoulder, not elsewhere classified  Right shoulder pain, unspecified chronicity  Abnormal posture  Muscle weakness (generalized)  Lymphedema, not elsewhere classified     Problem List Patient Active Problem List   Diagnosis Date Noted  . Port catheter in place 12/28/2015  . Chemotherapy induced neutropenia ([place]) 05/14/2015  . 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 Tooth ache 01/09/2015  . Genetic testing 12/26/2014  . Family history of breast cancer   . Malignant neoplasm of lower-inner quadrant of right breast of female, estrogen receptor negative (  [place]) 11/25/2014  . Migraine 11/19/2014    Otelia Limes, PTA 08/29/2017, 10:10 AM  [place], [place], [address] Phone: [phone]   Fax:  [phone]  Name: Susan Wilson MRN: [id number] Date of Birth: 05-Mar-1976

## 2017-08-31 ENCOUNTER — Ambulatory Visit: Payer: 59

## 2017-08-31 DIAGNOSIS — R293 Abnormal posture: Secondary | ICD-10-CM

## 2017-08-31 DIAGNOSIS — M6281 Muscle weakness (generalized): Secondary | ICD-10-CM

## 2017-08-31 DIAGNOSIS — M25511 Pain in right shoulder: Secondary | ICD-10-CM

## 2017-08-31 DIAGNOSIS — I890 Lymphedema, not elsewhere classified: Secondary | ICD-10-CM

## 2017-08-31 DIAGNOSIS — M25611 Stiffness of right shoulder, not elsewhere classified: Secondary | ICD-10-CM

## 2017-08-31 NOTE — Therapy (Signed)
[place], [place], [address] Phone: [phone]   Fax:  [phone]  Physical Therapy Treatment  Patient Details  Name: Susan Wilson MRN: [id number] Date of Birth: May 07, 1976 Referring Provider: Dr Jana Hakim    Encounter Date: 08/31/2017  PT End of Session - 08/31/17 0903    Visit Number  6    Number of Visits  9    Date for PT Re-Evaluation  09/15/17    PT Start Time  0845    PT Stop Time  0932    PT Time Calculation (min)  47 min    Activity Tolerance  Patient tolerated treatment well    Behavior During Therapy  [facility] for tasks assessed/performed       Past Medical History:  Diagnosis Date  . Anemia   . Breast asymmetry 12/2015  . Breast cancer ([place]) 2017   right breast  . History of breast cancer 11/2014   right  . History of chemotherapy   . Migraines   . 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 Personal history of chemotherapy 2017  . Personal history of radiation therapy 2017    Past Surgical History:  Procedure Laterality Date  . BREAST LUMPECTOMY Right 2017  . BREAST RECONSTRUCTION Right 06/23/2015   Procedure: ONCOPLASTIC RIGHT BREAST REDUCTION;  Surgeon: Irene Limbo, MD;  Location: [place];  Service: Plastics;  Laterality: Right;  . BREAST REDUCTION SURGERY Left 06/23/2015   Procedure: MAMMARY REDUCTION  (BREAST) LEFT BREAST FOR SYMETRY (BILATERAL BREAST REDUCTION);  Surgeon: Irene Limbo, MD;  Location: [place];  Service: Plastics;  Laterality: Left;  . BREAST REDUCTION SURGERY Left 01/15/2016   Procedure: MAMMARY REDUCTION  (BREAST) LEFT;  Surgeon: Irene Limbo, MD;  Location: [place];  Service: Plastics;  Laterality: Left;  Marland Kitchen MASTOPEXY Left 01/15/2016   Procedure: POSSIBLE LEFT MASTOPEXY;  Surgeon: Irene Limbo, MD;  Location: [facility];  Service: Plastics;   Laterality: Left;  . PORT-A-CATH REMOVAL Right 01/15/2016   Procedure: REMOVAL OF RIGHT PORT-A-CATH;  Surgeon: Irene Limbo, MD;  Location: [facility];  Service: Plastics;  Laterality: Right;  . PORTACATH PLACEMENT Right 12/16/2014   Procedure: INSERTION PORT-A-CATH WITH ULTRASOUND;  Surgeon: Rolm Bookbinder, MD;  Location: [facility];  Service: General;  Laterality: Right;  . RADIOACTIVE SEED GUIDED PARTIAL MASTECTOMY WITH AXILLARY SENTINEL LYMPH NODE BIOPSY Right 06/15/2015   Procedure: RADIOACTIVE SEED GUIDED PARTIAL MASTECTOMY WITH AXILLARY SENTINEL LYMPH NODE BIOPSY;  Surgeon: Rolm Bookbinder, MD;  Location: [place];  Service: General;  Laterality: Right;  . REDUCTION MAMMAPLASTY Bilateral 2017    There were no vitals filed for this visit.  Subjective Assessment - 08/31/17 0849    Subjective  Nothing new since I was here at last visit. Felt good after visit. Just some achiness in my Rt armpit today, it's not feeling bad.    Pertinent History  Right lumpectomy with 6 nodes removed end of January 2017, and had breast reduction on both sides, she had another reduction on the left side in September of 2017  Had chemotherapy before surgery and completed radiation October 19, 2015.  Has been seen here before for lymphedema and shoulder ROM.  Has compression day ( and glove)  and nighttime sleeve.  She does some taping at home when her she has more swelling and spasms ,  Still has a pulley at home  that she uses, and Theraband (yellow)  Portacath was removed and it's still tender there.  Now she just does follow-up visits, but no further treatment. No other health issues.  Has neuropathy in both hands and feet, with reported numbness and tingling. Still has alot of numbness that is better in feet but sitll has problems with hands.     Patient Stated Goals  she would like to have less pain and help with swelling.  08/23/2017  pt wants to get back to riding her motorcycle.      Currently in Pain?  No/denies            LYMPHEDEMA/ONCOLOGY QUESTIONNAIRE - 08/31/17 0919      Right Upper Extremity Lymphedema   15 cm Proximal to Olecranon Process  39.1 cm    10 cm Proximal to Olecranon Process  37.6 cm    Olecranon Process  28.8 cm    15 cm Proximal to Ulnar Styloid Process  29.7 cm    10 cm Proximal to Ulnar Styloid Process  26.9 cm    Just Proximal to Ulnar Styloid Process  19.2 cm    Across Hand at [employer]  20.6 cm    At [place] of 2nd Digit  6.4 cm                [facility] Adult PT Treatment/Exercise - 08/31/17 0001      Lumbar Exercises: Aerobic   Tread Mill  10 minutes at 1.5 mph. 2% grade with RPE at 2/10       Lumbar Exercises: Standing   Functional Squats  10 reps 2 sets in treadmill on bosu dome down      Lumbar Exercises: Seated   Other Seated Lumbar Exercises  On ball for alternate UE abduction 2# , x20 reps, then alt bicep curls with 2# x20 reps each; Bil UE's in 90 degrees abd, then with lateral reach x10 each direction as far as pt able with maintaining control.      Shoulder Exercises: Pulleys   Flexion  2 minutes VCs for pt to slow pace and hold stretch    ABduction  2 minutes      Shoulder Exercises: Therapy Ball   Flexion  Both;10 reps 1 lb to Rt wrist with forward lean into end of stretch    ABduction  Right;5 reps 1 lb to Rt wrist with same side lean into end of stretch      Manual Therapy   Passive ROM  In Supine to Rt shoulder into flexion, abduction and D2 to pts end ROM                  PT Long Term Goals - 08/16/17 1240      PT LONG TERM GOAL #1   Title  Pt will report the pain and discomfort in her right upper quadant is decreased by 50%    Time  4    Period  Weeks    Status  New      PT LONG TERM GOAL #2   Title  Pt will report she will be able to use the compression pump to help with her symptoms of lymphedema in her right upper quadrant     Time  4    Period  Weeks    Status  New       PT LONG TERM GOAL #3   Title  Pt will increase the range of motin of right shoulder extension  to to 40 degrees     Time  4    Period  Weeks    Status  New      PT LONG TERM GOAL #4   Title  Pt will be independent in a strengthening program to left shoulder     Time  4    Period  Weeks    Status  New      PT LONG TERM GOAL #5   Title  Pt will have correctly fitting sleeve and glove to continue to manage her lymphedema at home     Time  4    Period  Weeks    Status  New            Plan - 08/31/17 0904    Clinical Impression Statement  Pt is tolerating endurance and UE stengthening well, including exercises seated on the ball which pt is progressing well with balance as she has hopes of being able to ride her motorcycle once her balance improves and hand numbness improves. Also remeasured pts Rt UE circumference today and other than her wrist all measurements were reduced or unchanged. She reports doing well wearing compression sleeve and glove daily and nighttime garment most nights.     Rehab Potential  Good    Clinical Impairments Affecting Rehab Potential  as above     PT Frequency  2x / week    PT Duration  4 weeks    PT Treatment/Interventions  ADLs/Self Care Home Management;Therapeutic activities;Therapeutic exercise;Patient/family education;Manual techniques;Manual lymph drainage;Scar mobilization;Passive range of motion;Taping;DME Instruction;Orthotic Fit/Training;Compression bandaging    PT Next Visit Plan  Periodically check in on progress with compression pump and facilitate getting more compression garments if needed. cont with  joint mobilizaion to increase shoulder extension, manual techniques to tight muscles of right scapular area, strengthening to right UE and core, especially with pelvic movement     Consulted and Agree with Plan of Care  Patient       Patient will benefit from skilled therapeutic intervention in order to improve the following deficits and  impairments:  Decreased range of motion, Decreased strength, Increased edema, Impaired UE functional use, Pain, Increased fascial restricitons, Postural dysfunction, Increased muscle spasms  Visit Diagnosis: Stiffness of right shoulder, not elsewhere classified  Right shoulder pain, unspecified chronicity  Abnormal posture  Muscle weakness (generalized)  Lymphedema, not elsewhere classified     Problem List Patient Active Problem List   Diagnosis Date Noted  . Port catheter in place 12/28/2015  . Chemotherapy induced neutropenia ([place]) 05/14/2015  . 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 Tooth ache 01/09/2015  . Genetic testing 12/26/2014  . Family history of breast cancer   . Malignant neoplasm of lower-inner quadrant of right breast of female, estrogen receptor negative ([place]) 11/25/2014  . Migraine 11/19/2014    Otelia Limes, PTA 08/31/2017, 9:34 AM  [place], [place], [address] Phone: [phone]   Fax:  [phone]  Name: MIRIELLE BYRUM MRN: [id number] Date of Birth: 11/17/1975

## 2017-09-04 ENCOUNTER — Ambulatory Visit: Payer: 59

## 2017-09-04 DIAGNOSIS — M25611 Stiffness of right shoulder, not elsewhere classified: Secondary | ICD-10-CM

## 2017-09-04 DIAGNOSIS — R293 Abnormal posture: Secondary | ICD-10-CM

## 2017-09-04 DIAGNOSIS — M6281 Muscle weakness (generalized): Secondary | ICD-10-CM

## 2017-09-04 DIAGNOSIS — M25511 Pain in right shoulder: Secondary | ICD-10-CM

## 2017-09-04 NOTE — Therapy (Signed)
[place], [place], [address] Phone: [phone]   Fax:  [phone]  Physical Therapy Treatment  Patient Details  Name: Susan Wilson MRN: [id number] Date of Birth: Aug 24, 1975 Referring Provider: Dr Jana Hakim    Encounter Date: 09/04/2017  PT End of Session - 09/04/17 0921    Visit Number  7    Number of Visits  9    Date for PT Re-Evaluation  09/15/17    PT Start Time  0853    PT Stop Time  0935    PT Time Calculation (min)  42 min    Activity Tolerance  Patient tolerated treatment well    Behavior During Therapy  [facility] for tasks assessed/performed       Past Medical History:  Diagnosis Date  . Anemia   . Breast asymmetry 12/2015  . Breast cancer ([place]) 2017   right breast  . History of breast cancer 11/2014   right  . History of chemotherapy   . Migraines   . 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 Personal history of chemotherapy 2017  . Personal history of radiation therapy 2017    Past Surgical History:  Procedure Laterality Date  . BREAST LUMPECTOMY Right 2017  . BREAST RECONSTRUCTION Right 06/23/2015   Procedure: ONCOPLASTIC RIGHT BREAST REDUCTION;  Surgeon: Irene Limbo, MD;  Location: [place];  Service: Plastics;  Laterality: Right;  . BREAST REDUCTION SURGERY Left 06/23/2015   Procedure: MAMMARY REDUCTION  (BREAST) LEFT BREAST FOR SYMETRY (BILATERAL BREAST REDUCTION);  Surgeon: Irene Limbo, MD;  Location: [place];  Service: Plastics;  Laterality: Left;  . BREAST REDUCTION SURGERY Left 01/15/2016   Procedure: MAMMARY REDUCTION  (BREAST) LEFT;  Surgeon: Irene Limbo, MD;  Location: [place];  Service: Plastics;  Laterality: Left;  Marland Kitchen MASTOPEXY Left 01/15/2016   Procedure: POSSIBLE LEFT MASTOPEXY;  Surgeon: Irene Limbo, MD;  Location: [place];  Service: Plastics;   Laterality: Left;  . PORT-A-CATH REMOVAL Right 01/15/2016   Procedure: REMOVAL OF RIGHT PORT-A-CATH;  Surgeon: Irene Limbo, MD;  Location: [place];  Service: Plastics;  Laterality: Right;  . PORTACATH PLACEMENT Right 12/16/2014   Procedure: INSERTION PORT-A-CATH WITH ULTRASOUND;  Surgeon: Rolm Bookbinder, MD;  Location: [place];  Service: General;  Laterality: Right;  . RADIOACTIVE SEED GUIDED PARTIAL MASTECTOMY WITH AXILLARY SENTINEL LYMPH NODE BIOPSY Right 06/15/2015   Procedure: RADIOACTIVE SEED GUIDED PARTIAL MASTECTOMY WITH AXILLARY SENTINEL LYMPH NODE BIOPSY;  Surgeon: Rolm Bookbinder, MD;  Location: [place];  Service: General;  Laterality: Right;  . REDUCTION MAMMAPLASTY Bilateral 2017    There were no vitals filed for this visit.  Subjective Assessment - 09/04/17 0856    Subjective  I was really achy yesterday due to the cold weather. I tried stretching and doing the manual lymph drainage but it just didn't help so I finally had to take a muscle relaxer. So I'm still a little sore just from hurting yesterday.     Pertinent History  Right lumpectomy with 6 nodes removed end of January 2017, and had breast reduction on both sides, she had another reduction on the left side in September of 2017  Had chemotherapy before surgery and completed radiation October 19, 2015.  Has been seen here before for lymphedema and shoulder ROM.  Has compression day ( and glove)  and nighttime sleeve.  She does some  taping at home when her she has more swelling and spasms ,  Still has a pulley at home that she uses, and Theraband (yellow)  Portacath was removed and it's still tender there.  Now she just does follow-up visits, but no further treatment. No other health issues.  Has neuropathy in both hands and feet, with reported numbness and tingling. Still has alot of numbness that is better in feet but sitll has problems with hands.     Patient Stated Goals  she would like to have  less pain and help with swelling.  08/23/2017  pt wants to get back to riding her motorcycle.     Currently in Pain?  Yes    Pain Score  3     Pain Location  Axilla    Pain Orientation  Right    Pain Descriptors / Indicators  Aching;Sore    Pain Type  Chronic pain    Pain Radiating Towards  breast and flank    Pain Onset  More than a month ago    Pain Frequency  Intermittent    Aggravating Factors   cold weather    Pain Relieving Factors  warm weather and stretching                       [facility] Adult PT Treatment/Exercise - 09/04/17 0001      Lumbar Exercises: Aerobic   Tread Mill  10 minutes at 1.5-1.7 mph. 2% grade with RPE at 2/10       Lumbar Exercises: Seated   Other Seated Lumbar Exercises  On exercise ball for pelvic tilts forward, back and lateral as well as bil circles x1 min each with demonstration for reminders for correct techique.     Other Seated Lumbar Exercises  On ball for alternate UE abduction 2# x20 reps, alternate flexion 2#, x20 reps, then alt bicep curls with 2# x20 reps each; Bil UE's in 90 degrees abd.      Shoulder Exercises: Therapy Ball   Flexion  Both;10 reps 1 lb on wrist; forward lean into end of stretch    ABduction  Right;10 reps 1 lb on wrist; same side lean into end of stretch      Shoulder Exercises: Stretch   Other Shoulder Stretches  Modified downward dog on wall 5x, 5 sec holds      Manual Therapy   Manual Therapy  Myofascial release;Passive ROM    Soft tissue mobilization  --    Myofascial Release  To Rt axilla and flank during P/ROM    Passive ROM  In Supine to Rt shoulder into flexion, abduction and D2 to pts end ROM                  PT Long Term Goals - 08/16/17 1240      PT LONG TERM GOAL #1   Title  Pt will report the pain and discomfort in her right upper quadant is decreased by 50%    Time  4    Period  Weeks    Status  New      PT LONG TERM GOAL #2   Title  Pt will report she will be able to use  the compression pump to help with her symptoms of lymphedema in her right upper quadrant     Time  4    Period  Weeks    Status  New      PT LONG TERM GOAL #3  Title  Pt will increase the range of motin of right shoulder extension to to 40 degrees     Time  4    Period  Weeks    Status  New      PT LONG TERM GOAL #4   Title  Pt will be independent in a strengthening program to left shoulder     Time  4    Period  Weeks    Status  New      PT LONG TERM GOAL #5   Title  Pt will have correctly fitting sleeve and glove to continue to manage her lymphedema at home     Time  4    Period  Weeks    Status  New            Plan - 09/04/17 8757    Clinical Impression Statement  Pt reported having a set back yesterday with cooler weather over the weekend causing muscle spasms so after exercises forcused on manual therapy to Rt axilla/flank and Rt shoulder P/ROM. Pt reported feeling looser by end of session and tightness improved.     Rehab Potential  Good    Clinical Impairments Affecting Rehab Potential  as above     PT Frequency  2x / week    PT Duration  4 weeks    PT Treatment/Interventions  ADLs/Self Care Home Management;Therapeutic activities;Therapeutic exercise;Patient/family education;Manual techniques;Manual lymph drainage;Scar mobilization;Passive range of motion;Taping;DME Instruction;Orthotic Fit/Training;Compression bandaging    PT Next Visit Plan  Periodically check in on progress with compression pump and facilitate getting more compression garments if needed. cont with  joint mobilizaion to increase shoulder extension, manual techniques to tight muscles of right scapular area, strengthening to right UE and core, especially with pelvic movement     Consulted and Agree with Plan of Care  Patient       Patient will benefit from skilled therapeutic intervention in order to improve the following deficits and impairments:  Decreased range of motion, Decreased strength,  Increased edema, Impaired UE functional use, Pain, Increased fascial restricitons, Postural dysfunction, Increased muscle spasms  Visit Diagnosis: Stiffness of right shoulder, not elsewhere classified  Right shoulder pain, unspecified chronicity  Abnormal posture  Muscle weakness (generalized)     Problem List Patient Active Problem List   Diagnosis Date Noted  . Port catheter in place 12/28/2015  . Chemotherapy induced neutropenia ([place]) 05/14/2015  . 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 Tooth ache 01/09/2015  . Genetic testing 12/26/2014  . Family history of breast cancer   . Malignant neoplasm of lower-inner quadrant of right breast of female, estrogen receptor negative ([place]) 11/25/2014  . Migraine 11/19/2014    Otelia Limes, PTA 09/04/2017, 9:38 AM  [place], [place], [address] Phone: [phone]   Fax:  [phone]  Name: ATHALIE NEWHARD MRN: [id number] Date of Birth: [birth year]

## 2017-09-06 ENCOUNTER — Ambulatory Visit: Payer: 59

## 2017-09-06 DIAGNOSIS — M25511 Pain in right shoulder: Secondary | ICD-10-CM

## 2017-09-06 DIAGNOSIS — M6281 Muscle weakness (generalized): Secondary | ICD-10-CM

## 2017-09-06 DIAGNOSIS — M25611 Stiffness of right shoulder, not elsewhere classified: Secondary | ICD-10-CM

## 2017-09-06 DIAGNOSIS — R293 Abnormal posture: Secondary | ICD-10-CM

## 2017-09-06 DIAGNOSIS — I890 Lymphedema, not elsewhere classified: Secondary | ICD-10-CM

## 2017-09-06 NOTE — Therapy (Signed)
[place], [place], [address] Phone: [phone]   Fax:  [phone]  Physical Therapy Treatment  Patient Details  Name: Susan Wilson MRN: [id number] Date of Birth: [birth year] Referring Provider: Dr Jana Hakim    Encounter Date: 09/06/2017  PT End of Session - 09/06/17 1010    Visit Number  8    Number of Visits  9    Date for PT Re-Evaluation  09/15/17    PT Start Time  0937    PT Stop Time  1020    PT Time Calculation (min)  43 min    Activity Tolerance  Patient tolerated treatment well    Behavior During Therapy  [facility] for tasks assessed/performed       Past Medical History:  Diagnosis Date  . Anemia   . Breast asymmetry 12/2015  . Breast cancer ([place]) 2017   right breast  . History of breast cancer 11/2014   right  . History of chemotherapy   . Migraines   . 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 Personal history of chemotherapy 2017  . Personal history of radiation therapy 2017    Past Surgical History:  Procedure Laterality Date  . BREAST LUMPECTOMY Right 2017  . BREAST RECONSTRUCTION Right 06/23/2015   Procedure: ONCOPLASTIC RIGHT BREAST REDUCTION;  Surgeon: Irene Limbo, MD;  Location: [place];  Service: Plastics;  Laterality: Right;  . BREAST REDUCTION SURGERY Left 06/23/2015   Procedure: MAMMARY REDUCTION  (BREAST) LEFT BREAST FOR SYMETRY (BILATERAL BREAST REDUCTION);  Surgeon: Irene Limbo, MD;  Location: [place];  Service: Plastics;  Laterality: Left;  . BREAST REDUCTION SURGERY Left 01/15/2016   Procedure: MAMMARY REDUCTION  (BREAST) LEFT;  Surgeon: Irene Limbo, MD;  Location: [place];  Service: Plastics;  Laterality: Left;  Marland Kitchen MASTOPEXY Left 01/15/2016   Procedure: POSSIBLE LEFT MASTOPEXY;  Surgeon: Irene Limbo, MD;  Location: [place];  Service: Plastics;   Laterality: Left;  . PORT-A-CATH REMOVAL Right 01/15/2016   Procedure: REMOVAL OF RIGHT PORT-A-CATH;  Surgeon: Irene Limbo, MD;  Location: [place];  Service: Plastics;  Laterality: Right;  . PORTACATH PLACEMENT Right 12/16/2014   Procedure: INSERTION PORT-A-CATH WITH ULTRASOUND;  Surgeon: Rolm Bookbinder, MD;  Location: [place];  Service: General;  Laterality: Right;  . RADIOACTIVE SEED GUIDED PARTIAL MASTECTOMY WITH AXILLARY SENTINEL LYMPH NODE BIOPSY Right 06/15/2015   Procedure: RADIOACTIVE SEED GUIDED PARTIAL MASTECTOMY WITH AXILLARY SENTINEL LYMPH NODE BIOPSY;  Surgeon: Rolm Bookbinder, MD;  Location: [place];  Service: General;  Laterality: Right;  . REDUCTION MAMMAPLASTY Bilateral 2017    There were no vitals filed for this visit.  Subjective Assessment - 09/06/17 0944    Subjective  Was a little sore after Mondays visit just from the exercises but I use my kinesiotape and Arnica Gel and I'm feeling better today.     Pertinent History  Right lumpectomy with 6 nodes removed end of January 2017, and had breast reduction on both sides, she had another reduction on the left side in September of 2017  Had chemotherapy before surgery and completed radiation October 19, 2015.  Has been seen here before for lymphedema and shoulder ROM.  Has compression day ( and glove)  and nighttime sleeve.  She does some taping at home when her she has more swelling and spasms ,  Still has a pulley at home  that she uses, and Theraband (yellow)  Portacath was removed and it's still tender there.  Now she just does follow-up visits, but no further treatment. No other health issues.  Has neuropathy in both hands and feet, with reported numbness and tingling. Still has alot of numbness that is better in feet but sitll has problems with hands.     Patient Stated Goals  she would like to have less pain and help with swelling.  08/23/2017  pt wants to get back to riding her motorcycle.      Currently in Pain?  Yes    Pain Score  2     Pain Location  Axilla    Pain Orientation  Right    Pain Descriptors / Indicators  Aching;Sore    Pain Type  Chronic pain    Pain Onset  More than a month ago    Pain Frequency  Intermittent    Aggravating Factors   cold weather    Pain Relieving Factors  kinesiotape and stretching                       [facility] Adult PT Treatment/Exercise - 09/06/17 0001      Lumbar Exercises: Aerobic   Tread Mill  11 minutes at 1.7 mph. 2% grade with RPE at 4/10       Lumbar Exercises: Seated   Other Seated Lumbar Exercises  On exercise ball for pelvic tilts forward, back and lateral as well as bil circles x1 min each with demonstration for reminders for correct techique.       Shoulder Exercises: Supine   Horizontal ABduction  Strengthening;Both;10 reps;Theraband    Theraband Level (Shoulder Horizontal ABduction)  Level 2 (Red)    Horizontal ABduction Limitations  Pt did this with limited ROM reporting pull at axilla though she is able to stretch further than this with AA / and P/ROM    External Rotation  Strengthening;Both;10 reps;Theraband    Theraband Level (Shoulder External Rotation)  Level 2 (Red)    Flexion  Strengthening;Both;10 reps;Theraband Narrow and Wide Grip, 10 times each    Theraband Level (Shoulder Flexion)  Level 2 (Red)    Diagonals  Strengthening;Right;Left;10 reps;Theraband D2    Theraband Level (Shoulder Diagonals)  Level 2 (Red)      Shoulder Exercises: Seated   Other Seated Exercises  Seated on physioball: Holding 2 lbs for all: Bicep curls, alternate abduction and then alternate flexion x20 each.       Shoulder Exercises: Therapy Ball   Flexion  Both;10 reps 1 lb on wrist; With forward lean into end of stretch    ABduction  Right;10 reps 1 lb on wrist; same side lean into end of stretch      Manual Therapy   Manual Therapy  Myofascial release;Passive ROM    Myofascial Release  To Rt axilla and flank during  P/ROM    Passive ROM  In Supine to Rt shoulder into flexion, abduction and D2 to pts end ROM             PT Education - 09/06/17 1012    Education provided  Yes    Education Details  Supine scapular series with red theraband    Person(s) Educated  Patient    Methods  Explanation;Demonstration;Handout    Comprehension  Verbalized understanding;Returned demonstration;Need further instruction          PT Long Term Goals - 09/06/17 9563      PT  LONG TERM GOAL #1   Title  Pt will report the pain and discomfort in her right upper quadant is decreased by 50%    Baseline  Pt reports 30% improvement at this time-09/06/17    Status  On-going      PT LONG TERM GOAL #2   Title  Pt will report she will be able to use the compression pump to help with her symptoms of lymphedema in her right upper quadrant     Baseline  Pt is waiting to hear back from compression pump company as they have submitted paperwork to her insurance as of 09/06/17    Status  On-going      PT LONG TERM GOAL #3   Title  Pt will increase the range of motin of right shoulder extension to to 40 degrees       PT LONG TERM GOAL #4   Title  Pt will be independent in a strengthening program to left shoulder     Status  Partially Met      PT LONG TERM GOAL #5   Title  Pt will have correctly fitting sleeve and glove to continue to manage her lymphedema at home     Baseline  Pt has been wearing compression sleeve and glove a few hours/day most days, but is interested in getting new garments-09/06/17    Status  On-going            Plan - 09/06/17 1016    Clinical Impression Statement  Pt continues to make good progress towards goals and reports her overall pain has reduced up to 30% since start of care. Progressed her to include supine scapular series with red theraband (pt had this from previous episode of care but lost sheet and hasn't been doing them). She tolerated this very well so added to HEP. Discussed with  pt she will be at end of POC at next visit and we will decide if she is ready for D/C or possibly renew but decrease to 1x/wk. Discussed with pt also her compression garments and how even though she doesn't wear them all day every day they are over a year old so is probably time to look into getting new garments. Pt agreeable to this so will fax new order to Dr. Jana Hakim today.    Rehab Potential  Good    Clinical Impairments Affecting Rehab Potential  as above     PT Frequency  2x / week    PT Duration  4 weeks    PT Treatment/Interventions  ADLs/Self Care Home Management;Therapeutic activities;Therapeutic exercise;Patient/family education;Manual techniques;Manual lymph drainage;Scar mobilization;Passive range of motion;Taping;DME Instruction;Orthotic Fit/Training;Compression bandaging    PT Next Visit Plan  Decide on D/C or renewal next visit, if renewal consider 1x/wk as pt is progressing/doing very well. Issue garment script if returned signed with phone number for [facility]. cont with  joint mobilizaion to increase shoulder extension, manual techniques to tight muscles of right scapular area, strengthening to right UE and core, especially with pelvic movement     Recommended Other Services  Script sent to Dr. Jana Hakim for new compression garments    Consulted and Agree with Plan of Care  Patient       Patient will benefit from skilled therapeutic intervention in order to improve the following deficits and impairments:  Decreased range of motion, Decreased strength, Increased edema, Impaired UE functional use, Pain, Increased fascial restricitons, Postural dysfunction, Increased muscle spasms  Visit Diagnosis: Stiffness of right  shoulder, not elsewhere classified  Right shoulder pain, unspecified chronicity  Abnormal posture  Muscle weakness (generalized)  Lymphedema, not elsewhere classified     Problem List Patient Active Problem List   Diagnosis Date Noted  . Port catheter  in place 12/28/2015  . Chemotherapy induced neutropenia ([place]) 05/14/2015  . Chemotherapy-induced neuropathy ([place]) 04/08/2015  . 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 Tooth ache 01/09/2015  . Genetic testing 12/26/2014  . Family history of breast cancer   . Malignant neoplasm of lower-inner quadrant of right breast of female, estrogen receptor negative ([place]) 11/25/2014  . Migraine 11/19/2014    Otelia Limes, PTA 09/06/2017, 10:48 AM  [place], [place], [address] Phone: [phone]   Fax:  [phone]  Name: Susan Wilson MRN: [id number] Date of Birth: 10-18-1975

## 2017-09-06 NOTE — Patient Instructions (Signed)

## 2017-09-11 ENCOUNTER — Ambulatory Visit: Payer: PRIVATE HEALTH INSURANCE

## 2017-09-11 DIAGNOSIS — M6281 Muscle weakness (generalized): Secondary | ICD-10-CM

## 2017-09-11 DIAGNOSIS — M25511 Pain in right shoulder: Secondary | ICD-10-CM

## 2017-09-11 DIAGNOSIS — R293 Abnormal posture: Secondary | ICD-10-CM

## 2017-09-11 DIAGNOSIS — M25611 Stiffness of right shoulder, not elsewhere classified: Secondary | ICD-10-CM

## 2017-09-11 NOTE — Therapy (Signed)
[place], [place], [address] Phone: [phone]   Fax:  [phone]  Physical Therapy Treatment  Patient Details  Name: Susan Wilson MRN: [id number] Date of Birth: 01-14-1976 Referring Provider: Dr Jana Hakim    Encounter Date: 09/11/2017  PT End of Session - 09/11/17 0945    Visit Number  9    Number of Visits  9    Date for PT Re-Evaluation  10/09/17    PT Start Time  0931    PT Stop Time  1015    PT Time Calculation (min)  44 min    Activity Tolerance  Patient tolerated treatment well    Behavior During Therapy  [facility] for tasks assessed/performed       Past Medical History:  Diagnosis Date  . Anemia   . Breast asymmetry 12/2015  . Breast cancer ([place]) 2017   right breast  . History of breast cancer 11/2014   right  . History of chemotherapy   . Migraines   . 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 Personal history of chemotherapy 2017  . Personal history of radiation therapy 2017    Past Surgical History:  Procedure Laterality Date  . BREAST LUMPECTOMY Right 2017  . BREAST RECONSTRUCTION Right 06/23/2015   Procedure: ONCOPLASTIC RIGHT BREAST REDUCTION;  Surgeon: Irene Limbo, MD;  Location: [place];  Service: Plastics;  Laterality: Right;  . BREAST REDUCTION SURGERY Left 06/23/2015   Procedure: MAMMARY REDUCTION  (BREAST) LEFT BREAST FOR SYMETRY (BILATERAL BREAST REDUCTION);  Surgeon: Irene Limbo, MD;  Location: [place];  Service: Plastics;  Laterality: Left;  . BREAST REDUCTION SURGERY Left 01/15/2016   Procedure: MAMMARY REDUCTION  (BREAST) LEFT;  Surgeon: Irene Limbo, MD;  Location: [place];  Service: Plastics;  Laterality: Left;  Marland Kitchen MASTOPEXY Left 01/15/2016   Procedure: POSSIBLE LEFT MASTOPEXY;  Surgeon: Irene Limbo, MD;  Location: [place];  Service: Plastics;   Laterality: Left;  . PORT-A-CATH REMOVAL Right 01/15/2016   Procedure: REMOVAL OF RIGHT PORT-A-CATH;  Surgeon: Irene Limbo, MD;  Location: [place];  Service: Plastics;  Laterality: Right;  . PORTACATH PLACEMENT Right 12/16/2014   Procedure: INSERTION PORT-A-CATH WITH ULTRASOUND;  Surgeon: Rolm Bookbinder, MD;  Location: [place];  Service: General;  Laterality: Right;  . RADIOACTIVE SEED GUIDED PARTIAL MASTECTOMY WITH AXILLARY SENTINEL LYMPH NODE BIOPSY Right 06/15/2015   Procedure: RADIOACTIVE SEED GUIDED PARTIAL MASTECTOMY WITH AXILLARY SENTINEL LYMPH NODE BIOPSY;  Surgeon: Rolm Bookbinder, MD;  Location: [place];  Service: General;  Laterality: Right;  . REDUCTION MAMMAPLASTY Bilateral 2017    There were no vitals filed for this visit.  Subjective Assessment - 09/11/17 0933    Subjective  Overall I feel like I'm getting better but would like to cont 1x/wk to focus on my end ROM of my Rt shoulder. My tightness felt better over the weekend though as the weather was warmer.     Pertinent History  Right lumpectomy with 6 nodes removed end of January 2017, and had breast reduction on both sides, she had another reduction on the left side in September of 2017  Had chemotherapy before surgery and completed radiation October 19, 2015.  Has been seen here before for lymphedema and shoulder ROM.  Has compression day ( and glove)  and nighttime sleeve.  She does some taping at home when her she has  more swelling and spasms ,  Still has a pulley at home that she uses, and Theraband (yellow)  Portacath was removed and it's still tender there.  Now she just does follow-up visits, but no further treatment. No other health issues.  Has neuropathy in both hands and feet, with reported numbness and tingling. Still has alot of numbness that is better in feet but sitll has problems with hands.     Patient Stated Goals  she would like to have less pain and help with swelling.  08/23/2017   pt wants to get back to riding her motorcycle.     Currently in Pain?  Yes    Pain Score  2     Pain Location  Axilla    Pain Orientation  Right    Pain Descriptors / Indicators  Dull    Pain Type  Chronic pain    Pain Onset  More than a month ago    Pain Frequency  Intermittent    Pain Relieving Factors  warm weather         [facility] PT Assessment - 09/11/17 0001      AROM   Right Shoulder Extension  50 Degrees    Right Shoulder Flexion  156 Degrees    Right Shoulder ABduction  170 Degrees    Right Shoulder Internal Rotation  70 Degrees                   [facility] Adult PT Treatment/Exercise - 09/11/17 0001      Lumbar Exercises: Aerobic   Tread Mill  10 minutes at 1.5 mph. 2% grade with RPE 2/10 after      Shoulder Exercises: Standing   Other Standing Exercises  Back (core engaged), shoulders and head against wall for bil UE 3 way raises with 2 lbs 10 times each into flexion, scaption, and abduction to shoulder height      Shoulder Exercises: Pulleys   Flexion  2 minutes VCs to hold stretch    ABduction  2 minutes      Shoulder Exercises: Therapy Ball   Flexion  Both;10 reps 1 lb on wrist; forward lean into end of stretch    ABduction  Right;10 reps 1 lb on wrist; same side lean into end of stretch      Shoulder Exercises: Stretch   Corner Stretch  3 reps;20 seconds In doorway      Manual Therapy   Manual Therapy  Myofascial release;Passive ROM    Myofascial Release  To Rt axilla and flank during P/ROM    Passive ROM  In Supine to Rt shoulder into flexion, abduction and D2 to pts end ROM                  PT Long Term Goals - 09/11/17 0953      PT LONG TERM GOAL #1   Title  Pt will report the pain and discomfort in her right upper quadant is decreased by 50%    Baseline  Pt reports 30% improvement at this time-09/06/17; pt reports pain/discomfort feeling 50% improved over weekend-09/11/17    Status  Achieved      PT LONG TERM GOAL #2   Title   Pt will report she will be able to use the compression pump to help with her symptoms of lymphedema in her right upper quadrant     Baseline  Pt is waiting to hear back from compression pump company as they have submitted paperwork to  her insurance as of 09/06/17    Status  On-going      PT LONG TERM GOAL #3   Title  Pt will increase the range of motin of right shoulder extension to to 40 degrees     Baseline  50 degrees-09/11/17    Status  Achieved      PT LONG TERM GOAL #4   Title  Pt will be independent in a strengthening program to right shoulder     Baseline  Progressed HEP to include standing 3 way raises, pt independent with supine scapular series-09/11/17    Status  Partially Met      PT LONG TERM GOAL #5   Title  Pt will have correctly fitting sleeve and glove to continue to manage her lymphedema at home     Baseline  Pt has been wearing compression sleeve and glove a few hours/day most days, but is interested in getting new garments-09/06/17; issued new script for pt to get new garments when able-09/11/17    Status  Achieved            Plan - 09/11/17 0945    Clinical Impression Statement  Pt has overall made good progress towards goals but her end Rt shoulder A/ROM still feels tight per pt report so renewed pt today to cont to focus on end ROM, but decreasing frequency to 1x/wk.     Rehab Potential  Good    Clinical Impairments Affecting Rehab Potential  as above     PT Frequency  1x / week    PT Duration  4 weeks    PT Treatment/Interventions  ADLs/Self Care Home Management;Therapeutic activities;Therapeutic exercise;Patient/family education;Manual techniques;Manual lymph drainage;Scar mobilization;Passive range of motion;Taping;DME Instruction;Orthotic Fit/Training;Compression bandaging    PT Next Visit Plan  Renewed today for 1x/wk. Focus on end ROM of Rt shoulder.     Consulted and Agree with Plan of Care  Patient       Patient will benefit from skilled therapeutic  intervention in order to improve the following deficits and impairments:  Decreased range of motion, Decreased strength, Increased edema, Impaired UE functional use, Pain, Increased fascial restricitons, Postural dysfunction, Increased muscle spasms  Visit Diagnosis: Stiffness of right shoulder, not elsewhere classified  Right shoulder pain, unspecified chronicity  Abnormal posture  Muscle weakness (generalized)     Problem List Patient Active Problem List   Diagnosis Date Noted  . Port catheter in place 12/28/2015  . Chemotherapy induced neutropenia ([place]) 05/14/2015  . 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 Tooth ache 01/09/2015  . Genetic testing 12/26/2014  . Family history of breast cancer   . Malignant neoplasm of lower-inner quadrant of right breast of female, estrogen receptor negative ([place]) 11/25/2014  . Migraine 11/19/2014    Otelia Limes, PTA 09/11/2017, 10:02 AM  [place], [place], [address] Phone: [phone]   Fax:  [phone]  Name: Susan Wilson MRN: [id number] Date of Birth: 05-Jun-1975

## 2017-09-11 NOTE — Patient Instructions (Signed)
3 Way Raises:      Starting Position:  Leaning against wall, walk feet a few inches away from the wall and make tummy tight (tuck hips underneath you) Press back/shoulders/head against wall as much as possible. Keep thumbs up to ceiling, elbows straight and shoulders relaxed/down throughout.  1. Lift arms in front to shoulder height 2. Lift arms a little wider into a "V" to shoulder height 3. Lift arms out to sides in a "T" to shoulder height  Perform 10 times in each direction. Hold 1-2 lbs to start with and work up to 2-3 sets of 10/day. Perform 3-4 times/week. Increase weight as able, decreasing sets of 10 each time you increase weights, then slowly working your way back up to 2-3 sets each time.    Cancer Rehab [phone]

## 2017-09-22 ENCOUNTER — Ambulatory Visit: Payer: PRIVATE HEALTH INSURANCE | Attending: Oncology | Admitting: Physical Therapy

## 2017-09-22 DIAGNOSIS — M25611 Stiffness of right shoulder, not elsewhere classified: Secondary | ICD-10-CM | POA: Insufficient documentation

## 2017-09-22 DIAGNOSIS — M6281 Muscle weakness (generalized): Secondary | ICD-10-CM | POA: Insufficient documentation

## 2017-09-22 DIAGNOSIS — M25511 Pain in right shoulder: Secondary | ICD-10-CM | POA: Insufficient documentation

## 2017-09-22 DIAGNOSIS — R293 Abnormal posture: Secondary | ICD-10-CM | POA: Insufficient documentation

## 2017-09-22 NOTE — Therapy (Signed)
[place], [place], [address] Phone: [phone]   Fax:  [phone]  Physical Therapy Treatment  Patient Details  Name: Susan Wilson MRN: [id number] Date of Birth: 03/19/1976 Referring Provider: Dr Jana Hakim    Encounter Date: 09/22/2017  PT End of Session - 09/22/17 1034    Visit Number  10    Number of Visits  13    Date for PT Re-Evaluation  10/09/17    PT Start Time  0847    PT Stop Time  0931    PT Time Calculation (min)  44 min    Activity Tolerance  Patient tolerated treatment well    Behavior During Therapy  [facility] for tasks assessed/performed       Past Medical History:  Diagnosis Date  . Anemia   . Breast asymmetry 12/2015  . Breast cancer ([place]) 2017   right breast  . History of breast cancer 11/2014   right  . History of chemotherapy   . Migraines   . 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 Personal history of chemotherapy 2017  . Personal history of radiation therapy 2017    Past Surgical History:  Procedure Laterality Date  . BREAST LUMPECTOMY Right 2017  . BREAST RECONSTRUCTION Right 06/23/2015   Procedure: ONCOPLASTIC RIGHT BREAST REDUCTION;  Surgeon: Irene Limbo, MD;  Location: [place];  Service: Plastics;  Laterality: Right;  . BREAST REDUCTION SURGERY Left 06/23/2015   Procedure: MAMMARY REDUCTION  (BREAST) LEFT BREAST FOR SYMETRY (BILATERAL BREAST REDUCTION);  Surgeon: Irene Limbo, MD;  Location: [place];  Service: Plastics;  Laterality: Left;  . BREAST REDUCTION SURGERY Left 01/15/2016   Procedure: MAMMARY REDUCTION  (BREAST) LEFT;  Surgeon: Irene Limbo, MD;  Location: [place];  Service: Plastics;  Laterality: Left;  Marland Kitchen MASTOPEXY Left 01/15/2016   Procedure: POSSIBLE LEFT MASTOPEXY;  Surgeon: Irene Limbo, MD;  Location: [place];  Service: Plastics;   Laterality: Left;  . PORT-A-CATH REMOVAL Right 01/15/2016   Procedure: REMOVAL OF RIGHT PORT-A-CATH;  Surgeon: Irene Limbo, MD;  Location: [place];  Service: Plastics;  Laterality: Right;  . PORTACATH PLACEMENT Right 12/16/2014   Procedure: INSERTION PORT-A-CATH WITH ULTRASOUND;  Surgeon: Rolm Bookbinder, MD;  Location: [place];  Service: General;  Laterality: Right;  . RADIOACTIVE SEED GUIDED PARTIAL MASTECTOMY WITH AXILLARY SENTINEL LYMPH NODE BIOPSY Right 06/15/2015   Procedure: RADIOACTIVE SEED GUIDED PARTIAL MASTECTOMY WITH AXILLARY SENTINEL LYMPH NODE BIOPSY;  Surgeon: Rolm Bookbinder, MD;  Location: [place];  Service: General;  Laterality: Right;  . REDUCTION MAMMAPLASTY Bilateral 2017    There were no vitals filed for this visit.  Subjective Assessment - 09/22/17 0850    Subjective  "I can tell I'm a little swollen.  It gets a little aggravated when it gets rainy.  --Has been wearing compression sleeve.    Pertinent History  Right lumpectomy with 6 nodes removed end of January 2017, and had breast reduction on both sides, she had another reduction on the left side in September of 2017  Had chemotherapy before surgery and completed radiation October 19, 2015.  Has been seen here before for lymphedema and shoulder ROM.  Has compression day ( and glove)  and nighttime sleeve.  She does some taping at home when her she has more swelling and spasms ,  Still has a pulley at home that she  uses, and Theraband (yellow)  Portacath was removed and it's still tender there.  Now she just does follow-up visits, but no further treatment. No other health issues.  Has neuropathy in both hands and feet, with reported numbness and tingling. Still has alot of numbness that is better in feet but sitll has problems with hands.     Patient Stated Goals  she would like to have less pain and help with swelling.  08/23/2017  pt wants to get back to riding her motorcycle.     Currently  in Pain?  Yes    Pain Score  3     Pain Location  Shoulder    Pain Orientation  Right    Pain Descriptors / Indicators  Aching    Pain Type  Chronic pain    Aggravating Factors   rainy or cold    Pain Relieving Factors  keeping it warm                       [facility] Adult PT Treatment/Exercise - 09/22/17 0001      Shoulder Exercises: Supine   Horizontal ABduction  AROM;Both;10 reps lying on towel roll    Other Supine Exercises  also over towel roll, active D2 bilat. x 10      Shoulder Exercises: Pulleys   Flexion  2 minutes therapist monitoring effectiveness of stretch    ABduction  2 minutes therapist monoitoring effectiveness of stretch      Shoulder Exercises: Therapy Ball   Flexion  Both;10 reps 1 lb on wrist; forward lean into end of stretch      Manual Therapy   Myofascial Release  right UE myofascial pulling, then pulling with movement into abduction    Passive ROM  in supine to right shoulder er, abduction, and flexion; the into horizontal abduction and end range D2                  PT Long Term Goals - 09/11/17 0953      PT LONG TERM GOAL #1   Title  Pt will report the pain and discomfort in her right upper quadant is decreased by 50%    Baseline  Pt reports 30% improvement at this time-09/06/17; pt reports pain/discomfort feeling 50% improved over weekend-09/11/17    Status  Achieved      PT LONG TERM GOAL #2   Title  Pt will report she will be able to use the compression pump to help with her symptoms of lymphedema in her right upper quadrant     Baseline  Pt is waiting to hear back from compression pump company as they have submitted paperwork to her insurance as of 09/06/17    Status  On-going      PT LONG TERM GOAL #3   Title  Pt will increase the range of motin of right shoulder extension to to 40 degrees     Baseline  50 degrees-09/11/17    Status  Achieved      PT LONG TERM GOAL #4   Title  Pt will be independent in a strengthening  program to right shoulder     Baseline  Progressed HEP to include standing 3 way raises, pt independent with supine scapular series-09/11/17    Status  Partially Met      PT LONG TERM GOAL #5   Title  Pt will have correctly fitting sleeve and glove to continue to manage her lymphedema at home  Baseline  Pt has been wearing compression sleeve and glove a few hours/day most days, but is interested in getting new garments-09/06/17; issued new script for pt to get new garments when able-09/11/17    Status  Achieved            Plan - 09/22/17 1037    Clinical Impression Statement  Still with some limitation with manual stretches, but good flexibility seen with AA/ROM using pulleys.    Rehab Potential  Good    Clinical Impairments Affecting Rehab Potential  as above     PT Frequency  1x / week    PT Duration  4 weeks    PT Treatment/Interventions  ADLs/Self Care Home Management;Therapeutic activities;Therapeutic exercise;Patient/family education;Manual techniques;Manual lymph drainage;Scar mobilization;Passive range of motion;Taping;DME Instruction;Orthotic Fit/Training;Compression bandaging    PT Next Visit Plan  Focus on end ROM of Rt shoulder.     Consulted and Agree with Plan of Care  Patient       Patient will benefit from skilled therapeutic intervention in order to improve the following deficits and impairments:  Decreased range of motion, Decreased strength, Increased edema, Impaired UE functional use, Pain, Increased fascial restricitons, Postural dysfunction, Increased muscle spasms  Visit Diagnosis: Stiffness of right shoulder, not elsewhere classified  Right shoulder pain, unspecified chronicity  Abnormal posture     Problem List Patient Active Problem List   Diagnosis Date Noted  . Port catheter in place 12/28/2015  . Chemotherapy induced neutropenia ([place]) 05/14/2015  . 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  Tooth ache 01/09/2015  . Genetic testing 12/26/2014  . Family history of breast cancer   . Malignant neoplasm of lower-inner quadrant of right breast of female, estrogen receptor negative ([place]) 11/25/2014  . Migraine 11/19/2014    [place] 09/22/2017, 10:38 AM  [place], [place], [address] Phone: [phone]   Fax:  [phone]  Name: Susan Wilson MRN: [id number] Date of Birth: [birth year]  Serafina Royals, PT 09/22/17 10:39 AM

## 2017-09-25 ENCOUNTER — Ambulatory Visit: Payer: PRIVATE HEALTH INSURANCE

## 2017-09-25 DIAGNOSIS — M6281 Muscle weakness (generalized): Secondary | ICD-10-CM

## 2017-09-25 DIAGNOSIS — M25511 Pain in right shoulder: Secondary | ICD-10-CM

## 2017-09-25 DIAGNOSIS — R293 Abnormal posture: Secondary | ICD-10-CM

## 2017-09-25 DIAGNOSIS — M25611 Stiffness of right shoulder, not elsewhere classified: Secondary | ICD-10-CM

## 2017-09-25 NOTE — Therapy (Signed)
[place], [place], [address] Phone: [phone]   Fax:  [phone]  Physical Therapy Treatment  Patient Details  Name: Susan Wilson MRN: [id number] Date of Birth: 10/28/1975 Referring Provider: Dr Jana Hakim    Encounter Date: 09/25/2017  PT End of Session - 09/25/17 0925    Visit Number  11    Number of Visits  13    Date for PT Re-Evaluation  10/09/17    PT Start Time  0850    PT Stop Time  0933    PT Time Calculation (min)  43 min    Activity Tolerance  Patient tolerated treatment well    Behavior During Therapy  [facility] for tasks assessed/performed       Past Medical History:  Diagnosis Date  . Anemia   . Breast asymmetry 12/2015  . Breast cancer ([place]) 2017   right breast  . History of breast cancer 11/2014   right  . History of chemotherapy   . Migraines   . 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 Personal history of chemotherapy 2017  . Personal history of radiation therapy 2017    Past Surgical History:  Procedure Laterality Date  . BREAST LUMPECTOMY Right 2017  . BREAST RECONSTRUCTION Right 06/23/2015   Procedure: ONCOPLASTIC RIGHT BREAST REDUCTION;  Surgeon: Irene Limbo, MD;  Location: [place];  Service: Plastics;  Laterality: Right;  . BREAST REDUCTION SURGERY Left 06/23/2015   Procedure: MAMMARY REDUCTION  (BREAST) LEFT BREAST FOR SYMETRY (BILATERAL BREAST REDUCTION);  Surgeon: Irene Limbo, MD;  Location: [place];  Service: Plastics;  Laterality: Left;  . BREAST REDUCTION SURGERY Left 01/15/2016   Procedure: MAMMARY REDUCTION  (BREAST) LEFT;  Surgeon: Irene Limbo, MD;  Location: [place];  Service: Plastics;  Laterality: Left;  Marland Kitchen MASTOPEXY Left 01/15/2016   Procedure: POSSIBLE LEFT MASTOPEXY;  Surgeon: Irene Limbo, MD;  Location: [place];  Service: Plastics;   Laterality: Left;  . PORT-A-CATH REMOVAL Right 01/15/2016   Procedure: REMOVAL OF RIGHT PORT-A-CATH;  Surgeon: Irene Limbo, MD;  Location: [place];  Service: Plastics;  Laterality: Right;  . PORTACATH PLACEMENT Right 12/16/2014   Procedure: INSERTION PORT-A-CATH WITH ULTRASOUND;  Surgeon: Rolm Bookbinder, MD;  Location: [place];  Service: General;  Laterality: Right;  . RADIOACTIVE SEED GUIDED PARTIAL MASTECTOMY WITH AXILLARY SENTINEL LYMPH NODE BIOPSY Right 06/15/2015   Procedure: RADIOACTIVE SEED GUIDED PARTIAL MASTECTOMY WITH AXILLARY SENTINEL LYMPH NODE BIOPSY;  Surgeon: Rolm Bookbinder, MD;  Location: [place];  Service: General;  Laterality: Right;  . REDUCTION MAMMAPLASTY Bilateral 2017    There were no vitals filed for this visit.  Subjective Assessment - 09/25/17 0851    Subjective  It rained yesterday and some this morning so my Rt shoulder is a little flared up today.     Pertinent History  Right lumpectomy with 6 nodes removed end of January 2017, and had breast reduction on both sides, she had another reduction on the left side in September of 2017  Had chemotherapy before surgery and completed radiation October 19, 2015.  Has been seen here before for lymphedema and shoulder ROM.  Has compression day ( and glove)  and nighttime sleeve.  She does some taping at home when her she has more swelling and spasms ,  Still has a pulley at home that she uses, and Theraband (yellow)  Portacath was removed and it's still tender there.  Now she just does follow-up visits, but no further treatment. No other health issues.  Has neuropathy in both hands and feet, with reported numbness and tingling. Still has alot of numbness that is better in feet but sitll has problems with hands.     Patient Stated Goals  she would like to have less pain and help with swelling.  08/23/2017  pt wants to get back to riding her motorcycle.     Currently in Pain?  Yes    Pain Score  3      Pain Location  Shoulder    Pain Orientation  Right    Pain Descriptors / Indicators  Tightness    Pain Type  Chronic pain    Pain Onset  More than a month ago    Pain Frequency  Intermittent    Aggravating Factors   rainy or cold    Pain Relieving Factors  keeping it warm                       [facility] Adult PT Treatment/Exercise - 09/25/17 0001      Shoulder Exercises: Supine   Horizontal ABduction  AROM;Both;10 reps lying on towel roll    Other Supine Exercises  also over towel roll, active D2 bilat. x 10      Shoulder Exercises: Pulleys   Flexion  2 minutes therapist monitoring effectiveness of stretch    ABduction  2 minutes VCs to relax Rt shoulder      Shoulder Exercises: Therapy Ball   Flexion  Both;10 reps 2 lb on wrist; forward lean into end of stretch    ABduction  Right;5 reps 2 lbs and with same side lean into end of stretch      Shoulder Exercises: Stretch   Wall Stretch - ABduction  5 reps;10 seconds "snow angel"    Wall Stretch - ABduction Limitations  Pt return correct therapist demonstration      Manual Therapy   Manual Therapy  Myofascial release;Passive ROM    Myofascial Release  right UE myofascial pulling, then pulling with movement into abduction    Passive ROM  in supine to right shoulder er, abduction, and flexion; the into horizontal abduction and end range D2                  PT Long Term Goals - 09/11/17 0953      PT LONG TERM GOAL #1   Title  Pt will report the pain and discomfort in her right upper quadant is decreased by 50%    Baseline  Pt reports 30% improvement at this time-09/06/17; pt reports pain/discomfort feeling 50% improved over weekend-09/11/17    Status  Achieved      PT LONG TERM GOAL #2   Title  Pt will report she will be able to use the compression pump to help with her symptoms of lymphedema in her right upper quadrant     Baseline  Pt is waiting to hear back from compression pump company as they have  submitted paperwork to her insurance as of 09/06/17    Status  On-going      PT LONG TERM GOAL #3   Title  Pt will increase the range of motin of right shoulder extension to to 40 degrees     Baseline  50 degrees-09/11/17    Status  Achieved      PT LONG TERM GOAL #  4   Title  Pt will be independent in a strengthening program to right shoulder     Baseline  Progressed HEP to include standing 3 way raises, pt independent with supine scapular series-09/11/17    Status  Partially Met      PT LONG TERM GOAL #5   Title  Pt will have correctly fitting sleeve and glove to continue to manage her lymphedema at home     Baseline  Pt has been wearing compression sleeve and glove a few hours/day most days, but is interested in getting new garments-09/06/17; issued new script for pt to get new garments when able-09/11/17    Status  Achieved            Plan - 09/25/17 0925    Clinical Impression Statement  Some limitations still with end ROM and fascial tightness as pt reports tenderness and numbness at end ROM, flexion especially. Pt does report noticing overall her tightness has improved since start of therapy.    Rehab Potential  Good    Clinical Impairments Affecting Rehab Potential  as above     PT Frequency  1x / week    PT Duration  4 weeks    PT Treatment/Interventions  ADLs/Self Care Home Management;Therapeutic activities;Therapeutic exercise;Patient/family education;Manual techniques;Manual lymph drainage;Scar mobilization;Passive range of motion;Taping;DME Instruction;Orthotic Fit/Training;Compression bandaging    PT Next Visit Plan  Focus on end ROM of Rt shoulder.     Consulted and Agree with Plan of Care  Patient       Patient will benefit from skilled therapeutic intervention in order to improve the following deficits and impairments:  Decreased range of motion, Decreased strength, Increased edema, Impaired UE functional use, Pain, Increased fascial restricitons, Postural dysfunction,  Increased muscle spasms  Visit Diagnosis: Stiffness of right shoulder, not elsewhere classified  Right shoulder pain, unspecified chronicity  Abnormal posture  Muscle weakness (generalized)     Problem List Patient Active Problem List   Diagnosis Date Noted  . Port catheter in place 12/28/2015  . Chemotherapy induced neutropenia ([place]) 05/14/2015  . 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 Tooth ache 01/09/2015  . Genetic testing 12/26/2014  . Family history of breast cancer   . Malignant neoplasm of lower-inner quadrant of right breast of female, estrogen receptor negative ([place]) 11/25/2014  . Migraine 11/19/2014    Otelia Limes, PTA 09/25/2017, 9:33 AM  [place], [place], [address] Phone: [phone]   Fax:  [phone]  Name: Susan Wilson MRN: [id number] Date of Birth: 09-17-1975

## 2017-10-02 ENCOUNTER — Ambulatory Visit: Payer: PRIVATE HEALTH INSURANCE

## 2017-10-03 ENCOUNTER — Ambulatory Visit: Payer: PRIVATE HEALTH INSURANCE

## 2017-10-03 DIAGNOSIS — M25511 Pain in right shoulder: Secondary | ICD-10-CM

## 2017-10-03 DIAGNOSIS — R293 Abnormal posture: Secondary | ICD-10-CM

## 2017-10-03 DIAGNOSIS — M25611 Stiffness of right shoulder, not elsewhere classified: Secondary | ICD-10-CM

## 2017-10-03 NOTE — Therapy (Signed)
[place], [place], [address] Phone: [phone]   Fax:  [phone]  Physical Therapy Treatment  Patient Details  Name: Susan Wilson MRN: [id number] Date of Birth: [birth year] Referring Provider: Dr Jana Hakim    Encounter Date: 10/03/2017  PT End of Session - 10/03/17 1058    Visit Number  12    Number of Visits  13    Date for PT Re-Evaluation  10/09/17    PT Start Time  1017    PT Stop Time  1059    PT Time Calculation (min)  42 min    Activity Tolerance  Patient tolerated treatment well    Behavior During Therapy  [facility] for tasks assessed/performed       Past Medical History:  Diagnosis Date  . Anemia   . Breast asymmetry 12/2015  . Breast cancer ([place]) 2017   right breast  . History of breast cancer 11/2014   right  . History of chemotherapy   . Migraines   . 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 Personal history of chemotherapy 2017  . Personal history of radiation therapy 2017    Past Surgical History:  Procedure Laterality Date  . BREAST LUMPECTOMY Right 2017  . BREAST RECONSTRUCTION Right 06/23/2015   Procedure: ONCOPLASTIC RIGHT BREAST REDUCTION;  Surgeon: Irene Limbo, MD;  Location: [place];  Service: Plastics;  Laterality: Right;  . BREAST REDUCTION SURGERY Left 06/23/2015   Procedure: MAMMARY REDUCTION  (BREAST) LEFT BREAST FOR SYMETRY (BILATERAL BREAST REDUCTION);  Surgeon: Irene Limbo, MD;  Location: [place];  Service: Plastics;  Laterality: Left;  . BREAST REDUCTION SURGERY Left 01/15/2016   Procedure: MAMMARY REDUCTION  (BREAST) LEFT;  Surgeon: Irene Limbo, MD;  Location: [place];  Service: Plastics;  Laterality: Left;  Marland Kitchen MASTOPEXY Left 01/15/2016   Procedure: POSSIBLE LEFT MASTOPEXY;  Surgeon: Irene Limbo, MD;  Location: [place];  Service: Plastics;   Laterality: Left;  . PORT-A-CATH REMOVAL Right 01/15/2016   Procedure: REMOVAL OF RIGHT PORT-A-CATH;  Surgeon: Irene Limbo, MD;  Location: [place];  Service: Plastics;  Laterality: Right;  . PORTACATH PLACEMENT Right 12/16/2014   Procedure: INSERTION PORT-A-CATH WITH ULTRASOUND;  Surgeon: Rolm Bookbinder, MD;  Location: [place];  Service: General;  Laterality: Right;  . RADIOACTIVE SEED GUIDED PARTIAL MASTECTOMY WITH AXILLARY SENTINEL LYMPH NODE BIOPSY Right 06/15/2015   Procedure: RADIOACTIVE SEED GUIDED PARTIAL MASTECTOMY WITH AXILLARY SENTINEL LYMPH NODE BIOPSY;  Surgeon: Rolm Bookbinder, MD;  Location: [place];  Service: General;  Laterality: Right;  . REDUCTION MAMMAPLASTY Bilateral 2017    There were no vitals filed for this visit.  Subjective Assessment - 10/03/17 1021    Subjective  I'm feeling good today. My Rt shoulder and arm was spasming yesterday but it's better today.     Pertinent History  Right lumpectomy with 6 nodes removed end of January 2017, and had breast reduction on both sides, she had another reduction on the left side in September of 2017  Had chemotherapy before surgery and completed radiation October 19, 2015.  Has been seen here before for lymphedema and shoulder ROM.  Has compression day ( and glove)  and nighttime sleeve.  She does some taping at home when her she has more swelling and spasms ,  Still has a pulley at home that she uses, and Theraband (yellow)  Portacath  was removed and it's still tender there.  Now she just does follow-up visits, but no further treatment. No other health issues.  Has neuropathy in both hands and feet, with reported numbness and tingling. Still has alot of numbness that is better in feet but sitll has problems with hands.     Patient Stated Goals  she would like to have less pain and help with swelling.  08/23/2017  pt wants to get back to riding her motorcycle.     Currently in Pain?  No/denies                        [facility] Adult PT Treatment/Exercise - 10/03/17 0001      Manual Therapy   Manual Therapy  Myofascial release;Passive ROM    Myofascial Release  right UE myofascial pulling, then pulling with movement into abduction    Passive ROM  in supine to right shoulder er, abduction, flexion, and end range D2                  PT Long Term Goals - 09/11/17 0953      PT LONG TERM GOAL #1   Title  Pt will report the pain and discomfort in her right upper quadant is decreased by 50%    Baseline  Pt reports 30% improvement at this time-09/06/17; pt reports pain/discomfort feeling 50% improved over weekend-09/11/17    Status  Achieved      PT LONG TERM GOAL #2   Title  Pt will report she will be able to use the compression pump to help with her symptoms of lymphedema in her right upper quadrant     Baseline  Pt is waiting to hear back from compression pump company as they have submitted paperwork to her insurance as of 09/06/17    Status  On-going      PT LONG TERM GOAL #3   Title  Pt will increase the range of motin of right shoulder extension to to 40 degrees     Baseline  50 degrees-09/11/17    Status  Achieved      PT LONG TERM GOAL #4   Title  Pt will be independent in a strengthening program to right shoulder     Baseline  Progressed HEP to include standing 3 way raises, pt independent with supine scapular series-09/11/17    Status  Partially Met      PT LONG TERM GOAL #5   Title  Pt will have correctly fitting sleeve and glove to continue to manage her lymphedema at home     Baseline  Pt has been wearing compression sleeve and glove a few hours/day most days, but is interested in getting new garments-09/06/17; issued new script for pt to get new garments when able-09/11/17    Status  Achieved            Plan - 10/03/17 1059    Clinical Impression Statement  Pt still with limitations at end ROM and fascial tightness. This does seem to improve  some with stetching and pt reports feleing looser by end of session. She has one more visit next week and then will be ready for D/C.     Rehab Potential  Good    Clinical Impairments Affecting Rehab Potential  as above     PT Frequency  1x / week    PT Duration  4 weeks    PT Treatment/Interventions  ADLs/Self Care Home Management;Therapeutic  activities;Therapeutic exercise;Patient/family education;Manual techniques;Manual lymph drainage;Scar mobilization;Passive range of motion;Taping;DME Instruction;Orthotic Fit/Training;Compression bandaging    PT Next Visit Plan  Focus on end ROM of Rt shoulder. Assess goals and D/C next.     Consulted and Agree with Plan of Care  Patient       Patient will benefit from skilled therapeutic intervention in order to improve the following deficits and impairments:  Decreased range of motion, Decreased strength, Increased edema, Impaired UE functional use, Pain, Increased fascial restricitons, Postural dysfunction, Increased muscle spasms  Visit Diagnosis: Stiffness of right shoulder, not elsewhere classified  Right shoulder pain, unspecified chronicity  Abnormal posture     Problem List Patient Active Problem List   Diagnosis Date Noted  . Port catheter in place 12/28/2015  . Chemotherapy induced neutropenia ([place]) 05/14/2015  . Chemotherapy-induced neuropathy ([place]) 04/08/2015  . 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 Tooth ache 01/09/2015  . Genetic testing 12/26/2014  . Family history of breast cancer   . Malignant neoplasm of lower-inner quadrant of right breast of female, estrogen receptor negative ([place]) 11/25/2014  . Migraine 11/19/2014    Otelia Limes, PTA 10/03/2017, 11:01 AM  [place], [place], [address] Phone: [phone]   Fax:  [phone]  Name: DAESHA INSCO MRN: [id number] Date of Birth: 1976/03/15

## 2017-10-10 ENCOUNTER — Ambulatory Visit: Payer: Self-pay

## 2017-10-10 DIAGNOSIS — M6281 Muscle weakness (generalized): Secondary | ICD-10-CM

## 2017-10-10 DIAGNOSIS — M25511 Pain in right shoulder: Secondary | ICD-10-CM

## 2017-10-10 DIAGNOSIS — R293 Abnormal posture: Secondary | ICD-10-CM

## 2017-10-10 DIAGNOSIS — M25611 Stiffness of right shoulder, not elsewhere classified: Secondary | ICD-10-CM

## 2017-10-10 NOTE — Therapy (Addendum)
[place], [place], [address] Phone: [phone]   Fax:  [phone]  Physical Therapy Treatment  Patient Details  Name: Susan Wilson MRN: [id number] Date of Birth: [birth year] Referring Provider: Dr Jana Hakim    Encounter Date: 10/10/2017  PT End of Session - 10/10/17 0932    Visit Number  13    Number of Visits  13    Date for PT Re-Evaluation  10/09/17    PT Start Time  0850    PT Stop Time  0932    PT Time Calculation (min)  42 min    Activity Tolerance  Patient tolerated treatment well    Behavior During Therapy  [facility] for tasks assessed/performed       Past Medical History:  Diagnosis Date  . Anemia   . Breast asymmetry 12/2015  . Breast cancer ([place]) 2017   right breast  . History of breast cancer 11/2014   right  . History of chemotherapy   . Migraines   . 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 Personal history of chemotherapy 2017  . Personal history of radiation therapy 2017    Past Surgical History:  Procedure Laterality Date  . BREAST LUMPECTOMY Right 2017  . BREAST RECONSTRUCTION Right 06/23/2015   Procedure: ONCOPLASTIC RIGHT BREAST REDUCTION;  Surgeon: Irene Limbo, MD;  Location: [place];  Service: Plastics;  Laterality: Right;  . BREAST REDUCTION SURGERY Left 06/23/2015   Procedure: MAMMARY REDUCTION  (BREAST) LEFT BREAST FOR SYMETRY (BILATERAL BREAST REDUCTION);  Surgeon: Irene Limbo, MD;  Location: [place];  Service: Plastics;  Laterality: Left;  . BREAST REDUCTION SURGERY Left 01/15/2016   Procedure: MAMMARY REDUCTION  (BREAST) LEFT;  Surgeon: Irene Limbo, MD;  Location: [place];  Service: Plastics;  Laterality: Left;  Marland Kitchen MASTOPEXY Left 01/15/2016   Procedure: POSSIBLE LEFT MASTOPEXY;  Surgeon: Irene Limbo, MD;  Location: [place];  Service: Plastics;   Laterality: Left;  . PORT-A-CATH REMOVAL Right 01/15/2016   Procedure: REMOVAL OF RIGHT PORT-A-CATH;  Surgeon: Irene Limbo, MD;  Location: [place] [facility];  Service: Plastics;  Laterality: Right;  . PORTACATH PLACEMENT Right 12/16/2014   Procedure: INSERTION PORT-A-CATH WITH ULTRASOUND;  Surgeon: Rolm Bookbinder, MD;  Location: [facility];  Service: General;  Laterality: Right;  . RADIOACTIVE SEED GUIDED PARTIAL MASTECTOMY WITH AXILLARY SENTINEL LYMPH NODE BIOPSY Right 06/15/2015   Procedure: RADIOACTIVE SEED GUIDED PARTIAL MASTECTOMY WITH AXILLARY SENTINEL LYMPH NODE BIOPSY;  Surgeon: Rolm Bookbinder, MD;  Location: [facility];  Service: General;  Laterality: Right;  . REDUCTION MAMMAPLASTY Bilateral 2017    There were no vitals filed for this visit.  Subjective Assessment - 10/10/17 0854    Subjective  My Rt shoulder is doing okay, it just feels a little sore.     Pertinent History  Right lumpectomy with 6 nodes removed end of January 2017, and had breast reduction on both sides, she had another reduction on the left side in September of 2017  Had chemotherapy before surgery and completed radiation October 19, 2015.  Has been seen here before for lymphedema and shoulder ROM.  Has compression day ( and glove)  and nighttime sleeve.  She does some taping at home when her she has more swelling and spasms ,  Still has a pulley at home that she uses, and Theraband (yellow)  Portacath was removed and it's  still tender there.  Now she just does follow-up visits, but no further treatment. No other health issues.  Has neuropathy in both hands and feet, with reported numbness and tingling. Still has alot of numbness that is better in feet but sitll has problems with hands.     Patient Stated Goals  she would like to have less pain and help with swelling.  08/23/2017  pt wants to get back to riding her motorcycle.     Currently in Pain?  No/denies         [facility] PT Assessment - 10/10/17  0001      AROM   Right Shoulder Extension  48 Degrees    Right Shoulder Flexion  159 Degrees    Right Shoulder ABduction  168 Degrees pt reports feeling no tightness or tingling    Right Shoulder Internal Rotation  76 Degrees                   [facility] Adult PT Treatment/Exercise - 10/10/17 0001      Shoulder Exercises: Standing   Other Standing Exercises  3 way raises with 2 lbs into flexion, scaption, and abduction to shoulder height 10 times each with core engaged and shoulders/head against wall.       Shoulder Exercises: Therapy Ball   Flexion  Both;10 reps Forward lean into end of stretch    ABduction  Right;10 reps Same side lean into end of stretch      Manual Therapy   Manual Therapy  Myofascial release;Passive ROM    Myofascial Release  right UE myofascial pulling, then pulling with movement into abduction    Passive ROM  in supine to right shoulder er, abduction, flexion, and end range D2                  PT Long Term Goals - 10/10/17 0934      PT LONG TERM GOAL #1   Title  Pt will report the pain and discomfort in her right upper quadant is decreased by 50%    Baseline  Pt reports 30% improvement at this time-09/06/17; pt reports pain/discomfort feeling 50% improved over weekend-09/11/17    Status  Achieved      PT LONG TERM GOAL #2   Title  Pt will report she will be able to use the compression pump to help with her symptoms of lymphedema in her right upper quadrant     Baseline  Pt is waiting to hear back from compression pump company as they have submitted paperwork to her insurance as of 09/06/17; pt has yet to receive this due to still awaiting insurance approval but pump company will give her a demonstration and make sure she is independent with use-10/10/17    Status  Deferred      PT LONG TERM GOAL #3   Title  Pt will increase the range of motin of right shoulder extension to to 40 degrees     Baseline  50 degrees-09/11/17; 48 degrees 10/10/17     Status  Achieved      PT LONG TERM GOAL #4   Title  Pt will be independent in a strengthening program to right shoulder     Baseline  Progressed HEP to include standing 3 way raises, pt independent with supine scapular series-09/11/17; pt now independent with all-10/10/17    Status  Achieved      PT LONG TERM GOAL #5   Title  Pt will have correctly fitting  sleeve and glove to continue to manage her lymphedema at home     Baseline  Pt has been wearing compression sleeve and glove a few hours/day most days, but is interested in getting new garments-09/06/17; issued new script for pt to get new garments when able-09/11/17    Status  Achieved            Plan - 10/10/17 0932    Clinical Impression Statement  Pt had full ROM with P/ROM stretching today and reported feeling very little to no tightness. She has met all goals (except does not have pump yet but process has started and they are just awaiting approval from her insurance). Pt is ready for D/C at this time.     Rehab Potential  Good    Clinical Impairments Affecting Rehab Potential  as above     PT Frequency  1x / week    PT Duration  4 weeks    PT Treatment/Interventions  ADLs/Self Care Home Management;Therapeutic activities;Therapeutic exercise;Patient/family education;Manual techniques;Manual lymph drainage;Scar mobilization;Passive range of motion;Taping;DME Instruction;Orthotic Fit/Training;Compression bandaging    PT Next Visit Plan  D/C this visit.     Consulted and Agree with Plan of Care  Patient       Patient will benefit from skilled therapeutic intervention in order to improve the following deficits and impairments:  Decreased range of motion, Decreased strength, Increased edema, Impaired UE functional use, Pain, Increased fascial restricitons, Postural dysfunction, Increased muscle spasms  Visit Diagnosis: Stiffness of right shoulder, not elsewhere classified  Right shoulder pain, unspecified chronicity  Abnormal  posture  Muscle weakness (generalized)     Problem List Patient Active Problem List   Diagnosis Date Noted  . Port catheter in place 12/28/2015  . Chemotherapy induced neutropenia ([place]) 05/14/2015  . Chemotherapy-induced neuropathy ([place]) 04/08/2015  . 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 Tooth ache 01/09/2015  . Genetic testing 12/26/2014  . Family history of breast cancer   . Malignant neoplasm of lower-inner quadrant of right breast of female, estrogen receptor negative ([place]) 11/25/2014  . Migraine 11/19/2014    Otelia Limes, PTA 10/10/2017, 9:38 AM  [place], [place], [address] Phone: [phone]   Fax:  [phone]  Name: Susan Wilson MRN: [id number] Date of Birth: Jan 01, 1976  PHYSICAL THERAPY DISCHARGE SUMMARY  Visits from Start of Care: 13  Current functional level related to goals / functional outcomes: Goals met as noted above.   Remaining deficits: Doing well.   Education / Equipment: Home exercise program, self-management of swelling. Plan: Patient agrees to discharge.  Patient goals were met. Patient is being discharged due to meeting the stated rehab goals.  ?????   Serafina Royals, PT 10/23/17 4:42 PM

## 2017-10-13 ENCOUNTER — Other Ambulatory Visit: Payer: Self-pay | Admitting: Adult Health

## 2017-10-13 DIAGNOSIS — C50311 Malignant neoplasm of lower-inner quadrant of right female breast: Secondary | ICD-10-CM

## 2017-10-13 MED ORDER — SUMATRIPTAN SUCCINATE 50 MG PO TABS: 50.0000 mg | ORAL_TABLET | ORAL | 0 refills | Status: DC | PRN

## 2017-10-13 MED ORDER — MELOXICAM 15 MG PO TABS
15.0000 mg | ORAL_TABLET | Freq: Every day | ORAL | 5 refills | Status: DC
Start: 2017-10-13 — End: 2018-11-26

## 2017-11-21 ENCOUNTER — Encounter (INDEPENDENT_AMBULATORY_CARE_PROVIDER_SITE_OTHER): Payer: Self-pay

## 2017-12-28 ENCOUNTER — Telehealth: Payer: Self-pay | Admitting: Physical Therapy

## 2017-12-28 NOTE — Telephone Encounter (Signed)
Received a message form [employer] Sunnie Nielsen [phone] that they have not been able to contact patient.  They have left several messages with her.  I called pt and, since she did not answer, left a message for her to call Lelan Pons to discuss getting her compression pump. Maudry Diego, PT @TODAY @ 11:27 AM

## 2018-01-01 ENCOUNTER — Ambulatory Visit: Payer: Self-pay | Admitting: Family Medicine

## 2018-01-16 ENCOUNTER — Other Ambulatory Visit: Payer: Self-pay | Admitting: Oncology

## 2018-01-16 DIAGNOSIS — Z853 Personal history of malignant neoplasm of breast: Secondary | ICD-10-CM

## 2018-01-25 ENCOUNTER — Telehealth: Payer: Self-pay | Admitting: Oncology

## 2018-01-25 NOTE — Telephone Encounter (Signed)
GM PAL 9/19 - moved appointment from [facility] to [facility] per GM. Left message scheduled mailed.

## 2018-02-01 ENCOUNTER — Inpatient Hospital Stay: Payer: Medicare Other

## 2018-02-01 ENCOUNTER — Inpatient Hospital Stay: Payer: Medicare Other | Attending: Oncology | Admitting: Adult Health

## 2018-02-01 ENCOUNTER — Encounter: Payer: Self-pay | Admitting: Adult Health

## 2018-02-01 VITALS — BP 135/92 | HR 79 | Temp 99.4°F | Resp 18 | Ht 65.0 in | Wt 222.0 lb

## 2018-02-01 DIAGNOSIS — Z853 Personal history of malignant neoplasm of breast: Secondary | ICD-10-CM | POA: Diagnosis not present

## 2018-02-01 DIAGNOSIS — G620 Drug-induced polyneuropathy: Secondary | ICD-10-CM | POA: Diagnosis not present

## 2018-02-01 DIAGNOSIS — Z171 Estrogen receptor negative status [ER-]: Principal | ICD-10-CM

## 2018-02-01 DIAGNOSIS — I890 Lymphedema, not elsewhere classified: Secondary | ICD-10-CM | POA: Insufficient documentation

## 2018-02-01 DIAGNOSIS — T451X5A Adverse effect of antineoplastic and immunosuppressive drugs, initial encounter: Secondary | ICD-10-CM

## 2018-02-01 DIAGNOSIS — C50311 Malignant neoplasm of lower-inner quadrant of right female breast: Secondary | ICD-10-CM

## 2018-02-01 LAB — CBC WITH DIFFERENTIAL/PLATELET
Basophils Absolute: 0 10*3/uL (ref 0.0–0.1)
Basophils Relative: 1 %
EOS ABS: 0 10*3/uL (ref 0.0–0.5)
Eosinophils Relative: 1 %
HEMATOCRIT: 37.6 % (ref 34.8–46.6)
HEMOGLOBIN: 11.8 g/dL (ref 11.6–15.9)
LYMPHS ABS: 2.2 10*3/uL (ref 0.9–3.3)
Lymphocytes Relative: 37 %
MCH: 25.3 pg (ref 25.1–34.0)
MCHC: 31.5 g/dL (ref 31.5–36.0)
MCV: 80.2 fL (ref 79.5–101.0)
MONOS PCT: 7 %
Monocytes Absolute: 0.4 10*3/uL (ref 0.1–0.9)
NEUTROS ABS: 3.2 10*3/uL (ref 1.5–6.5)
NEUTROS PCT: 54 %
Platelets: 263 10*3/uL (ref 145–400)
RBC: 4.68 MIL/uL (ref 3.70–5.45)
RDW: 14.5 % (ref 11.2–14.5)
WBC: 5.9 10*3/uL (ref 3.9–10.3)

## 2018-02-01 LAB — COMPREHENSIVE METABOLIC PANEL
ALT: 11 U/L (ref 0–44)
AST: 11 U/L — ABNORMAL LOW (ref 15–41)
Albumin: 4.1 g/dL (ref 3.5–5.0)
Alkaline Phosphatase: 74 U/L (ref 38–126)
Anion gap: 6 (ref 5–15)
BUN: 11 mg/dL (ref 6–20)
CHLORIDE: 103 mmol/L (ref 98–111)
CO2: 27 mmol/L (ref 22–32)
CREATININE: 0.77 mg/dL (ref 0.44–1.00)
Calcium: 9.3 mg/dL (ref 8.9–10.3)
GFR calc non Af Amer: >60 mL/min (ref >60)
Glucose, Bld: 87 mg/dL (ref 70–99)
Potassium: 4.2 mmol/L (ref 3.5–5.1)
SODIUM: 136 mmol/L (ref 135–145)
Total Bilirubin: 0.8 mg/dL (ref 0.3–1.2)
Total Protein: 7.8 g/dL (ref 6.5–8.1)

## 2018-02-01 MED ORDER — ONDANSETRON HCL 4 MG PO TABS: 4.0000 mg | ORAL_TABLET | Freq: Three times a day (TID) | ORAL | 0 refills | Status: DC | PRN

## 2018-02-01 NOTE — Progress Notes (Signed)
[facility]  [phone] [phone]     ID: Susan Wilson DOB: Jul 14, 1975  MR#: [id number]  YPP#:[id number]  Patient Care Team: Donnamae Jude, MD as PCP - General (Obstetrics and Gynecology) Rolm Bookbinder, MD as Consulting Physician (General Surgery) Magrinat, Virgie Dad, MD as Consulting Physician (Oncology) [name], RN as Registered Nurse Rockwell Germany, RN as Registered Nurse Alda Berthold, DO as Consulting Physician (Neurology) PCP: Donnamae Jude, MD OTHER MD:  CHIEF COMPLAINT: Triple negative breast cancer  CURRENT TREATMENT: Observation  BREAST CANCER HISTORY: From the original intake note:  [name] herself noted a mass in her right breast sometime around April. Initially she thought it might be related to menstruation, but as it did not change and eventually became tender, she brought it to her physician's attention. On 11/20/2014 patient underwent bilateral diagnostic mammography with tomosynthesis and right breast ultrasonography at the breast Center. The breast density was category B. There was a hyperdense mass in the right lower inner quadrant associated with skin thickening. There was also a 5 mm nodule posteriorly at the 8:30 o'clock position in the right breast. There were several hyperdense nodules in the right axilla. On physical exam there was a firm fixed mass in the right breast at the 5:00 position. By ultrasound this was lobulated and appear to involve the skin. It measures up to 4.1 cm. There was no sonographic correlation to the 5 mm nodule seen in a different area of the right breast. The right axilla showed 3 hypervascular lymph nodes with prominent cortical thickening, measuring less than 1.5 cm.  On 11/21/2014 the patient underwent right breast biopsy (5:00 mass) and biopsy of one of the suspicious right axillary lymph nodes. The pathology ([id number]) showed the breast biopsy to consist of invasive  ductal carcinoma, grade 2, estrogen receptor and progesterone receptor negative, with an MIB-1 of 20%, and HER-2 equivocal, with the signals ratio of 1.41, but the average copy number per cell 4.35.  The patient's subsequent history is as detailed below  INTERVAL HISTORY:  [name] returns today for follow-up of her triple negative breast cancer. She is doing well overall.  Since her last visit to the office, she underwent diagnostic bilateral mammography with tomography at the Breast center on 02/15/2017 that showed: Breast density category B. There was no mammographic evidence for malignancy.  Her next mammogram is scheduled on 02/16/2018.    REVIEW OF SYSTEMS: Elverta notes that people around her have been getting sick.  She notes that she is increasing her vitamins to help boost her immune system.  She has continued intermittent right sided breast pain.  She does apply vitamin e oil to her surgical scars.  She notes intermittent irritation after wearing bras for prolonged periods of time.  She notes intermittent neuropathy.  The painful neuropathy in her feet is much improved Nortiptyline.    Thedora notes continued right arm swelling where she has longstanding lymphedema.  She did return for physical therapy earlier this year, and underwent several treatments, replaced her sleeve, and she is working on getting a machine to help her at home.    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  She has migraines normally, however they have increased in frequency and intensity in the office.    PAST MEDICAL HISTORY: Past Medical History:  Diagnosis Date  . Anemia   . Breast asymmetry 12/2015  . Breast cancer ([place]) 2017  right breast  . History of breast cancer 11/2014   right  . History of chemotherapy   . Migraines   . 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 Personal history of chemotherapy 2017  . Personal history of  radiation therapy 2017    PAST SURGICAL HISTORY: Past Surgical History:  Procedure Laterality Date  . BREAST LUMPECTOMY Right 2017  . BREAST RECONSTRUCTION Right 06/23/2015   Procedure: ONCOPLASTIC RIGHT BREAST REDUCTION;  Surgeon: Irene Limbo, MD;  Location: [facility];  Service: Plastics;  Laterality: Right;  . BREAST REDUCTION SURGERY Left 06/23/2015   Procedure: MAMMARY REDUCTION  (BREAST) LEFT BREAST FOR SYMETRY (BILATERAL BREAST REDUCTION);  Surgeon: Irene Limbo, MD;  Location: [place];  Service: Plastics;  Laterality: Left;  . BREAST REDUCTION SURGERY Left 01/15/2016   Procedure: MAMMARY REDUCTION  (BREAST) LEFT;  Surgeon: Irene Limbo, MD;  Location: [place];  Service: Plastics;  Laterality: Left;  Marland Kitchen MASTOPEXY Left 01/15/2016   Procedure: POSSIBLE LEFT MASTOPEXY;  Surgeon: Irene Limbo, MD;  Location: [place];  Service: Plastics;  Laterality: Left;  . PORT-A-CATH REMOVAL Right 01/15/2016   Procedure: REMOVAL OF RIGHT PORT-A-CATH;  Surgeon: Irene Limbo, MD;  Location: [place];  Service: Plastics;  Laterality: Right;  . PORTACATH PLACEMENT Right 12/16/2014   Procedure: INSERTION PORT-A-CATH WITH ULTRASOUND;  Surgeon: Rolm Bookbinder, MD;  Location: [facility];  Service: General;  Laterality: Right;  . RADIOACTIVE SEED GUIDED PARTIAL MASTECTOMY WITH AXILLARY SENTINEL LYMPH NODE BIOPSY Right 06/15/2015   Procedure: RADIOACTIVE SEED GUIDED PARTIAL MASTECTOMY WITH AXILLARY SENTINEL LYMPH NODE BIOPSY;  Surgeon: Rolm Bookbinder, MD;  Location: [facility];  Service: General;  Laterality: Right;  . REDUCTION MAMMAPLASTY Bilateral 2017    FAMILY HISTORY Family History  Problem Relation Age of Onset  . Mesothelioma Maternal Grandfather        asbestos exposure, died in his 71s  . Heart defect Sister 0  . Breast cancer Paternal Grandmother   . Diabetes Paternal Grandmother   .  Cancer Paternal Grandfather        NOS  . Colon cancer Other 58       MGMs brother with colon cancer  . Cancer Other        several of MGF's sisters with cancer NOS  . Liver cancer Other        MGF's brother  . Heart disease Paternal Aunt   . Heart disease Paternal Uncle    the patient's parents are living, her father being 57 and her mother 44 as of July 2016. The patient had no brothers. One sister died at age 25 from cardiac problems. The other sister is in good health. On the maternal side there is a history of colon cancer and an uncle age 43, liver cancer in a great uncle and mesothelioma in the maternal grandfather.  GYNECOLOGIC HISTORY:  No LMP recorded. Menarche age 30. The patient is GX P2. Her periods were interrupted with chemotherapy, but have resumed (August 2018), although now very scant and brief  SOCIAL HISTORY:  Maye works in Therapist, art for [employer]. Her husband Antrell works for [employer]. The daughters are [place] and Lovie Macadamia, age 19 and 2. The patient attends a local [place]: Not in place   HEALTH MAINTENANCE: Social History   Tobacco Use  . Smoking status: Never Smoker  . Smokeless tobacco: Never  Used  Substance Use Topics  . Alcohol use: Yes    Comment: rarely  . Drug use: No     Colonoscopy:  PAP: 2014  Bone density:  Lipid panel:  No Known Allergies  Current Outpatient Medications  Medication Sig Dispense Refill  . b complex vitamins tablet Take 1 tablet by mouth daily.    . ferrous sulfate 325 (65 FE) MG tablet Take 325 mg by mouth daily with breakfast.    . LORazepam (ATIVAN) 0.5 MG tablet Take 1 tablet (0.5 mg total) by mouth at bedtime. 30 tablet 0  . meloxicam (MOBIC) 15 MG tablet Take 1 tablet (15 mg total) by mouth daily. 60 tablet 5  . nortriptyline (PAMELOR) 10 MG capsule Start nortriptyline 108m at bedtime for 2 week, then increase to 2 tablet at bedtime 60 capsule 5  .  ondansetron (ZOFRAN) 4 MG tablet Take 4 mg by mouth every 8 (eight) hours as needed for nausea or vomiting.    . SUMAtriptan (IMITREX) 50 MG tablet Take 1 tablet (50 mg total) by mouth every 2 (two) hours as needed for migraine. Mayrepeat in 2 hrs if needed. 10 tablet 0  . vitamin E (VITAMIN E) 400 UNIT capsule Take 1 capsule (400 Units total) by mouth daily. 30 capsule 0   No current facility-administered medications for this visit.     OBJECTIVE:  Vitals:   02/01/18 1315  BP: (!) 135/92  Pulse: 79  Resp: 18  Temp: 99.4 F (37.4 C)  SpO2: 100%     Body mass index is 36.94 kg/m.    ECOG FS:1 - Symptomatic but completely ambulatory GENERAL: Patient is a well appearing female in no acute distress HEENT:  Sclerae anicteric.  Oropharynx clear and moist. No ulcerations or evidence of oropharyngeal candidiasis. Neck is supple.  NODES:  No cervical, supraclavicular, or axillary lymphadenopathy palpated.  BREAST EXAM: right breast s/p lumpectomy and reduction, mild amt of scar tissue present, stable per patient, no nodules, masses, left breast without nodules, masses, skin/nipple changes, s/p reduction LUNGS:  Clear to auscultation bilaterally.  No wheezes or rhonchi. HEART:  Regular rate and rhythm. No murmur appreciated. ABDOMEN:  Soft, nontender.  Positive, normoactive bowel sounds. No organomegaly palpated. MSK:  No focal spinal tenderness to palpation. Full range of motion bilaterally in the upper extremities. EXTREMITIES:  Right arm with mild swelling, other extremities without edema SKIN:  Clear with no obvious rashes or skin changes. No nail dyscrasia. NEURO:  Nonfocal. Well oriented.  Appropriate affect.    LAB RESULTS:  CMP  CBC Latest Ref Rng & Units 02/01/2018 12/26/2016 04/18/2016  WBC 3.9 - 10.3 K/uL 5.9 5.4 3.6(L)  Hemoglobin 11.6 - 15.9 g/dL 11.8 12.4 12.2  Hematocrit 34.8 - 46.6 % 37.6 38.4 38.3  Platelets 145 - 400 K/uL 263 245 260   CMP Latest Ref Rng & Units  02/01/2018 12/26/2016 04/18/2016  Glucose 70 - 99 mg/dL 87 102 102  BUN 6 - 20 mg/dL 11 10.4 12.4  Creatinine 0.44 - 1.00 mg/dL 0.77 0.8 0.8  Sodium 135 - 145 mmol/L 136 138 137  Potassium 3.5 - 5.1 mmol/L 4.2 4.2 4.3  Chloride 98 - 111 mmol/L 103 - -  CO2 22 - 32 mmol/L _0 Calcium 8.9 - 10.3 mg/dL 9.3 9.6 9.3  Total Protein 6.5 - 8.1 g/dL 7.8 7.4 7.6  Total Bilirubin 0.3 - 1.2 mg/dL 0.8 0.82 0.61  Alkaline Phos 38 - 126 U/L 74 74 96  AST 15 - 41 U/L 11(L) 16 18  ALT 0 - 44 U/L _0 Urinalysis    Component Value Date/Time   LABSPEC 1.020 05/06/2009 1439   PHURINE 7.0 05/06/2009 1439   GLUCOSEU NEGATIVE 05/06/2009 1439   HGBUR NEGATIVE 05/06/2009 1439   BILIRUBINUR NEGATIVE 05/06/2009 1439   KETONESUR NEGATIVE 05/06/2009 1439   PROTEINUR NEGATIVE 05/06/2009 1439   UROBILINOGEN 0.2 05/06/2009 1439   NITRITE NEGATIVE 05/06/2009 1439   LEUKOCYTESUR  05/06/2009 1439    NEGATIVE Biochemical Testing Only. Please order routine urinalysis from main lab if confirmatory testing is needed.    STUDIES: Since her last visit to the office, she underwent diagnostic bilateral mammography with tomography at the Breast center on 02/15/2017 that showed: Breast density category B. There was no mammographic evidence for malignancy.   ASSESSMENT: 42 y.o. BRCA negative [place] woman s/p Right breast biopsy lower inner quadrant 11/21/2014 for a clinical T2 NX, stage 2 invasive ductal carcinoma, grade 2 or 3, estrogen and progesterone receptor negative, HER-2 equivocal, with an Mib-1 of 20%  (a) biopsy of a suspicious axillary lymph node same day was negative, but discordant  (b) review of 80 additional tumor cells by FISH still showed HER-2 not amplified; tumor should be treated as a triple negative  (1) neoadjuvant chemotherapy started 01/01/2015 consisting of cyclophosphamide and doxorubicin in dose dense fashion 4, completed 02/13/2015, followed by paclitaxel/ carboplatin weekly  12 started 02/26/2015, completed 05/14/2015.   (2) genetics testing 12/15/2014 through the Breast/Ovarian gene panel offered by [facility] found no deleterious mutations in ATM, BARD1, BRCA1, BRCA2, BRIP1, CDH1, CHEK2, EPCAM, FANCC, MLH1, MSH2, MSH6, NBN, PALB2, PMS2, PTEN, RAD51C, RAD51D, TP53, and XRCC2  (3) right lumpectomy and sentinel lymph node sampling 06/15/2015 showed a residual pT1b pN0 invasive ductal carcinoma, grade 1, with negative margins. (There were actually a few microscopic nests of residual tumor and 0.9 is the longest distance between 2 of the microscopic nests--there was not enough tissue for repeat prognostic panel.)  (a) bilateral reduction mammoplasty 06/23/2015 showed no malignancy in either breast  (4) adjuvant radiation 09/01/2015-10/19/2015:  Right breast / 45 Gray @ 1.8 Pearline Cables per fraction x 25 fractions Right breast boost / 16 Gray at [employer] per fraction x 8 fractions    PLAN:   Susan Wilson is doing well today.  She is without any clinical or radiographic sign of recurrence.  Her chemotherapy induced peripheral neuropathy is improved with the Nortriptyline. She will continue to work with PT in getting her supplies for her lymphedema.  I recommended she continue to wear her sleeve.  We reviewed the increase in migraines since returning to a work setting.  I recommended that if they change in presentation or character that she f/u with either [place] or neurology for evaluation.    Latrisa and I reviewed health maintenance which includes keeping up with her annual mammograms, healthy diet, exercise, pap smears.  I did review that the recommendations for colon cancer screening are changing to 43 years old.    Alvine will return in about 6 months for labs and f/u with Dr. Jana Hakim.  She knows to call for any questions or concerns prior to that visit.     A total of (30) minutes of face-to-face time was spent with this patient with greater than 50% of that time in counseling and  care-coordination.  Wilber Bihari, NP  02/01/18 1:46 PM Medical Oncology and Hematology [place] [address]  [place], [place] [address] Tel. [phone]    Fax. [phone]

## 2018-02-02 ENCOUNTER — Telehealth: Payer: Self-pay | Admitting: Adult Health

## 2018-02-02 NOTE — Telephone Encounter (Signed)
Per 9/19 los, needs appt for 6 months.  Patient already scheduled for Feb.  Called patient to possibly reschedule to March.  Left message for patient to call us back to reschedule to March.

## 2018-02-05 ENCOUNTER — Ambulatory Visit: Payer: Self-pay | Admitting: Family Medicine

## 2018-02-14 ENCOUNTER — Other Ambulatory Visit: Payer: Self-pay

## 2018-02-14 ENCOUNTER — Other Ambulatory Visit: Payer: Self-pay | Admitting: Oncology

## 2018-02-14 DIAGNOSIS — Z853 Personal history of malignant neoplasm of breast: Secondary | ICD-10-CM

## 2018-02-15 ENCOUNTER — Other Ambulatory Visit: Payer: Self-pay | Admitting: Adult Health

## 2018-02-15 DIAGNOSIS — Z853 Personal history of malignant neoplasm of breast: Secondary | ICD-10-CM

## 2018-02-16 ENCOUNTER — Ambulatory Visit
Admission: RE | Admit: 2018-02-16 | Discharge: 2018-02-16 | Disposition: A | Discharge status: Home / Self Care | Payer: Medicare Other | Source: Ambulatory Visit | Attending: Oncology | Admitting: Oncology

## 2018-02-16 DIAGNOSIS — R928 Other abnormal and inconclusive findings on diagnostic imaging of breast: Secondary | ICD-10-CM | POA: Diagnosis not present

## 2018-02-16 DIAGNOSIS — Z853 Personal history of malignant neoplasm of breast: Secondary | ICD-10-CM | POA: Diagnosis not present

## 2018-02-27 ENCOUNTER — Ambulatory Visit (INDEPENDENT_AMBULATORY_CARE_PROVIDER_SITE_OTHER): Payer: Medicare Other | Admitting: Advanced Practice Midwife

## 2018-02-27 ENCOUNTER — Encounter: Payer: Self-pay | Admitting: Advanced Practice Midwife

## 2018-02-27 ENCOUNTER — Ambulatory Visit: Payer: Self-pay | Admitting: Advanced Practice Midwife

## 2018-02-27 VITALS — BP 143/82 | HR 90 | Ht 65.0 in | Wt 223.4 lb

## 2018-02-27 DIAGNOSIS — Z171 Estrogen receptor negative status [ER-]: Secondary | ICD-10-CM | POA: Diagnosis not present

## 2018-02-27 DIAGNOSIS — Z3009 Encounter for other general counseling and advice on contraception: Secondary | ICD-10-CM

## 2018-02-27 DIAGNOSIS — C50311 Malignant neoplasm of lower-inner quadrant of right female breast: Secondary | ICD-10-CM | POA: Diagnosis not present

## 2018-02-27 LAB — POCT PREGNANCY, URINE: PREG TEST UR: NEGATIVE

## 2018-02-27 NOTE — Progress Notes (Signed)
Had unprotected intercourse about a week ago. States had negative pregnancy blood test week ago.

## 2018-02-27 NOTE — Progress Notes (Signed)
Subjective:     Susan Wilson is a 42 y.o. female here for IUD placement. She is within 3 years of treatment for active breast cancer and is managed by oncology.  Her oncologist sent her to Ob/Gyn for placement of levonorgestrel IUD. She reports her cancer was triple negative and was not related to hormones per her doctor.  She reports last unprotected intercourse 5-7 days ago.  Current complaints: none.  She has regular menses but they are spotting only.    Gynecologic History No LMP recorded. Contraception: rhythm method Last mammogram: Pt actively managed by oncology for breast cancer with most recent mammogram 02/16/18.  Results were: normal  Obstetric History OB History  Gravida Para Term Preterm AB Living  2 2 2         SAB TAB Ectopic Multiple Live Births               # Outcome Date GA Lbr Len/2nd Weight Sex Delivery Anes PTL Lv  2 Term      Vag-Spont     1 Term      Vag-Spont        The following portions of the patient's history were reviewed and updated as appropriate: allergies, current medications, past family history, past medical history, past social history, past surgical history and problem list.  Review of Systems Pertinent items noted in HPI and remainder of comprehensive ROS otherwise negative.    Objective:   BP (!) 143/82   Pulse 90   Ht 5' 5"  (1.651 m)   Wt 101.3 kg   LMP 02/16/2018   BMI 37.18 kg/m    VS reviewed, nursing note reviewed,  Constitutional: well developed, well nourished, no distress HEENT: normocephalic CV: normal rate Pulm/chest wall: normal effort Abdomen: soft Neuro: alert and oriented x 3 Skin: warm, dry Psych: affect normal  Assessment/Plan:  1. Malignant neoplasm of lower-inner quadrant of right breast of female, estrogen receptor negative ([place]) --Pt in active treatment currently.   --Per [place] Medical Eligibility Criteria for Contraceptive Use, current within 3 years of diagnosis tx for Breast cancer makes ALL hormonal,  including IUD, Category 4. --Consult Dr Harolyn Rutherford. Need more information about cancer type/safety.   --Pt needs to wait 2 weeks because of recent unprotected intercourse so plan to reschedule with attending in 2 weeks and review medical chart/oncology. --Consider Paragard vs Liletta based on oncology recommendations/safety.  2. Encounter for counseling regarding contraception --See above  Fatima Blank, CNM 6:44 PM

## 2018-03-12 ENCOUNTER — Ambulatory Visit: Payer: Medicare Other | Admitting: Obstetrics & Gynecology

## 2018-03-28 ENCOUNTER — Ambulatory Visit: Payer: Medicare Other | Admitting: Obstetrics & Gynecology

## 2018-03-28 ENCOUNTER — Encounter: Payer: Self-pay | Admitting: Obstetrics & Gynecology

## 2018-03-28 ENCOUNTER — Telehealth: Payer: Self-pay | Admitting: Family Medicine

## 2018-03-28 VITALS — BP 136/94 | HR 79 | Ht 65.0 in | Wt 229.3 lb

## 2018-03-28 DIAGNOSIS — Z3009 Encounter for other general counseling and advice on contraception: Secondary | ICD-10-CM

## 2018-03-28 DIAGNOSIS — C50919 Malignant neoplasm of unspecified site of unspecified female breast: Secondary | ICD-10-CM

## 2018-03-28 LAB — POCT PREGNANCY, URINE: PREG TEST UR: NEGATIVE

## 2018-03-28 MED ORDER — LEVONORGESTREL 19.5 MCG/DAY IU IUD: INTRAUTERINE_SYSTEM | Freq: Once | INTRAUTERINE | Status: DC

## 2018-03-28 NOTE — Telephone Encounter (Signed)
LVM stating that the [facility] paperwork she received was the incorrect info and needed her to come back to the office to receive the correct papers.

## 2018-03-28 NOTE — Progress Notes (Signed)
    Patient was here today for IUD placement. Unfortunately, her insurance (Medicare) does not cover IUDs. Application for free Mirena/Liletta given to patient. She will return as soon as we get the free IUD for her, recommended condoms for contraception. Will also get pap smear during her IUD insertion.   Verita Schneiders, MD, [place] for [employer], [place]

## 2018-04-04 DIAGNOSIS — C50311 Malignant neoplasm of lower-inner quadrant of right female breast: Secondary | ICD-10-CM | POA: Diagnosis not present

## 2018-05-05 ENCOUNTER — Other Ambulatory Visit: Payer: Self-pay | Admitting: Adult Health

## 2018-05-18 ENCOUNTER — Other Ambulatory Visit: Payer: Self-pay

## 2018-05-18 MED ORDER — ONDANSETRON HCL 4 MG PO TABS: ORAL_TABLET | ORAL | 0 refills | Status: DC

## 2018-07-03 NOTE — Progress Notes (Signed)
[name]  [phone] [phone]) [phone]     ID: Susan Wilson DOB: Jan 30, 1976  MR#: [id number]  JYN#:[id number]  Patient Care Team: Patient, No Pcp Per as PCP - General (General Practice) Rolm Bookbinder, MD as Consulting Physician (General Surgery) Kyrstan Gotwalt, Virgie Dad, MD as Consulting Physician (Oncology) [name], RN as Registered Nurse Rockwell Germany, RN as Registered Nurse Alda Berthold, DO as Consulting Physician (Neurology) PCP: Patient, No Pcp Per OTHER MD:   CHIEF COMPLAINT: Triple negative breast cancer  CURRENT TREATMENT: Observation   BREAST CANCER HISTORY: From the original intake note:  Susan Wilson herself noted a mass in her right breast sometime around April. Initially she thought it might be related to menstruation, but as it did not change and eventually became tender, she brought it to her physician's attention. On 11/20/2014 patient underwent bilateral diagnostic mammography with tomosynthesis and right breast ultrasonography at the breast Center. The breast density was category B. There was a hyperdense mass in the right lower inner quadrant associated with skin thickening. There was also a 5 mm nodule posteriorly at the 8:30 o'clock position in the right breast. There were several hyperdense nodules in the right axilla. On physical exam there was a firm fixed mass in the right breast at the 5:00 position. By ultrasound this was lobulated and appear to involve the skin. It measures up to 4.1 cm. There was no sonographic correlation to the 5 mm nodule seen in a different area of the right breast. The right axilla showed 3 hypervascular lymph nodes with prominent cortical thickening, measuring less than 1.5 cm.  On 11/21/2014 the patient underwent right breast biopsy (5:00 mass) and biopsy of one of the suspicious right axillary lymph nodes. The pathology (SAA [id number]) showed the breast biopsy to consist of invasive ductal  carcinoma, grade 2, estrogen receptor and progesterone receptor negative, with an MIB-1 of 20%, and HER-2 equivocal, with the signals ratio of 1.41, but the average copy number per cell 4.35.  The patient's subsequent history is as detailed below   INTERVAL HISTORY:  Ande returns today for follow-up and treatment of her triple negative breast cancer.   She continues under observation.   Since her last visit here, she underwent a digital diagnostic bilateral mammogram with tomography on 02/16/2018 showing Breast Density Category B. There is no mammographic evidence of breast malignancy.    REVIEW OF SYSTEMS: [name] says that she is working full time, but that she brought paperwork to get some accommodations at work; she is still having some numbness in her fingers and toes, which begin to bother her after about 4 hours of typing. She has been having more migraines, particularly when her arm begins to bother her. For exercise, she walks with her kids. She notices that her left breast gets a little more swollen than her right, and that her right breast feels a little more sore than her left. The patient denies visual changes, nausea, vomiting, or dizziness. There has been no unusual cough, phlegm production, or pleurisy. This been no change in bowel or bladder habits. The patient denies unexplained fatigue or unexplained weight loss, bleeding, rash, or fever. A detailed review of systems was otherwise noncontributory.    PAST MEDICAL HISTORY: Past Medical History:  Diagnosis Date  . Anemia   . Breast asymmetry 12/2015  . Breast cancer ([place]) 2017   right breast  . History of breast cancer 11/2014   right  . History of chemotherapy   .  Migraines   . 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 Personal history of chemotherapy 2017  . Personal history of radiation therapy 2017    PAST SURGICAL HISTORY: Past Surgical History:  Procedure Laterality Date  .  BREAST LUMPECTOMY Right 2017  . BREAST RECONSTRUCTION Right 06/23/2015   Procedure: ONCOPLASTIC RIGHT BREAST REDUCTION;  Surgeon: Irene Limbo, MD;  Location: [place];  Service: Plastics;  Laterality: Right;  . BREAST REDUCTION SURGERY Left 06/23/2015   Procedure: MAMMARY REDUCTION  (BREAST) LEFT BREAST FOR SYMETRY (BILATERAL BREAST REDUCTION);  Surgeon: Irene Limbo, MD;  Location: [place];  Service: Plastics;  Laterality: Left;  . BREAST REDUCTION SURGERY Left 01/15/2016   Procedure: MAMMARY REDUCTION  (BREAST) LEFT;  Surgeon: Irene Limbo, MD;  Location: [place];  Service: Plastics;  Laterality: Left;  Marland Kitchen MASTOPEXY Left 01/15/2016   Procedure: POSSIBLE LEFT MASTOPEXY;  Surgeon: Irene Limbo, MD;  Location: [place];  Service: Plastics;  Laterality: Left;  . PORT-A-CATH REMOVAL Right 01/15/2016   Procedure: REMOVAL OF RIGHT PORT-A-CATH;  Surgeon: Irene Limbo, MD;  Location: [place];  Service: Plastics;  Laterality: Right;  . PORTACATH PLACEMENT Right 12/16/2014   Procedure: INSERTION PORT-A-CATH WITH ULTRASOUND;  Surgeon: Rolm Bookbinder, MD;  Location: [place];  Service: General;  Laterality: Right;  . RADIOACTIVE SEED GUIDED PARTIAL MASTECTOMY WITH AXILLARY SENTINEL LYMPH NODE BIOPSY Right 06/15/2015   Procedure: RADIOACTIVE SEED GUIDED PARTIAL MASTECTOMY WITH AXILLARY SENTINEL LYMPH NODE BIOPSY;  Surgeon: Rolm Bookbinder, MD;  Location: [place];  Service: General;  Laterality: Right;  . REDUCTION MAMMAPLASTY Bilateral 2017    FAMILY HISTORY Family History  Problem Relation Age of Onset  . Mesothelioma Maternal Grandfather        asbestos exposure, died in his 70s  . Heart defect Sister 0  . Breast cancer Paternal Grandmother   . Diabetes Paternal Grandmother   . Cancer Paternal Grandfather        NOS  . Colon cancer Other 103       MGMs brother with colon cancer  .  Cancer Other        several of MGF's sisters with cancer NOS  . Liver cancer Other        MGF's brother  . Heart disease Paternal Aunt   . Heart disease Paternal [name]    The patient's parents are living, her father being 87 and her mother 33 as of July 2016. The patient had no brothers. One sister died at age 74 from cardiac problems. The other sister is in good health. On the maternal side there is a history of colon cancer and an uncle age 58, liver cancer in a great uncle and mesothelioma in the maternal grandfather.   GYNECOLOGIC HISTORY:  No LMP recorded. Menarche age 61. The patient is GX P2. Her periods were interrupted with chemotherapy, but have resumed (August 2018), although now very scant and brief   SOCIAL HISTORY: (Updated 07/04/2018) [place] works at [employer]. She formerly worked in Therapist, art for [employer]. Her husband Antrell works for [employer]. The daughters are [place] and Lovie Macadamia, age 74 and 19 as of 06/2018. Arville Lime will be going to college for photography and videography. The patient attends a local [employer]: Not in place   HEALTH MAINTENANCE: Social History   Tobacco Use  . Smoking status: Never Smoker  . Smokeless  tobacco: Never Used  Substance Use Topics  . Alcohol use: Yes    Comment: rarely  . Drug use: No     Colonoscopy:  PAP: 2014  Bone density:  Lipid panel:  No Known Allergies  Current Outpatient Medications  Medication Sig Dispense Refill  . b complex vitamins tablet Take 1 tablet by mouth daily.    . ferrous sulfate 325 (65 FE) MG tablet Take 325 mg by mouth daily with breakfast.    . gabapentin (NEURONTIN) 300 MG capsule Take 1 capsule (300 mg total) by mouth at bedtime. 90 capsule 4  . LORazepam (ATIVAN) 0.5 MG tablet Take 1 tablet (0.5 mg total) by mouth at bedtime. 30 tablet 0  . meloxicam (MOBIC) 15 MG tablet Take 1 tablet (15 mg total) by mouth daily. 60 tablet 5    . nortriptyline (PAMELOR) 10 MG capsule Start nortriptyline 53m at bedtime for 2 week, then increase to 2 tablet at bedtime 60 capsule 5  . ondansetron (ZOFRAN) 4 MG tablet TAKE 1 TABLET BY MOUTH EVERY 8 HOURS AS NEEDED FOR NAUSEA OR VOMITING 30 tablet 0  . SUMAtriptan (IMITREX) 50 MG tablet Take 1 tablet (50 mg total) by mouth every 2 (two) hours as needed for migraine. Mayrepeat in 2 hrs if needed. 10 tablet 0  . vitamin E (VITAMIN E) 400 UNIT capsule Take 1 capsule (400 Units total) by mouth daily. 30 capsule 0   No current facility-administered medications for this visit.     OBJECTIVE: Young African-American woman who appears well Vitals:   07/04/18 1425  BP: (!) 140/93  Pulse: 86  Resp: 18  Temp: 99.1 F (37.3 C)  SpO2: 100%     Body mass index is 36.79 kg/m.    ECOG FS:1 - Symptomatic but completely ambulatory   Sclerae unicteric, EOMs intact Oropharynx clear and moist No cervical or supraclavicular adenopathy Lungs no rales or rhonchi Heart regular rate and rhythm Abd soft, nontender, positive bowel sounds MSK no focal spinal tenderness, chronic grade 1 right upper extremity lymphedema Neuro: nonfocal, well oriented, appropriate affect Breasts: The right breast is status post lumpectomy and radiation.  The scar in the inferior area, below the areola, remains firm and unchanged.  The left breast is status post reduction mammoplasty.  Both axillae are benign.   LAB RESULTS:  CMP  CBC Latest Ref Rng & Units 07/04/2018 02/01/2018 12/26/2016  WBC 4.0 - 10.5 K/uL 6.3 5.9 5.4  Hemoglobin 12.0 - 15.0 g/dL 11.5(L) 11.8 12.4  Hematocrit 36.0 - 46.0 % 37.5 37.6 38.4  Platelets 150 - 400 K/uL 269 263 245   CMP Latest Ref Rng & Units 02/01/2018 12/26/2016 04/18/2016  Glucose 70 - 99 mg/dL 87 102 102  BUN 6 - 20 mg/dL 11 10.4 12.4  Creatinine 0.44 - 1.00 mg/dL 0.77 0.8 0.8  Sodium 135 - 145 mmol/L 136 138 137  Potassium 3.5 - 5.1 mmol/L 4.2 4.2 4.3  Chloride 98 - 111 mmol/L 103 -  -  CO2 22 - 32 mmol/L 27 28 26   Calcium 8.9 - 10.3 mg/dL 9.3 9.6 9.3  Total Protein 6.5 - 8.1 g/dL 7.8 7.4 7.6  Total Bilirubin 0.3 - 1.2 mg/dL 0.8 0.82 0.61  Alkaline Phos 38 - 126 U/L 74 74 96  AST 15 - 41 U/L 11(L) 16 18  ALT 0 - 44 U/L 11 17 20       Urinalysis    Component Value Date/Time   LABSPEC 1.020 05/06/2009 1439  PHURINE 7.0 05/06/2009 1439   GLUCOSEU NEGATIVE 05/06/2009 1439   HGBUR NEGATIVE 05/06/2009 1439   BILIRUBINUR NEGATIVE 05/06/2009 1439   KETONESUR NEGATIVE 05/06/2009 1439   PROTEINUR NEGATIVE 05/06/2009 1439   UROBILINOGEN 0.2 05/06/2009 1439   NITRITE NEGATIVE 05/06/2009 1439   LEUKOCYTESUR  05/06/2009 1439    NEGATIVE Biochemical Testing Only. Please order routine urinalysis from main lab if confirmatory testing is needed.    STUDIES: No results found.   ASSESSMENT: 43 y.o. BRCA negative [place] woman s/p Right breast biopsy lower inner quadrant 11/21/2014 for a clinical T2 NX, stage 2 invasive ductal carcinoma, grade 2 or 3, estrogen and progesterone receptor negative, HER-2 equivocal, with an Mib-1 of 20%  (a) biopsy of a suspicious axillary lymph node same day was negative, but discordant  (b) review of 80 additional tumor cells by FISH still showed HER-2 not amplified; tumor should be treated as a triple negative  (1) neoadjuvant chemotherapy started 01/01/2015 consisting of cyclophosphamide and doxorubicin in dose dense fashion 4, completed 02/13/2015, followed by paclitaxel/ carboplatin weekly 12 started 02/26/2015, completed 05/14/2015.   (2) genetics testing 12/15/2014 through the Breast/Ovarian gene panel offered by [facility] found no deleterious mutations in ATM, BARD1, BRCA1, BRCA2, BRIP1, CDH1, CHEK2, EPCAM, FANCC, MLH1, MSH2, MSH6, NBN, PALB2, PMS2, PTEN, RAD51C, RAD51D, TP53, and XRCC2  (3) right lumpectomy and sentinel lymph node sampling 06/15/2015 showed a residual pT1b pN0 invasive ductal carcinoma, grade 1, with negative margins.  (There were actually a few microscopic nests of residual tumor and 0.9 is the longest distance between 2 of the microscopic nests--there was not enough tissue for repeat prognostic panel.)  (a) bilateral reduction mammoplasty 06/23/2015 showed no malignancy in either breast  (4) adjuvant radiation 09/01/2015-10/19/2015:  Right breast / 45 Gray @ 1.8 Pearline Cables per fraction x 25 fractions Right breast boost / 16 Gray at [employer] per fraction x 8 fractions    PLAN: [place] is now just over 3 years out from definitive surgery for her breast cancer with no evidence of disease recurrence.  This is very favorable.  She continues to have significant issues related to neuropathy.  For the nighttime neuropathy I am going to put her on gabapentin at 300 mg at bedtime.  She has a good understanding of the possible toxicity side effects and complications of this agent which I did not encourage her to take during the day because it may make her sleepy.  I wrote for 2 more compression sleeves on the right and I gave her information on how to obtain the more up-to-date once  She might benefit from some physical therapy for the right shoulder.  Perhaps if she had a stand-up desk she would have less back pain.  She will investigate that  Otherwise she will see me again in 1 year.  She knows to call for any other issues that may develop before the next visit.    Marcelline Temkin, Virgie Dad, MD  07/04/18 2:51 PM Medical Oncology and Hematology [facility] [address] [place], [place] [address] Tel. [phone]    Fax. [phone]  I, Jacqualyn Posey am acting as a Education administrator for Chauncey Cruel, MD.   I, Lurline Del MD, have reviewed the above documentation for accuracy and completeness, and I agree with the above.

## 2018-07-04 ENCOUNTER — Inpatient Hospital Stay: Payer: Medicare Other

## 2018-07-04 ENCOUNTER — Inpatient Hospital Stay: Payer: Medicare Other | Attending: Oncology | Admitting: Oncology

## 2018-07-04 VITALS — BP 140/93 | HR 86 | Temp 99.1°F | Resp 18 | Ht 65.0 in | Wt 221.1 lb

## 2018-07-04 DIAGNOSIS — Z171 Estrogen receptor negative status [ER-]: Secondary | ICD-10-CM | POA: Insufficient documentation

## 2018-07-04 DIAGNOSIS — G620 Drug-induced polyneuropathy: Secondary | ICD-10-CM

## 2018-07-04 DIAGNOSIS — Z79899 Other long term (current) drug therapy: Secondary | ICD-10-CM | POA: Diagnosis not present

## 2018-07-04 DIAGNOSIS — Z923 Personal history of irradiation: Secondary | ICD-10-CM | POA: Insufficient documentation

## 2018-07-04 DIAGNOSIS — Z9221 Personal history of antineoplastic chemotherapy: Secondary | ICD-10-CM

## 2018-07-04 DIAGNOSIS — Z853 Personal history of malignant neoplasm of breast: Secondary | ICD-10-CM | POA: Insufficient documentation

## 2018-07-04 DIAGNOSIS — C50311 Malignant neoplasm of lower-inner quadrant of right female breast: Secondary | ICD-10-CM

## 2018-07-04 DIAGNOSIS — T451X5A Adverse effect of antineoplastic and immunosuppressive drugs, initial encounter: Secondary | ICD-10-CM

## 2018-07-04 LAB — COMPREHENSIVE METABOLIC PANEL
ALBUMIN: 4.1 g/dL (ref 3.5–5.0)
ALT: 10 U/L (ref 0–44)
AST: 11 U/L — AB (ref 15–41)
Alkaline Phosphatase: 74 U/L (ref 38–126)
Anion gap: 8 (ref 5–15)
BILIRUBIN TOTAL: 0.5 mg/dL (ref 0.3–1.2)
BUN: 11 mg/dL (ref 6–20)
CALCIUM: 9 mg/dL (ref 8.9–10.3)
CO2: 26 mmol/L (ref 22–32)
CREATININE: 0.79 mg/dL (ref 0.44–1.00)
Chloride: 105 mmol/L (ref 98–111)
Glucose, Bld: 95 mg/dL (ref 70–99)
Potassium: 3.7 mmol/L (ref 3.5–5.1)
Sodium: 139 mmol/L (ref 135–145)
Total Protein: 7.8 g/dL (ref 6.5–8.1)

## 2018-07-04 LAB — CBC WITH DIFFERENTIAL/PLATELET
Abs Immature Granulocytes: 0.01 10*3/uL (ref 0.00–0.07)
BASOS ABS: 0 10*3/uL (ref 0.0–0.1)
Basophils Relative: 0 %
EOS ABS: 0 10*3/uL (ref 0.0–0.5)
EOS PCT: 1 %
HCT: 37.5 % (ref 36.0–46.0)
Hemoglobin: 11.5 g/dL — ABNORMAL LOW (ref 12.0–15.0)
Immature Granulocytes: 0 %
LYMPHS PCT: 35 %
Lymphs Abs: 2.2 10*3/uL (ref 0.7–4.0)
MCH: 25.7 pg — ABNORMAL LOW (ref 26.0–34.0)
MCHC: 30.7 g/dL (ref 30.0–36.0)
MCV: 83.7 fL (ref 80.0–100.0)
Monocytes Absolute: 0.3 10*3/uL (ref 0.1–1.0)
Monocytes Relative: 5 %
NRBC: 0 % (ref 0.0–0.2)
Neutro Abs: 3.7 10*3/uL (ref 1.7–7.7)
Neutrophils Relative %: 59 %
PLATELETS: 269 10*3/uL (ref 150–400)
RBC: 4.48 MIL/uL (ref 3.87–5.11)
RDW: 14.1 % (ref 11.5–15.5)
WBC: 6.3 10*3/uL (ref 4.0–10.5)

## 2018-07-04 MED ORDER — GABAPENTIN 300 MG PO CAPS: 300.0000 mg | ORAL_CAPSULE | Freq: Every day | ORAL | 4 refills | Status: DC

## 2018-07-05 LAB — CANCER ANTIGEN 27.29: CAN 27.29: 14.9 U/mL (ref 0.0–38.6)

## 2018-07-10 NOTE — Progress Notes (Signed)
FMLA medical documentation forms for [place] mailed to patient address on file. No fax number provided to fax forms to.

## 2018-07-12 ENCOUNTER — Telehealth: Payer: Self-pay

## 2018-07-12 MED ORDER — GABAPENTIN 300 MG PO CAPS: 300.0000 mg | ORAL_CAPSULE | Freq: Every day | ORAL | 4 refills | Status: DC

## 2018-07-12 NOTE — Telephone Encounter (Signed)
Pt called to report that she has not received the Neurontin that Dr. Jana Hakim recommend at last visit.  Also, requesting update on FMLA paperwork.   Nurse returned called and left voicemail with updating that we will send new RX and FMLA was mailed on 07/10/2018.  Call back number left if any questions.

## 2018-07-18 ENCOUNTER — Other Ambulatory Visit: Payer: Self-pay

## 2018-07-18 ENCOUNTER — Ambulatory Visit: Payer: Medicare Other | Attending: Oncology | Admitting: Rehabilitation

## 2018-07-18 ENCOUNTER — Encounter: Payer: Self-pay | Admitting: Rehabilitation

## 2018-07-18 DIAGNOSIS — M25611 Stiffness of right shoulder, not elsewhere classified: Secondary | ICD-10-CM | POA: Diagnosis not present

## 2018-07-18 DIAGNOSIS — I890 Lymphedema, not elsewhere classified: Secondary | ICD-10-CM

## 2018-07-18 DIAGNOSIS — M6281 Muscle weakness (generalized): Secondary | ICD-10-CM | POA: Insufficient documentation

## 2018-07-18 DIAGNOSIS — M25511 Pain in right shoulder: Secondary | ICD-10-CM | POA: Diagnosis not present

## 2018-07-18 DIAGNOSIS — R293 Abnormal posture: Secondary | ICD-10-CM | POA: Insufficient documentation

## 2018-07-18 NOTE — Therapy (Signed)
[place], [place], [address] Phone: [phone]   Fax:  [phone]  Physical Therapy Evaluation  Patient Details  Name: Susan Wilson MRN: [id number] Date of Birth: December 05, 1975 Referring Provider (PT): Dr. Jana Hakim   Encounter Date: 07/18/2018  PT End of Session - 07/18/18 2112    Visit Number  1    Number of Visits  13    Date for PT Re-Evaluation  08/29/18    Authorization Type  Medicare    PT Start Time  1302    PT Stop Time  1345    PT Time Calculation (min)  43 min    Activity Tolerance  Patient tolerated treatment well    Behavior During Therapy  [facility] for tasks assessed/performed       Past Medical History:  Diagnosis Date  . Anemia   . Breast asymmetry 12/2015  . Breast cancer ([place]) 2017   right breast  . History of breast cancer 11/2014   right  . History of chemotherapy   . Migraines   . 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 Personal history of chemotherapy 2017  . Personal history of radiation therapy 2017    Past Surgical History:  Procedure Laterality Date  . BREAST LUMPECTOMY Right 2017  . BREAST RECONSTRUCTION Right 06/23/2015   Procedure: ONCOPLASTIC RIGHT BREAST REDUCTION;  Surgeon: Irene Limbo, MD;  Location: [place];  Service: Plastics;  Laterality: Right;  . BREAST REDUCTION SURGERY Left 06/23/2015   Procedure: MAMMARY REDUCTION  (BREAST) LEFT BREAST FOR SYMETRY (BILATERAL BREAST REDUCTION);  Surgeon: Irene Limbo, MD;  Location: [place];  Service: Plastics;  Laterality: Left;  . BREAST REDUCTION SURGERY Left 01/15/2016   Procedure: MAMMARY REDUCTION  (BREAST) LEFT;  Surgeon: Irene Limbo, MD;  Location: [place];  Service: Plastics;  Laterality: Left;  Marland Kitchen MASTOPEXY Left 01/15/2016   Procedure: POSSIBLE LEFT MASTOPEXY;  Surgeon: Irene Limbo, MD;  Location: [place];  Service: Plastics;  Laterality: Left;  . PORT-A-CATH REMOVAL Right 01/15/2016   Procedure: REMOVAL OF RIGHT PORT-A-CATH;  Surgeon: Irene Limbo, MD;  Location: [place];  Service: Plastics;  Laterality: Right;  . PORTACATH PLACEMENT Right 12/16/2014   Procedure: INSERTION PORT-A-CATH WITH ULTRASOUND;  Surgeon: Rolm Bookbinder, MD;  Location: [place];  Service: General;  Laterality: Right;  . RADIOACTIVE SEED GUIDED PARTIAL MASTECTOMY WITH AXILLARY SENTINEL LYMPH NODE BIOPSY Right 06/15/2015   Procedure: RADIOACTIVE SEED GUIDED PARTIAL MASTECTOMY WITH AXILLARY SENTINEL LYMPH NODE BIOPSY;  Surgeon: Rolm Bookbinder, MD;  Location: [place];  Service: General;  Laterality: Right;  . REDUCTION MAMMAPLASTY Bilateral 2017    There were no vitals filed for this visit.   Subjective Assessment - 07/18/18 1308    Subjective  My Right shoulder pain is just painful, it is still hard to open bags and carry things.  Reaching backwards is hard. I already have my compression garments and should be getting a pump.  I need to do some find of form for the pump.     Pertinent History  Rt lumpectomy and breast reduction in 2017 with SLNB.  6 lymph nodes removed all negative. A second surgery bilaterally to adjust size reduction. Completed radiation and chemotherapy.  Rt sided lymphedema and breast lymphedema     Limitations  Lifting    Patient Stated Goals  decrease the Rt shoulder pain  Currently in Pain?  Yes    Pain Score  7     Pain Location  Shoulder   down into the arm and shoulder blade and into the breast   Pain Orientation  Right    Pain Descriptors / Indicators  Sharp;Spasm    Pain Type  Chronic pain    Pain Onset  More than a month ago    Pain Frequency  Constant    Aggravating Factors   reaching, using the arm, rotation    Pain Relieving Factors  mobic, kinesiotape          [facility] PT Assessment - 07/18/18 0001      Assessment   Medical Diagnosis   Rt chronic shoulder pain    Referring Provider (PT)  Dr. Jana Hakim    Onset Date/Surgical Date  06/23/15    Hand Dominance  Right    Prior Therapy  yes here for the same issues      Precautions   Precaution Comments  lymphedema, cancer history      Restrictions   Weight Bearing Restrictions  No      Balance Screen   Has the patient fallen in the past 6 months  No    Has the patient had a decrease in activity level because of a fear of falling?   No    Is the patient reluctant to leave their home because of a fear of falling?   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   work makes shoulder worse   Leisure  walking      Cognition   Overall Cognitive Status  Within Functional Limits for tasks assessed      Observation/Other Assessments   Observations  Rt upper arm slightly larger on the Rt seated with arms out to the side       Sensation   Additional Comments  finger tips and toe numbness from chemotherapy       Coordination   Gross Motor Movements are Fluid and Coordinated  Yes      Posture/Postural Control   Posture/Postural Control  Postural limitations    Postural Limitations  Rounded Shoulders;Forward head      ROM / Strength   AROM / PROM / Strength  AROM;PROM;Strength      AROM   AROM Assessment Site  Shoulder    Right/Left Shoulder  Right;Left    Right Shoulder Flexion  143 Degrees   pain back of shoulder   Right Shoulder ABduction  165 Degrees   achy   Right Shoulder Internal Rotation  80 Degrees   pain   Right Shoulder External Rotation  90 Degrees   pull and pain   Left Shoulder Flexion  160 Degrees    Left Shoulder ABduction  170 Degrees    Left Shoulder Internal Rotation  82 Degrees     Left Shoulder External Rotation  93 Degrees      PROM   PROM Assessment Site  Shoulder    Right/Left Shoulder  Right;Left      Strength   Overall Strength Comments  pain in Rt shoulder    Strength Assessment Site  Shoulder  Right/Left Shoulder  Right;Left    Right Shoulder Flexion  4+/5    Right Shoulder ABduction  4+/5    Right Shoulder Internal Rotation  4/5    Right Shoulder External Rotation  4/5    Left Shoulder Flexion  4+/5    Left Shoulder ABduction  4+/5    Left Shoulder Internal Rotation  4+/5    Left Shoulder External Rotation  4+/5      Palpation   Palpation comment  +1 ttp Rt bicep tendon        LYMPHEDEMA/ONCOLOGY QUESTIONNAIRE - 07/18/18 1327      Type   Cancer Type  right breast      Surgeries   Lumpectomy Date  06/15/15    Other Surgery Date  01/15/16    Number Lymph Nodes Removed  7      Treatment   Past Chemotherapy Treatment  Yes    Past Radiation Treatment  Yes      What other symptoms do you have   Are you having pitting edema  No      Lymphedema Assessments   Lymphedema Assessments  Upper extremities      Right Upper Extremity Lymphedema   15 cm Proximal to Olecranon Process  41 cm    10 cm Proximal to Olecranon Process  38.1 cm    Olecranon Process  29.5 cm    10 cm Proximal to Ulnar Styloid Process  26.3 cm    Just Proximal to Ulnar Styloid Process  18.8 cm    Across Hand at [employer]  21.1 cm    At [place] of 2nd Digit  6.5 cm      Left Upper Extremity Lymphedema   15 cm Proximal to Olecranon Process  40.8 cm    10 cm Proximal to Olecranon Process  40 cm    Olecranon Process  31.5 cm    10 cm Proximal to Ulnar Styloid Process  25.6 cm    Just Proximal to Ulnar Styloid Process  18.3 cm    Across Hand at [employer]  21.2 cm    At [place] of 2nd Digit  6.2 cm          Quick Dash - 07/18/18 0001    Open a tight or new jar  Severe difficulty    Do heavy household chores (wash walls, wash floors)  Severe difficulty     Carry a shopping bag or briefcase  Severe difficulty    Wash your back  Severe difficulty    Use a knife to cut food  Mild difficulty    Recreational activities in which you take some force or impact through your arm, shoulder, or hand (golf, hammering, tennis)  Severe difficulty    During the past week, to what extent has your arm, shoulder or hand problem interfered with your normal social activities with family, friends, neighbors, or groups?  Quite a bit    During the past week, to what extent has your arm, shoulder or hand problem limited your work or other regular daily activities  Quite a bit    Arm, shoulder, or hand pain.  Extreme    Tingling (pins and needles) in your arm, shoulder, or hand  Extreme    Difficulty Sleeping  Moderate difficulty    DASH Score  72.73 %        Objective measurements completed on examination: See above findings.  PT Education - 07/18/18 2112    Education Details  POC    Person(s) Educated  Patient    Methods  Explanation    Comprehension  Verbalized understanding          PT Long Term Goals - 07/18/18 2122      PT LONG TERM GOAL #1   Title  Pt will report the pain and discomfort in her right upper quadant is decreased by 50%    Baseline  7/10    Time  6    Period  Weeks    Status  New      PT LONG TERM GOAL #2   Title  Pt will report she will be able to use the compression pump to help with her symptoms of lymphedema in her right upper quadrant     Baseline  waiting for pump    Time  6    Period  Weeks      PT LONG TERM GOAL #3   Title  Pt will improve Rt shoulder AROM to painfree and WNL    Time  6    Period  Weeks    Status  New      PT LONG TERM GOAL #4   Title  Pt will be independent in a strengthening program to right shoulder     Status  New      PT LONG TERM GOAL #5   Title  Pt will decrease QDASH to 25% or less    Baseline  76%    Time  6    Period  Weeks    Status  New              Plan - 07/18/18 2114    Clinical Impression Statement  Pt familiar to this clinic returns with continued Rt shoulder pain and chronic Rt UE lymphedema.  Currently the lymphedema is under control with her compression and pt will be getting a pump soon.  Pt also has compression bras and foam inserts for the bra so focus of this session will be the shoulder pain.  Pt has also attempted PT for this and reports she is knowledgeable about kinesiotape and exercises for at home which have not yet helped.  Currently pt reports a 72% limitation in shoulder function according to the [facility].  Painful Rt shoulder AROM globally and weakness into ER/IR.      Personal Factors and Comorbidities  Past/Current Experience;Time since onset of injury/illness/exacerbation    Examination-Activity Limitations  Carry;Lift;Reach Overhead    Examination-Participation Restrictions  Community Activity;Other   work   Merchant [employer] officer  Stable/Uncomplicated    Designer, jewellery  Low    Rehab Potential  Fair    PT Frequency  2x / week    PT Duration  6 weeks    PT Treatment/Interventions  ADLs/Self Care Home Management;Iontophoresis 59m/ml Dexamethasone;Electrical Stimulation;Therapeutic exercise;Patient/family education;Manual techniques;Manual lymph drainage;Passive range of motion;Taping    PT Next Visit Plan  Rt shoulderPROM/STM scapular muscles/region assessment, scapular strengthening/impingment type treatment    Consulted and Agree with Plan of Care  Patient       Patient will benefit from skilled therapeutic intervention in order to improve the following deficits and impairments:  Decreased activity tolerance, Decreased range of motion, Decreased strength, Pain, Increased edema  Visit Diagnosis: Stiffness of right shoulder, not elsewhere classified  Right shoulder pain, unspecified chronicity  Abnormal posture  Muscle weakness (generalized)  Lymphedema, not elsewhere  classified  Problem List Patient Active Problem List   Diagnosis Date Noted  . Port catheter in place 12/28/2015  . Chemotherapy induced neutropenia ([place]) 05/14/2015  . 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 Tooth ache 01/09/2015  . Genetic testing 12/26/2014  . Family history of breast cancer   . Malignant neoplasm of lower-inner quadrant of right breast of female, estrogen receptor negative ([place]) 11/25/2014  . Migraine 11/19/2014    Shan Levans, PT 07/18/2018, 9:24 PM  [place], [place], [address] Phone: [phone]   Fax:  [phone]  Name: Susan Wilson MRN: [id number] Date of Birth: 08-03-1975

## 2018-07-23 ENCOUNTER — Telehealth: Payer: Self-pay | Admitting: *Deleted

## 2018-07-23 NOTE — Telephone Encounter (Signed)
This RN received VM from pt stating she needs additional information added to recent FMLA completed by this office and she received via her email.  She states she needs information added regarding planned visit and therapy ( including any PT appointments ).  She gave her email as [email or web address].  Noted completion of form by this office.  This phone note will be sent to Ivin Poot and Ronaldo Miyamoto per HIM/nursing for further assistance.

## 2018-07-24 ENCOUNTER — Ambulatory Visit: Payer: Medicare Other | Admitting: Physical Therapy

## 2018-07-24 DIAGNOSIS — R293 Abnormal posture: Secondary | ICD-10-CM

## 2018-07-24 DIAGNOSIS — I890 Lymphedema, not elsewhere classified: Secondary | ICD-10-CM

## 2018-07-24 DIAGNOSIS — M25511 Pain in right shoulder: Secondary | ICD-10-CM

## 2018-07-24 DIAGNOSIS — M25611 Stiffness of right shoulder, not elsewhere classified: Secondary | ICD-10-CM | POA: Diagnosis not present

## 2018-07-24 DIAGNOSIS — M6281 Muscle weakness (generalized): Secondary | ICD-10-CM | POA: Diagnosis not present

## 2018-07-24 NOTE — Progress Notes (Signed)
FMLA updated to excuse patient from work for doctors appointments and physical therapy appointments. Emailed forms to patient email at [email or web address].

## 2018-07-24 NOTE — Therapy (Signed)
[place], [place], [address] Phone: [phone]   Fax:  [phone]  Physical Therapy Treatment  Patient Details  Name: Susan Wilson MRN: [id number] Date of Birth: 1975/07/12 Referring Provider (PT): Dr. Jana Hakim   Encounter Date: 07/24/2018  PT End of Session - 07/24/18 1724    Visit Number  2    Number of Visits  13    Date for PT Re-Evaluation  08/29/18    Authorization Type  Medicare    PT Start Time  1432    PT Stop Time  1515    PT Time Calculation (min)  43 min    Activity Tolerance  Patient tolerated treatment well    Behavior During Therapy  [facility] for tasks assessed/performed       Past Medical History:  Diagnosis Date  . Anemia   . Breast asymmetry 12/2015  . Breast cancer ([place]) 2017   right breast  . History of breast cancer 11/2014   right  . History of chemotherapy   . Migraines   . 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 Personal history of chemotherapy 2017  . Personal history of radiation therapy 2017    Past Surgical History:  Procedure Laterality Date  . BREAST LUMPECTOMY Right 2017  . BREAST RECONSTRUCTION Right 06/23/2015   Procedure: ONCOPLASTIC RIGHT BREAST REDUCTION;  Surgeon: Irene Limbo, MD;  Location: [place];  Service: Plastics;  Laterality: Right;  . BREAST REDUCTION SURGERY Left 06/23/2015   Procedure: MAMMARY REDUCTION  (BREAST) LEFT BREAST FOR SYMETRY (BILATERAL BREAST REDUCTION);  Surgeon: Irene Limbo, MD;  Location: [place];  Service: Plastics;  Laterality: Left;  . BREAST REDUCTION SURGERY Left 01/15/2016   Procedure: MAMMARY REDUCTION  (BREAST) LEFT;  Surgeon: Irene Limbo, MD;  Location: [place];  Service: Plastics;  Laterality: Left;  Marland Kitchen MASTOPEXY Left 01/15/2016   Procedure: POSSIBLE LEFT MASTOPEXY;  Surgeon: Irene Limbo, MD;  Location: [place];  Service: Plastics;  Laterality: Left;  . PORT-A-CATH REMOVAL Right 01/15/2016   Procedure: REMOVAL OF RIGHT PORT-A-CATH;  Surgeon: Irene Limbo, MD;  Location: [place];  Service: Plastics;  Laterality: Right;  . PORTACATH PLACEMENT Right 12/16/2014   Procedure: INSERTION PORT-A-CATH WITH ULTRASOUND;  Surgeon: Rolm Bookbinder, MD;  Location: [place];  Service: General;  Laterality: Right;  . RADIOACTIVE SEED GUIDED PARTIAL MASTECTOMY WITH AXILLARY SENTINEL LYMPH NODE BIOPSY Right 06/15/2015   Procedure: RADIOACTIVE SEED GUIDED PARTIAL MASTECTOMY WITH AXILLARY SENTINEL LYMPH NODE BIOPSY;  Surgeon: Rolm Bookbinder, MD;  Location: [place];  Service: General;  Laterality: Right;  . REDUCTION MAMMAPLASTY Bilateral 2017    There were no vitals filed for this visit.  Subjective Assessment - 07/24/18 1443    Subjective  Still achy every day, The pain and spasms go up and down.  Pt asks me to resubmit demographics to [employer]     Pertinent History  Rt lumpectomy and breast reduction in 2017 with SLNB.  6 lymph nodes removed all negative. A second surgery bilaterally to adjust size reduction. Completed radiation and chemotherapy.  Rt sided lymphedema and breast lymphedema     Patient Stated Goals  decrease the Rt shoulder pain    Currently in Pain?  Yes    Pain Score  5     Pain Location  Shoulder    Pain Orientation  Right  Pain Descriptors / Indicators  Spasm;Sharp    Pain Type  Chronic pain    Pain Radiating Towards  starts at the top of the shoulder and goes down into shoulder blade and goes down in to hand     Pain Onset  More than a month ago    Aggravating Factors   reaching makes it really worse. or typing over 5 hours at work     Pain Relieving Factors  compression stuff at work helps.                        [facility] Adult PT Treatment/Exercise - 07/24/18 0001      Manual Therapy   Manual Therapy  Soft tissue  mobilization;Myofascial release;Passive ROM    Manual therapy comments  intermittent c/o numbness and tingling in hand during treatment.  She has a difficult to palpate right radial pulse so attempted tests for thoracic outlet syndrome were not reliable.     Soft tissue mobilization  in left sidelying and with thick massage cream, soft tissue work to tight muscles in posterior shoulder and scapular area with prolonged pressure at trigger points     Myofascial Release  extra work for myofascial release to tight trigger point and subscapularis muscle in both supine and sidelying     Passive ROM  to right shoulder                   PT Long Term Goals - 07/18/18 2122      PT LONG TERM GOAL #1   Title  Pt will report the pain and discomfort in her right upper quadant is decreased by 50%    Baseline  7/10    Time  6    Period  Weeks    Status  New      PT LONG TERM GOAL #2   Title  Pt will report she will be able to use the compression pump to help with her symptoms of lymphedema in her right upper quadrant     Baseline  waiting for pump    Time  6    Period  Weeks      PT LONG TERM GOAL #3   Title  Pt will improve Rt shoulder AROM to painfree and WNL    Time  6    Period  Weeks    Status  New      PT LONG TERM GOAL #4   Title  Pt will be independent in a strengthening program to right shoulder     Status  New      PT LONG TERM GOAL #5   Title  Pt will decrease QDASH to 25% or less    Baseline  76%    Time  6    Period  Weeks    Status  New            Plan - 07/24/18 1724    Clinical Impression Statement  Pt had palpable decrease in muscle tightness and trigger points with manual work at end of treatment.      PT Treatment/Interventions  ADLs/Self Care Home Management;Iontophoresis 6m/ml Dexamethasone;Electrical Stimulation;Therapeutic exercise;Patient/family education;Manual techniques;Manual lymph drainage;Passive range of motion;Taping    PT Next Visit Plan   Rt shoulderPROM/STM scapular muscles/region assessment, scapular strengthening/impingment type treatment    Recommended Other Services  demographics sent to [employer] 3/10    Consulted and Agree with Plan of Care  Patient  Patient will benefit from skilled therapeutic intervention in order to improve the following deficits and impairments:     Visit Diagnosis: Stiffness of right shoulder, not elsewhere classified  Right shoulder pain, unspecified chronicity  Abnormal posture  Muscle weakness (generalized)  Lymphedema, not elsewhere classified     Problem List Patient Active Problem List   Diagnosis Date Noted  . Port catheter in place 12/28/2015  . Chemotherapy induced neutropenia ([place]) 05/14/2015  . 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 Tooth ache 01/09/2015  . Genetic testing 12/26/2014  . Family history of breast cancer   . Malignant neoplasm of lower-inner quadrant of right breast of female, estrogen receptor negative ([place]) 11/25/2014  . Migraine 11/19/2014   Donato Heinz. Owens Shark PT  Susan Wilson 07/24/2018, 5:29 PM  [place], [place], [address] Phone: [phone]   Fax:  [phone]  Name: Susan Wilson MRN: [id number] Date of Birth: Sep 24, 1975

## 2018-07-31 ENCOUNTER — Other Ambulatory Visit: Payer: Self-pay

## 2018-07-31 ENCOUNTER — Encounter: Payer: Self-pay | Admitting: Physical Therapy

## 2018-07-31 ENCOUNTER — Ambulatory Visit: Payer: Medicare Other | Admitting: Physical Therapy

## 2018-07-31 DIAGNOSIS — M6281 Muscle weakness (generalized): Secondary | ICD-10-CM

## 2018-07-31 DIAGNOSIS — M25611 Stiffness of right shoulder, not elsewhere classified: Secondary | ICD-10-CM

## 2018-07-31 DIAGNOSIS — M25511 Pain in right shoulder: Secondary | ICD-10-CM | POA: Diagnosis not present

## 2018-07-31 DIAGNOSIS — I890 Lymphedema, not elsewhere classified: Secondary | ICD-10-CM | POA: Diagnosis not present

## 2018-07-31 DIAGNOSIS — R293 Abnormal posture: Secondary | ICD-10-CM

## 2018-07-31 NOTE — Therapy (Signed)
[place], [place], [address] Phone: [phone]   Fax:  [phone]  Physical Therapy Treatment  Patient Details  Name: MARIANELA MANDRELL MRN: [id number] Date of Birth: [birth year] Referring Provider (PT): Dr. Jana Hakim   Encounter Date: 07/31/2018  PT End of Session - 07/31/18 1805    Visit Number  3    Number of Visits  13    Date for PT Re-Evaluation  08/29/18    PT Start Time  1430    PT Stop Time  1515    PT Time Calculation (min)  45 min    Activity Tolerance  Patient tolerated treatment well    Behavior During Therapy  [facility] for tasks assessed/performed       Past Medical History:  Diagnosis Date  . Anemia   . Breast asymmetry 12/2015  . Breast cancer ([place]) 2017   right breast  . History of breast cancer 11/2014   right  . History of chemotherapy   . Migraines   . 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 Personal history of chemotherapy 2017  . Personal history of radiation therapy 2017    Past Surgical History:  Procedure Laterality Date  . BREAST LUMPECTOMY Right 2017  . BREAST RECONSTRUCTION Right 06/23/2015   Procedure: ONCOPLASTIC RIGHT BREAST REDUCTION;  Surgeon: Irene Limbo, MD;  Location: [employer];  Service: Plastics;  Laterality: Right;  . BREAST REDUCTION SURGERY Left 06/23/2015   Procedure: MAMMARY REDUCTION  (BREAST) LEFT BREAST FOR SYMETRY (BILATERAL BREAST REDUCTION);  Surgeon: Irene Limbo, MD;  Location: [place];  Service: Plastics;  Laterality: Left;  . BREAST REDUCTION SURGERY Left 01/15/2016   Procedure: MAMMARY REDUCTION  (BREAST) LEFT;  Surgeon: Irene Limbo, MD;  Location: [place];  Service: Plastics;  Laterality: Left;  Marland Kitchen MASTOPEXY Left 01/15/2016   Procedure: POSSIBLE LEFT MASTOPEXY;  Surgeon: Irene Limbo, MD;  Location: [place];  Service: Plastics;   Laterality: Left;  . PORT-A-CATH REMOVAL Right 01/15/2016   Procedure: REMOVAL OF RIGHT PORT-A-CATH;  Surgeon: Irene Limbo, MD;  Location: [place];  Service: Plastics;  Laterality: Right;  . PORTACATH PLACEMENT Right 12/16/2014   Procedure: INSERTION PORT-A-CATH WITH ULTRASOUND;  Surgeon: Rolm Bookbinder, MD;  Location: [place];  Service: General;  Laterality: Right;  . RADIOACTIVE SEED GUIDED PARTIAL MASTECTOMY WITH AXILLARY SENTINEL LYMPH NODE BIOPSY Right 06/15/2015   Procedure: RADIOACTIVE SEED GUIDED PARTIAL MASTECTOMY WITH AXILLARY SENTINEL LYMPH NODE BIOPSY;  Surgeon: Rolm Bookbinder, MD;  Location: [place];  Service: General;  Laterality: Right;  . REDUCTION MAMMAPLASTY Bilateral 2017    There were no vitals filed for this visit.  Subjective Assessment - 07/31/18 1441    Subjective  Pt says she is a little achy today , but that might be due to the weather. She will be working from home,.     Pertinent History  Rt lumpectomy and breast reduction in 2017 with SLNB.  6 lymph nodes removed all negative. A second surgery bilaterally to adjust size reduction. Completed radiation and chemotherapy.  Rt sided lymphedema and breast lymphedema     Patient Stated Goals  decrease the Rt shoulder pain    Currently in Pain?  Yes    Pain Score  3     Pain Location  Shoulder    Pain Orientation  Right  [place] Adult PT Treatment/Exercise - 07/31/18 0001      Self-Care   Self-Care  Other Self-Care Comments    Other Self-Care Comments   emailed Flexitouch to follow up and later heard back that there will be a demonstration on Monday       Exercises   Exercises  Shoulder      Shoulder Exercises: Seated   External Rotation  Strengthening;Right;Left;10 reps;Theraband    Theraband Level (Shoulder External Rotation)  Level 2 (Red)      Shoulder Exercises: Standing   Extension  Strengthening;Right;10 reps;Theraband     Theraband Level (Shoulder Extension)  Level 2 (Red)    Other Standing Exercises  slide elbows up the wall without and with red band    Other Standing Exercises  red band in hands and elbows back x 10 reps       Shoulder Exercises: Pulleys   Flexion  2 minutes    ABduction  2 minutes      Manual Therapy   Manual Therapy  Soft tissue mobilization;Myofascial release;Passive ROM    Soft tissue mobilization  in left sidelying and with thick massage cream, soft tissue work to tight muscles in posterior shoulder and scapular area with prolonged pressure at trigger points     Myofascial Release  extra work for myofascial release to tight trigger point and subscapularis muscle in both supine and sidelying                   PT Long Term Goals - 07/18/18 2122      PT LONG TERM GOAL #1   Title  Pt will report the pain and discomfort in her right upper quadant is decreased by 50%    Baseline  7/10    Time  6    Period  Weeks    Status  New      PT LONG TERM GOAL #2   Title  Pt will report she will be able to use the compression pump to help with her symptoms of lymphedema in her right upper quadrant     Baseline  waiting for pump    Time  6    Period  Weeks      PT LONG TERM GOAL #3   Title  Pt will improve Rt shoulder AROM to painfree and WNL    Time  6    Period  Weeks    Status  New      PT LONG TERM GOAL #4   Title  Pt will be independent in a strengthening program to right shoulder     Status  New      PT LONG TERM GOAL #5   Title  Pt will decrease QDASH to 25% or less    Baseline  76%    Time  6    Period  Weeks    Status  New            Plan - 07/31/18 1806    Clinical Impression Statement  Upgraded exercise today.  Pt continues to have very tight areas in muscle of right posterior shoulder.  She will get Flexi demo on Monday     PT Next Visit Plan  Rt shoulderPROM/STM scapular muscles/region assessment, scapular strengthening/impingment type treatment .   if limited improvement ?? dry needling??    Consulted and Agree with Plan of Care  Patient       Patient will benefit from skilled therapeutic intervention in order to  improve the following deficits and impairments:     Visit Diagnosis: Stiffness of right shoulder, not elsewhere classified  Right shoulder pain, unspecified chronicity  Abnormal posture  Muscle weakness (generalized)  Lymphedema, not elsewhere classified     Problem List Patient Active Problem List   Diagnosis Date Noted  . Port catheter in place 12/28/2015  . Chemotherapy induced neutropenia ([place]) 05/14/2015  . Chemotherapy-induced neuropathy ([place]) 04/08/2015  . 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 Tooth ache 01/09/2015  . Genetic testing 12/26/2014  . Family history of breast cancer   . Malignant neoplasm of lower-inner quadrant of right breast of female, estrogen receptor negative ([place]) 11/25/2014  . Migraine 11/19/2014   Donato Heinz. Owens Shark PT  [place] Levo 07/31/2018, 6:08 PM  [place], [place], [address] Phone: [phone]   Fax:  [phone]  Name: ONNIKA SIEBEL MRN: [id number] Date of Birth: 10-14-1975

## 2018-08-01 ENCOUNTER — Ambulatory Visit: Payer: Medicare Other | Admitting: Obstetrics & Gynecology

## 2018-08-02 ENCOUNTER — Ambulatory Visit: Payer: Medicare Other | Admitting: Physical Therapy

## 2018-08-02 ENCOUNTER — Encounter: Payer: Self-pay | Admitting: Physical Therapy

## 2018-08-02 ENCOUNTER — Other Ambulatory Visit: Payer: Self-pay

## 2018-08-02 DIAGNOSIS — M25611 Stiffness of right shoulder, not elsewhere classified: Secondary | ICD-10-CM

## 2018-08-02 DIAGNOSIS — M6281 Muscle weakness (generalized): Secondary | ICD-10-CM

## 2018-08-02 DIAGNOSIS — M25511 Pain in right shoulder: Secondary | ICD-10-CM | POA: Diagnosis not present

## 2018-08-02 DIAGNOSIS — R293 Abnormal posture: Secondary | ICD-10-CM

## 2018-08-02 DIAGNOSIS — I890 Lymphedema, not elsewhere classified: Secondary | ICD-10-CM | POA: Diagnosis not present

## 2018-08-02 NOTE — Patient Instructions (Signed)
Access Code: [email or web address]  URL: [email or web address]  Date: 08/02/2018  Prepared by: Manus Gunning   Exercises  Seated Bilateral Shoulder External Rotation with Resistance - 10 reps - 1 sets - 1x daily - 7x weekly  Standing Shoulder Extension with Resistance - 10 reps - 1 sets - 1x daily - 7x weekly  Forearm Plank on Wall - 10 reps - 1 sets - 1x daily - 7x weekly  Forearm Plank on Wall - 10 reps - 1 sets - 1x daily - 7x weekly

## 2018-08-02 NOTE — Therapy (Signed)
[place], [place], [address] Phone: [phone]   Fax:  [phone]  Physical Therapy Treatment  Patient Details  Name: Susan Wilson MRN: [id number] Date of Birth: 05/21/1975 Referring Provider (PT): Dr. Jana Hakim   Encounter Date: 08/02/2018  PT End of Session - 08/02/18 1215    Visit Number  4    Number of Visits  13    Date for PT Re-Evaluation  08/29/18    Authorization Type  Medicare    PT Start Time  0812   pt arrived late   PT Stop Time  0845    PT Time Calculation (min)  33 min    Activity Tolerance  Patient tolerated treatment well    Behavior During Therapy  [facility] for tasks assessed/performed       Past Medical History:  Diagnosis Date  . Anemia   . Breast asymmetry 12/2015  . Breast cancer ([place]) 2017   right breast  . History of breast cancer 11/2014   right  . History of chemotherapy   . Migraines   . 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 Personal history of chemotherapy 2017  . Personal history of radiation therapy 2017    Past Surgical History:  Procedure Laterality Date  . BREAST LUMPECTOMY Right 2017  . BREAST RECONSTRUCTION Right 06/23/2015   Procedure: ONCOPLASTIC RIGHT BREAST REDUCTION;  Surgeon: Irene Limbo, MD;  Location: [place];  Service: Plastics;  Laterality: Right;  . BREAST REDUCTION SURGERY Left 06/23/2015   Procedure: MAMMARY REDUCTION  (BREAST) LEFT BREAST FOR SYMETRY (BILATERAL BREAST REDUCTION);  Surgeon: Irene Limbo, MD;  Location: [place];  Service: Plastics;  Laterality: Left;  . BREAST REDUCTION SURGERY Left 01/15/2016   Procedure: MAMMARY REDUCTION  (BREAST) LEFT;  Surgeon: Irene Limbo, MD;  Location: [place];  Service: Plastics;  Laterality: Left;  Marland Kitchen MASTOPEXY Left 01/15/2016   Procedure: POSSIBLE LEFT MASTOPEXY;  Surgeon: Irene Limbo, MD;  Location:  [place];  Service: Plastics;  Laterality: Left;  . PORT-A-CATH REMOVAL Right 01/15/2016   Procedure: REMOVAL OF RIGHT PORT-A-CATH;  Surgeon: Irene Limbo, MD;  Location: [place];  Service: Plastics;  Laterality: Right;  . PORTACATH PLACEMENT Right 12/16/2014   Procedure: INSERTION PORT-A-CATH WITH ULTRASOUND;  Surgeon: Rolm Bookbinder, MD;  Location: [place];  Service: General;  Laterality: Right;  . RADIOACTIVE SEED GUIDED PARTIAL MASTECTOMY WITH AXILLARY SENTINEL LYMPH NODE BIOPSY Right 06/15/2015   Procedure: RADIOACTIVE SEED GUIDED PARTIAL MASTECTOMY WITH AXILLARY SENTINEL LYMPH NODE BIOPSY;  Surgeon: Rolm Bookbinder, MD;  Location: [place];  Service: General;  Laterality: Right;  . REDUCTION MAMMAPLASTY Bilateral 2017    There were no vitals filed for this visit.  Subjective Assessment - 08/02/18 0813    Subjective  My shoulder is achy today. The rainy weather makes it worse.     Pertinent History  Rt lumpectomy and breast reduction in 2017 with SLNB.  6 lymph nodes removed all negative. A second surgery bilaterally to adjust size reduction. Completed radiation and chemotherapy.  Rt sided lymphedema and breast lymphedema     Limitations  Lifting    Patient Stated Goals  decrease the Rt shoulder pain    Currently in Pain?  Yes    Pain Score  4     Pain Location  Shoulder    Pain Orientation  Right  [facility] Adult PT Treatment/Exercise - 08/02/18 0001      Shoulder Exercises: Seated   External Rotation  Strengthening;Right;Left;10 reps;Theraband    Theraband Level (Shoulder External Rotation)  Level 2 (Red)      Shoulder Exercises: Standing   Other Standing Exercises  slide elbows up the wall without and with red band    Other Standing Exercises  leaning against wall pulling forearm back with red band around forearm x 10      Shoulder Exercises: Pulleys   Flexion  2 minutes    ABduction  2  minutes      Manual Therapy   Manual Therapy  Soft tissue mobilization;Myofascial release;Passive ROM    Soft tissue mobilization  in left sidelying and with thick massage cream, soft tissue work to tight muscles in posterior shoulder and scapular area with prolonged pressure at trigger points     Myofascial Release  extra work for myofascial release to tight trigger point and subscapularis muscle in both supine and sidelying                   PT Long Term Goals - 07/18/18 2122      PT LONG TERM GOAL #1   Title  Pt will report the pain and discomfort in her right upper quadant is decreased by 50%    Baseline  7/10    Time  6    Period  Weeks    Status  New      PT LONG TERM GOAL #2   Title  Pt will report she will be able to use the compression pump to help with her symptoms of lymphedema in her right upper quadrant     Baseline  waiting for pump    Time  6    Period  Weeks      PT LONG TERM GOAL #3   Title  Pt will improve Rt shoulder AROM to painfree and WNL    Time  6    Period  Weeks    Status  New      PT LONG TERM GOAL #4   Title  Pt will be independent in a strengthening program to right shoulder     Status  New      PT LONG TERM GOAL #5   Title  Pt will decrease QDASH to 25% or less    Baseline  76%    Time  6    Period  Weeks    Status  New            Plan - 08/02/18 7564    Clinical Impression Statement  Pt reports she has the pulleys at home and will continue those over the next week. Printed off all of pt's current exercises so she can do these over the next 2 weeks while [facility] is closed due to coronavirus. Continued with STM to tight periscapular musculature today following exercises.     Rehab Potential  Fair    PT Frequency  2x / week    PT Duration  6 weeks    PT Treatment/Interventions  ADLs/Self Care Home Management;Iontophoresis 26m/ml Dexamethasone;Electrical Stimulation;Therapeutic exercise;Patient/family education;Manual  techniques;Manual lymph drainage;Passive range of motion;Taping    PT Next Visit Plan  Rt shoulderPROM/STM scapular muscles/region assessment, scapular strengthening/impingment type treatment .  if limited improvement ?? dry needling??    PT Home Exercise Plan   [id number]    [id number] Agree with Plan of Care  Patient  Patient will benefit from skilled therapeutic intervention in order to improve the following deficits and impairments:     Visit Diagnosis: Stiffness of right shoulder, not elsewhere classified  Right shoulder pain, unspecified chronicity  Abnormal posture  Muscle weakness (generalized)     Problem List Patient Active Problem List   Diagnosis Date Noted  . Port catheter in place 12/28/2015  . Chemotherapy induced neutropenia ([place]) 05/14/2015  . Chemotherapy-induced neuropathy ([place]) 04/08/2015  . 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 Tooth ache 01/09/2015  . Genetic testing 12/26/2014  . Family history of breast cancer   . Malignant neoplasm of lower-inner quadrant of right breast of female, estrogen receptor negative ([place]) 11/25/2014  . Migraine 11/19/2014    Allyson Sabal [facility] 08/02/2018, 12:19 PM  [place], [place], [address] Phone: [phone]   Fax:  [phone]  Name: Susan Wilson MRN: [id number] Date of Birth: 1976-04-30  Manus Gunning, PT 08/02/18 12:19 PM

## 2018-08-06 ENCOUNTER — Encounter: Payer: Medicare Other | Admitting: Rehabilitation

## 2018-08-08 ENCOUNTER — Encounter: Payer: Medicare Other | Admitting: Rehabilitation

## 2018-08-14 ENCOUNTER — Telehealth: Payer: Self-pay | Admitting: Physical Therapy

## 2018-08-16 ENCOUNTER — Ambulatory Visit: Payer: Medicare Other | Admitting: Physical Therapy

## 2018-08-20 ENCOUNTER — Telehealth: Payer: Self-pay | Admitting: Physical Therapy

## 2018-08-20 NOTE — Telephone Encounter (Signed)
Left another message for pt stating we were calling to check in with her.  She has an appt with [place] tomorrow at 10:00 so documentation for her compression pump will be done then.  Maudry Diego, PT 08/20/2018@ 12:43 PM

## 2018-08-21 ENCOUNTER — Ambulatory Visit: Payer: Medicare Other | Admitting: Rehabilitation

## 2018-08-22 NOTE — Telephone Encounter (Signed)
This note is in error

## 2018-08-23 ENCOUNTER — Encounter: Payer: Medicare Other | Admitting: Physical Therapy

## 2018-08-24 ENCOUNTER — Encounter: Payer: Self-pay | Admitting: Rehabilitation

## 2018-08-24 ENCOUNTER — Other Ambulatory Visit: Payer: Self-pay

## 2018-08-24 ENCOUNTER — Ambulatory Visit: Payer: Medicare Other | Attending: Oncology | Admitting: Rehabilitation

## 2018-08-24 DIAGNOSIS — I890 Lymphedema, not elsewhere classified: Secondary | ICD-10-CM | POA: Insufficient documentation

## 2018-08-24 DIAGNOSIS — M25511 Pain in right shoulder: Secondary | ICD-10-CM | POA: Insufficient documentation

## 2018-08-24 DIAGNOSIS — M25611 Stiffness of right shoulder, not elsewhere classified: Secondary | ICD-10-CM

## 2018-08-24 DIAGNOSIS — R293 Abnormal posture: Secondary | ICD-10-CM | POA: Diagnosis not present

## 2018-08-24 DIAGNOSIS — M6281 Muscle weakness (generalized): Secondary | ICD-10-CM | POA: Diagnosis not present

## 2018-08-24 NOTE — Therapy (Addendum)
[place], [place], [address] Phone: [phone]   Fax:  [phone]  Physical Therapy Treatment  Patient Details  Name: Susan Wilson MRN: [id number] Date of Birth: [birth year] Referring Provider (PT): Dr. Jana Hakim   Encounter Date: 08/24/2018  PT End of Session - 08/24/18 1025    Visit Number  5    Number of Visits  13    Date for PT Re-Evaluation  08/29/18    Authorization Type  Medicare    PT Start Time  1003    PT Stop Time  1020    PT Time Calculation (min)  17 min    Activity Tolerance  Patient tolerated treatment well    Behavior During Therapy  [facility] for tasks assessed/performed       Past Medical History:  Diagnosis Date  . Anemia   . Breast asymmetry 12/2015  . Breast cancer ([place]) 2017   right breast  . History of breast cancer 11/2014   right  . History of chemotherapy   . Migraines   . 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 Personal history of chemotherapy 2017  . Personal history of radiation therapy 2017    Past Surgical History:  Procedure Laterality Date  . BREAST LUMPECTOMY Right 2017  . BREAST RECONSTRUCTION Right 06/23/2015   Procedure: ONCOPLASTIC RIGHT BREAST REDUCTION;  Surgeon: Irene Limbo, MD;  Location: [place];  Service: Plastics;  Laterality: Right;  . BREAST REDUCTION SURGERY Left 06/23/2015   Procedure: MAMMARY REDUCTION  (BREAST) LEFT BREAST FOR SYMETRY (BILATERAL BREAST REDUCTION);  Surgeon: Irene Limbo, MD;  Location: [place];  Service: Plastics;  Laterality: Left;  . BREAST REDUCTION SURGERY Left 01/15/2016   Procedure: MAMMARY REDUCTION  (BREAST) LEFT;  Surgeon: Irene Limbo, MD;  Location: [place];  Service: Plastics;  Laterality: Left;  Marland Kitchen MASTOPEXY Left 01/15/2016   Procedure: POSSIBLE LEFT MASTOPEXY;  Surgeon: Irene Limbo, MD;  Location: [place];  Service: Plastics;  Laterality: Left;  . PORT-A-CATH REMOVAL Right 01/15/2016   Procedure: REMOVAL OF RIGHT PORT-A-CATH;  Surgeon: Irene Limbo, MD;  Location: [place];  Service: Plastics;  Laterality: Right;  . PORTACATH PLACEMENT Right 12/16/2014   Procedure: INSERTION PORT-A-CATH WITH ULTRASOUND;  Surgeon: Rolm Bookbinder, MD;  Location: [place];  Service: General;  Laterality: Right;  . RADIOACTIVE SEED GUIDED PARTIAL MASTECTOMY WITH AXILLARY SENTINEL LYMPH NODE BIOPSY Right 06/15/2015   Procedure: RADIOACTIVE SEED GUIDED PARTIAL MASTECTOMY WITH AXILLARY SENTINEL LYMPH NODE BIOPSY;  Surgeon: Rolm Bookbinder, MD;  Location: [place];  Service: General;  Laterality: Right;  . REDUCTION MAMMAPLASTY Bilateral 2017    There were no vitals filed for this visit.  Subjective Assessment - 08/24/18 1024    Subjective  Here to get measured for the pump.  Doing well at home.      Pertinent History  Rt lumpectomy and breast reduction in 2017 with SLNB.  6 lymph nodes removed all negative. A second surgery bilaterally to adjust size reduction. Completed radiation and chemotherapy.  Rt sided lymphedema and breast lymphedema     Patient Stated Goals  decrease the Rt shoulder pain    Currently in Pain?  Yes    Pain Score  3     Pain Location  Shoulder    Pain Orientation  Right    Pain Descriptors / Indicators  Spasm;Hervey [place]d  Pain Type  Chronic pain    Aggravating Factors   reaching    Pain Relieving Factors  compression            LYMPHEDEMA/ONCOLOGY QUESTIONNAIRE - 08/24/18 1006      Right Upper Extremity Lymphedema   15 cm Proximal to Olecranon Process  40.8 cm    10 cm Proximal to Olecranon Process  39 cm    Olecranon Process  30.5 cm    10 cm Proximal to Ulnar Styloid Process  26 cm    Just Proximal to Ulnar Styloid Process  18.3 cm    Across Hand at [employer]  22.2 cm    At [place] of 2nd Digit  6.7 cm      Left Upper Extremity  Lymphedema   15 cm Proximal to Olecranon Process  41 cm    10 cm Proximal to Olecranon Process  40 cm    Olecranon Process  31.5 cm    10 cm Proximal to Ulnar Styloid Process  26 cm    Just Proximal to Ulnar Styloid Process  18.5 cm    Across Hand at [employer]  21.1 cm    At [place] of 2nd Digit  6.8 cm                [facility] Adult PT Treatment/Exercise - 08/24/18 0001      Manual Therapy   Manual therapy comments  gave pt small 1/2" gray foam pad for bra for reported tender incisions                  PT Long Term Goals - 07/18/18 2122      PT LONG TERM GOAL #1   Title  Pt will report the pain and discomfort in her right upper quadant is decreased by 50%    Baseline  7/10    Time  6    Period  Weeks    Status  New      PT LONG TERM GOAL #2   Title  Pt will report she will be able to use the compression pump to help with her symptoms of lymphedema in her right upper quadrant     Baseline  waiting for pump    Time  6    Period  Weeks      PT LONG TERM GOAL #3   Title  Pt will improve Rt shoulder AROM to painfree and WNL    Time  6    Period  Weeks    Status  New      PT LONG TERM GOAL #4   Title  Pt will be independent in a strengthening program to right shoulder     Status  New      PT LONG TERM GOAL #5   Title  Pt will decrease QDASH to 25% or less    Baseline  76%    Time  6    Period  Weeks    Status  New            Plan - 08/24/18 1026    Clinical Impression Statement  Pt arrives today to get remeasured for pump documentation.  Pt with circumferential measurements the same to slightly larger in the Rt UE despite working on it at home.  Pt continues with shoulder pain, UE discomfort, and incisions discomfort due to axillary and UE swelling.      PT Next Visit Plan  Rt shoulderPROM/STM scapular  muscles/region assessment, scapular strengthening/impingment type treatment .  if limited improvement ?? dry needling??    Consulted and Agree  with Plan of Care  Patient      Would benefit form a compression pump due to conservative measures not addressing deficits. Patient has tried and failed 4 or more weeks of compression, exercise and elevation in order to prevent hyperplasia and skin changes    Patient will benefit from skilled therapeutic intervention in order to improve the following deficits and impairments:     Visit Diagnosis: Stiffness of right shoulder, not elsewhere classified  Right shoulder pain, unspecified chronicity  Abnormal posture  Muscle weakness (generalized)  Lymphedema, not elsewhere classified     Problem List Patient Active Problem List   Diagnosis Date Noted  . Port catheter in place 12/28/2015  . Chemotherapy induced neutropenia ([place]) 05/14/2015  . 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 Tooth ache 01/09/2015  . Genetic testing 12/26/2014  . Family history of breast cancer   . Malignant neoplasm of lower-inner quadrant of right breast of female, estrogen receptor negative ([place]) 11/25/2014  . Migraine 11/19/2014   Shan Levans, PT 08/24/2018, 10:30 AM  [place], [place], [address] Phone: [phone]   Fax:  [phone]  Name: Susan Wilson MRN: [id number] Date of Birth: 06-14-1975

## 2018-08-28 ENCOUNTER — Encounter: Payer: Medicare Other | Admitting: Physical Therapy

## 2018-08-30 ENCOUNTER — Encounter: Payer: Medicare Other | Admitting: Physical Therapy

## 2018-09-04 ENCOUNTER — Encounter: Payer: Medicare Other | Admitting: Physical Therapy

## 2018-09-06 ENCOUNTER — Encounter: Payer: Medicare Other | Admitting: Physical Therapy

## 2018-09-11 ENCOUNTER — Encounter: Payer: Medicare Other | Admitting: Physical Therapy

## 2018-09-13 ENCOUNTER — Encounter: Payer: Medicare Other | Admitting: Physical Therapy

## 2018-09-19 ENCOUNTER — Ambulatory Visit: Payer: Medicare Other | Attending: Oncology

## 2018-09-19 ENCOUNTER — Other Ambulatory Visit: Payer: Self-pay

## 2018-09-19 DIAGNOSIS — R293 Abnormal posture: Secondary | ICD-10-CM | POA: Insufficient documentation

## 2018-09-19 DIAGNOSIS — M25511 Pain in right shoulder: Secondary | ICD-10-CM

## 2018-09-19 DIAGNOSIS — M6281 Muscle weakness (generalized): Secondary | ICD-10-CM | POA: Diagnosis not present

## 2018-09-19 DIAGNOSIS — M25611 Stiffness of right shoulder, not elsewhere classified: Secondary | ICD-10-CM | POA: Insufficient documentation

## 2018-09-19 DIAGNOSIS — I890 Lymphedema, not elsewhere classified: Secondary | ICD-10-CM | POA: Insufficient documentation

## 2018-09-19 NOTE — Therapy (Signed)
[place], [place], [address] Phone: [phone]   Fax:  [phone]  Physical Therapy Treatment  Patient Details  Name: Susan Wilson MRN: [id number] Date of Birth: [birth year] Referring Provider (PT): Dr. Jana Hakim   Encounter Date: 09/19/2018  PT End of Session - 09/19/18 0847    Visit Number  6    Number of Visits  21    Date for PT Re-Evaluation  10/17/18    Authorization Type  Medicare    PT Start Time  0804    PT Stop Time  0086    PT Time Calculation (min)  43 min    Activity Tolerance  Patient tolerated treatment well    Behavior During Therapy  [facility] for tasks assessed/performed       Past Medical History:  Diagnosis Date  . Anemia   . Breast asymmetry 12/2015  . Breast cancer ([place]) 2017   right breast  . History of breast cancer 11/2014   right  . History of chemotherapy   . Migraines   . 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 Personal history of chemotherapy 2017  . Personal history of radiation therapy 2017    Past Surgical History:  Procedure Laterality Date  . BREAST LUMPECTOMY Right 2017  . BREAST RECONSTRUCTION Right 06/23/2015   Procedure: ONCOPLASTIC RIGHT BREAST REDUCTION;  Surgeon: Irene Limbo, MD;  Location: [place];  Service: Plastics;  Laterality: Right;  . BREAST REDUCTION SURGERY Left 06/23/2015   Procedure: MAMMARY REDUCTION  (BREAST) LEFT BREAST FOR SYMETRY (BILATERAL BREAST REDUCTION);  Surgeon: Irene Limbo, MD;  Location: [place];  Service: Plastics;  Laterality: Left;  . BREAST REDUCTION SURGERY Left 01/15/2016   Procedure: MAMMARY REDUCTION  (BREAST) LEFT;  Surgeon: Irene Limbo, MD;  Location: [place];  Service: Plastics;  Laterality: Left;  Marland Kitchen MASTOPEXY Left 01/15/2016   Procedure: POSSIBLE LEFT MASTOPEXY;  Surgeon: Irene Limbo, MD;  Location: [place];  Service: Plastics;  Laterality: Left;  . PORT-A-CATH REMOVAL Right 01/15/2016   Procedure: REMOVAL OF RIGHT PORT-A-CATH;  Surgeon: Irene Limbo, MD;  Location: [place];  Service: Plastics;  Laterality: Right;  . PORTACATH PLACEMENT Right 12/16/2014   Procedure: INSERTION PORT-A-CATH WITH ULTRASOUND;  Surgeon: Rolm Bookbinder, MD;  Location: [place];  Service: General;  Laterality: Right;  . RADIOACTIVE SEED GUIDED PARTIAL MASTECTOMY WITH AXILLARY SENTINEL LYMPH NODE BIOPSY Right 06/15/2015   Procedure: RADIOACTIVE SEED GUIDED PARTIAL MASTECTOMY WITH AXILLARY SENTINEL LYMPH NODE BIOPSY;  Surgeon: Rolm Bookbinder, MD;  Location: [place];  Service: General;  Laterality: Right;  . REDUCTION MAMMAPLASTY Bilateral 2017    There were no vitals filed for this visit.  Subjective Assessment - 09/19/18 0817    Subjective  My Rt arm pain has improved some, but reaching in front of me (like folding sheets or reaching into cabinet) is still painful. My arm swelling still comes and goes. Haven't received the compression pump yet due to insurance.  Been wearing my compression sleeve/glove daily but can only wear for a few hours bc my fingers start to tingle and hurt.     Pertinent History  Rt lumpectomy and breast reduction in 2017 with SLNB.  6 lymph nodes removed all negative. A second surgery bilaterally to adjust size reduction. Completed radiation and chemotherapy.  Rt sided lymphedema and breast lymphedema     Patient  Stated Goals  decrease the Rt shoulder pain    Currently in Pain?  Yes    Pain Score  6     Pain Location  Shoulder    Pain Orientation  Right    Pain Descriptors / Indicators  Aching    Pain Type  Chronic pain    Pain Onset  More than a month ago    Pain Frequency  Constant    Aggravating Factors   reaching     Pain Relieving Factors  kinesiotape, wearing comppression sleeve/glove         [facility] PT Assessment - 09/19/18 0001      AROM    Right Shoulder Flexion  137 Degrees   just tight, no pain reported   Right Shoulder ABduction  167 Degrees    Right Shoulder Internal Rotation  86 Degrees   no discomfort   Right Shoulder External Rotation  82 Degrees           Quick Dash - 09/19/18 0001    Open a tight or new jar  Severe difficulty    Do heavy household chores (wash walls, wash floors)  Severe difficulty    Carry a shopping bag or briefcase  Moderate difficulty    Wash your back  Severe difficulty    Use a knife to cut food  Mild difficulty    Recreational activities in which you take some force or impact through your arm, shoulder, or hand (golf, hammering, tennis)  Moderate difficulty    During the past week, to what extent has your arm, shoulder or hand problem interfered with your normal social activities with family, friends, neighbors, or groups?  Modererately    During the past week, to what extent has your arm, shoulder or hand problem limited your work or other regular daily activities  Quite a bit    Arm, shoulder, or hand pain.  Severe    Tingling (pins and needles) in your arm, shoulder, or hand  Severe    Difficulty Sleeping  Moderate difficulty    DASH Score  61.36 %             [facility] Adult PT Treatment/Exercise - 09/19/18 0001      Manual Therapy   Manual Therapy  Myofascial release;Passive ROM;Manual Lymphatic Drainage (MLD)    Myofascial Release  To Rt chest wall, axilla, and UE pulling thoughout P/ROM    Manual Lymphatic Drainage (MLD)  In Supine: Short neck, 5 diaphramagtic breaths, Lt axillary and Rt inguinal nodes, anterior inter-axillary and Rt axillo-inguinal anastomosis then Rt UE working from proximal to distal then retracing all steps.     Passive ROM  In Supine to Rt shoulder into flexion, abduction and D2 to tolerance                  PT Long Term Goals - 09/19/18 0806      PT LONG TERM GOAL #1   Title  Pt will report the pain and discomfort in her right upper  quadant is decreased by 50%    Baseline  10% improvement at this time as she has been our of clinic for 2 months-09/19/18    Status  On-going      PT LONG TERM GOAL #2   Title  Pt will report she will be able to use the compression pump to help with her symptoms of lymphedema in her right upper quadrant     Baseline  waiting for pump; haven't  received pump yet due to insurance-09/19/18    Status  On-going      PT LONG TERM GOAL #3   Title  Pt will improve Rt shoulder AROM into flexion to 160 degrees.    Baseline  137 degrees-09/19/18    Status  On-going      PT LONG TERM GOAL #4   Title  Pt will be independent in a strengthening program to right shoulder     Baseline  Progressed HEP to include standing 3 way raises, pt independent with supine scapular series-09/11/17; pt now independent with all-10/10/17    Status  On-going      PT LONG TERM GOAL #5   Title  Pt will decrease Quick DASH to 25% or less    Baseline  76%-07/18/18; 61% - 09/19/18    Status  On-going            Plan - 09/19/18 0850    Clinical Impression Statement  Pt arrives today after not being seen in clinic for awhile due to [place] pandemic. Reassessed her goals and overall her A/ROM of Rt shoulder maintained or improved except her flexion decreased from 143 to 137 degrees and she reports this is most painful now, especially with OH reaching. Focused today on manual therapy of Rt upper quadrant and pts P/ROM was near full by end of session. Pt will benefit from continued therapy to address limitations with ROM, decreasing pain and assisting prn with pt getting compression pump.     Personal Factors and Comorbidities  Past/Current Experience;Time since onset of injury/illness/exacerbation    Examination-Activity Limitations  Carry;Lift;Reach Overhead    Examination-Participation Restrictions  Community Activity;Other   work   Stability/Clinical Decision Making  Stable/Uncomplicated    Rehab Potential  Fair    PT Frequency  2x  / week    PT Duration  4 weeks    PT Treatment/Interventions  ADLs/Self Care Home Management;Iontophoresis 74m/ml Dexamethasone;Electrical Stimulation;Therapeutic exercise;Patient/family education;Manual techniques;Manual lymph drainage;Passive range of motion;Taping    PT Next Visit Plan  Rt shoulder PROM/STM scapular muscles/region assessment, MLD to Rt UE prn and scapular strengthening/impingment type treatment .  if limited improvement ?? dry needling??    Consulted and Agree with Plan of Care  Patient       Patient will benefit from skilled therapeutic intervention in order to improve the following deficits and impairments:  Decreased activity tolerance, Decreased range of motion, Decreased strength, Pain, Increased edema  Visit Diagnosis: Stiffness of right shoulder, not elsewhere classified  Right shoulder pain, unspecified chronicity  Abnormal posture  Muscle weakness (generalized)  Lymphedema, not elsewhere classified     Problem List Patient Active Problem List   Diagnosis Date Noted  . Port catheter in place 12/28/2015  . Chemotherapy induced neutropenia ([place] 05/14/2015  . Chemotherapy-induced neuropathy ([facility] 04/08/2015  . 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 Tooth ache 01/09/2015  . Genetic testing 12/26/2014  . Family history of breast cancer   . Malignant neoplasm of lower-inner quadrant of right breast of female, estrogen receptor negative ([place] 11/25/2014  . Migraine 11/19/2014    ROtelia Limes PTA 09/19/2018, 8:54 AM  [place] [address]Phone: [phone]  Fax:  [phone] Name: Susan REILANDMRN: [id number] of Birth: 1Oct 07, 1977

## 2018-09-26 ENCOUNTER — Ambulatory Visit: Payer: Medicare Other

## 2018-09-26 ENCOUNTER — Other Ambulatory Visit: Payer: Self-pay

## 2018-09-26 DIAGNOSIS — I890 Lymphedema, not elsewhere classified: Secondary | ICD-10-CM | POA: Diagnosis not present

## 2018-09-26 DIAGNOSIS — M25511 Pain in right shoulder: Secondary | ICD-10-CM | POA: Diagnosis not present

## 2018-09-26 DIAGNOSIS — M6281 Muscle weakness (generalized): Secondary | ICD-10-CM

## 2018-09-26 DIAGNOSIS — R293 Abnormal posture: Secondary | ICD-10-CM

## 2018-09-26 DIAGNOSIS — M25611 Stiffness of right shoulder, not elsewhere classified: Secondary | ICD-10-CM | POA: Diagnosis not present

## 2018-09-26 NOTE — Therapy (Signed)
[place], [place], [address] Phone: [phone]   Fax:  [phone]  Physical Therapy Treatment  Patient Details  Name: Susan Wilson MRN: [id number] Date of Birth: 1976-03-27 Referring Provider (PT): Dr. Jana Hakim   Encounter Date: 09/26/2018  PT End of Session - 09/26/18 1159    Visit Number  7    Number of Visits  21    Date for PT Re-Evaluation  10/17/18    PT Start Time  1100    PT Stop Time  1152    PT Time Calculation (min)  52 min    Activity Tolerance  Patient tolerated treatment well    Behavior During Therapy  [facility] for tasks assessed/performed       Past Medical History:  Diagnosis Date  . Anemia   . Breast asymmetry 12/2015  . Breast cancer ([place]) 2017   right breast  . History of breast cancer 11/2014   right  . History of chemotherapy   . Migraines   . 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 Personal history of chemotherapy 2017  . Personal history of radiation therapy 2017    Past Surgical History:  Procedure Laterality Date  . BREAST LUMPECTOMY Right 2017  . BREAST RECONSTRUCTION Right 06/23/2015   Procedure: ONCOPLASTIC RIGHT BREAST REDUCTION;  Surgeon: Irene Limbo, MD;  Location: [place];  Service: Plastics;  Laterality: Right;  . BREAST REDUCTION SURGERY Left 06/23/2015   Procedure: MAMMARY REDUCTION  (BREAST) LEFT BREAST FOR SYMETRY (BILATERAL BREAST REDUCTION);  Surgeon: Irene Limbo, MD;  Location: [place];  Service: Plastics;  Laterality: Left;  . BREAST REDUCTION SURGERY Left 01/15/2016   Procedure: MAMMARY REDUCTION  (BREAST) LEFT;  Surgeon: Irene Limbo, MD;  Location: [place];  Service: Plastics;  Laterality: Left;  Marland Kitchen MASTOPEXY Left 01/15/2016   Procedure: POSSIBLE LEFT MASTOPEXY;  Surgeon: Irene Limbo, MD;  Location: [place];  Service: Plastics;   Laterality: Left;  . PORT-A-CATH REMOVAL Right 01/15/2016   Procedure: REMOVAL OF RIGHT PORT-A-CATH;  Surgeon: Irene Limbo, MD;  Location: [place];  Service: Plastics;  Laterality: Right;  . PORTACATH PLACEMENT Right 12/16/2014   Procedure: INSERTION PORT-A-CATH WITH ULTRASOUND;  Surgeon: Rolm Bookbinder, MD;  Location: [place];  Service: General;  Laterality: Right;  . RADIOACTIVE SEED GUIDED PARTIAL MASTECTOMY WITH AXILLARY SENTINEL LYMPH NODE BIOPSY Right 06/15/2015   Procedure: RADIOACTIVE SEED GUIDED PARTIAL MASTECTOMY WITH AXILLARY SENTINEL LYMPH NODE BIOPSY;  Surgeon: Rolm Bookbinder, MD;  Location: [place];  Service: General;  Laterality: Right;  . REDUCTION MAMMAPLASTY Bilateral 2017    There were no vitals filed for this visit.  Subjective Assessment - 09/26/18 1105    Subjective  My Rt arm has been achy a little I think just from the weather. I was a little sore after last visit and I can't find my kinesiotape at home so maybe you can do that for me today.     Pertinent History  Rt lumpectomy and breast reduction in 2017 with SLNB.  6 lymph nodes removed all negative. A second surgery bilaterally to adjust size reduction. Completed radiation and chemotherapy.  Rt sided lymphedema and breast lymphedema     Patient Stated Goals  decrease the Rt shoulder pain    Currently in Pain?  Yes    Pain Score  5     Pain Location  Arm    Pain Orientation  Right    Pain Descriptors / Indicators  Aching    Pain Type  Chronic pain    Pain Onset  More than a month ago    Pain Frequency  Intermittent    Aggravating Factors   cold weather    Pain Relieving Factors  kinesiotape, wearing compression sleeve/glove                       [facility] Adult PT Treatment/Exercise - 09/26/18 0001      Shoulder Exercises: Pulleys   Flexion  2 minutes    ABduction  2 minutes      Shoulder Exercises: Therapy Ball   Flexion  15 reps    ABduction  Right;10  reps      Manual Therapy   Manual Therapy  Myofascial release;Passive ROM;Manual Lymphatic Drainage (MLD);Taping    Myofascial Release  To Rt chest wall, axilla, and UE pulling thoughout P/ROM    Manual Lymphatic Drainage (MLD)  In Supine: Short neck, 5 diaphramagtic breaths, Lt axillary and Rt inguinal nodes, anterior inter-axillary and Rt axillo-inguinal anastomosis then Rt UE working from proximal to distal then retracing all steps.     Passive ROM  In Supine to Rt shoulder into flexion, abduction and D2 to tolerance    Kinesiotex  Edema      Kinesiotix   Edema  kinesiotape along Rt axillo-inguinal anastomosis, in fan shape with 3 prongs towards and onto posterior upper arm and bucket at Rt waist                   PT Long Term Goals - 09/19/18 0806      PT LONG TERM GOAL #1   Title  Pt will report the pain and discomfort in her right upper quadant is decreased by 50%    Baseline  10% improvement at this time as she has been our of clinic for 2 months-09/19/18    Status  On-going      PT LONG TERM GOAL #2   Title  Pt will report she will be able to use the compression pump to help with her symptoms of lymphedema in her right upper quadrant     Baseline  waiting for pump; haven't received pump yet due to insurance-09/19/18    Status  On-going      PT LONG TERM GOAL #3   Title  Pt will improve Rt shoulder AROM into flexion to 160 degrees.    Baseline  137 degrees-09/19/18    Status  On-going      PT LONG TERM GOAL #4   Title  Pt will be independent in a strengthening program to right shoulder     Baseline  Progressed HEP to include standing 3 way raises, pt independent with supine scapular series-09/11/17; pt now independent with all-10/10/17    Status  On-going      PT LONG TERM GOAL #5   Title  Pt will decrease Quick DASH to 25% or less    Baseline  76%-07/18/18; 61% - 09/19/18    Status  On-going            Plan - 09/26/18 1200    Clinical Impression Statement   Progressed pt to inculde AA/ROM exercises today which she tolerated well. She reports weather has been causing achiness in her arm (cool and rainy). She had some soreness after last session just from being stretched but reports benefit  so continued with this as well. Pt reports she had been using kinesiotape at home but hasn't been able to find it so requested this today. See flowsheet for application. Pt reported achiness subsided in her arm at end of session.     Personal Factors and Comorbidities  Past/Current Experience;Time since onset of injury/illness/exacerbation    Examination-Activity Limitations  Carry;Lift;Reach Overhead    Examination-Participation Restrictions  Community Activity;Other   work   Stability/Clinical Decision Making  Stable/Uncomplicated    Rehab Potential  Fair    PT Frequency  2x / week    PT Duration  4 weeks    PT Treatment/Interventions  ADLs/Self Care Home Management;Iontophoresis 18m/ml Dexamethasone;Electrical Stimulation;Therapeutic exercise;Patient/family education;Manual techniques;Manual lymph drainage;Passive range of motion;Taping    PT Next Visit Plan  Rt shoulder PROM/STM scapular muscles/region assessment, MLD to Rt UE prn and scapular strengthening/impingment type treatment, assess and cont kinesiotape prn;  if limited improvement ?? dry needling??    Consulted and Agree with Plan of Care  Patient       Patient will benefit from skilled therapeutic intervention in order to improve the following deficits and impairments:  Decreased activity tolerance, Decreased range of motion, Decreased strength, Pain, Increased edema  Visit Diagnosis: Stiffness of right shoulder, not elsewhere classified  Right shoulder pain, unspecified chronicity  Abnormal posture  Muscle weakness (generalized)  Lymphedema, not elsewhere classified     Problem List Patient Active Problem List   Diagnosis Date Noted  . Port catheter in place 12/28/2015  . Chemotherapy  induced neutropenia ([place] 05/14/2015  . Chemotherapy-induced neuropathy ([facility] 04/08/2015  . 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 Tooth ache 01/09/2015  . Genetic testing 12/26/2014  . Family history of breast cancer   . Malignant neoplasm of lower-inner quadrant of right breast of female, estrogen receptor negative ([place] 11/25/2014  . Migraine 11/19/2014    ROtelia Limes PTA 09/26/2018, 12:05 PM  [place] [address]Phone: [phone]  Fax:  [phone] Name: Susan BOHLENMRN: [id number] of Birth: [birth year]

## 2018-10-01 ENCOUNTER — Ambulatory Visit: Payer: Medicare Other | Admitting: Physical Therapy

## 2018-10-01 ENCOUNTER — Encounter: Payer: Self-pay | Admitting: Physical Therapy

## 2018-10-01 ENCOUNTER — Other Ambulatory Visit: Payer: Self-pay

## 2018-10-01 DIAGNOSIS — M25611 Stiffness of right shoulder, not elsewhere classified: Secondary | ICD-10-CM

## 2018-10-01 DIAGNOSIS — R293 Abnormal posture: Secondary | ICD-10-CM | POA: Diagnosis not present

## 2018-10-01 DIAGNOSIS — M6281 Muscle weakness (generalized): Secondary | ICD-10-CM

## 2018-10-01 DIAGNOSIS — M25511 Pain in right shoulder: Secondary | ICD-10-CM

## 2018-10-01 DIAGNOSIS — I890 Lymphedema, not elsewhere classified: Secondary | ICD-10-CM | POA: Diagnosis not present

## 2018-10-01 NOTE — Therapy (Signed)
[place], [place], [address] Phone: [phone]   Fax:  [phone]  Physical Therapy Treatment  Patient Details  Name: Susan Wilson MRN: [id number] Date of Birth: 1976/04/28 Referring Provider (PT): Dr. Jana Hakim   Encounter Date: 10/01/2018  PT End of Session - 10/01/18 1357    Visit Number  8    Number of Visits  21    Date for PT Re-Evaluation  10/17/18    PT Start Time  1300    PT Stop Time  1345    PT Time Calculation (min)  45 min    Activity Tolerance  Patient tolerated treatment well    Behavior During Therapy  [facility] for tasks assessed/performed       Past Medical History:  Diagnosis Date  . Anemia   . Breast asymmetry 12/2015  . Breast cancer ([place]) 2017   right breast  . History of breast cancer 11/2014   right  . History of chemotherapy   . Migraines   . 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 Personal history of chemotherapy 2017  . Personal history of radiation therapy 2017    Past Surgical History:  Procedure Laterality Date  . BREAST LUMPECTOMY Right 2017  . BREAST RECONSTRUCTION Right 06/23/2015   Procedure: ONCOPLASTIC RIGHT BREAST REDUCTION;  Surgeon: Irene Limbo, MD;  Location: [place];  Service: Plastics;  Laterality: Right;  . BREAST REDUCTION SURGERY Left 06/23/2015   Procedure: MAMMARY REDUCTION  (BREAST) LEFT BREAST FOR SYMETRY (BILATERAL BREAST REDUCTION);  Surgeon: Irene Limbo, MD;  Location: [place];  Service: Plastics;  Laterality: Left;  . BREAST REDUCTION SURGERY Left 01/15/2016   Procedure: MAMMARY REDUCTION  (BREAST) LEFT;  Surgeon: Irene Limbo, MD;  Location: [place];  Service: Plastics;  Laterality: Left;  Marland Kitchen MASTOPEXY Left 01/15/2016   Procedure: POSSIBLE LEFT MASTOPEXY;  Surgeon: Irene Limbo, MD;  Location: [place];  Service: Plastics;   Laterality: Left;  . PORT-A-CATH REMOVAL Right 01/15/2016   Procedure: REMOVAL OF RIGHT PORT-A-CATH;  Surgeon: Irene Limbo, MD;  Location: [place];  Service: Plastics;  Laterality: Right;  . PORTACATH PLACEMENT Right 12/16/2014   Procedure: INSERTION PORT-A-CATH WITH ULTRASOUND;  Surgeon: Rolm Bookbinder, MD;  Location: [place];  Service: General;  Laterality: Right;  . RADIOACTIVE SEED GUIDED PARTIAL MASTECTOMY WITH AXILLARY SENTINEL LYMPH NODE BIOPSY Right 06/15/2015   Procedure: RADIOACTIVE SEED GUIDED PARTIAL MASTECTOMY WITH AXILLARY SENTINEL LYMPH NODE BIOPSY;  Surgeon: Rolm Bookbinder, MD;  Location: [place];  Service: General;  Laterality: Right;  . REDUCTION MAMMAPLASTY Bilateral 2017    There were no vitals filed for this visit.  Subjective Assessment - 10/01/18 1305    Subjective  Pt says she is a little achy today because of the weather. She has heard from [employer] and they are in process She will have to start out with the extremitly pump.  She is still doing pulley and ball up the wall to stretch her shoulder.  She feels that the kinesiotape helps. The compression sleeve gets too hot that makes her swell more especially with the hand.     Pertinent History  Rt lumpectomy and breast reduction in 2017 with SLNB.  6 lymph nodes removed all negative. A second surgery bilaterally to adjust size reduction. Completed radiation and chemotherapy.  Rt sided lymphedema and breast lymphedema     Patient  Stated Goals  decrease the Rt shoulder pain    Currently in Pain?  Yes    Pain Score  3     Pain Location  Shoulder    Pain Orientation  Right    Pain Descriptors / Indicators  Aching    Pain Type  Chronic pain    Pain Radiating Towards  shoulder , axilla trunk , right arm     Pain Onset  More than a month ago    Pain Frequency  Intermittent    Aggravating Factors   weather     Pain Relieving Factors  kinesiotape, manual lymph drainage              LYMPHEDEMA/ONCOLOGY QUESTIONNAIRE - 10/01/18 1311      Right Upper Extremity Lymphedema   15 cm Proximal to Olecranon Process  41 cm    10 cm Proximal to Olecranon Process  39 cm    Olecranon Process  29.5 cm    15 cm Proximal to Ulnar Styloid Process  30.5 cm    10 cm Proximal to Ulnar Styloid Process  27.3 cm    Just Proximal to Ulnar Styloid Process  18.5 cm    Across Hand at [employer]  21.2 cm    At [place] of 2nd Digit  6.6 cm                [facility] Adult PT Treatment/Exercise - 10/01/18 0001      Manual Therapy   Manual Therapy  Edema management    Manual therapy comments  small dotted foam patch to right lateral trunk inside bra     Edema Management  right arm circumfrence taken.  Notices increase in forearm     Manual Lymphatic Drainage (MLD)  In Supine: Short neck, 5 diaphramagtic breaths, Lt axillary and Rt inguinal nodes, anterior inter-axillary and Rt axillo-inguinal anastomosis then Rt UE working from proximal to distal then retracing all steps.  then, to sidelying for posterior interaxially anastamosis     Kinesiotex  Edema      Kinesiotix   Edema  kinesiotape along Rt axillo-inguinal anastomosis, in fan shape with 3 prongs towards and onto posterior upper arm and bucket at Rt waist              PT Education - 10/01/18 1357    Education Details  use of foam patch at lateral chest     Person(s) Educated  Patient    Methods  Explanation    Comprehension  Verbalized understanding          PT Long Term Goals - 09/19/18 0806      PT LONG TERM GOAL #1   Title  Pt will report the pain and discomfort in her right upper quadant is decreased by 50%    Baseline  10% improvement at this time as she has been our of clinic for 2 months-09/19/18    Status  On-going      PT LONG TERM GOAL #2   Title  Pt will report she will be able to use the compression pump to help with her symptoms of lymphedema in her right upper quadrant     Baseline   waiting for pump; haven't received pump yet due to insurance-09/19/18    Status  On-going      PT LONG TERM GOAL #3   Title  Pt will improve Rt shoulder AROM into flexion to 160 degrees.    Baseline  137 degrees-09/19/18  Status  On-going      PT LONG TERM GOAL #4   Title  Pt will be independent in a strengthening program to right shoulder     Baseline  Progressed HEP to include standing 3 way raises, pt independent with supine scapular series-09/11/17; pt now independent with all-10/10/17    Status  On-going      PT LONG TERM GOAL #5   Title  Pt will decrease Quick DASH to 25% or less    Baseline  76%-07/18/18; 61% - 09/19/18    Status  On-going            Plan - 10/01/18 1357    Clinical Impression Statement  Pt reports her swelling comes and goes, and she has increased swelling in her arm and hand.  Circumference measures today show increase in forearm. Pt has been in communication with [employer] and is working out Copy.  Email sent today to find out more about the timing for delivery so we can plan future visits     Rehab Potential  Fair    PT Frequency  2x / week    PT Duration  4 weeks    PT Treatment/Interventions  ADLs/Self Care Home Management;Iontophoresis 55m/ml Dexamethasone;Electrical Stimulation;Therapeutic exercise;Patient/family education;Manual techniques;Manual lymph drainage;Passive range of motion;Taping    PT Next Visit Plan  Rt shoulder PROM/STM scapular muscles/region assessment, MLD to Rt UE prn and scapular strengthening/impingment type treatment, assess and cont kinesiotape prn;  if limited improvement ?? dry needling??       Patient will benefit from skilled therapeutic intervention in order to improve the following deficits and impairments:  Decreased activity tolerance, Decreased range of motion, Decreased strength, Pain, Increased edema  Visit Diagnosis: Stiffness of right shoulder, not elsewhere classified  Right shoulder pain, unspecified  chronicity  Abnormal posture  Muscle weakness (generalized)  Lymphedema, not elsewhere classified     Problem List Patient Active Problem List   Diagnosis Date Noted  . Port catheter in place 12/28/2015  . Chem[place]ropenia ([place] 05/14/2015  . Chemotherapy-induced neuropathy ([place] 04/08/2015  . 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 Tooth ache 01/09/2015  . Genetic testing 12/26/2014  . Family history of breast cancer   . Malignant neoplasm of lower-inner quadrant of right breast of female, estrogen receptor negative ([place] 11/25/2014  . Migraine 11/19/2014   TDonato Heinz BOwens SharkPT  BNorwood Levo5/18/2020, 2:00 PM  [place] [address]Phone: [phone]  Fax:  [phone] Name: Susan FRIEZEMRN: [id number] of Birth: [birth year]

## 2018-10-03 ENCOUNTER — Other Ambulatory Visit: Payer: Self-pay

## 2018-10-03 ENCOUNTER — Ambulatory Visit: Payer: Medicare Other

## 2018-10-03 DIAGNOSIS — M25611 Stiffness of right shoulder, not elsewhere classified: Secondary | ICD-10-CM

## 2018-10-03 DIAGNOSIS — M6281 Muscle weakness (generalized): Secondary | ICD-10-CM | POA: Diagnosis not present

## 2018-10-03 DIAGNOSIS — R293 Abnormal posture: Secondary | ICD-10-CM

## 2018-10-03 DIAGNOSIS — M25511 Pain in right shoulder: Secondary | ICD-10-CM

## 2018-10-03 DIAGNOSIS — I890 Lymphedema, not elsewhere classified: Secondary | ICD-10-CM

## 2018-10-03 NOTE — Therapy (Signed)
[place], [place], [address] Phone: [phone]   Fax:  [phone]  Physical Therapy Treatment  Patient Details  Name: Susan Wilson MRN: [id number] Date of Birth: July 14, 1975 Referring Provider (PT): Dr. Jana Hakim   Encounter Date: 10/03/2018  PT End of Session - 10/03/18 1157    Visit Number  9    Number of Visits  21    Date for PT Re-Evaluation  10/17/18    PT Start Time  1102    PT Stop Time  1147    PT Time Calculation (min)  45 min    Activity Tolerance  Patient tolerated treatment well    Behavior During Therapy  [facility] for tasks assessed/performed       Past Medical History:  Diagnosis Date  . Anemia   . Breast asymmetry 12/2015  . Breast cancer ([place]) 2017   right breast  . History of breast cancer 11/2014   right  . History of chemotherapy   . Migraines   . 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 Personal history of chemotherapy 2017  . Personal history of radiation therapy 2017    Past Surgical History:  Procedure Laterality Date  . BREAST LUMPECTOMY Right 2017  . BREAST RECONSTRUCTION Right 06/23/2015   Procedure: ONCOPLASTIC RIGHT BREAST REDUCTION;  Surgeon: Irene Limbo, MD;  Location: [place];  Service: Plastics;  Laterality: Right;  . BREAST REDUCTION SURGERY Left 06/23/2015   Procedure: MAMMARY REDUCTION  (BREAST) LEFT BREAST FOR SYMETRY (BILATERAL BREAST REDUCTION);  Surgeon: Irene Limbo, MD;  Location: [place];  Service: Plastics;  Laterality: Left;  . BREAST REDUCTION SURGERY Left 01/15/2016   Procedure: MAMMARY REDUCTION  (BREAST) LEFT;  Surgeon: Irene Limbo, MD;  Location: [place];  Service: Plastics;  Laterality: Left;  Marland Kitchen MASTOPEXY Left 01/15/2016   Procedure: POSSIBLE LEFT MASTOPEXY;  Surgeon: Irene Limbo, MD;  Location: [place];  Service: Plastics;   Laterality: Left;  . PORT-A-CATH REMOVAL Right 01/15/2016   Procedure: REMOVAL OF RIGHT PORT-A-CATH;  Surgeon: Irene Limbo, MD;  Location: [place];  Service: Plastics;  Laterality: Right;  . PORTACATH PLACEMENT Right 12/16/2014   Procedure: INSERTION PORT-A-CATH WITH ULTRASOUND;  Surgeon: Rolm Bookbinder, MD;  Location: [place];  Service: General;  Laterality: Right;  . RADIOACTIVE SEED GUIDED PARTIAL MASTECTOMY WITH AXILLARY SENTINEL LYMPH NODE BIOPSY Right 06/15/2015   Procedure: RADIOACTIVE SEED GUIDED PARTIAL MASTECTOMY WITH AXILLARY SENTINEL LYMPH NODE BIOPSY;  Surgeon: Rolm Bookbinder, MD;  Location: [place];  Service: General;  Laterality: Right;  . REDUCTION MAMMAPLASTY Bilateral 2017    There were no vitals filed for this visit.  Subjective Assessment - 10/03/18 1125    Subjective  My Rt shouldre felt better after last visit. Just a little achy today. I heard from [employer] and they said pump will ship this week or next.     Pertinent History  Rt lumpectomy and breast reduction in 2017 with SLNB.  6 lymph nodes removed all negative. A second surgery bilaterally to adjust size reduction. Completed radiation and chemotherapy.  Rt sided lymphedema and breast lymphedema     Patient Stated Goals  decrease the Rt shoulder pain    Currently in Pain?  Yes    Pain Score  3     Pain Location  Shoulder    Pain Orientation  Right    Pain  Descriptors / Indicators  Aching    Pain Onset  More than a month ago    Pain Frequency  Intermittent    Aggravating Factors   weather    Pain Relieving Factors  kinesiotape, manual lymph drainage         [facility] PT Assessment - 10/03/18 0001      AROM   Right Shoulder Flexion  155 Degrees    Right Shoulder ABduction  170 Degrees                   [facility] Adult PT Treatment/Exercise - 10/03/18 0001      Shoulder Exercises: Pulleys   Flexion  2 minutes    ABduction  2 minutes      Shoulder  Exercises: Therapy Ball   Flexion  10 reps   with forward lean into end of stretch   ABduction  Right;10 reps   same side lean into end of stretch     Manual Therapy   Manual Lymphatic Drainage (MLD)  In Supine: Short neck, 5 diaphramagtic breaths, Lt axillary and Rt inguinal nodes, anterior inter-axillary and Rt axillo-inguinal anastomosis then Rt UE working from proximal to distal then retracing all steps.  then, to sidelying for posterior interaxially anastamosis     Passive ROM  In Supine to Rt shoulder into flexion, abduction and D2 to tolerance                  PT Long Term Goals - 10/03/18 1117      PT LONG TERM GOAL #1   Title  Pt will report the pain and discomfort in her right upper quadant is decreased by 50%    Baseline  10% improvement at this time as she has been our of clinic for 2 months-09/19/18; 30% - 10/03/18    Status  On-going      PT LONG TERM GOAL #2   Title  Pt will report she will be able to use the compression pump to help with her symptoms of lymphedema in her right upper quadrant     Baseline  waiting for pump; haven't received pump yet due to insurance-09/19/18; pt has spoken with [employer] rep and they said it is shipping this week or next-10/03/18    Status  Partially Met      PT LONG TERM GOAL #3   Title  Pt will improve Rt shoulder AROM into flexion to 160 degrees.    Baseline  137 degrees-09/19/18; 155 degrees - 10/03/18    Status  On-going      PT LONG TERM GOAL #4   Title  Pt will be independent in a strengthening program to right shoulder     Baseline  Progressed HEP to include standing 3 way raises, pt independent with supine scapular series-09/11/17; pt now independent with all-10/10/17; pt reports doing HEP for strengthening 3-4x/wk and being independent with this-10/03/18    Status  Achieved      PT LONG TERM GOAL #5   Title  Pt will decrease Quick DASH to 25% or less    Baseline  76%-07/18/18; 61% - 09/19/18    Status  On-going             Plan - 10/03/18 1158    Clinical Impression Statement  Pt overall is progressing very well. She does report intermittent swelling, especially with cvhanges in the weather but reminded pt that with lymphedema this is to be expected. Her compression pump will be  shipped in next week or 2 (shipping is delayed per company right now due to [place] pandemic) but pt is very excited about receiving compression pump. Her A/ROM measurements have improved well since last measured and she reports noticing achiness in Rt shoulder improving as well.     Personal Factors and Comorbidities  Past/Current Experience;Time since onset of injury/illness/exacerbation    Examination-Activity Limitations  Carry;Lift;Reach Overhead    Examination-Participation Restrictions  Community Activity;Other   work   Stability/Clinical Decision Making  Stable/Uncomplicated    Rehab Potential  Fair    PT Frequency  2x / week    PT Duration  4 weeks    PT Treatment/Interventions  ADLs/Self Care Home Management;Iontophoresis 65m/ml Dexamethasone;Electrical Stimulation;Therapeutic exercise;Patient/family education;Manual techniques;Manual lymph drainage;Passive range of motion;Taping    PT Next Visit Plan  Rt shoulder PROM/STM scapular muscles/region assessment, MLD to Rt UE prn and scapular strengthening/impingment type treatment, assess and cont kinesiotape prn;  if limited improvement ?? dry needling??    Consulted and Agree with Plan of Care  Patient       Patient will benefit from skilled therapeutic intervention in order to improve the following deficits and impairments:  Decreased activity tolerance, Decreased range of motion, Decreased strength, Pain, Increased edema  Visit Diagnosis: Stiffness of right shoulder, not elsewhere classified  Right shoulder pain, unspecified chronicity  Abnormal posture  Muscle weakness (generalized)  Lymphedema, not elsewhere classified     Problem List Patient Active  Problem List   Diagnosis Date Noted  . Port catheter in place 12/28/2015  . Chemotherapy induced neutropenia ([facility] 05/14/2015  . Chemotherapy-induced neuropathy ([facility] [place] 04/08/2015  . 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 Tooth ache 01/09/2015  . Genetic testing 12/26/2014  . Family history of breast cancer   . Malignant neoplasm of lower-inner quadrant of right breast of female, estrogen receptor negative ([place] [place] 11/25/2014  . Migraine 11/19/2014    ROtelia Limes PTA 10/03/2018, 12:01 PM  [place] [address]Phone: [phone]  Fax:  [phone] Name: CDOREAN HIEBERTMRN: [id number] of Birth: [birth year]

## 2018-10-09 ENCOUNTER — Other Ambulatory Visit: Payer: Self-pay

## 2018-10-09 ENCOUNTER — Encounter: Payer: Self-pay | Admitting: Rehabilitation

## 2018-10-09 ENCOUNTER — Ambulatory Visit: Payer: Medicare Other | Admitting: Rehabilitation

## 2018-10-09 DIAGNOSIS — M6281 Muscle weakness (generalized): Secondary | ICD-10-CM | POA: Diagnosis not present

## 2018-10-09 DIAGNOSIS — R293 Abnormal posture: Secondary | ICD-10-CM

## 2018-10-09 DIAGNOSIS — M25611 Stiffness of right shoulder, not elsewhere classified: Secondary | ICD-10-CM | POA: Diagnosis not present

## 2018-10-09 DIAGNOSIS — M25511 Pain in right shoulder: Secondary | ICD-10-CM

## 2018-10-09 DIAGNOSIS — I890 Lymphedema, not elsewhere classified: Secondary | ICD-10-CM | POA: Diagnosis not present

## 2018-10-09 NOTE — Therapy (Signed)
[place], [place], [address] Phone: [phone]   Fax:  [phone]  Physical Therapy Progress Note  Dates of Reporting Period: 07/18/18 to 10/09/18  See below    Physical Therapy Treatment  Patient Details  Name: Susan Wilson MRN: [id number] Date of Birth: 06-01-1975 Referring Provider (PT): Dr. Jana Hakim   Encounter Date: 10/09/2018  PT End of Session - 10/09/18 0952    Visit Number  10    Number of Visits  21    Date for PT Re-Evaluation  10/17/18    Authorization Type  Medicare    PT Start Time  1000    PT Stop Time  1050    PT Time Calculation (min)  50 min    Activity Tolerance  Patient tolerated treatment well    Behavior During Therapy  [facility] for tasks assessed/performed       Past Medical History:  Diagnosis Date  . Anemia   . Breast asymmetry 12/2015  . Breast cancer ([place]) 2017   right breast  . History of breast cancer 11/2014   right  . History of chemotherapy   . Migraines   . 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 Personal history of chemotherapy 2017  . Personal history of radiation therapy 2017    Past Surgical History:  Procedure Laterality Date  . BREAST LUMPECTOMY Right 2017  . BREAST RECONSTRUCTION Right 06/23/2015   Procedure: ONCOPLASTIC RIGHT BREAST REDUCTION;  Surgeon: Irene Limbo, MD;  Location: [place];  Service: Plastics;  Laterality: Right;  . BREAST REDUCTION SURGERY Left 06/23/2015   Procedure: MAMMARY REDUCTION  (BREAST) LEFT BREAST FOR SYMETRY (BILATERAL BREAST REDUCTION);  Surgeon: Irene Limbo, MD;  Location: [place];  Service: Plastics;  Laterality: Left;  . BREAST REDUCTION SURGERY Left 01/15/2016   Procedure: MAMMARY REDUCTION  (BREAST) LEFT;  Surgeon: Irene Limbo, MD;  Location: [place];  Service: Plastics;  Laterality: Left;  Marland Kitchen MASTOPEXY Left 01/15/2016    Procedure: POSSIBLE LEFT MASTOPEXY;  Surgeon: Irene Limbo, MD;  Location: [place];  Service: Plastics;  Laterality: Left;  . PORT-A-CATH REMOVAL Right 01/15/2016   Procedure: REMOVAL OF RIGHT PORT-A-CATH;  Surgeon: Irene Limbo, MD;  Location: [place];  Service: Plastics;  Laterality: Right;  . PORTACATH PLACEMENT Right 12/16/2014   Procedure: INSERTION PORT-A-CATH WITH ULTRASOUND;  Surgeon: Rolm Bookbinder, MD;  Location: [place];  Service: General;  Laterality: Right;  . RADIOACTIVE SEED GUIDED PARTIAL MASTECTOMY WITH AXILLARY SENTINEL LYMPH NODE BIOPSY Right 06/15/2015   Procedure: RADIOACTIVE SEED GUIDED PARTIAL MASTECTOMY WITH AXILLARY SENTINEL LYMPH NODE BIOPSY;  Surgeon: Rolm Bookbinder, MD;  Location: [place];  Service: General;  Laterality: Right;  . REDUCTION MAMMAPLASTY Bilateral 2017    There were no vitals filed for this visit.  Subjective Assessment - 10/09/18 0952    Subjective  Still no pump.  It has been really sore.  The tape is helping.  I have been taping myself.  I think a few more until I get my pump.  I am doing pulleys and things with my red band.      Pertinent History  Rt lumpectomy and breast reduction in 2017 with SLNB.  6 lymph nodes removed all negative. A second surgery bilaterally to adjust size reduction. Completed radiation and chemotherapy.  Rt sided lymphedema and breast lymphedema     Patient Stated  Goals  decrease the Rt shoulder pain    Currently in Pain?  Yes    Pain Score  3     Pain Location  Shoulder    Pain Orientation  Right    Pain Descriptors / Indicators  Aching    Pain Type  Chronic pain    Pain Onset  More than a month ago    Pain Frequency  Constant    Aggravating Factors   weather, swelling    Pain Relieving Factors  kinesiotape, MLD                       [facility] Adult PT Treatment/Exercise - 10/09/18 0001      Manual Therapy   Manual Lymphatic Drainage (MLD)   In Supine: Short neck, 5 diaphramagtic breaths superficial and deep abominals, Lt axillary and Rt inguinal nodes, anterior inter-axillary and Rt axillo-inguinal anastomosis then Rt UE working from proximal to distal then retracing all steps.  then, to sidelying for posterior interaxially anastamosis. Also Rt axillary circles and posterior upper arm  towards Rt axillary    Passive ROM  In Supine to Rt shoulder into flexion, abduction and D2 to tolerance      Kinesiotix   Edema  kinesiotape along Rt axillo-inguinal anastomosis, in fan shape with 3 prongs towards and onto posterior upper arm and bucket at Rt waist                   PT Long Term Goals - 10/09/18 1055      PT LONG TERM GOAL #1   Title  Pt will report the pain and discomfort in her right upper quadant is decreased by 50%    Baseline  10% improvement at this time as she has been our of clinic for 2 months-09/19/18; 30% - 10/03/18    Status  On-going      PT LONG TERM GOAL #2   Title  Pt will report she will be able to use the compression pump to help with her symptoms of lymphedema in her right upper quadrant     Baseline  waiting for pump; haven't received pump yet due to insurance-09/19/18; pt has spoken with [employer] rep and they said it is shipping this week or next-10/03/18    Status  Partially Met      PT LONG TERM GOAL #3   Title  Pt will improve Rt shoulder AROM into flexion to 160 degrees.    Baseline  137 degrees-09/19/18; 155 degrees - 10/03/18    Status  On-going      PT LONG TERM GOAL #4   Title  Pt will be independent in a strengthening program to right shoulder     Baseline  Progressed HEP to include standing 3 way raises, pt independent with supine scapular series-09/11/17; pt now independent with all-10/10/17; pt reports doing HEP for strengthening 3-4x/wk and being independent with this-10/03/18    Status  Achieved      PT LONG TERM GOAL #5   Title  Pt will decrease Quick DASH to 25% or less    Baseline   76%-07/18/18; 61% - 09/19/18    Status  On-going            Plan - 10/09/18 1054    Clinical Impression Statement  10th visit update.  Pt is progressing well. Most of her shoulder soreness seems to be from lymphedema with ROM in the shoulder improving and more pain with increased  swelling and weather changes.  Pt will get pump soon and would like to continue treatment until pump arrives and she knows it works well.      PT Frequency  2x / week    PT Duration  4 weeks    PT Treatment/Interventions  ADLs/Self Care Home Management;Iontophoresis 32m/ml Dexamethasone;Electrical Stimulation;Therapeutic exercise;Patient/family education;Manual techniques;Manual lymph drainage;Passive range of motion;Taping    PT Next Visit Plan  Rt shoulder PROM/STM scapular muscles/region assessment, MLD to Rt UE prn and scapular strengthening/impingment type treatment, assess and cont kinesiotape prn;  if limited improvement ?? dry needling??    PT Home Exercise Plan   [id number]    [id number] Agree with Plan of Care  Patient       Patient will benefit from skilled therapeutic intervention in order to improve the following deficits and impairments:     Visit Diagnosis: Stiffness of right shoulder, not elsewhere classified  Right shoulder pain, unspecified chronicity  Abnormal posture  Muscle weakness (generalized)  Lymphedema, not elsewhere classified     Problem List Patient Active Problem List   Diagnosis Date Noted  . Port catheter in place 12/28/2015  . Chemotherapy induced neutropenia ([place] 05/14/2015  . Chemotherapy-induced neuropathy ([facility] 04/08/2015  . 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 Tooth ache 01/09/2015  . Genetic testing 12/26/2014  . Family history of breast cancer   . Malignant neoplasm of lower-inner quadrant of right breast of female, estrogen receptor negative ([facility] 11/25/2014  . Migraine 11/19/2014    KShan Levans PT 10/09/2018, 10:56 AM  [place] [address]Phone: [phone]  Fax:  [phone] Name: Susan STRNADMRN: [id number] of Birth: 1April 03, 1977

## 2018-10-11 ENCOUNTER — Other Ambulatory Visit: Payer: Self-pay

## 2018-10-11 ENCOUNTER — Ambulatory Visit: Payer: Medicare Other | Admitting: Rehabilitation

## 2018-10-11 ENCOUNTER — Encounter: Payer: Self-pay | Admitting: Rehabilitation

## 2018-10-11 DIAGNOSIS — M25511 Pain in right shoulder: Secondary | ICD-10-CM

## 2018-10-11 DIAGNOSIS — M6281 Muscle weakness (generalized): Secondary | ICD-10-CM

## 2018-10-11 DIAGNOSIS — R293 Abnormal posture: Secondary | ICD-10-CM | POA: Diagnosis not present

## 2018-10-11 DIAGNOSIS — M25611 Stiffness of right shoulder, not elsewhere classified: Secondary | ICD-10-CM | POA: Diagnosis not present

## 2018-10-11 DIAGNOSIS — I890 Lymphedema, not elsewhere classified: Secondary | ICD-10-CM | POA: Diagnosis not present

## 2018-10-11 NOTE — Therapy (Signed)
[place], [place], [address] Phone: [phone]   Fax:  [phone]  Physical Therapy Treatment  Patient Details  Name: Susan Wilson MRN: [id number] Date of Birth: 20-Feb-1976 Referring Provider (PT): Dr. Jana Hakim   Encounter Date: 10/11/2018  PT End of Session - 10/11/18 1203    Visit Number  11    Number of Visits  21    Date for PT Re-Evaluation  10/17/18    Authorization Type  Medicare    PT Start Time  1100    PT Stop Time  1146    PT Time Calculation (min)  46 min    Activity Tolerance  Patient tolerated treatment well    Behavior During Therapy  [facility] for tasks assessed/performed       Past Medical History:  Diagnosis Date  . Anemia   . Breast asymmetry 12/2015  . Breast cancer ([place]) 2017   right breast  . History of breast cancer 11/2014   right  . History of chemotherapy   . Migraines   . 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 Personal history of chemotherapy 2017  . Personal history of radiation therapy 2017    Past Surgical History:  Procedure Laterality Date  . BREAST LUMPECTOMY Right 2017  . BREAST RECONSTRUCTION Right 06/23/2015   Procedure: ONCOPLASTIC RIGHT BREAST REDUCTION;  Surgeon: Irene Limbo, MD;  Location: [place];  Service: Plastics;  Laterality: Right;  . BREAST REDUCTION SURGERY Left 06/23/2015   Procedure: MAMMARY REDUCTION  (BREAST) LEFT BREAST FOR SYMETRY (BILATERAL BREAST REDUCTION);  Surgeon: Irene Limbo, MD;  Location: [place];  Service: Plastics;  Laterality: Left;  . BREAST REDUCTION SURGERY Left 01/15/2016   Procedure: MAMMARY REDUCTION  (BREAST) LEFT;  Surgeon: Irene Limbo, MD;  Location: [place];  Service: Plastics;  Laterality: Left;  Marland Kitchen MASTOPEXY Left 01/15/2016   Procedure: POSSIBLE LEFT MASTOPEXY;  Surgeon: Irene Limbo, MD;  Location: [place];  Service: Plastics;  Laterality: Left;  . PORT-A-CATH REMOVAL Right 01/15/2016   Procedure: REMOVAL OF RIGHT PORT-A-CATH;  Surgeon: Irene Limbo, MD;  Location: [place];  Service: Plastics;  Laterality: Right;  . PORTACATH PLACEMENT Right 12/16/2014   Procedure: INSERTION PORT-A-CATH WITH ULTRASOUND;  Surgeon: Rolm Bookbinder, MD;  Location: [place];  Service: General;  Laterality: Right;  . RADIOACTIVE SEED GUIDED PARTIAL MASTECTOMY WITH AXILLARY SENTINEL LYMPH NODE BIOPSY Right 06/15/2015   Procedure: RADIOACTIVE SEED GUIDED PARTIAL MASTECTOMY WITH AXILLARY SENTINEL LYMPH NODE BIOPSY;  Surgeon: Rolm Bookbinder, MD;  Location: [place];  Service: General;  Laterality: Right;  . REDUCTION MAMMAPLASTY Bilateral 2017    There were no vitals filed for this visit.  Subjective Assessment - 10/11/18 1106    Subjective  Just sore    Pertinent History  Rt lumpectomy and breast reduction in 2017 with SLNB.  6 lymph nodes removed all negative. A second surgery bilaterally to adjust size reduction. Completed radiation and chemotherapy.  Rt sided lymphedema and breast lymphedema     Patient Stated Goals  decrease the Rt shoulder pain    Currently in Pain?  Yes    Pain Score  3     Pain Location  Shoulder    Pain Orientation  Right    Pain Descriptors / Indicators  Aching    Pain Type  Chronic pain  [facility] Adult PT Treatment/Exercise - 10/11/18 0001      Manual Therapy   Manual Lymphatic Drainage (MLD)  In Supine: Short neck, 5 diaphramagtic breaths superficial and deep abominals, Lt axillary and Rt inguinal nodes, anterior inter-axillary and Rt axillo-inguinal anastomosis then Rt UE working from proximal to distal then retracing all steps.  then, to sidelying for posterior interaxially anastamosis. Also Rt axillary circles and posterior upper arm  towards Rt axillary      Kinesiotix   Edema  kinesiotape along  posterior interaxillary anastomosis to give lateral trunk skin a break from tape, in fan shape with 3 prongs towards and onto posterior upper arm and bucket at Rt axilla                  PT Long Term Goals - 10/09/18 1055      PT LONG TERM GOAL #1   Title  Pt will report the pain and discomfort in her right upper quadant is decreased by 50%    Baseline  10% improvement at this time as she has been our of clinic for 2 months-09/19/18; 30% - 10/03/18    Status  On-going      PT LONG TERM GOAL #2   Title  Pt will report she will be able to use the compression pump to help with her symptoms of lymphedema in her right upper quadrant     Baseline  waiting for pump; haven't received pump yet due to insurance-09/19/18; pt has spoken with [employer] rep and they said it is shipping this week or next-10/03/18    Status  Partially Met      PT LONG TERM GOAL #3   Title  Pt will improve Rt shoulder AROM into flexion to 160 degrees.    Baseline  137 degrees-09/19/18; 155 degrees - 10/03/18    Status  On-going      PT LONG TERM GOAL #4   Title  Pt will be independent in a strengthening program to right shoulder     Baseline  Progressed HEP to include standing 3 way raises, pt independent with supine scapular series-09/11/17; pt now independent with all-10/10/17; pt reports doing HEP for strengthening 3-4x/wk and being independent with this-10/03/18    Status  Achieved      PT LONG TERM GOAL #5   Title  Pt will decrease Quick DASH to 25% or less    Baseline  76%-07/18/18; 61% - 09/19/18    Status  On-going            Plan - 10/11/18 1204    Clinical Impression Statement  Continued with Rt UE MLD and taping and pt should be receiving her flexitouch in the next couple weeks.  Will continue with appointments unitl that arrives.      PT Next Visit Plan  MLD to Rt UE prn and scapular strengthening/impingment type treatment, assess and cont kinesiotape prn;         Patient will benefit from skilled  therapeutic intervention in order to improve the following deficits and impairments:     Visit Diagnosis: Stiffness of right shoulder, not elsewhere classified  Right shoulder pain, unspecified chronicity  Abnormal posture  Muscle weakness (generalized)  Lymphedema, not elsewhere classified     Problem List Patient Active Problem List   Diagnosis Date Noted  . Port catheter in place 12/28/2015  . Chemotherapy induced neutropenia ([place]) 05/14/2015  . Chemotherapy-induced neuropathy ([place]) 04/08/2015  . 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  Tooth ache 01/09/2015  . Genetic testing 12/26/2014  . Family history of breast cancer   . Malignant neoplasm of lower-inner quadrant of right breast of female, estrogen receptor negative ([place]) 11/25/2014  . Migraine 11/19/2014    Shan Levans, PT 10/11/2018, 12:09 PM  [place], [place], [address] Phone: [phone]   Fax:  [phone]  Name: Susan Wilson MRN: [id number] Date of Birth: Aug 07, 1975

## 2018-10-15 ENCOUNTER — Other Ambulatory Visit: Payer: Self-pay

## 2018-10-15 ENCOUNTER — Ambulatory Visit: Payer: Medicare Other | Attending: Oncology

## 2018-10-15 DIAGNOSIS — M25511 Pain in right shoulder: Secondary | ICD-10-CM | POA: Diagnosis not present

## 2018-10-15 DIAGNOSIS — R293 Abnormal posture: Secondary | ICD-10-CM | POA: Diagnosis not present

## 2018-10-15 DIAGNOSIS — M6281 Muscle weakness (generalized): Secondary | ICD-10-CM

## 2018-10-15 DIAGNOSIS — I890 Lymphedema, not elsewhere classified: Secondary | ICD-10-CM | POA: Diagnosis not present

## 2018-10-15 DIAGNOSIS — M25611 Stiffness of right shoulder, not elsewhere classified: Secondary | ICD-10-CM | POA: Diagnosis not present

## 2018-10-15 NOTE — Therapy (Signed)
[place], [place], [address] Phone: [phone]   Fax:  [phone]  Physical Therapy Treatment  Patient Details  Name: Susan Wilson MRN: [id number] Date of Birth: Aug 05, 1975 Referring Provider (PT): Dr. Jana Hakim   Encounter Date: 10/15/2018  PT End of Session - 10/15/18 0946    Visit Number  12    Number of Visits  21    Date for PT Re-Evaluation  10/17/18    PT Start Time  0908   pt arrived late   PT Stop Time  0946    PT Time Calculation (min)  38 min    Activity Tolerance  Patient tolerated treatment well    Behavior During Therapy  [facility] for tasks assessed/performed       Past Medical History:  Diagnosis Date  . Anemia   . Breast asymmetry 12/2015  . Breast cancer ([place]) 2017   right breast  . History of breast cancer 11/2014   right  . History of chemotherapy   . Migraines   . 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 Personal history of chemotherapy 2017  . Personal history of radiation therapy 2017    Past Surgical History:  Procedure Laterality Date  . BREAST LUMPECTOMY Right 2017  . BREAST RECONSTRUCTION Right 06/23/2015   Procedure: ONCOPLASTIC RIGHT BREAST REDUCTION;  Surgeon: Irene Limbo, MD;  Location: [place];  Service: Plastics;  Laterality: Right;  . BREAST REDUCTION SURGERY Left 06/23/2015   Procedure: MAMMARY REDUCTION  (BREAST) LEFT BREAST FOR SYMETRY (BILATERAL BREAST REDUCTION);  Surgeon: Irene Limbo, MD;  Location: [place];  Service: Plastics;  Laterality: Left;  . BREAST REDUCTION SURGERY Left 01/15/2016   Procedure: MAMMARY REDUCTION  (BREAST) LEFT;  Surgeon: Irene Limbo, MD;  Location: [place];  Service: Plastics;  Laterality: Left;  Marland Kitchen MASTOPEXY Left 01/15/2016   Procedure: POSSIBLE LEFT MASTOPEXY;  Surgeon: Irene Limbo, MD;  Location: [place];   Service: Plastics;  Laterality: Left;  . PORT-A-CATH REMOVAL Right 01/15/2016   Procedure: REMOVAL OF RIGHT PORT-A-CATH;  Surgeon: Irene Limbo, MD;  Location: [place];  Service: Plastics;  Laterality: Right;  . PORTACATH PLACEMENT Right 12/16/2014   Procedure: INSERTION PORT-A-CATH WITH ULTRASOUND;  Surgeon: Rolm Bookbinder, MD;  Location: [place];  Service: General;  Laterality: Right;  . RADIOACTIVE SEED GUIDED PARTIAL MASTECTOMY WITH AXILLARY SENTINEL LYMPH NODE BIOPSY Right 06/15/2015   Procedure: RADIOACTIVE SEED GUIDED PARTIAL MASTECTOMY WITH AXILLARY SENTINEL LYMPH NODE BIOPSY;  Surgeon: Rolm Bookbinder, MD;  Location: [place];  Service: General;  Laterality: Right;  . REDUCTION MAMMAPLASTY Bilateral 2017    There were no vitals filed for this visit.  Subjective Assessment - 10/15/18 0912    Subjective  My Rt shoulder has just been achy from being busy getting ready for my kids graduation parades.     Pertinent History  Rt lumpectomy and breast reduction in 2017 with SLNB.  6 lymph nodes removed all negative. A second surgery bilaterally to adjust size reduction. Completed radiation and chemotherapy.  Rt sided lymphedema and breast lymphedema     Patient Stated Goals  decrease the Rt shoulder pain    Currently in Pain?  No/denies         [facility] PT Assessment - 10/15/18 0001      AROM   Right Shoulder Flexion  163 Degrees    Right  Shoulder ABduction  170 Degrees        LYMPHEDEMA/ONCOLOGY QUESTIONNAIRE - 10/15/18 0940      Right Upper Extremity Lymphedema   15 cm Proximal to Olecranon Process  39.5 cm    10 cm Proximal to Olecranon Process  37.3 cm    Olecranon Process  28.5 cm    15 cm Proximal to Ulnar Styloid Process  29.8 cm    10 cm Proximal to Ulnar Styloid Process  27.3 cm    Just Proximal to Ulnar Styloid Process  18.7 cm    Across Hand at [employer]  19.9 cm    At [place] of 2nd Digit  6.3 cm                 [facility] Adult PT Treatment/Exercise - 10/15/18 0001      Shoulder Exercises: Pulleys   Flexion  2 minutes    ABduction  2 minutes      Shoulder Exercises: Therapy Ball   Flexion  Both;10 reps   with forward lean into end of stretch   ABduction  Right;10 reps   same side lean into end of stretch     Manual Therapy   Manual Lymphatic Drainage (MLD)  In Supine: Short neck, 5 diaphramagtic breaths superficial and deep abominals, Lt axillary and Rt inguinal nodes, anterior inter-axillary and Rt axillo-inguinal anastomosis then Rt UE working from proximal to distal then retracing all steps.  then, to sidelying for posterior interaxially anastamosis. Also Rt axillary circles and posterior upper arm  towards Rt axillary    Passive ROM  In Supine to Rt shoulder into flexion, abduction and D2 to tolerance                  PT Long Term Goals - 10/09/18 1055      PT LONG TERM GOAL #1   Title  Pt will report the pain and discomfort in her right upper quadant is decreased by 50%    Baseline  10% improvement at this time as she has been our of clinic for 2 months-09/19/18; 30% - 10/03/18    Status  On-going      PT LONG TERM GOAL #2   Title  Pt will report she will be able to use the compression pump to help with her symptoms of lymphedema in her right upper quadrant     Baseline  waiting for pump; haven't received pump yet due to insurance-09/19/18; pt has spoken with [employer] rep and they said it is shipping this week or next-10/03/18    Status  Partially Met      PT LONG TERM GOAL #3   Title  Pt will improve Rt shoulder AROM into flexion to 160 degrees.    Baseline  137 degrees-09/19/18; 155 degrees - 10/03/18    Status  On-going      PT LONG TERM GOAL #4   Title  Pt will be independent in a strengthening program to right shoulder     Baseline  Progressed HEP to include standing 3 way raises, pt independent with supine scapular series-09/11/17; pt now independent with  all-10/10/17; pt reports doing HEP for strengthening 3-4x/wk and being independent with this-10/03/18    Status  Achieved      PT LONG TERM GOAL #5   Title  Pt will decrease Quick DASH to 25% or less    Baseline  76%-07/18/18; 61% - 09/19/18    Status  On-going  Plan - 10/15/18 0948    Clinical Impression Statement  Reassessed A/ROM and circumference measurements today. Pt has shown good improvement with both. Her flexion of Rt shoulder has improved well, abduction is still WNLs as it was last time measured and her circumference measurements all showed good reductions as well. She reports her compression pump should arrive in the next day or so and she will begin using it right away and we will cont with circumference measurements to assess effectiveness. Pt tolerated session very well with full P/ROM and no c/o pain at end ROMs. Also discussed with pt reducing freq from 2x to 1x/wk working towards D/C in next few weeks and pt was agreeable to that.     Personal Factors and Comorbidities  Past/Current Experience;Time since onset of injury/illness/exacerbation    Examination-Activity Limitations  Carry;Lift;Reach Overhead    Examination-Participation Restrictions  Community Activity;Other   work   Stability/Clinical Decision Making  Stable/Uncomplicated    Rehab Potential  Fair    PT Frequency  2x / week    PT Duration  4 weeks    PT Treatment/Interventions  ADLs/Self Care Home Management;Iontophoresis 57m/ml Dexamethasone;Electrical Stimulation;Therapeutic exercise;Patient/family education;Manual techniques;Manual lymph drainage;Passive range of motion;Taping    PT Next Visit Plan  Renewal next visit. Pt plans for 1x/wk for next few weeks until D/C, assess if she has receieved and/or started using her pump. Begin scapular strength/impingement type treatment; cont Rt UE MLD prn and cont kinesiotape prn.     Consulted and Agree with Plan of Care  Patient       Patient will benefit  from skilled therapeutic intervention in order to improve the following deficits and impairments:  Decreased activity tolerance, Decreased range of motion, Decreased strength, Pain, Increased edema  Visit Diagnosis: Stiffness of right shoulder, not elsewhere classified  Right shoulder pain, unspecified chronicity  Abnormal posture  Muscle weakness (generalized)  Lymphedema, not elsewhere classified     Problem List Patient Active Problem List   Diagnosis Date Noted  . Port catheter in place 12/28/2015  . Chemotherapy induced neutropenia ([place] 05/14/2015  . Chemotherapy-induced neuropathy ([facility] 04/08/2015  . 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 Tooth ache 01/09/2015  . Genetic testing 12/26/2014  . Family history of breast cancer   . Malignant neoplasm of lower-inner quadrant of right breast of female, estrogen receptor negative ([place] 11/25/2014  . Migraine 11/19/2014    ROtelia Limes PTA 10/15/2018, 9:59 AM  [place] [address]Phone: [phone]  Fax:  [phone] Name: Susan PALINKASMRN: [id number] of Birth: [birth year]

## 2018-10-17 ENCOUNTER — Ambulatory Visit: Payer: Medicare Other

## 2018-10-17 ENCOUNTER — Other Ambulatory Visit: Payer: Self-pay

## 2018-10-17 DIAGNOSIS — M6281 Muscle weakness (generalized): Secondary | ICD-10-CM | POA: Diagnosis not present

## 2018-10-17 DIAGNOSIS — M25511 Pain in right shoulder: Secondary | ICD-10-CM

## 2018-10-17 DIAGNOSIS — R293 Abnormal posture: Secondary | ICD-10-CM

## 2018-10-17 DIAGNOSIS — I890 Lymphedema, not elsewhere classified: Secondary | ICD-10-CM | POA: Diagnosis not present

## 2018-10-17 DIAGNOSIS — M25611 Stiffness of right shoulder, not elsewhere classified: Secondary | ICD-10-CM | POA: Diagnosis not present

## 2018-10-17 NOTE — Therapy (Signed)
[place], [place], [address] Phone: [phone]   Fax:  [phone]  Physical Therapy Treatment  Patient Details  Name: Susan Wilson MRN: [id number] Date of Birth: [birth year] Referring Provider (PT): Dr. Jana Hakim   Encounter Date: 10/17/2018  PT End of Session - 10/17/18 0950    Visit Number  13    Number of Visits  21    Date for PT Re-Evaluation  10/17/18    PT Start Time  0909   pt arrived late   PT Stop Time  0949    PT Time Calculation (min)  40 min    Activity Tolerance  Patient tolerated treatment well    Behavior During Therapy  [facility] for tasks assessed/performed       Past Medical History:  Diagnosis Date  . Anemia   . Breast asymmetry 12/2015  . Breast cancer ([place]) 2017   right breast  . History of breast cancer 11/2014   right  . History of chemotherapy   . Migraines   . 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 Personal history of chemotherapy 2017  . Personal history of radiation therapy 2017    Past Surgical History:  Procedure Laterality Date  . BREAST LUMPECTOMY Right 2017  . BREAST RECONSTRUCTION Right 06/23/2015   Procedure: ONCOPLASTIC RIGHT BREAST REDUCTION;  Surgeon: Irene Limbo, MD;  Location: [place];  Service: Plastics;  Laterality: Right;  . BREAST REDUCTION SURGERY Left 06/23/2015   Procedure: MAMMARY REDUCTION  (BREAST) LEFT BREAST FOR SYMETRY (BILATERAL BREAST REDUCTION);  Surgeon: Irene Limbo, MD;  Location: [place];  Service: Plastics;  Laterality: Left;  . BREAST REDUCTION SURGERY Left 01/15/2016   Procedure: MAMMARY REDUCTION  (BREAST) LEFT;  Surgeon: Irene Limbo, MD;  Location: [place];  Service: Plastics;  Laterality: Left;  Marland Kitchen MASTOPEXY Left 01/15/2016   Procedure: POSSIBLE LEFT MASTOPEXY;  Surgeon: Irene Limbo, MD;  Location: [place];   Service: Plastics;  Laterality: Left;  . PORT-A-CATH REMOVAL Right 01/15/2016   Procedure: REMOVAL OF RIGHT PORT-A-CATH;  Surgeon: Irene Limbo, MD;  Location: [place];  Service: Plastics;  Laterality: Right;  . PORTACATH PLACEMENT Right 12/16/2014   Procedure: INSERTION PORT-A-CATH WITH ULTRASOUND;  Surgeon: Rolm Bookbinder, MD;  Location: [place];  Service: General;  Laterality: Right;  . RADIOACTIVE SEED GUIDED PARTIAL MASTECTOMY WITH AXILLARY SENTINEL LYMPH NODE BIOPSY Right 06/15/2015   Procedure: RADIOACTIVE SEED GUIDED PARTIAL MASTECTOMY WITH AXILLARY SENTINEL LYMPH NODE BIOPSY;  Surgeon: Rolm Bookbinder, MD;  Location: [place];  Service: General;  Laterality: Right;  . REDUCTION MAMMAPLASTY Bilateral 2017    There were no vitals filed for this visit.  Subjective Assessment - 10/17/18 0912    Subjective  My pump arrived yesterday and I hope to try to use it in next few days. I'm planning a graduation picnic for my daughter so I'll be busy with preparations all week.     Pertinent History  Rt lumpectomy and breast reduction in 2017 with SLNB.  6 lymph nodes removed all negative. A second surgery bilaterally to adjust size reduction. Completed radiation and chemotherapy.  Rt sided lymphedema and breast lymphedema     Patient Stated Goals  decrease the Rt shoulder pain    Pain Score  3     Pain Location  Shoulder    Pain Orientation  Right  Pain Descriptors / Indicators  Aching    Pain Type  Chronic pain    Pain Onset  More than a month ago    Pain Frequency  Intermittent    Aggravating Factors   the weather seems to be the biggest factor that affects my arm    Pain Relieving Factors  kinesiotape and MLD                       [facility] Adult PT Treatment/Exercise - 10/17/18 0001      Shoulder Exercises: Standing   Other Standing Exercises  Bil UE 3 way raises with 1 lb to shoulder height x10 each into flexion, scaption and abduction  with back against wall and core engaged.       Shoulder Exercises: Pulleys   Flexion  2 minutes    ABduction  2 minutes      Shoulder Exercises: Therapy Ball   Flexion  Both;10 reps   1 lb on each wrist; forward lean into end of stretch   ABduction  Right;10 reps   1 lb on wrist     Manual Therapy   Manual Lymphatic Drainage (MLD)  In Supine: Short neck, 5 diaphramagtic breaths, Lt axillary and Rt inguinal nodes, anterior inter-axillary and Rt axillo-inguinal anastomosis then Rt UE working from proximal to distal then retracing all steps.     Passive ROM  In Supine to Rt shoulder into flexion, abduction and D2 to tolerance                  PT Long Term Goals - 10/09/18 1055      PT LONG TERM GOAL #1   Title  Pt will report the pain and discomfort in her right upper quadant is decreased by 50%    Baseline  10% improvement at this time as she has been our of clinic for 2 months-09/19/18; 30% - 10/03/18    Status  On-going      PT LONG TERM GOAL #2   Title  Pt will report she will be able to use the compression pump to help with her symptoms of lymphedema in her right upper quadrant     Baseline  waiting for pump; haven't received pump yet due to insurance-09/19/18; pt has spoken with [employer] rep and they said it is shipping this week or next-10/03/18    Status  Partially Met      PT LONG TERM GOAL #3   Title  Pt will improve Rt shoulder AROM into flexion to 160 degrees.    Baseline  137 degrees-09/19/18; 155 degrees - 10/03/18    Status  On-going      PT LONG TERM GOAL #4   Title  Pt will be independent in a strengthening program to right shoulder     Baseline  Progressed HEP to include standing 3 way raises, pt independent with supine scapular series-09/11/17; pt now independent with all-10/10/17; pt reports doing HEP for strengthening 3-4x/wk and being independent with this-10/03/18    Status  Achieved      PT LONG TERM GOAL #5   Title  Pt will decrease Quick DASH to 25% or  less    Baseline  76%-07/18/18; 61% - 09/19/18    Status  On-going            Plan - 10/17/18 0950    Clinical Impression Statement  Continued with progressing pt adding resistance to AA/ROM and standing 3 way raises. She  tolerated this fine without any c/o discomfort. She also had full P/ROM with no c/o discomfort. Pts compression pump arrived yesterday. Advised her to begin using it soon daily so we can remeasure her circumference at next few sessions to assess effectiveness. Pt verbalized understanding.     Personal Factors and Comorbidities  Past/Current Experience;Time since onset of injury/illness/exacerbation    Examination-Activity Limitations  Carry;Lift;Reach Overhead    Examination-Participation Restrictions  Community Activity;Other   work   Stability/Clinical Decision Making  Stable/Uncomplicated    Rehab Potential  Fair    PT Frequency  2x / week    PT Duration  4 weeks    PT Treatment/Interventions  ADLs/Self Care Home Management;Iontophoresis 61m/ml Dexamethasone;Electrical Stimulation;Therapeutic exercise;Patient/family education;Manual techniques;Manual lymph drainage;Passive range of motion;Taping    PT Next Visit Plan  Renewal next visit. Pt plans for 1x/wk for next few weeks until D/C, assess if she has started using her pump. Begin scapular strength/impingement type treatment; cont Rt UE MLD prn and cont kinesiotape prn.     Consulted and Agree with Plan of Care  Patient       Patient will benefit from skilled therapeutic intervention in order to improve the following deficits and impairments:  Decreased activity tolerance, Decreased range of motion, Decreased strength, Pain, Increased edema  Visit Diagnosis: Stiffness of right shoulder, not elsewhere classified  Right shoulder pain, unspecified chronicity  Abnormal posture  Muscle weakness (generalized)  Lymphedema, not elsewhere classified     Problem List Patient Active Problem List   Diagnosis Date  Noted  . Port catheter in place 12/28/2015  . Chemotherapy induced neutropenia ([place] 05/14/2015  . Chemotherapy-induced neuropathy ([place] 04/08/2015  . 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 Tooth ache 01/09/2015  . Genetic testing 12/26/2014  . Family history of breast cancer   . Malignant neoplasm of lower-inner quadrant of right breast of female, estrogen receptor negative ([place] 11/25/2014  . Migraine 11/19/2014    ROtelia Limes PTA 10/17/2018, 9:56 AM  [place] [address]Phone: [phone]  Fax:  [phone] Name: Susan GORBYMRN: [id number] of Birth: 1October 11, 1977

## 2018-10-22 ENCOUNTER — Ambulatory Visit: Payer: Medicare Other

## 2018-10-22 DIAGNOSIS — R293 Abnormal posture: Secondary | ICD-10-CM | POA: Diagnosis not present

## 2018-10-22 DIAGNOSIS — I890 Lymphedema, not elsewhere classified: Secondary | ICD-10-CM | POA: Diagnosis not present

## 2018-10-22 DIAGNOSIS — M6281 Muscle weakness (generalized): Secondary | ICD-10-CM

## 2018-10-22 DIAGNOSIS — M25611 Stiffness of right shoulder, not elsewhere classified: Secondary | ICD-10-CM

## 2018-10-22 DIAGNOSIS — M25511 Pain in right shoulder: Secondary | ICD-10-CM | POA: Diagnosis not present

## 2018-10-22 NOTE — Therapy (Signed)
[place], [place], [address] Phone: [phone]   Fax:  [phone]  Physical Therapy Treatment  Patient Details  Name: Susan Wilson MRN: [id number] Date of Birth: Dec 29, 1975 Referring Provider (PT): Dr. Jana Hakim   Encounter Date: 10/22/2018  PT End of Session - 10/22/18 1028    Visit Number  14    Number of Visits  25    Date for PT Re-Evaluation  11/19/18    Authorization Type  Medicare    PT Start Time  0910   pt arrived late    PT Stop Time  1020    PT Time Calculation (min)  70 min    Activity Tolerance  Patient tolerated treatment well    Behavior During Therapy  [facility] for tasks assessed/performed       Past Medical History:  Diagnosis Date  . Anemia   . Breast asymmetry 12/2015  . Breast cancer ([place]) 2017   right breast  . History of breast cancer 11/2014   right  . History of chemotherapy   . Migraines   . 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 Personal history of chemotherapy 2017  . Personal history of radiation therapy 2017    Past Surgical History:  Procedure Laterality Date  . BREAST LUMPECTOMY Right 2017  . BREAST RECONSTRUCTION Right 06/23/2015   Procedure: ONCOPLASTIC RIGHT BREAST REDUCTION;  Surgeon: Irene Limbo, MD;  Location: [place];  Service: Plastics;  Laterality: Right;  . BREAST REDUCTION SURGERY Left 06/23/2015   Procedure: MAMMARY REDUCTION  (BREAST) LEFT BREAST FOR SYMETRY (BILATERAL BREAST REDUCTION);  Surgeon: Irene Limbo, MD;  Location: [place];  Service: Plastics;  Laterality: Left;  . BREAST REDUCTION SURGERY Left 01/15/2016   Procedure: MAMMARY REDUCTION  (BREAST) LEFT;  Surgeon: Irene Limbo, MD;  Location: [place];  Service: Plastics;  Laterality: Left;  Marland Kitchen MASTOPEXY Left 01/15/2016   Procedure: POSSIBLE LEFT MASTOPEXY;  Surgeon: Irene Limbo, MD;  Location:  [place];  Service: Plastics;  Laterality: Left;  . PORT-A-CATH REMOVAL Right 01/15/2016   Procedure: REMOVAL OF RIGHT PORT-A-CATH;  Surgeon: Irene Limbo, MD;  Location: [place];  Service: Plastics;  Laterality: Right;  . PORTACATH PLACEMENT Right 12/16/2014   Procedure: INSERTION PORT-A-CATH WITH ULTRASOUND;  Surgeon: Rolm Bookbinder, MD;  Location: [place];  Service: General;  Laterality: Right;  . RADIOACTIVE SEED GUIDED PARTIAL MASTECTOMY WITH AXILLARY SENTINEL LYMPH NODE BIOPSY Right 06/15/2015   Procedure: RADIOACTIVE SEED GUIDED PARTIAL MASTECTOMY WITH AXILLARY SENTINEL LYMPH NODE BIOPSY;  Surgeon: Rolm Bookbinder, MD;  Location: [place];  Service: General;  Laterality: Right;  . REDUCTION MAMMAPLASTY Bilateral 2017    There were no vitals filed for this visit.  Subjective Assessment - 10/22/18 0913    Subjective  I did the pump a few times over the weekend. I tried the different settings and couldn't find one that was comfortable. So I'm going to call the company. My Rt arm feels a bit sore today just from our busy weekend with my daughter and trying to use the pump.     Pertinent History  Rt lumpectomy and breast reduction in 2017 with SLNB.  6 lymph nodes removed all negative. A second surgery bilaterally to adjust size reduction. Completed radiation and chemotherapy.  Rt sided lymphedema and breast lymphedema     Patient Stated Goals  decrease the Rt  shoulder pain    Currently in Pain?  No/denies         [facility] PT Assessment - 10/22/18 0001      AROM   Right Shoulder Flexion  165 Degrees    Right Shoulder ABduction  174 Degrees        LYMPHEDEMA/ONCOLOGY QUESTIONNAIRE - 10/22/18 1001      Right Upper Extremity Lymphedema   15 cm Proximal to Olecranon Process  38.7 cm    10 cm Proximal to Olecranon Process  37.1 cm    Olecranon Process  28.1 cm    15 cm Proximal to Ulnar Styloid Process  30.1 cm    10 cm Proximal to  Ulnar Styloid Process  27.3 cm    Just Proximal to Ulnar Styloid Process  18.7 cm    Across Hand at [employer]  19.9 cm    At [place] of 2nd Digit  6.5 cm    Other  At chest with arms at 90 degrees abduction at bil axillae after deep breath exhalation 99.3 cm    Other  Around breast directly superior to nipple with sports bra on after deep breath exhalation 110.4 cm        Quick Dash - 10/22/18 0001    Open a tight or new jar  Severe difficulty    Do heavy household chores (wash walls, wash floors)  Severe difficulty    Carry a shopping bag or briefcase  Mild difficulty    Wash your back  Severe difficulty    Use a knife to cut food  Mild difficulty    Recreational activities in which you take some force or impact through your arm, shoulder, or hand (golf, hammering, tennis)  Severe difficulty    During the past week, to what extent has your arm, shoulder or hand problem interfered with your normal social activities with family, friends, neighbors, or groups?  Modererately    During the past week, to what extent has your arm, shoulder or hand problem limited your work or other regular daily activities  Modererately    Arm, shoulder, or hand pain.  Severe    Tingling (pins and needles) in your arm, shoulder, or hand  Moderate    Difficulty Sleeping  Moderate difficulty    DASH Score  56.82 %             [facility] Adult PT Treatment/Exercise - 10/22/18 0001      Shoulder Exercises: Standing   Other Standing Exercises  Bil UE 3 way raises with 2 lb to shoulder height x10 each into flexion, scaption and abduction with back against wall and core engaged.       Shoulder Exercises: Pulleys   Flexion  2 minutes    ABduction  2 minutes      Shoulder Exercises: Therapy Ball   Flexion  Both;10 reps   forward lean into end of stretch   ABduction  Right;10 reps   same side lean into end of stretch     Manual Therapy   Manual Lymphatic Drainage (MLD)  In Supine: Short neck, 5  diaphramagtic breaths, Lt axillary and Rt inguinal nodes, anterior inter-axillary and Rt axillo-inguinal anastomosis then Rt UE working from proximal to distal then retracing all steps.     Passive ROM  In Supine to Rt shoulder into flexion, abduction and D2 to tolerance      Kinesiotix   Edema  Kinesiotape along Rt axillo-inguinal anastomosis with 3 fingers running  towards lateral breast                  PT Long Term Goals - 10/22/18 0920      PT LONG TERM GOAL #1   Title  Pt will report the pain and discomfort in her right upper quadant is decreased by 50%    Baseline  10% improvement at this time as she has been our of clinic for 2 months-09/19/18; 30% - 10/03/18; 40% improvement at this time-10/22/18    Status  On-going      PT LONG TERM GOAL #2   Title  Pt will report she will be able to use the compression pump to help with her symptoms of lymphedema in her right upper quadrant     Baseline  waiting for pump; haven't received pump yet due to insurance-09/19/18; pt has spoken with [employer] rep and they said it is shipping this week or next-10/03/18; pt now has this and is working with reps to get compression right-10/22/18    Status  Partially Met      PT LONG TERM GOAL #3   Title  Pt will improve Rt shoulder AROM into flexion to 160 degrees.    Baseline  137 degrees-09/19/18; 155 degrees - 10/03/18; 165 degrees-10/22/18    Status  Achieved      PT LONG TERM GOAL #4   Title  Pt will be independent in a strengthening program to right shoulder     Baseline  Progressed HEP to include standing 3 way raises, pt independent with supine scapular series-09/11/17; pt now independent with all-10/10/17; pt reports doing HEP for strengthening 3-4x/wk and being independent with this-10/03/18; also instructed how to incorporate wall push ups-10/22/18    Status  Achieved      PT LONG TERM GOAL #5   Title  Pt will decrease Quick DASH to 25% or less    Baseline  76%-07/18/18; 61% - 09/19/18; 56.82%-  -6/8//20    Status  On-going            Plan - 10/22/18 1029    Clinical Impression Statement  Therapists following treatment slot had cancelled so was able to treat pt longer today. She has met 2/5 goals, though has improved each time pt has retaken DASH, and demonstrates good progress towards other remaining goals as well. She received her compression pump last week and has used it a few times since. She reports settings were low, medium and high and she didn't feel comfortable amount of compression with any (one too much, one too little) so advised her to call compression company to see if they could talk her thru changing the amount of compression. Pt verbalized understanding. Did retake her circumference measurements and most measurements had maintained or reduced. Also added 2 chest/breast measurements to assess chest/trunk swelling that pt c/o fluctuating. Renewal done today for 1x/wk to assess use of compression pump to see if pt will benefit more from advanced model with trunk piece instead of basic that she has now which is only arm piece.     Personal Factors and Comorbidities  Past/Current Experience;Time since onset of injury/illness/exacerbation    Examination-Activity Limitations  Carry;Lift;Reach Overhead    Examination-Participation Restrictions  Community Activity;Other   work   Stability/Clinical Decision Making  Stable/Uncomplicated    Rehab Potential  Fair    PT Frequency  1x / week    PT Duration  4 weeks    PT Treatment/Interventions  ADLs/Self Care Home Management;Iontophoresis  49m/ml Dexamethasone;Electrical Stimulation;Therapeutic exercise;Patient/family education;Manual techniques;Manual lymph drainage;Passive range of motion;Taping    PT Next Visit Plan  Renewal done this visit. Assess how use of her compression pump is going/able to change setting? Remeasure circumference each session to assess effect of pump. Cont scapular strength/impingement type treatment; cont  Rt UE MLD prn and cont kinesiotape prn.     Consulted and Agree with Plan of Care  Patient       Patient will benefit from skilled therapeutic intervention in order to improve the following deficits and impairments:  Decreased activity tolerance, Decreased range of motion, Decreased strength, Pain, Increased edema  Visit Diagnosis: Stiffness of right shoulder, not elsewhere classified  Right shoulder pain, unspecified chronicity  Abnormal posture  Muscle weakness (generalized)  Lymphedema, not elsewhere classified     Problem List Patient Active Problem List   Diagnosis Date Noted  . Port catheter in place 12/28/2015  . Chemotherapy induced neutropenia ([place] 05/14/2015  . Chemotherapy-induced neuropathy ([place] 04/08/2015  . 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 Tooth ache 01/09/2015  . Genetic testing 12/26/2014  . Family history of breast cancer   . Malignant neoplasm of lower-inner quadrant of right breast of female, estrogen receptor negative ([place] 11/25/2014  . Migraine 11/19/2014    ROtelia Limes PTA 10/22/2018, 10:46 AM  [place] [address]Phone: [phone]  Fax:  [phone] Name: Susan CUTSFORTHMRN: [id number] of Birth: 115-May-1977

## 2018-10-24 NOTE — Addendum Note (Signed)
Addended by: Stark Bray on: 10/24/2018 03:55 PM   Modules accepted: Orders

## 2018-10-29 ENCOUNTER — Ambulatory Visit: Payer: Medicare Other

## 2018-10-29 ENCOUNTER — Other Ambulatory Visit: Payer: Self-pay

## 2018-10-29 DIAGNOSIS — M25611 Stiffness of right shoulder, not elsewhere classified: Secondary | ICD-10-CM

## 2018-10-29 DIAGNOSIS — R293 Abnormal posture: Secondary | ICD-10-CM

## 2018-10-29 DIAGNOSIS — M25511 Pain in right shoulder: Secondary | ICD-10-CM

## 2018-10-29 DIAGNOSIS — M6281 Muscle weakness (generalized): Secondary | ICD-10-CM

## 2018-10-29 DIAGNOSIS — I890 Lymphedema, not elsewhere classified: Secondary | ICD-10-CM | POA: Diagnosis not present

## 2018-10-29 NOTE — Therapy (Signed)
[place], [place], [address] Phone: [phone]   Fax:  [phone]  Physical Therapy Treatment  Patient Details  Name: Susan Wilson MRN: [id number] Date of Birth: [birth year] Referring Provider (PT): Dr. Jana Hakim   Encounter Date: 10/29/2018  PT End of Session - 10/29/18 1053    Visit Number  15    Number of Visits  25    Date for PT Re-Evaluation  11/19/18    PT Start Time  1005    PT Stop Time  1048    PT Time Calculation (min)  43 min    Activity Tolerance  Patient tolerated treatment well    Behavior During Therapy  [facility] for tasks assessed/performed       Past Medical History:  Diagnosis Date  . Anemia   . Breast asymmetry 12/2015  . Breast cancer ([place]) 2017   right breast  . History of breast cancer 11/2014   right  . History of chemotherapy   . Migraines   . 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 Personal history of chemotherapy 2017  . Personal history of radiation therapy 2017    Past Surgical History:  Procedure Laterality Date  . BREAST LUMPECTOMY Right 2017  . BREAST RECONSTRUCTION Right 06/23/2015   Procedure: ONCOPLASTIC RIGHT BREAST REDUCTION;  Surgeon: Irene Limbo, MD;  Location: [place];  Service: Plastics;  Laterality: Right;  . BREAST REDUCTION SURGERY Left 06/23/2015   Procedure: MAMMARY REDUCTION  (BREAST) LEFT BREAST FOR SYMETRY (BILATERAL BREAST REDUCTION);  Surgeon: Irene Limbo, MD;  Location: [place];  Service: Plastics;  Laterality: Left;  . BREAST REDUCTION SURGERY Left 01/15/2016   Procedure: MAMMARY REDUCTION  (BREAST) LEFT;  Surgeon: Irene Limbo, MD;  Location: [place];  Service: Plastics;  Laterality: Left;  Marland Kitchen MASTOPEXY Left 01/15/2016   Procedure: POSSIBLE LEFT MASTOPEXY;  Surgeon: Irene Limbo, MD;  Location: [place];  Service: Plastics;   Laterality: Left;  . PORT-A-CATH REMOVAL Right 01/15/2016   Procedure: REMOVAL OF RIGHT PORT-A-CATH;  Surgeon: Irene Limbo, MD;  Location: [place];  Service: Plastics;  Laterality: Right;  . PORTACATH PLACEMENT Right 12/16/2014   Procedure: INSERTION PORT-A-CATH WITH ULTRASOUND;  Surgeon: Rolm Bookbinder, MD;  Location: [facility];  Service: General;  Laterality: Right;  . RADIOACTIVE SEED GUIDED PARTIAL MASTECTOMY WITH AXILLARY SENTINEL LYMPH NODE BIOPSY Right 06/15/2015   Procedure: RADIOACTIVE SEED GUIDED PARTIAL MASTECTOMY WITH AXILLARY SENTINEL LYMPH NODE BIOPSY;  Surgeon: Rolm Bookbinder, MD;  Location: [place];  Service: General;  Laterality: Right;  . REDUCTION MAMMAPLASTY Bilateral 2017    There were no vitals filed for this visit.  Subjective Assessment - 10/29/18 1009    Subjective  My family and I went to the beach this past weekend and we had a great, relaxing time. My Rt arm got a little achy with the back and forth between the A/C and heat outside but overall it did well. And I walked 2x/day while we there and that was nice too.    Pertinent History  Rt lumpectomy and breast reduction in 2017 with SLNB.  6 lymph nodes removed all negative. A second surgery bilaterally to adjust size reduction. Completed radiation and chemotherapy.  Rt sided lymphedema and breast lymphedema     Patient Stated Goals  decrease the Rt shoulder pain    Currently in Pain?  No/denies  [facility] Adult PT Treatment/Exercise - 10/29/18 0001      Shoulder Exercises: Standing   Row  Strengthening;Right;Left;10 reps;Theraband   forearms on wall, alternating UE's   Theraband Level (Shoulder Row)  Level 2 (Red)    Other Standing Exercises  Bil UE 3 way raises with 2 lb to shoulder height x10 each into flexion, scaption and abduction with back against wall and core engaged.       Shoulder Exercises: Pulleys   Flexion  1 minute     ABduction  1 minute      Shoulder Exercises: Therapy Ball   Flexion  Both;10 reps   forward lean into end of stretch   ABduction  Right;10 reps   same side lean into end of stretch     Manual Therapy   Manual Lymphatic Drainage (MLD)  In Supine: Short neck, 5 diaphramagtic breaths, Lt axillary and Rt inguinal nodes, anterior inter-axillary and Rt axillo-inguinal anastomosis then Rt UE working from proximal to distal then retracing all steps.     Passive ROM  In Supine to Rt shoulder into flexion, abduction and D2 to tolerance                  PT Long Term Goals - 10/22/18 0920      PT LONG TERM GOAL #1   Title  Pt will report the pain and discomfort in her right upper quadant is decreased by 50%    Baseline  10% improvement at this time as she has been our of clinic for 2 months-09/19/18; 30% - 10/03/18; 40% improvement at this time-10/22/18    Status  On-going      PT LONG TERM GOAL #2   Title  Pt will report she will be able to use the compression pump to help with her symptoms of lymphedema in her right upper quadrant     Baseline  waiting for pump; haven't received pump yet due to insurance-09/19/18; pt has spoken with [employer] rep and they said it is shipping this week or next-10/03/18; pt now has this and is working with reps to get compression right-10/22/18    Status  Partially Met      PT LONG TERM GOAL #3   Title  Pt will improve Rt shoulder AROM into flexion to 160 degrees.    Baseline  137 degrees-09/19/18; 155 degrees - 10/03/18; 165 degrees-10/22/18    Status  Achieved      PT LONG TERM GOAL #4   Title  Pt will be independent in a strengthening program to right shoulder     Baseline  Progressed HEP to include standing 3 way raises, pt independent with supine scapular series-09/11/17; pt now independent with all-10/10/17; pt reports doing HEP for strengthening 3-4x/wk and being independent with this-10/03/18; also instructed how to incorporate wall push ups-10/22/18    Status   Achieved      PT LONG TERM GOAL #5   Title  Pt will decrease Quick DASH to 25% or less    Baseline  76%-07/18/18; 61% - 09/19/18; 56.82%- -6/8//20    Status  On-going            Plan - 10/29/18 1014    Clinical Impression Statement  Pt reports went to beach this weeknd with her family so did not use pump from Thurs-Sat while they were gone. She did resume use yesterday so did not remeasure circumference today. Continued with bil UE strength and added scapular stability exercise as well today  which pt tolerated very well. Her end P/ROM is full without c/o tightness at end ROM anymore.    Personal Factors and Comorbidities  Past/Current Experience;Time since onset of injury/illness/exacerbation    Examination-Activity Limitations  Carry;Lift;Reach Overhead    Examination-Participation Restrictions  Community Activity;Other   work   Stability/Clinical Decision Making  Stable/Uncomplicated    Rehab Potential  Fair    PT Frequency  1x / week    PT Duration  4 weeks    PT Treatment/Interventions  ADLs/Self Care Home Management;Iontophoresis 63m/ml Dexamethasone;Electrical Stimulation;Therapeutic exercise;Patient/family education;Manual techniques;Manual lymph drainage;Passive range of motion;Taping    PT Next Visit Plan  Assess how use of her compression pump is going/able to change setting? Remeasure circumference each session to assess effect of pump. Cont scapular strength/impingement type treatment; cont Rt UE MLD prn and cont kinesiotape prn.    Consulted and Agree with Plan of Care  Patient       Patient will benefit from skilled therapeutic intervention in order to improve the following deficits and impairments:  Decreased activity tolerance, Decreased range of motion, Decreased strength, Pain, Increased edema  Visit Diagnosis: Stiffness of right shoulder, not elsewhere classified  Right shoulder pain, unspecified chronicity  Abnormal posture  Muscle weakness  (generalized)  Lymphedema, not elsewhere classified     Problem List Patient Active Problem List   Diagnosis Date Noted  . Port catheter in place 12/28/2015  . Chemotherapy induced neutropenia ([facility] 05/14/2015  . Chemotherapy-induced neuropathy ([facility] 04/08/2015  . 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 Tooth ache 01/09/2015  . Genetic testing 12/26/2014  . Family history of breast cancer   . Malignant neoplasm of lower-inner quadrant of right breast of female, estrogen receptor negative ([facility] 11/25/2014  . Migraine 11/19/2014    ROtelia Limes PTA 10/29/2018, 10:57 AM  [place] [address]Phone: [phone]  Fax:  [phone] Name: CNANI INGRAMMRN: [id number] of Birth: 106-03-1976

## 2018-10-30 ENCOUNTER — Emergency Department (HOSPITAL_COMMUNITY)
Admission: EM | Admit: 2018-10-30 | Discharge: 2018-10-30 | Disposition: A | Discharge status: Home / Self Care | Payer: No Typology Code available for payment source | Attending: Emergency Medicine | Admitting: Emergency Medicine

## 2018-10-30 ENCOUNTER — Other Ambulatory Visit: Payer: Self-pay

## 2018-10-30 DIAGNOSIS — M542 Cervicalgia: Secondary | ICD-10-CM | POA: Insufficient documentation

## 2018-10-30 DIAGNOSIS — M7918 Myalgia, other site: Secondary | ICD-10-CM | POA: Diagnosis not present

## 2018-10-30 DIAGNOSIS — Z041 Encounter for examination and observation following transport accident: Secondary | ICD-10-CM | POA: Diagnosis present

## 2018-10-30 MED ORDER — CYCLOBENZAPRINE HCL 10 MG PO TABS: 10.0000 mg | ORAL_TABLET | Freq: Two times a day (BID) | ORAL | 0 refills | Status: DC | PRN

## 2018-10-30 MED ORDER — KETOROLAC TROMETHAMINE 30 MG/ML IJ SOLN
30.0000 mg | Freq: Once | INTRAMUSCULAR | Status: AC
  Administered 2018-10-30: 30 mg via INTRAMUSCULAR
  Filled 2018-10-30: qty 1

## 2018-10-30 MED ORDER — METOCLOPRAMIDE HCL 10 MG PO TABS
5.0000 mg | ORAL_TABLET | Freq: Once | ORAL | Status: AC
  Administered 2018-10-30: 5 mg via ORAL
  Filled 2018-10-30: qty 1

## 2018-10-30 MED ORDER — DIPHENHYDRAMINE HCL 25 MG PO CAPS
25.0000 mg | ORAL_CAPSULE | Freq: Once | ORAL | Status: AC
  Administered 2018-10-30: 25 mg via ORAL
  Filled 2018-10-30: qty 1
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
## 2018-10-30 NOTE — Discharge Instructions (Signed)
Please read attached information. If you experience any new or worsening signs or symptoms please return to the emergency room for evaluation. Please follow-up with your primary care provider or specialist as discussed. Please use medication prescribed only as directed and discontinue taking if you have any concerning signs or symptoms.   °

## 2018-11-05 ENCOUNTER — Other Ambulatory Visit: Payer: Self-pay

## 2018-11-05 ENCOUNTER — Ambulatory Visit: Payer: Medicare Other

## 2018-11-05 DIAGNOSIS — M6281 Muscle weakness (generalized): Secondary | ICD-10-CM | POA: Diagnosis not present

## 2018-11-05 DIAGNOSIS — M25511 Pain in right shoulder: Secondary | ICD-10-CM | POA: Diagnosis not present

## 2018-11-05 DIAGNOSIS — R293 Abnormal posture: Secondary | ICD-10-CM | POA: Diagnosis not present

## 2018-11-05 DIAGNOSIS — M25611 Stiffness of right shoulder, not elsewhere classified: Secondary | ICD-10-CM | POA: Diagnosis not present

## 2018-11-05 DIAGNOSIS — I890 Lymphedema, not elsewhere classified: Secondary | ICD-10-CM
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
## 2018-11-12 ENCOUNTER — Other Ambulatory Visit: Payer: Self-pay

## 2018-11-12 ENCOUNTER — Other Ambulatory Visit: Payer: Self-pay | Admitting: *Deleted

## 2018-11-12 ENCOUNTER — Telehealth: Payer: Self-pay | Admitting: *Deleted

## 2018-11-12 ENCOUNTER — Ambulatory Visit: Payer: Medicare Other

## 2018-11-12 DIAGNOSIS — M6281 Muscle weakness (generalized): Secondary | ICD-10-CM | POA: Diagnosis not present

## 2018-11-12 DIAGNOSIS — M25511 Pain in right shoulder: Secondary | ICD-10-CM

## 2018-11-12 DIAGNOSIS — R293 Abnormal posture: Secondary | ICD-10-CM | POA: Diagnosis not present

## 2018-11-12 DIAGNOSIS — M25611 Stiffness of right shoulder, not elsewhere classified: Secondary | ICD-10-CM

## 2018-11-12 DIAGNOSIS — M7918 Myalgia, other site: Secondary | ICD-10-CM

## 2018-11-12 DIAGNOSIS — I890 Lymphedema, not elsewhere classified: Secondary | ICD-10-CM
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
## 2018-11-26 ENCOUNTER — Other Ambulatory Visit: Payer: Self-pay | Admitting: *Deleted

## 2018-11-26 DIAGNOSIS — C50311 Malignant neoplasm of lower-inner quadrant of right female breast: Secondary | ICD-10-CM

## 2018-11-26 MED ORDER — MELOXICAM 15 MG PO TABS: 15.0000 mg | ORAL_TABLET | Freq: Every day | ORAL | 5 refills | Status: DC

## 2018-12-04 ENCOUNTER — Telehealth: Payer: Self-pay | Admitting: Family Medicine

## 2018-12-04 NOTE — Telephone Encounter (Signed)
Left a detailed message about appt change was told to give our office a call if she had any questions

## 2018-12-05 ENCOUNTER — Ambulatory Visit: Payer: Medicare Other | Admitting: Certified Nurse Midwife

## 2018-12-05 ENCOUNTER — Ambulatory Visit: Payer: Medicare Other | Admitting: Obstetrics & Gynecology

## 2018-12-11 ENCOUNTER — Encounter: Payer: Self-pay | Admitting: Physical Therapy

## 2018-12-11 ENCOUNTER — Ambulatory Visit: Payer: Medicare Other | Attending: Oncology | Admitting: Physical Therapy

## 2018-12-11 ENCOUNTER — Other Ambulatory Visit: Payer: Self-pay

## 2018-12-11 DIAGNOSIS — R293 Abnormal posture: Secondary | ICD-10-CM | POA: Diagnosis not present

## 2018-12-11 DIAGNOSIS — M545 Low back pain, unspecified: Secondary | ICD-10-CM

## 2018-12-11 DIAGNOSIS — M542 Cervicalgia: Secondary | ICD-10-CM | POA: Diagnosis not present

## 2018-12-11 DIAGNOSIS — M546 Pain in thoracic spine: Secondary | ICD-10-CM | POA: Diagnosis not present

## 2018-12-11 DIAGNOSIS — M6281 Muscle weakness (generalized): Secondary | ICD-10-CM | POA: Diagnosis not present
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
## 2018-12-13 ENCOUNTER — Encounter: Payer: Self-pay | Admitting: Physical Therapy

## 2018-12-13 ENCOUNTER — Ambulatory Visit: Payer: Medicare Other | Admitting: Physical Therapy

## 2018-12-13 ENCOUNTER — Other Ambulatory Visit: Payer: Self-pay

## 2018-12-13 DIAGNOSIS — M6281 Muscle weakness (generalized): Secondary | ICD-10-CM

## 2018-12-13 DIAGNOSIS — M546 Pain in thoracic spine: Secondary | ICD-10-CM

## 2018-12-13 DIAGNOSIS — M545 Low back pain, unspecified: Secondary | ICD-10-CM

## 2018-12-13 DIAGNOSIS — R293 Abnormal posture: Secondary | ICD-10-CM | POA: Diagnosis not present

## 2018-12-13 DIAGNOSIS — M542 Cervicalgia: Secondary | ICD-10-CM | POA: Diagnosis not present

## 2018-12-13 NOTE — Therapy (Signed)
[place], [place], [address] Phone: [phone]   Fax:  [phone]  Physical Therapy Treatment  Patient Details  Name: Susan Wilson MRN: [id number] Date of Birth: 1976-05-01 Referring Provider (PT): Susan Del, MD   Encounter Date: 12/13/2018  PT End of Session - 12/13/18 0805    Visit Number  2    Number of Visits  12    Date for PT Re-Evaluation  02/11/19    Authorization Type  Medicare    PT Start Time  0805    PT Stop Time  0845    PT Time Calculation (min)  40 min       Past Medical History:  Diagnosis Date  . Anemia   . Breast asymmetry 12/2015  . Breast cancer ([place]) 2017   right breast  . History of breast cancer 11/2014   right  . History of chemotherapy   . Migraines   . 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 Personal history of chemotherapy 2017  . Personal history of radiation therapy 2017    Past Surgical History:  Procedure Laterality Date  . BREAST LUMPECTOMY Right 2017  . BREAST RECONSTRUCTION Right 06/23/2015   Procedure: ONCOPLASTIC RIGHT BREAST REDUCTION;  Surgeon: Susan Limbo, MD;  Location: [place];  Service: Plastics;  Laterality: Right;  . BREAST REDUCTION SURGERY Left 06/23/2015   Procedure: MAMMARY REDUCTION  (BREAST) LEFT BREAST FOR SYMETRY (BILATERAL BREAST REDUCTION);  Surgeon: Susan Limbo, MD;  Location: [place];  Service: Plastics;  Laterality: Left;  . BREAST REDUCTION SURGERY Left 01/15/2016   Procedure: MAMMARY REDUCTION  (BREAST) LEFT;  Surgeon: Susan Limbo, MD;  Location: [place];  Service: Plastics;  Laterality: Left;  Marland Kitchen MASTOPEXY Left 01/15/2016   Procedure: POSSIBLE LEFT MASTOPEXY;  Surgeon: Susan Limbo, MD;  Location: [place];  Service: Plastics;  Laterality: Left;  . PORT-A-CATH REMOVAL Right 01/15/2016   Procedure: REMOVAL OF  RIGHT PORT-A-CATH;  Surgeon: Susan Limbo, MD;  Location: [place];  Service: Plastics;  Laterality: Right;  . PORTACATH PLACEMENT Right 12/16/2014   Procedure: INSERTION PORT-A-CATH WITH ULTRASOUND;  Surgeon: Susan Bookbinder, MD;  Location: [place];  Service: General;  Laterality: Right;  . RADIOACTIVE SEED GUIDED PARTIAL MASTECTOMY WITH AXILLARY SENTINEL LYMPH NODE BIOPSY Right 06/15/2015   Procedure: RADIOACTIVE SEED GUIDED PARTIAL MASTECTOMY WITH AXILLARY SENTINEL LYMPH NODE BIOPSY;  Surgeon: Susan Bookbinder, MD;  Location: [place];  Service: General;  Laterality: Right;  . REDUCTION MAMMAPLASTY Bilateral 2017    There were no vitals filed for this visit.  Subjective Assessment - 12/13/18 0806    Subjective  Pt reports she is doing her HEP, has some difficulty through her hips and back with the stretches.    Currently in Pain?  Yes    Pain Score  3     Pain Location  Back    Pain Descriptors / Indicators  Aching    Pain Type  Acute pain    Pain Onset  More than a month ago                       [facility] Adult PT Treatment/Exercise - 12/13/18 0001      Self-Care   Other Self-Care Comments   discussed walking every hour to keep the blood flowing and loosen up the muscles  Exercises   Exercises  Lumbar      Lumbar Exercises: Stretches   Single Knee to Chest Stretch  Left;Right;30 seconds    Lower Trunk Rotation  1 rep;30 seconds   to each side     Lumbar Exercises: Seated   Other Seated Lumbar Exercises  cervical side bend and flexion stretch each side and upper back stretch      Lumbar Exercises: Supine   Bridge  Compliant;20 reps   with bilat shoulder overhead reach   Other Supine Lumbar Exercises  10 reps head presses, shoulder presses, leg lengtheners and leg presses.       Lumbar Exercises: Sidelying   Other Sidelying Lumbar Exercises  open book exercise 2x10 each side, no wt d/t precautions Rt UE lymphedema                   PT Long Term Goals - 12/11/18 1450      PT LONG TERM GOAL #1   Title  Pt will be independent in her HEP and Progression.    Baseline  issued initial HEP on 12/11/2018    Time  6    Period  Weeks    Status  New    Target Date  01/22/19      PT LONG TERM GOAL #2   Title  Pt will be able to improve her cervical rotation to >/= 50 degrees bilaterally in order to improve functional mobility.    Baseline  R rotation: 30 degrees, L rotation: 35 degrees    Time  6    Period  Weeks    Status  New    Target Date  01/22/19      PT LONG TERM GOAL #3   Title  Pt will be able to improve hamstring flexiblity to >/= 80 degrees bilaterally to improve functional mobility.    Baseline  R: 60 degrees, L: 75 degrees    Time  6    Period  Weeks    Status  New    Target Date  01/22/19      PT LONG TERM GOAL #4   Title  pt will be able to amb community distances for grocery shopping >/= 30 minutes with pain </= 3/10 in her back.    Baseline  currently pt reporting pain can intermittently  increase to 7-8/10 in her back with everyday activities    Time  6    Period  Weeks    Status  New    Target Date  01/22/19      PT LONG TERM GOAL #5   Title  Pt will pick up a bag of groceries off the floor using her L UE with no back pain reported demonstrating correct lifting posture.    Baseline  pt reporting pain and difficutly reaching toward the floor    Time  6    Period  Weeks    Status  New    Target Date  01/22/19            Plan - 12/13/18 5681    Clinical Impression Statement  This is Christals first treatment visit.  She is doing well with her initial HEP and tolerated todays exercise without increase in back/neck pain. She does fatigue with core work and stretches.  Progress will be slower due to previous dx of CA and not being allowed to perform resisted work with Rt UE.    Rehab Potential  Good    PT  Frequency  2x / week    PT Duration  6 weeks    PT  Treatment/Interventions  ADLs/Self Care Home Management;Electrical Stimulation;Therapeutic exercise;Patient/family education;Manual techniques;Manual lymph drainage;Passive range of motion;Taping;Therapeutic activities;Functional mobility training    PT Next Visit Plan  lumbar stretching, core strengthening, cervical ROM       Patient will benefit from skilled therapeutic intervention in order to improve the following deficits and impairments:  Decreased activity tolerance, Decreased range of motion, Decreased strength, Pain, Difficulty walking, Postural dysfunction, Impaired flexibility  Visit Diagnosis: 1. Acute bilateral low back pain without sciatica   2. Cervicalgia   3. Muscle weakness (generalized)   4. Abnormal posture   5. Pain in thoracic spine        Problem List Patient Active Problem List   Diagnosis Date Noted  . Port catheter in place 12/28/2015  . Chemotherapy induced neutropenia ([place]) 05/14/2015  . Chemotherapy-induced neuropathy ([place]) 04/08/2015  . 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 Tooth ache 01/09/2015  . Genetic testing 12/26/2014  . Family history of breast cancer   . Malignant neoplasm of lower-inner quadrant of right breast of female, estrogen receptor negative ([place]) 11/25/2014  . Migraine 11/19/2014    Jeral Pinch PT  12/13/2018, 8:45 AM  [facility] [address] [place], [place], [address] Phone: [phone]   Fax:  [phone]  Name: Susan Wilson MRN: [id number] Date of Birth: 09/19/1975

## 2018-12-18 ENCOUNTER — Other Ambulatory Visit: Payer: Self-pay

## 2018-12-18 ENCOUNTER — Ambulatory Visit: Payer: Medicare Other | Attending: Oncology | Admitting: Physical Therapy

## 2018-12-18 ENCOUNTER — Encounter: Payer: Self-pay | Admitting: Physical Therapy

## 2018-12-18 DIAGNOSIS — M6281 Muscle weakness (generalized): Secondary | ICD-10-CM | POA: Insufficient documentation

## 2018-12-18 DIAGNOSIS — M25611 Stiffness of right shoulder, not elsewhere classified: Secondary | ICD-10-CM | POA: Diagnosis not present

## 2018-12-18 DIAGNOSIS — M25511 Pain in right shoulder: Secondary | ICD-10-CM | POA: Diagnosis not present

## 2018-12-18 DIAGNOSIS — M545 Low back pain, unspecified: Secondary | ICD-10-CM

## 2018-12-18 DIAGNOSIS — M546 Pain in thoracic spine: Secondary | ICD-10-CM | POA: Diagnosis not present

## 2018-12-18 DIAGNOSIS — R293 Abnormal posture: Secondary | ICD-10-CM

## 2018-12-18 DIAGNOSIS — M542 Cervicalgia: Secondary | ICD-10-CM | POA: Diagnosis not present

## 2018-12-18 NOTE — Therapy (Signed)
[place], [place], [address] Phone: [phone]   Fax:  [phone]  Physical Therapy Treatment  Patient Details  Name: MARGI EDMUNDSON MRN: [id number] Date of Birth: Mar 19, 1976 Referring Provider (PT): Lurline Del, MD   Encounter Date: 12/18/2018  PT End of Session - 12/18/18 0806    Visit Number  3    Number of Visits  12    Date for PT Re-Evaluation  02/11/19    Authorization Type  Medicare    PT Start Time  0803    PT Stop Time  0842    PT Time Calculation (min)  39 min    Activity Tolerance  Patient tolerated treatment well    Behavior During Therapy  [facility] for tasks assessed/performed       Past Medical History:  Diagnosis Date  . Anemia   . Breast asymmetry 12/2015  . Breast cancer ([place]) 2017   right breast  . History of breast cancer 11/2014   right  . History of chemotherapy   . Migraines   . 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 Personal history of chemotherapy 2017  . Personal history of radiation therapy 2017    Past Surgical History:  Procedure Laterality Date  . BREAST LUMPECTOMY Right 2017  . BREAST RECONSTRUCTION Right 06/23/2015   Procedure: ONCOPLASTIC RIGHT BREAST REDUCTION;  Surgeon: Irene Limbo, MD;  Location: [place];  Service: Plastics;  Laterality: Right;  . BREAST REDUCTION SURGERY Left 06/23/2015   Procedure: MAMMARY REDUCTION  (BREAST) LEFT BREAST FOR SYMETRY (BILATERAL BREAST REDUCTION);  Surgeon: Irene Limbo, MD;  Location: [place];  Service: Plastics;  Laterality: Left;  . BREAST REDUCTION SURGERY Left 01/15/2016   Procedure: MAMMARY REDUCTION  (BREAST) LEFT;  Surgeon: Irene Limbo, MD;  Location: [place];  Service: Plastics;  Laterality: Left;  Marland Kitchen MASTOPEXY Left 01/15/2016   Procedure: POSSIBLE LEFT MASTOPEXY;  Surgeon: Irene Limbo, MD;  Location: [place];  Service: Plastics;  Laterality: Left;  . PORT-A-CATH REMOVAL Right 01/15/2016   Procedure: REMOVAL OF RIGHT PORT-A-CATH;  Surgeon: Irene Limbo, MD;  Location: [place];  Service: Plastics;  Laterality: Right;  . PORTACATH PLACEMENT Right 12/16/2014   Procedure: INSERTION PORT-A-CATH WITH ULTRASOUND;  Surgeon: Rolm Bookbinder, MD;  Location: [place];  Service: General;  Laterality: Right;  . RADIOACTIVE SEED GUIDED PARTIAL MASTECTOMY WITH AXILLARY SENTINEL LYMPH NODE BIOPSY Right 06/15/2015   Procedure: RADIOACTIVE SEED GUIDED PARTIAL MASTECTOMY WITH AXILLARY SENTINEL LYMPH NODE BIOPSY;  Surgeon: Rolm Bookbinder, MD;  Location: [place];  Service: General;  Laterality: Right;  . REDUCTION MAMMAPLASTY Bilateral 2017    There were no vitals filed for this visit.  Subjective Assessment - 12/18/18 0806    Subjective  I have been swollen and sore    Patient Stated Goals  Be able to walk and do everyday activities without pain    Currently in Pain?  Yes    Pain Score  3     Pain Location  Back    Pain Orientation  Right;Mid    Pain Descriptors / Indicators  Sore    Aggravating Factors   standing, walking, lifting bending- "still about the same"    Pain Relieving Factors  rest                       [facility]  Adult PT Treatment/Exercise - 12/18/18 0001      Lumbar Exercises: Stretches   Quadruped Mid Back Stretch Limitations  child pose      Lumbar Exercises: Aerobic   UBE (Upper Arm Bike)  5 min L4 UE & LE      Lumbar Exercises: Supine   Other Supine Lumbar Exercises  belly breathing      Shoulder Exercises: Supine   Horizontal ABduction  10 reps;Theraband    Theraband Level (Shoulder Horizontal ABduction)  Level 1 (Yellow)    Flexion Limitations  holding gentle horiz abd on yellow tband      Shoulder Exercises: Seated   Other Seated Exercises  lat pull down motion      Shoulder Exercises: Prone   Other Prone  Exercises  qped lawn mower yellow tband      Shoulder Exercises: Stretch   Other Shoulder Stretches  supine pec stretch with belly breathing    Other Shoulder Stretches  upper trap & levator stretch      Manual Therapy   Manual therapy comments  edu on self use of theracane             PT Education - 12/18/18 0844    Education Details  theracane, whiplash, hair style affecting neck pain    Person(s) Educated  Patient    Methods  Explanation;Demonstration;Tactile cues;Verbal cues;Handout    Comprehension  Verbalized understanding;Returned demonstration;Verbal cues required;Tactile cues required;Need further instruction          PT Long Term Goals - 12/11/18 1450      PT LONG TERM GOAL #1   Title  Pt will be independent in her HEP and Progression.    Baseline  issued initial HEP on 12/11/2018    Time  6    Period  Weeks    Status  New    Target Date  01/22/19      PT LONG TERM GOAL #2   Title  Pt will be able to improve her cervical rotation to >/= 50 degrees bilaterally in order to improve functional mobility.    Baseline  R rotation: 30 degrees, L rotation: 35 degrees    Time  6    Period  Weeks    Status  New    Target Date  01/22/19      PT LONG TERM GOAL #3   Title  Pt will be able to improve hamstring flexiblity to >/= 80 degrees bilaterally to improve functional mobility.    Baseline  R: 60 degrees, L: 75 degrees    Time  6    Period  Weeks    Status  New    Target Date  01/22/19      PT LONG TERM GOAL #4   Title  pt will be able to amb community distances for grocery shopping >/= 30 minutes with pain </= 3/10 in her back.    Baseline  currently pt reporting pain can intermittently  increase to 7-8/10 in her back with everyday activities    Time  6    Period  Weeks    Status  New    Target Date  01/22/19      PT LONG TERM GOAL #5   Title  Pt will pick up a bag of groceries off the floor using her L UE with no back pain reported demonstrating correct  lifting posture.    Baseline  pt reporting pain and difficutly reaching toward the floor  Time  6    Period  Weeks    Status  New    Target Date  01/22/19            Plan - 12/18/18 0842    Clinical Impression Statement  Discussed anatomy of whiplash & time required to heal. good tolerance to exercise with repetitions limited to approx 10 per set due to fatigue.    PT Treatment/Interventions  ADLs/Self Care Home Management;Electrical Stimulation;Therapeutic exercise;Patient/family education;Manual techniques;Manual lymph drainage;Passive range of motion;Taping;Therapeutic activities;Functional mobility training    PT Next Visit Plan  large arc weighted motions    PT Home Exercise Plan  child pose, belly breathing, supine chest stretch, supine horiz abd, lat pull down motion    Consulted and Agree with Plan of Care  Patient       Patient will benefit from skilled therapeutic intervention in order to improve the following deficits and impairments:  Decreased activity tolerance, Decreased range of motion, Decreased strength, Pain, Difficulty walking, Postural dysfunction, Impaired flexibility  Visit Diagnosis: 1. Acute bilateral low back pain without sciatica   2. Cervicalgia   3. Muscle weakness (generalized)   4. Abnormal posture   5. Pain in thoracic spine        Problem List Patient Active Problem List   Diagnosis Date Noted  . Port catheter in place 12/28/2015  . Chemotherapy induced neutropenia ([place]) 05/14/2015  . Chemotherapy-induced neuropathy ([place]) 04/08/2015  . 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 Tooth ache 01/09/2015  . Genetic testing 12/26/2014  . Family history of breast cancer   . Malignant neoplasm of lower-inner quadrant of right breast of female, estrogen receptor negative ([place]) 11/25/2014  . Migraine 11/19/2014    Keelan Tripodi C. Levi Klaiber PT, DPT 12/18/18 10:30 AM   [facility] [address] [place], [place], [address] Phone: [phone]   Fax:  [phone]  Name: BEULA JOYNER MRN: [id number] Date of Birth: [birth year]

## 2018-12-20 ENCOUNTER — Ambulatory Visit: Payer: Medicare Other | Admitting: Physical Therapy

## 2018-12-25 ENCOUNTER — Ambulatory Visit: Payer: Medicare Other | Admitting: Physical Therapy

## 2018-12-25 ENCOUNTER — Encounter: Payer: Self-pay | Admitting: Physical Therapy

## 2018-12-25 ENCOUNTER — Other Ambulatory Visit: Payer: Self-pay

## 2018-12-25 DIAGNOSIS — M542 Cervicalgia: Secondary | ICD-10-CM | POA: Diagnosis not present

## 2018-12-25 DIAGNOSIS — M25611 Stiffness of right shoulder, not elsewhere classified: Secondary | ICD-10-CM | POA: Diagnosis not present

## 2018-12-25 DIAGNOSIS — M6281 Muscle weakness (generalized): Secondary | ICD-10-CM

## 2018-12-25 DIAGNOSIS — M545 Low back pain, unspecified: Secondary | ICD-10-CM

## 2018-12-25 DIAGNOSIS — R293 Abnormal posture: Secondary | ICD-10-CM | POA: Diagnosis not present

## 2018-12-25 DIAGNOSIS — M546 Pain in thoracic spine: Secondary | ICD-10-CM | POA: Diagnosis not present

## 2018-12-25 NOTE — Therapy (Signed)
[place], [place], [address] Phone: [phone]   Fax:  [phone]  Physical Therapy Treatment  Patient Details  Name: Susan Wilson MRN: [id number] Date of Birth: 06/19/1975 Referring Provider (PT): Lurline Del, MD   Encounter Date: 12/25/2018  PT End of Session - 12/25/18 1337    Visit Number  4    Number of Visits  12    Date for PT Re-Evaluation  02/11/19    Authorization Type  Medicare    PT Start Time  1335   pt arrived late   PT Stop Time  1413    PT Time Calculation (min)  38 min    Activity Tolerance  Patient tolerated treatment well    Behavior During Therapy  [facility] for tasks assessed/performed       Past Medical History:  Diagnosis Date  . Anemia   . Breast asymmetry 12/2015  . Breast cancer ([place]) 2017   right breast  . History of breast cancer 11/2014   right  . History of chemotherapy   . Migraines   . 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 Personal history of chemotherapy 2017  . Personal history of radiation therapy 2017    Past Surgical History:  Procedure Laterality Date  . BREAST LUMPECTOMY Right 2017  . BREAST RECONSTRUCTION Right 06/23/2015   Procedure: ONCOPLASTIC RIGHT BREAST REDUCTION;  Surgeon: Irene Limbo, MD;  Location: [place];  Service: Plastics;  Laterality: Right;  . BREAST REDUCTION SURGERY Left 06/23/2015   Procedure: MAMMARY REDUCTION  (BREAST) LEFT BREAST FOR SYMETRY (BILATERAL BREAST REDUCTION);  Surgeon: Irene Limbo, MD;  Location: [place];  Service: Plastics;  Laterality: Left;  . BREAST REDUCTION SURGERY Left 01/15/2016   Procedure: MAMMARY REDUCTION  (BREAST) LEFT;  Surgeon: Irene Limbo, MD;  Location: [place];  Service: Plastics;  Laterality: Left;  Marland Kitchen MASTOPEXY Left 01/15/2016   Procedure: POSSIBLE LEFT MASTOPEXY;  Surgeon: Irene Limbo, MD;   Location: [place];  Service: Plastics;  Laterality: Left;  . PORT-A-CATH REMOVAL Right 01/15/2016   Procedure: REMOVAL OF RIGHT PORT-A-CATH;  Surgeon: Irene Limbo, MD;  Location: [place];  Service: Plastics;  Laterality: Right;  . PORTACATH PLACEMENT Right 12/16/2014   Procedure: INSERTION PORT-A-CATH WITH ULTRASOUND;  Surgeon: Rolm Bookbinder, MD;  Location: [place];  Service: General;  Laterality: Right;  . RADIOACTIVE SEED GUIDED PARTIAL MASTECTOMY WITH AXILLARY SENTINEL LYMPH NODE BIOPSY Right 06/15/2015   Procedure: RADIOACTIVE SEED GUIDED PARTIAL MASTECTOMY WITH AXILLARY SENTINEL LYMPH NODE BIOPSY;  Surgeon: Rolm Bookbinder, MD;  Location: [place];  Service: General;  Laterality: Right;  . REDUCTION MAMMAPLASTY Bilateral 2017    There were no vitals filed for this visit.  Subjective Assessment - 12/25/18 1339    Subjective  A little sore today, spasmed for about 2 days after last visit- thinks it was from the quadruped position.    Patient Stated Goals  Be able to walk and do everyday activities without pain                       [facility] Adult PT Treatment/Exercise - 12/25/18 0001      Lumbar Exercises: Aerobic   UBE (Upper Arm Bike)  5 min L5 Ue & LE      Shoulder Exercises: ROM/Strengthening   UBE (Upper Arm Bike)  retro 3 min  L1    Other ROM/Strengthening Exercises  FM-3lb lat pull, row, step back with iso row, biceps, triceps, D2 extension      Shoulder Exercises: Stretch   Table Stretch -Flexion Limitations  bent at 90 deg                  PT Long Term Goals - 12/11/18 1450      PT LONG TERM GOAL #1   Title  Pt will be independent in her HEP and Progression.    Baseline  issued initial HEP on 12/11/2018    Time  6    Period  Weeks    Status  New    Target Date  01/22/19      PT LONG TERM GOAL #2   Title  Pt will be able to improve her cervical rotation to >/= 50 degrees bilaterally in  order to improve functional mobility.    Baseline  R rotation: 30 degrees, L rotation: 35 degrees    Time  6    Period  Weeks    Status  New    Target Date  01/22/19      PT LONG TERM GOAL #3   Title  Pt will be able to improve hamstring flexiblity to >/= 80 degrees bilaterally to improve functional mobility.    Baseline  R: 60 degrees, L: 75 degrees    Time  6    Period  Weeks    Status  New    Target Date  01/22/19      PT LONG TERM GOAL #4   Title  pt will be able to amb community distances for grocery shopping >/= 30 minutes with pain </= 3/10 in her back.    Baseline  currently pt reporting pain can intermittently  increase to 7-8/10 in her back with everyday activities    Time  6    Period  Weeks    Status  New    Target Date  01/22/19      PT LONG TERM GOAL #5   Title  Pt will pick up a bag of groceries off the floor using her L UE with no back pain reported demonstrating correct lifting posture.    Baseline  pt reporting pain and difficutly reaching toward the floor    Time  6    Period  Weeks    Status  New    Target Date  01/22/19            Plan - 12/25/18 1359    Clinical Impression Statement  Pt reported low tolerance to CKC exercises so we focused on light weight using free motion machine. some cues required to decrease GHJ elevation. Asked her to work on large range AROM in mirror with no weight but high repetition without GHJ elevation.    PT Treatment/Interventions  ADLs/Self Care Home Management;Electrical Stimulation;Therapeutic exercise;Patient/family education;Manual techniques;Manual lymph drainage;Passive range of motion;Taping;Therapeutic activities;Functional mobility training    PT Next Visit Plan  progress HEP if exercises tolerated from 8/11    PT Home Exercise Plan  child pose, belly breathing, supine chest stretch, supine horiz abd, lat pull down motion    Consulted and Agree with Plan of Care  Patient       Patient will benefit from  skilled therapeutic intervention in order to improve the following deficits and impairments:  Decreased activity tolerance, Decreased range of motion, Decreased strength, Pain, Difficulty walking, Postural dysfunction, Impaired flexibility  Visit Diagnosis: 1.  Acute bilateral low back pain without sciatica   2. Cervicalgia   3. Muscle weakness (generalized)   4. Abnormal posture   5. Pain in thoracic spine        Problem List Patient Active Problem List   Diagnosis Date Noted  . Port catheter in place 12/28/2015  . Chemotherapy induced neutropenia ([place]) 05/14/2015  . Chemotherapy-induced neuropathy ([place]) 04/08/2015  . 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 Tooth ache 01/09/2015  . Genetic testing 12/26/2014  . Family history of breast cancer   . Malignant neoplasm of lower-inner quadrant of right breast of female, estrogen receptor negative ([place]) 11/25/2014  . Migraine 11/19/2014    Izabel Chim C. Clay Menser PT, DPT 12/25/18 2:14 PM   [facility] [address] [place], [place], [address] Phone: [phone]   Fax:  [phone]  Name: Susan Wilson MRN: [id number] Date of Birth: 01/08/1976

## 2018-12-27 ENCOUNTER — Ambulatory Visit: Payer: Medicare Other | Admitting: Physical Therapy

## 2018-12-27 ENCOUNTER — Encounter: Payer: Self-pay | Admitting: Physical Therapy

## 2018-12-27 ENCOUNTER — Other Ambulatory Visit: Payer: Self-pay

## 2018-12-27 DIAGNOSIS — M545 Low back pain, unspecified: Secondary | ICD-10-CM

## 2018-12-27 DIAGNOSIS — M25611 Stiffness of right shoulder, not elsewhere classified: Secondary | ICD-10-CM | POA: Diagnosis not present

## 2018-12-27 DIAGNOSIS — M542 Cervicalgia: Secondary | ICD-10-CM | POA: Diagnosis not present

## 2018-12-27 DIAGNOSIS — R293 Abnormal posture: Secondary | ICD-10-CM

## 2018-12-27 DIAGNOSIS — M6281 Muscle weakness (generalized): Secondary | ICD-10-CM

## 2018-12-27 DIAGNOSIS — M546 Pain in thoracic spine: Secondary | ICD-10-CM | POA: Diagnosis not present

## 2018-12-27 NOTE — Therapy (Signed)
[place], [place], [address] Phone: [phone]   Fax:  [phone]  Physical Therapy Treatment  Patient Details  Name: Susan Wilson MRN: [id number] Date of Birth: Jun 07, 1975 Referring Provider (PT): Lurline Del, MD   Encounter Date: 12/27/2018  PT End of Session - 12/27/18 1506    Visit Number  5    Number of Visits  12    Date for PT Re-Evaluation  02/11/19    Authorization Type  Medicare    PT Start Time  1505    PT Stop Time  1541    PT Time Calculation (min)  36 min    Activity Tolerance  Patient tolerated treatment well    Behavior During Therapy  [facility] for tasks assessed/performed       Past Medical History:  Diagnosis Date  . Anemia   . Breast asymmetry 12/2015  . Breast cancer ([place]) 2017   right breast  . History of breast cancer 11/2014   right  . History of chemotherapy   . Migraines   . 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 Personal history of chemotherapy 2017  . Personal history of radiation therapy 2017    Past Surgical History:  Procedure Laterality Date  . BREAST LUMPECTOMY Right 2017  . BREAST RECONSTRUCTION Right 06/23/2015   Procedure: ONCOPLASTIC RIGHT BREAST REDUCTION;  Surgeon: Irene Limbo, MD;  Location: [place];  Service: Plastics;  Laterality: Right;  . BREAST REDUCTION SURGERY Left 06/23/2015   Procedure: MAMMARY REDUCTION  (BREAST) LEFT BREAST FOR SYMETRY (BILATERAL BREAST REDUCTION);  Surgeon: Irene Limbo, MD;  Location: [place];  Service: Plastics;  Laterality: Left;  . BREAST REDUCTION SURGERY Left 01/15/2016   Procedure: MAMMARY REDUCTION  (BREAST) LEFT;  Surgeon: Irene Limbo, MD;  Location: [place];  Service: Plastics;  Laterality: Left;  Marland Kitchen MASTOPEXY Left 01/15/2016   Procedure: POSSIBLE LEFT MASTOPEXY;  Surgeon: Irene Limbo, MD;  Location: [place];  Service: Plastics;  Laterality: Left;  . PORT-A-CATH REMOVAL Right 01/15/2016   Procedure: REMOVAL OF RIGHT PORT-A-CATH;  Surgeon: Irene Limbo, MD;  Location: [place];  Service: Plastics;  Laterality: Right;  . PORTACATH PLACEMENT Right 12/16/2014   Procedure: INSERTION PORT-A-CATH WITH ULTRASOUND;  Surgeon: Rolm Bookbinder, MD;  Location: [place];  Service: General;  Laterality: Right;  . RADIOACTIVE SEED GUIDED PARTIAL MASTECTOMY WITH AXILLARY SENTINEL LYMPH NODE BIOPSY Right 06/15/2015   Procedure: RADIOACTIVE SEED GUIDED PARTIAL MASTECTOMY WITH AXILLARY SENTINEL LYMPH NODE BIOPSY;  Surgeon: Rolm Bookbinder, MD;  Location: [place];  Service: General;  Laterality: Right;  . REDUCTION MAMMAPLASTY Bilateral 2017    There were no vitals filed for this visit.  Subjective Assessment - 12/27/18 1506    Subjective  Just the normal spasm with putting away groceries and doing laundry.    Patient Stated Goals  Be able to walk and do everyday activities without pain    Currently in Pain?  Yes    Pain Score  3     Pain Location  Back    Pain Orientation  Right;Mid    Pain Descriptors / Indicators  Sore         [facility] PT Assessment - 12/27/18 0001      Assessment   Medical Diagnosis  lumbar, thoracic, and cervical back pain    Referring Provider (PT)  Magrinat, Sarajane Jews, MD  Onset Date/Surgical Date  10/29/18    Hand Dominance  Right    Prior Therapy  yes, cancer rehab for lymphadema                   [facility] Adult PT Treatment/Exercise - 12/27/18 0001      Lumbar Exercises: Aerobic   UBE (Upper Arm Bike)  3 min L1 retro      Manual Therapy   Manual Therapy  Joint mobilization    Joint Mobilization  grade 2 thoracic PA, Rt rib ER    Soft tissue mobilization  Rt periscap    Passive ROM  scapular mobility             PT Education - 12/27/18 1544    Education Details  functional movements for exercise. anatomy of  condition, hep    Person(s) Educated  Patient    Methods  Explanation;Handout    Comprehension  Verbalized understanding          PT Long Term Goals - 12/11/18 1450      PT LONG TERM GOAL #1   Title  Pt will be independent in her HEP and Progression.    Baseline  issued initial HEP on 12/11/2018    Time  6    Period  Weeks    Status  New    Target Date  01/22/19      PT LONG TERM GOAL #2   Title  Pt will be able to improve her cervical rotation to >/= 50 degrees bilaterally in order to improve functional mobility.    Baseline  R rotation: 30 degrees, L rotation: 35 degrees    Time  6    Period  Weeks    Status  New    Target Date  01/22/19      PT LONG TERM GOAL #3   Title  Pt will be able to improve hamstring flexiblity to >/= 80 degrees bilaterally to improve functional mobility.    Baseline  R: 60 degrees, L: 75 degrees    Time  6    Period  Weeks    Status  New    Target Date  01/22/19      PT LONG TERM GOAL #4   Title  pt will be able to amb community distances for grocery shopping >/= 30 minutes with pain </= 3/10 in her back.    Baseline  currently pt reporting pain can intermittently  increase to 7-8/10 in her back with everyday activities    Time  6    Period  Weeks    Status  New    Target Date  01/22/19      PT LONG TERM GOAL #5   Title  Pt will pick up a bag of groceries off the floor using her L UE with no back pain reported demonstrating correct lifting posture.    Baseline  pt reporting pain and difficutly reaching toward the floor    Time  6    Period  Weeks    Status  New    Target Date  01/22/19            Plan - 12/27/18 1542    Clinical Impression Statement  Able to find trigger points in Rt subscap, rhomboids and paraspinals that created concordant pain and were addressed with manual therapy. Reported feeling looser and advised that she will likely see a bruise- bruises easily with lymphedema. Added exercises to HEP with redband.  PT  Treatment/Interventions  ADLs/Self Care Home Management;Electrical Stimulation;Therapeutic exercise;Patient/family education;Manual techniques;Manual lymph drainage;Passive range of motion;Taping;Therapeutic activities;Functional mobility training    PT Next Visit Plan  review HEP exercises    PT Home Exercise Plan  child pose, belly breathing, supine chest stretch, supine horiz abd, lat pull down motion, row, biceps/triceps band, D1 & D2    Consulted and Agree with Plan of Care  Patient       Patient will benefit from skilled therapeutic intervention in order to improve the following deficits and impairments:  Decreased activity tolerance, Decreased range of motion, Decreased strength, Pain, Difficulty walking, Postural dysfunction, Impaired flexibility  Visit Diagnosis: 1. Acute bilateral low back pain without sciatica   2. Cervicalgia   3. Muscle weakness (generalized)   4. Abnormal posture   5. Pain in thoracic spine        Problem List Patient Active Problem List   Diagnosis Date Noted  . Port catheter in place 12/28/2015  . Chemotherapy induced neutropenia ([place]) 05/14/2015  . 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 Tooth ache 01/09/2015  . Genetic testing 12/26/2014  . Family history of breast cancer   . Malignant neoplasm of lower-inner quadrant of right breast of female, estrogen receptor negative ([place]) 11/25/2014  . Migraine 11/19/2014   Jaleena Viviani C. Kamira Mellette PT, DPT 12/27/18 3:51 PM   [facility] [address] [place], [place], [address] Phone: [phone]   Fax:  [phone]  Name: ARIELYS WANDERSEE MRN: [id number] Date of Birth: [birth year]

## 2018-12-31 ENCOUNTER — Other Ambulatory Visit: Payer: Self-pay

## 2018-12-31 ENCOUNTER — Encounter: Payer: Self-pay | Admitting: Obstetrics & Gynecology

## 2018-12-31 ENCOUNTER — Ambulatory Visit (INDEPENDENT_AMBULATORY_CARE_PROVIDER_SITE_OTHER): Payer: Medicare Other | Admitting: Obstetrics & Gynecology

## 2018-12-31 ENCOUNTER — Other Ambulatory Visit (HOSPITAL_COMMUNITY)
Admission: RE | Admit: 2018-12-31 | Discharge: 2018-12-31 | Disposition: A | Discharge status: Home / Self Care | Payer: Medicare Other | Source: Ambulatory Visit | Attending: Obstetrics & Gynecology | Admitting: Obstetrics & Gynecology

## 2018-12-31 VITALS — BP 136/90 | HR 79 | Temp 98.8°F

## 2018-12-31 DIAGNOSIS — Z01419 Encounter for gynecological examination (general) (routine) without abnormal findings: Secondary | ICD-10-CM | POA: Diagnosis not present

## 2018-12-31 DIAGNOSIS — Z1231 Encounter for screening mammogram for malignant neoplasm of breast: Secondary | ICD-10-CM | POA: Diagnosis not present

## 2018-12-31 NOTE — Progress Notes (Signed)
GYNECOLOGY ANNUAL PREVENTATIVE CARE ENCOUNTER NOTE  History:     Susan Wilson is a 43 y.o. G22P2000 female here for a routine annual gynecologic exam.  Current complaints: Reports some cramping and pain and bleeding with her cycles. Very irregular cycles; only noted after chemotherapy which finished over one year ago.  Denies abnormal vaginal discharge, pelvic pain, problems with intercourse or other gynecologic concerns.    Gynecologic History Patient's last menstrual period was 12/30/2018 (exact date). Last 3-4 days. Sometimes very heavy and sometimes just spotting. Contraception: Would still like IUD but needs to fill out different paperwork for Medicare to approve this. Last Pap: 2017 per patient and normal; will repeat today  Last mammogram: October 2019. Results were: BI-RADS 2; repeat in 1 year.   Obstetric History OB History  Gravida Para Term Preterm AB Living  2 2 2         SAB TAB Ectopic Multiple Live Births               # Outcome Date GA Lbr Len/2nd Weight Sex Delivery Anes PTL Lv  2 Term      Vag-Spont     1 Term      Vag-Spont       Past Medical History:  Diagnosis Date  . Anemia   . Breast asymmetry 12/2015  . Breast cancer ([place]) 2017   right breast  . History of breast cancer 11/2014   right  . History of chemotherapy   . Migraines   . 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 Personal history of chemotherapy 2017  . Personal history of radiation therapy 2017    Past Surgical History:  Procedure Laterality Date  . BREAST LUMPECTOMY Right 2017  . BREAST RECONSTRUCTION Right 06/23/2015   Procedure: ONCOPLASTIC RIGHT BREAST REDUCTION;  Surgeon: Irene Limbo, MD;  Location: [place];  Service: Plastics;  Laterality: Right;  . BREAST REDUCTION SURGERY Left 06/23/2015   Procedure: MAMMARY REDUCTION  (BREAST) LEFT BREAST FOR SYMETRY (BILATERAL BREAST REDUCTION);  Surgeon: Irene Limbo, MD;  Location:  [place];  Service: Plastics;  Laterality: Left;  . BREAST REDUCTION SURGERY Left 01/15/2016   Procedure: MAMMARY REDUCTION  (BREAST) LEFT;  Surgeon: Irene Limbo, MD;  Location: [place];  Service: Plastics;  Laterality: Left;  Marland Kitchen MASTOPEXY Left 01/15/2016   Procedure: POSSIBLE LEFT MASTOPEXY;  Surgeon: Irene Limbo, MD;  Location: [place];  Service: Plastics;  Laterality: Left;  . PORT-A-CATH REMOVAL Right 01/15/2016   Procedure: REMOVAL OF RIGHT PORT-A-CATH;  Surgeon: Irene Limbo, MD;  Location: [place];  Service: Plastics;  Laterality: Right;  . PORTACATH PLACEMENT Right 12/16/2014   Procedure: INSERTION PORT-A-CATH WITH ULTRASOUND;  Surgeon: Rolm Bookbinder, MD;  Location: [facility];  Service: General;  Laterality: Right;  . RADIOACTIVE SEED GUIDED PARTIAL MASTECTOMY WITH AXILLARY SENTINEL LYMPH NODE BIOPSY Right 06/15/2015   Procedure: RADIOACTIVE SEED GUIDED PARTIAL MASTECTOMY WITH AXILLARY SENTINEL LYMPH NODE BIOPSY;  Surgeon: Rolm Bookbinder, MD;  Location: [place];  Service: General;  Laterality: Right;  . REDUCTION MAMMAPLASTY Bilateral 2017    Current Outpatient Medications on File Prior to Visit  Medication Sig Dispense Refill  . b complex vitamins tablet Take 1 tablet by mouth daily.    . cyclobenzaprine (FLEXERIL) 10 MG tablet Take 1 tablet (10 mg total) by mouth 2 (two) times daily as needed for muscle spasms. Hermine Feria  tablet 0  . ferrous sulfate 325 (65 FE) MG tablet Take 325 mg by mouth daily with breakfast.    . gabapentin (NEURONTIN) 300 MG capsule Take 1 capsule (300 mg total) by mouth at bedtime. 90 capsule 4  . LORazepam (ATIVAN) 0.5 MG tablet Take 1 tablet (0.5 mg total) by mouth at bedtime. 30 tablet 0  . meloxicam (MOBIC) 15 MG tablet Take 1 tablet (15 mg total) by mouth daily. 60 tablet 5  . nortriptyline (PAMELOR) 10 MG capsule Start nortriptyline 7m at bedtime for 2 week, then  increase to 2 tablet at bedtime 60 capsule 5  . ondansetron (ZOFRAN) 4 MG tablet TAKE 1 TABLET BY MOUTH EVERY 8 HOURS AS NEEDED FOR NAUSEA OR VOMITING 30 tablet 0  . SUMAtriptan (IMITREX) 50 MG tablet Take 1 tablet (50 mg total) by mouth every 2 (two) hours as needed for migraine. Mayrepeat in 2 hrs if needed. 10 tablet 0  . vitamin E (VITAMIN E) 400 UNIT capsule Take 1 capsule (400 Units total) by mouth daily. 30 capsule 0   No current facility-administered medications on file prior to visit.     No Known Allergies  Social History:  reports that she has never smoked. She has never used smokeless tobacco. She reports current alcohol use. She reports that she does not use drugs.  Family History  Problem Relation Age of Onset  . Mesothelioma Maternal Grandfather        asbestos exposure, died in his 529s . Heart defect Sister 0  . Breast cancer Paternal Grandmother   . Diabetes Paternal Grandmother   . Cancer Paternal Grandfather        NOS  . Colon cancer Other 637      MGMs brother with colon cancer  . Cancer Other        several of MGF's sisters with cancer NOS  . Liver cancer Other        MGF's brother  . Heart disease Paternal Aunt   . Heart disease Paternal Uncle     The following portions of the patient's history were reviewed and updated as appropriate: allergies, current medications, past family history, past medical history, past social history, past surgical history and problem list.  Review of Systems Pertinent items noted in HPI and remainder of comprehensive ROS otherwise negative.  Physical Exam:  BP 136/90   Pulse 79   Temp 98.8 F (37.1 C)   LMP 12/30/2018 (Exact Date)  CONSTITUTIONAL: Well-developed, well-nourished female in no acute distress.  HENT:  Normocephalic, atraumatic, External right and left ear normal. Oropharynx is clear and moist EYES: Conjunctivae and EOM are normal. Pupils are equal, round, and reactive to light. No scleral icterus.  NECK:  Normal range of motion, supple, no masses.  Normal thyroid.  SKIN: Skin is warm and dry. No rash noted. Not diaphoretic. No erythema. No pallor. MUSCULOSKELETAL: Normal range of motion. No tenderness.  No cyanosis, clubbing, or edema.  2+ distal pulses. NEUROLOGIC: Alert and oriented to person, place, and time. Normal reflexes, muscle tone coordination. No cranial nerve deficit noted. PSYCHIATRIC: Normal mood and affect. Normal behavior. Normal judgment and thought content. CARDIOVASCULAR: Normal heart rate noted, regular rhythm RESPIRATORY: Clear to auscultation bilaterally. Effort and breath sounds normal, no problems with respiration noted. BREASTS: Deferred; performed by Med-Oncologist Feb 2020 ABDOMEN: Soft, normal bowel sounds, no distention noted.  No tenderness, rebound or guarding.  PELVIC: Normal appearing external genitalia; normal appearing vaginal mucosa and cervix.  No abnormal discharge noted.  Pap smear obtained.  Normal uterine size, no other palpable masses, no uterine or adnexal tenderness.   Assessment and Plan:    1. Women's annual routine gynecological examination - hx of breast cancer  - Mammogram ordered for yearly evaluation in October - Cytology - PAP - Will plan for IUD once paperwork complete - RTC in 1 year or for IUD when able   Will follow up results of pap smear and manage accordingly. Routine preventative health maintenance measures emphasized. Please refer to After Visit Summary for other counseling recommendations.      Barrington Ellison, MD [facility] Family Medicine Fellow, [facility] for [employer], [place]

## 2019-01-01 ENCOUNTER — Encounter: Payer: Self-pay | Admitting: Physical Therapy

## 2019-01-01 ENCOUNTER — Ambulatory Visit: Payer: Medicare Other | Admitting: Physical Therapy

## 2019-01-01 DIAGNOSIS — M542 Cervicalgia: Secondary | ICD-10-CM | POA: Diagnosis not present

## 2019-01-01 DIAGNOSIS — R293 Abnormal posture: Secondary | ICD-10-CM

## 2019-01-01 DIAGNOSIS — M545 Low back pain, unspecified: Secondary | ICD-10-CM

## 2019-01-01 DIAGNOSIS — M25611 Stiffness of right shoulder, not elsewhere classified: Secondary | ICD-10-CM | POA: Diagnosis not present

## 2019-01-01 DIAGNOSIS — M6281 Muscle weakness (generalized): Secondary | ICD-10-CM

## 2019-01-01 DIAGNOSIS — M546 Pain in thoracic spine: Secondary | ICD-10-CM | POA: Diagnosis not present

## 2019-01-01 NOTE — Therapy (Signed)
[place], [place], [address] Phone: [phone]   Fax:  [phone]  Physical Therapy Treatment  Patient Details  Name: Susan Wilson MRN: [id number] Date of Birth: [birth year] Referring Provider (PT): Lurline Del, MD   Encounter Date: 01/01/2019  PT End of Session - 01/01/19 0812    Visit Number  6    Number of Visits  12    Date for PT Re-Evaluation  02/11/19    Authorization Type  Medicare    PT Start Time  0809   pt arrived late   PT Stop Time  0845    PT Time Calculation (min)  36 min    Activity Tolerance  Patient tolerated treatment well    Behavior During Therapy  [facility] for tasks assessed/performed       Past Medical History:  Diagnosis Date  . Anemia   . Breast asymmetry 12/2015  . Breast cancer ([place]) 2017   right breast  . History of breast cancer 11/2014   right  . History of chemotherapy   . Migraines   . 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 Personal history of chemotherapy 2017  . Personal history of radiation therapy 2017    Past Surgical History:  Procedure Laterality Date  . BREAST LUMPECTOMY Right 2017  . BREAST RECONSTRUCTION Right 06/23/2015   Procedure: ONCOPLASTIC RIGHT BREAST REDUCTION;  Surgeon: Irene Limbo, MD;  Location: [place];  Service: Plastics;  Laterality: Right;  . BREAST REDUCTION SURGERY Left 06/23/2015   Procedure: MAMMARY REDUCTION  (BREAST) LEFT BREAST FOR SYMETRY (BILATERAL BREAST REDUCTION);  Surgeon: Irene Limbo, MD;  Location: [place];  Service: Plastics;  Laterality: Left;  . BREAST REDUCTION SURGERY Left 01/15/2016   Procedure: MAMMARY REDUCTION  (BREAST) LEFT;  Surgeon: Irene Limbo, MD;  Location: [place];  Service: Plastics;  Laterality: Left;  Marland Kitchen MASTOPEXY Left 01/15/2016   Procedure: POSSIBLE LEFT MASTOPEXY;  Surgeon: Irene Limbo, MD;   Location: [place];  Service: Plastics;  Laterality: Left;  . PORT-A-CATH REMOVAL Right 01/15/2016   Procedure: REMOVAL OF RIGHT PORT-A-CATH;  Surgeon: Irene Limbo, MD;  Location: [place];  Service: Plastics;  Laterality: Right;  . PORTACATH PLACEMENT Right 12/16/2014   Procedure: INSERTION PORT-A-CATH WITH ULTRASOUND;  Surgeon: Rolm Bookbinder, MD;  Location: [place];  Service: General;  Laterality: Right;  . RADIOACTIVE SEED GUIDED PARTIAL MASTECTOMY WITH AXILLARY SENTINEL LYMPH NODE BIOPSY Right 06/15/2015   Procedure: RADIOACTIVE SEED GUIDED PARTIAL MASTECTOMY WITH AXILLARY SENTINEL LYMPH NODE BIOPSY;  Surgeon: Rolm Bookbinder, MD;  Location: [place];  Service: General;  Laterality: Right;  . REDUCTION MAMMAPLASTY Bilateral 2017    There were no vitals filed for this visit.  Subjective Assessment - 01/01/19 0811    Subjective  A little spasm after last time but not bad, some soreness    Patient Stated Goals  Be able to walk and do everyday activities without pain    Currently in Pain?  Yes    Pain Score  3     Pain Location  Back    Pain Orientation  Right;Upper    Pain Descriptors / Indicators  Sore    Pain Radiating Towards  Rt shoulder                       [facility] Adult PT Treatment/Exercise - 01/01/19  0001      Shoulder Exercises: Supine   Horizontal ABduction  15 reps    Theraband Level (Shoulder Horizontal ABduction)  Level 2 (Red)    Theraband Level (Shoulder Diagonals)  Level 2 (Red)    Diagonals Limitations  diagonals single arm at a time in supine    Other Supine Exercises  belly breathing      Shoulder Exercises: Standing   Other Standing Exercises  biceps & triceps red tband      Shoulder Exercises: Stretch   Other Shoulder Stretches  supine pec stretch      Manual Therapy   Manual therapy comments  gentle cervial traction    Edema Management  using finger ladder, self massage to axilla     Soft tissue mobilization  Rt subscap from axilla                  PT Long Term Goals - 12/11/18 1450      PT LONG TERM GOAL #1   Title  Pt will be independent in her HEP and Progression.    Baseline  issued initial HEP on 12/11/2018    Time  6    Period  Weeks    Status  New    Target Date  01/22/19      PT LONG TERM GOAL #2   Title  Pt will be able to improve her cervical rotation to >/= 50 degrees bilaterally in order to improve functional mobility.    Baseline  R rotation: 30 degrees, L rotation: 35 degrees    Time  6    Period  Weeks    Status  New    Target Date  01/22/19      PT LONG TERM GOAL #3   Title  Pt will be able to improve hamstring flexiblity to >/= 80 degrees bilaterally to improve functional mobility.    Baseline  R: 60 degrees, L: 75 degrees    Time  6    Period  Weeks    Status  New    Target Date  01/22/19      PT LONG TERM GOAL #4   Title  pt will be able to amb community distances for grocery shopping >/= 30 minutes with pain </= 3/10 in her back.    Baseline  currently pt reporting pain can intermittently  increase to 7-8/10 in her back with everyday activities    Time  6    Period  Weeks    Status  New    Target Date  01/22/19      PT LONG TERM GOAL #5   Title  Pt will pick up a bag of groceries off the floor using her L UE with no back pain reported demonstrating correct lifting posture.    Baseline  pt reporting pain and difficutly reaching toward the floor    Time  6    Period  Weeks    Status  New    Target Date  01/22/19            Plan - 01/01/19 0846    Clinical Impression Statement  Reviewed HEP and added options to use tband rather than weights for a different challenge. Continued spasm in UE but is decreasing with strengthening. Reports feeling swollen in axilla and we discussed anatomy of the area and probably contribution of pressure from edema to spasm and tightness.    PT Treatment/Interventions  ADLs/Self Care  Home Management;Electrical Stimulation;Therapeutic exercise;Patient/family  education;Manual techniques;Manual lymph drainage;Passive range of motion;Taping;Therapeutic activities;Functional mobility training    PT Next Visit Plan  functional lifting    PT Home Exercise Plan  child pose, belly breathing, supine chest stretch, supine horiz abd, lat pull down motion, row, biceps/triceps band, D1 & D2    Consulted and Agree with Plan of Care  Patient       Patient will benefit from skilled therapeutic intervention in order to improve the following deficits and impairments:  Decreased activity tolerance, Decreased range of motion, Decreased strength, Pain, Difficulty walking, Postural dysfunction, Impaired flexibility  Visit Diagnosis: 1. Acute bilateral low back pain without sciatica   2. Cervicalgia   3. Muscle weakness (generalized)   4. Abnormal posture        Problem List Patient Active Problem List   Diagnosis Date Noted  . Port catheter in place 12/28/2015  . Chemotherapy induced neutropenia ([place]) 05/14/2015  . Chemotherapy-induced neuropathy ([place]) 04/08/2015  . 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 Tooth ache 01/09/2015  . Genetic testing 12/26/2014  . Family history of breast cancer   . Malignant neoplasm of lower-inner quadrant of right breast of female, estrogen receptor negative ([place]) 11/25/2014  . Migraine 11/19/2014    Petra Sargeant C. Kashay Cavenaugh PT, DPT 01/01/19 8:48 AM   [facility] [address] [place], [place], [address] Phone: [phone]   Fax:  [phone]  Name: CHAUNDRA ABREU MRN: [id number] Date of Birth: [birth year]

## 2019-01-03 ENCOUNTER — Ambulatory Visit: Payer: Medicare Other | Admitting: Physical Therapy

## 2019-01-03 ENCOUNTER — Encounter: Payer: Self-pay | Admitting: Physical Therapy

## 2019-01-03 ENCOUNTER — Other Ambulatory Visit: Payer: Self-pay

## 2019-01-03 DIAGNOSIS — M545 Low back pain, unspecified: Secondary | ICD-10-CM

## 2019-01-03 DIAGNOSIS — M6281 Muscle weakness (generalized): Secondary | ICD-10-CM | POA: Diagnosis not present

## 2019-01-03 DIAGNOSIS — M546 Pain in thoracic spine: Secondary | ICD-10-CM

## 2019-01-03 DIAGNOSIS — M542 Cervicalgia: Secondary | ICD-10-CM | POA: Diagnosis not present

## 2019-01-03 DIAGNOSIS — R293 Abnormal posture: Secondary | ICD-10-CM | POA: Diagnosis not present

## 2019-01-03 DIAGNOSIS — M25611 Stiffness of right shoulder, not elsewhere classified: Secondary | ICD-10-CM | POA: Diagnosis not present

## 2019-01-03 LAB — CYTOLOGY - PAP
Chlamydia: NEGATIVE
HPV 16/18/45 genotyping: NEGATIVE
HPV: DETECTED — AB
Neisseria Gonorrhea: NEGATIVE

## 2019-01-03 NOTE — Therapy (Signed)
[place], [place], [address] Phone: [phone]   Fax:  [phone]  Physical Therapy Treatment  Patient Details  Name: Susan Wilson MRN: [id number] Date of Birth: 09-Oct-1975 Referring Provider (PT): Lurline Del, MD   Encounter Date: 01/03/2019  PT End of Session - 01/03/19 0806    Visit Number  7    Number of Visits  12    Date for PT Re-Evaluation  02/11/19    Authorization Type  Medicare    PT Start Time  0803    PT Stop Time  0850    PT Time Calculation (min)  47 min    Activity Tolerance  Patient tolerated treatment well    Behavior During Therapy  [facility] for tasks assessed/performed       Past Medical History:  Diagnosis Date  . Anemia   . Breast asymmetry 12/2015  . Breast cancer ([place]) 2017   right breast  . History of breast cancer 11/2014   right  . History of chemotherapy   . Migraines   . 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 Personal history of chemotherapy 2017  . Personal history of radiation therapy 2017    Past Surgical History:  Procedure Laterality Date  . BREAST LUMPECTOMY Right 2017  . BREAST RECONSTRUCTION Right 06/23/2015   Procedure: ONCOPLASTIC RIGHT BREAST REDUCTION;  Surgeon: Irene Limbo, MD;  Location: [place];  Service: Plastics;  Laterality: Right;  . BREAST REDUCTION SURGERY Left 06/23/2015   Procedure: MAMMARY REDUCTION  (BREAST) LEFT BREAST FOR SYMETRY (BILATERAL BREAST REDUCTION);  Surgeon: Irene Limbo, MD;  Location: [place];  Service: Plastics;  Laterality: Left;  . BREAST REDUCTION SURGERY Left 01/15/2016   Procedure: MAMMARY REDUCTION  (BREAST) LEFT;  Surgeon: Irene Limbo, MD;  Location: [place];  Service: Plastics;  Laterality: Left;  Marland Kitchen MASTOPEXY Left 01/15/2016   Procedure: POSSIBLE LEFT MASTOPEXY;  Surgeon: Irene Limbo, MD;  Location: [place];  Service: Plastics;  Laterality: Left;  . PORT-A-CATH REMOVAL Right 01/15/2016   Procedure: REMOVAL OF RIGHT PORT-A-CATH;  Surgeon: Irene Limbo, MD;  Location: [place];  Service: Plastics;  Laterality: Right;  . PORTACATH PLACEMENT Right 12/16/2014   Procedure: INSERTION PORT-A-CATH WITH ULTRASOUND;  Surgeon: Rolm Bookbinder, MD;  Location: [place];  Service: General;  Laterality: Right;  . RADIOACTIVE SEED GUIDED PARTIAL MASTECTOMY WITH AXILLARY SENTINEL LYMPH NODE BIOPSY Right 06/15/2015   Procedure: RADIOACTIVE SEED GUIDED PARTIAL MASTECTOMY WITH AXILLARY SENTINEL LYMPH NODE BIOPSY;  Surgeon: Rolm Bookbinder, MD;  Location: [place];  Service: General;  Laterality: Right;  . REDUCTION MAMMAPLASTY Bilateral 2017    There were no vitals filed for this visit.  Subjective Assessment - 01/03/19 0806    Subjective  Just a little stiff and achey this morning but feeling okay. Spasms are not as bad, better with doing exercises.    Patient Stated Goals  Be able to walk and do everyday activities without pain    Currently in Pain?  No/denies                       [facility] Adult PT Treatment/Exercise - 01/03/19 0001      Therapeutic Activites    Therapeutic Activities  Lifting    Lifting  floor to counter, counter to overhead      Shoulder Exercises: ROM/Strengthening  UBE (Upper Arm Bike)  3'/3' L2      Shoulder Exercises: Stretch   Table Stretch -Flexion Limitations  child pose    Other Shoulder Stretches  supine pec stretch    Other Shoulder Stretches  open book      Modalities   Modalities  Cryotherapy      Cryotherapy   Number Minutes Cryotherapy  10 Minutes   2 min concurrent with education   Cryotherapy Location  Shoulder   Rt   Type of Cryotherapy  Ice pack                  PT Long Term Goals - 12/11/18 1450      PT LONG TERM GOAL #1   Title  Pt will be independent in her HEP and Progression.     Baseline  issued initial HEP on 12/11/2018    Time  6    Period  Weeks    Status  New    Target Date  01/22/19      PT LONG TERM GOAL #2   Title  Pt will be able to improve her cervical rotation to >/= 50 degrees bilaterally in order to improve functional mobility.    Baseline  R rotation: 30 degrees, L rotation: 35 degrees    Time  6    Period  Weeks    Status  New    Target Date  01/22/19      PT LONG TERM GOAL #3   Title  Pt will be able to improve hamstring flexiblity to >/= 80 degrees bilaterally to improve functional mobility.    Baseline  R: 60 degrees, L: 75 degrees    Time  6    Period  Weeks    Status  New    Target Date  01/22/19      PT LONG TERM GOAL #4   Title  pt will be able to amb community distances for grocery shopping >/= 30 minutes with pain </= 3/10 in her back.    Baseline  currently pt reporting pain can intermittently  increase to 7-8/10 in her back with everyday activities    Time  6    Period  Weeks    Status  New    Target Date  01/22/19      PT LONG TERM GOAL #5   Title  Pt will pick up a bag of groceries off the floor using her L UE with no back pain reported demonstrating correct lifting posture.    Baseline  pt reporting pain and difficutly reaching toward the floor    Time  6    Period  Weeks    Status  New    Target Date  01/22/19            Plan - 01/03/19 0931    Clinical Impression Statement  Discussed functional lifting today and practiced techniques to decrease upper trap use and improve balance. Pt reported feeling fatigued but did not have any spasm. Recommended getting a laundry basket that is not as wide and consider moving pots/pans down lower in kitchen. Will alter POC to 1/week as she is progressing vey well.    PT Treatment/Interventions  ADLs/Self Care Home Management;Electrical Stimulation;Therapeutic exercise;Patient/family education;Manual techniques;Manual lymph drainage;Passive range of motion;Taping;Therapeutic  activities;Functional mobility training    PT Next Visit Plan  how did lifting go at home? decrease to 1/week    PT Home Exercise Plan  child pose, belly breathing,  supine chest stretch, supine horiz abd, lat pull down motion, row, biceps/triceps band, D1 & D2    Consulted and Agree with Plan of Care  Patient       Patient will benefit from skilled therapeutic intervention in order to improve the following deficits and impairments:  Decreased activity tolerance, Decreased range of motion, Decreased strength, Pain, Difficulty walking, Postural dysfunction, Impaired flexibility  Visit Diagnosis: 1. Acute bilateral low back pain without sciatica   2. Cervicalgia   3. Muscle weakness (generalized)   4. Abnormal posture   5. Pain in thoracic spine        Problem List Patient Active Problem List   Diagnosis Date Noted  . Port catheter in place 12/28/2015  . Chemotherapy induced neutropenia ([place]) 05/14/2015  . Chemotherapy-induced neuropathy ([place]) 04/08/2015  . 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 Tooth ache 01/09/2015  . Genetic testing 12/26/2014  . Family history of breast cancer   . Malignant neoplasm of lower-inner quadrant of right breast of female, estrogen receptor negative ([place]) 11/25/2014  . Migraine 11/19/2014    Janett Billow C. Mohamad Bruso PT, DPT 01/03/19 9:35 AM   [name] [address] [place], [place], [address] Phone: [phone]   Fax:  [phone]  Name: Susan Wilson MRN: [id number] Date of Birth: 10-Sep-1975

## 2019-01-04 ENCOUNTER — Telehealth: Payer: Self-pay

## 2019-01-04 NOTE — Telephone Encounter (Addendum)
-----   Message from Mora Bellman, MD sent at 01/03/2019  6:17 PM EDT ----- Patient with abnormal pap smear. Please inform patient and schedule colposcopy  LM for pt that I am calling with results and f/u appt if she could please give the office a call back.

## 2019-01-07 ENCOUNTER — Telehealth: Payer: Self-pay | Admitting: *Deleted

## 2019-01-07 NOTE — Telephone Encounter (Signed)
Called pt back to discuss results of Pap. Pt did not answer. Informed her I sent her a My Chart message if she has a chance to check it. She can call the office back at her convenience.

## 2019-01-07 NOTE — Telephone Encounter (Signed)
Pt left message stating that she missed a call from our office regarding test results. She would like a call back.

## 2019-01-07 NOTE — Telephone Encounter (Signed)
Attempted to call pt to inform her of PAP results and follow up appt for [place]. LM for pt to call the office at her convenience. Will send MyChart Message to patient.

## 2019-01-08 ENCOUNTER — Ambulatory Visit: Payer: Medicare Other | Admitting: Physical Therapy

## 2019-01-08 ENCOUNTER — Encounter: Payer: Self-pay | Admitting: Physical Therapy

## 2019-01-08 ENCOUNTER — Other Ambulatory Visit: Payer: Self-pay

## 2019-01-08 DIAGNOSIS — M545 Low back pain, unspecified: Secondary | ICD-10-CM

## 2019-01-08 DIAGNOSIS — M6281 Muscle weakness (generalized): Secondary | ICD-10-CM

## 2019-01-08 DIAGNOSIS — M546 Pain in thoracic spine: Secondary | ICD-10-CM

## 2019-01-08 DIAGNOSIS — M542 Cervicalgia: Secondary | ICD-10-CM

## 2019-01-08 DIAGNOSIS — R293 Abnormal posture: Secondary | ICD-10-CM | POA: Diagnosis not present

## 2019-01-08 DIAGNOSIS — M25611 Stiffness of right shoulder, not elsewhere classified: Secondary | ICD-10-CM

## 2019-01-08 DIAGNOSIS — M25511 Pain in right shoulder: Secondary | ICD-10-CM

## 2019-01-08 NOTE — Therapy (Signed)
[place], [place], [address] Phone: [phone]   Fax:  [phone]  Physical Therapy Treatment  Patient Details  Name: Susan Wilson MRN: [id number] Date of Birth: 1975-09-23 Referring Provider (PT): Lurline Del, MD   Encounter Date: 01/08/2019  PT End of Session - 01/08/19 0841    Visit Number  8    Number of Visits  12    Date for PT Re-Evaluation  02/11/19    Authorization Type  Medicare    PT Start Time  0803    PT Stop Time  0842    PT Time Calculation (min)  39 min       Past Medical History:  Diagnosis Date  . Anemia   . Breast asymmetry 12/2015  . Breast cancer ([place]) 2017   right breast  . History of breast cancer 11/2014   right  . History of chemotherapy   . Migraines   . 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 Personal history of chemotherapy 2017  . Personal history of radiation therapy 2017    Past Surgical History:  Procedure Laterality Date  . BREAST LUMPECTOMY Right 2017  . BREAST RECONSTRUCTION Right 06/23/2015   Procedure: ONCOPLASTIC RIGHT BREAST REDUCTION;  Surgeon: Irene Limbo, MD;  Location: [place];  Service: Plastics;  Laterality: Right;  . BREAST REDUCTION SURGERY Left 06/23/2015   Procedure: MAMMARY REDUCTION  (BREAST) LEFT BREAST FOR SYMETRY (BILATERAL BREAST REDUCTION);  Surgeon: Irene Limbo, MD;  Location: [place];  Service: Plastics;  Laterality: Left;  . BREAST REDUCTION SURGERY Left 01/15/2016   Procedure: MAMMARY REDUCTION  (BREAST) LEFT;  Surgeon: Irene Limbo, MD;  Location: [place];  Service: Plastics;  Laterality: Left;  Marland Kitchen MASTOPEXY Left 01/15/2016   Procedure: POSSIBLE LEFT MASTOPEXY;  Surgeon: Irene Limbo, MD;  Location: [place];  Service: Plastics;  Laterality: Left;  . PORT-A-CATH REMOVAL Right 01/15/2016   Procedure: REMOVAL OF  RIGHT PORT-A-CATH;  Surgeon: Irene Limbo, MD;  Location: [place];  Service: Plastics;  Laterality: Right;  . PORTACATH PLACEMENT Right 12/16/2014   Procedure: INSERTION PORT-A-CATH WITH ULTRASOUND;  Surgeon: Rolm Bookbinder, MD;  Location: [place];  Service: General;  Laterality: Right;  . RADIOACTIVE SEED GUIDED PARTIAL MASTECTOMY WITH AXILLARY SENTINEL LYMPH NODE BIOPSY Right 06/15/2015   Procedure: RADIOACTIVE SEED GUIDED PARTIAL MASTECTOMY WITH AXILLARY SENTINEL LYMPH NODE BIOPSY;  Surgeon: Rolm Bookbinder, MD;  Location: [place];  Service: General;  Laterality: Right;  . REDUCTION MAMMAPLASTY Bilateral 2017    There were no vitals filed for this visit.  Subjective Assessment - 01/08/19 0805    Subjective  A little achey from the weather.    Currently in Pain?  Yes    Pain Score  3     Pain Location  Shoulder    Pain Orientation  Right    Pain Descriptors / Indicators  Aching    Pain Radiating Towards  into right hand    Aggravating Factors   weather    Pain Relieving Factors  rest                       [facility] Adult PT Treatment/Exercise - 01/08/19 0001      Lumbar Exercises: Stretches   Active Hamstring Stretch Limitations  2 x 20  bilat       Shoulder Exercises: Supine  Horizontal ABduction  15 reps    Theraband Level (Shoulder Horizontal ABduction)  Level 3 (Green)    External Rotation  20 reps    Theraband Level (Shoulder External Rotation)  Level 3 (Green)    Theraband Level (Shoulder Diagonals)  Level 3 (Green)    Diagonals Limitations  diagonals single arm at a time in supine, red band    Other Supine Exercises  lat pull red band x 10 -manual resist from PTA    Other Supine Exercises  chest stretch per HEP      Shoulder Exercises: Seated   Other Seated Exercises  Scap squeeze x 10       Shoulder Exercises: ROM/Strengthening   UBE (Upper Arm Bike)  3'/3' L2      Shoulder Exercises: Stretch   Corner Stretch   3 reps;20 seconds    Other Shoulder Stretches  upper trap  stretch     Other Shoulder Stretches  open book                  PT Long Term Goals - 12/11/18 1450      PT LONG TERM GOAL #1   Title  Pt will be independent in her HEP and Progression.    Baseline  issued initial HEP on 12/11/2018    Time  6    Period  Weeks    Status  New    Target Date  01/22/19      PT LONG TERM GOAL #2   Title  Pt will be able to improve her cervical rotation to >/= 50 degrees bilaterally in order to improve functional mobility.    Baseline  R rotation: 30 degrees, L rotation: 35 degrees    Time  6    Period  Weeks    Status  New    Target Date  01/22/19      PT LONG TERM GOAL #3   Title  Pt will be able to improve hamstring flexiblity to >/= 80 degrees bilaterally to improve functional mobility.    Baseline  R: 60 degrees, L: 75 degrees    Time  6    Period  Weeks    Status  New    Target Date  01/22/19      PT LONG TERM GOAL #4   Title  pt will be able to amb community distances for grocery shopping >/= 30 minutes with pain </= 3/10 in her back.    Baseline  currently pt reporting pain can intermittently  increase to 7-8/10 in her back with everyday activities    Time  6    Period  Weeks    Status  New    Target Date  01/22/19      PT LONG TERM GOAL #5   Title  Pt will pick up a bag of groceries off the floor using her L UE with no back pain reported demonstrating correct lifting posture.    Baseline  pt reporting pain and difficutly reaching toward the floor    Time  6    Period  Weeks    Status  New    Target Date  01/22/19            Plan - 01/08/19 0815    Clinical Impression Statement  Increased spasms for rest of day after lifting last session. Today pain localized in right shoulder and arm. Worked on scapular and shoulder strength. Some achiness in hands reported with gripping bands. Afterward  pt reports some right shoulder tightness. Encouraged her to stretch at  home.    PT Next Visit Plan  how did lifting go at home? decrease to 1/week    PT Home Exercise Plan  child pose, belly breathing, supine chest stretch, supine horiz abd, lat pull down motion, row, biceps/triceps band, D1 & D2       Patient will benefit from skilled therapeutic intervention in order to improve the following deficits and impairments:  Decreased activity tolerance, Decreased range of motion, Decreased strength, Pain, Difficulty walking, Postural dysfunction, Impaired flexibility  Visit Diagnosis: Acute bilateral low back pain without sciatica  Cervicalgia  Muscle weakness (generalized)  Abnormal posture  Pain in thoracic spine  Stiffness of right shoulder, not elsewhere classified  Right shoulder pain, unspecified chronicity     Problem List Patient Active Problem List   Diagnosis Date Noted  . Port catheter in place 12/28/2015  . Chemotherapy induced neutropenia ([place]) 05/14/2015  . Chemotherapy-induced neuropathy ([place]) 04/08/2015  . 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 Tooth ache 01/09/2015  . Genetic testing 12/26/2014  . Family history of breast cancer   . Malignant neoplasm of lower-inner quadrant of right breast of female, estrogen receptor negative ([place]) 11/25/2014  . Migraine 11/19/2014    Dorene Ar, PTA 01/08/2019, 8:46 AM  [facility] [address] [place], [place], [address] Phone: [phone]   Fax:  [phone]  Name: Susan Wilson MRN: [id number] Date of Birth: 03-03-1976

## 2019-01-10 ENCOUNTER — Ambulatory Visit: Payer: Medicare Other | Admitting: Physical Therapy

## 2019-01-15 ENCOUNTER — Ambulatory Visit: Payer: Medicare Other | Admitting: Physical Therapy

## 2019-01-17 ENCOUNTER — Ambulatory Visit: Payer: Medicare Other | Admitting: Physical Therapy

## 2019-01-22 ENCOUNTER — Ambulatory Visit: Payer: Medicare Other | Attending: Oncology | Admitting: Physical Therapy

## 2019-01-22 ENCOUNTER — Encounter: Payer: Self-pay | Admitting: Physical Therapy

## 2019-01-22 ENCOUNTER — Other Ambulatory Visit: Payer: Self-pay

## 2019-01-22 DIAGNOSIS — M546 Pain in thoracic spine: Secondary | ICD-10-CM | POA: Diagnosis not present

## 2019-01-22 DIAGNOSIS — R293 Abnormal posture: Secondary | ICD-10-CM | POA: Insufficient documentation

## 2019-01-22 DIAGNOSIS — M545 Low back pain, unspecified: Secondary | ICD-10-CM

## 2019-01-22 DIAGNOSIS — M6281 Muscle weakness (generalized): Secondary | ICD-10-CM | POA: Diagnosis not present

## 2019-01-22 DIAGNOSIS — M25511 Pain in right shoulder: Secondary | ICD-10-CM | POA: Insufficient documentation

## 2019-01-22 DIAGNOSIS — M542 Cervicalgia: Secondary | ICD-10-CM | POA: Insufficient documentation

## 2019-01-22 DIAGNOSIS — M25611 Stiffness of right shoulder, not elsewhere classified: Secondary | ICD-10-CM | POA: Insufficient documentation

## 2019-01-22 NOTE — Therapy (Signed)
[place], [place], [address] Phone: [phone]   Fax:  [phone]  Physical Therapy Treatment  Patient Details  Name: Susan Wilson MRN: [id number] Date of Birth: 25-May-1975 Referring Provider (PT): Lurline Del, MD   Encounter Date: 01/22/2019  PT End of Session - 01/22/19 0808    Visit Number  9    Number of Visits  12    Date for PT Re-Evaluation  02/11/19    Authorization Type  Medicare    PT Start Time  0804    PT Stop Time  0923    PT Time Calculation (min)  51 min    Activity Tolerance  Patient tolerated treatment well    Behavior During Therapy  [facility] for tasks assessed/performed       Past Medical History:  Diagnosis Date  . Anemia   . Breast asymmetry 12/2015  . Breast cancer ([place]) 2017   right breast  . History of breast cancer 11/2014   right  . History of chemotherapy   . Migraines   . 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 Personal history of chemotherapy 2017  . Personal history of radiation therapy 2017    Past Surgical History:  Procedure Laterality Date  . BREAST LUMPECTOMY Right 2017  . BREAST RECONSTRUCTION Right 06/23/2015   Procedure: ONCOPLASTIC RIGHT BREAST REDUCTION;  Surgeon: Irene Limbo, MD;  Location: [place];  Service: Plastics;  Laterality: Right;  . BREAST REDUCTION SURGERY Left 06/23/2015   Procedure: MAMMARY REDUCTION  (BREAST) LEFT BREAST FOR SYMETRY (BILATERAL BREAST REDUCTION);  Surgeon: Irene Limbo, MD;  Location: [place];  Service: Plastics;  Laterality: Left;  . BREAST REDUCTION SURGERY Left 01/15/2016   Procedure: MAMMARY REDUCTION  (BREAST) LEFT;  Surgeon: Irene Limbo, MD;  Location: [place];  Service: Plastics;  Laterality: Left;  Marland Kitchen MASTOPEXY Left 01/15/2016   Procedure: POSSIBLE LEFT MASTOPEXY;  Surgeon: Irene Limbo, MD;  Location: [place];  Service: Plastics;  Laterality: Left;  . PORT-A-CATH REMOVAL Right 01/15/2016   Procedure: REMOVAL OF RIGHT PORT-A-CATH;  Surgeon: Irene Limbo, MD;  Location: [place];  Service: Plastics;  Laterality: Right;  . PORTACATH PLACEMENT Right 12/16/2014   Procedure: INSERTION PORT-A-CATH WITH ULTRASOUND;  Surgeon: Rolm Bookbinder, MD;  Location: [place];  Service: General;  Laterality: Right;  . RADIOACTIVE SEED GUIDED PARTIAL MASTECTOMY WITH AXILLARY SENTINEL LYMPH NODE BIOPSY Right 06/15/2015   Procedure: RADIOACTIVE SEED GUIDED PARTIAL MASTECTOMY WITH AXILLARY SENTINEL LYMPH NODE BIOPSY;  Surgeon: Rolm Bookbinder, MD;  Location: [place];  Service: General;  Laterality: Right;  . REDUCTION MAMMAPLASTY Bilateral 2017    There were no vitals filed for this visit.  Subjective Assessment - 01/22/19 0810    Subjective  Spasm came on a couple of hours after session when practicing lifting- likely because nerve medication wore off. holding arms overhead to fix hair, reaching overhead still cause a good bit of spasm. got a new laundry basket because the wide lifting was causing spasm.    Currently in Pain?  Yes    Pain Score  3     Pain Location  Back    Pain Descriptors / Indicators  Aching         [facility] PT Assessment - 01/22/19 0001      Strength   Right Shoulder Flexion  5/5    Right  Shoulder Extension  5/5    Right Shoulder ABduction  5/5    Right Shoulder Internal Rotation  5/5    Right Shoulder External Rotation  4+/5    Right Shoulder Horizontal ABduction  5/5    Left Shoulder Flexion  5/5    Left Shoulder Extension  5/5    Left Shoulder ABduction  5/5    Left Shoulder Internal Rotation  5/5    Left Shoulder External Rotation  5/5    Left Shoulder Horizontal ABduction  5/5                   [facility] Adult PT Treatment/Exercise - 01/22/19 0001      Lumbar Exercises: Stretches   Other Lumbar Stretch Exercise  long body  stretch      Lumbar Exercises: Aerobic   Nustep  6 min L4 UE & LE      Shoulder Exercises: Supine   Shoulder Flexion Weight (lbs)  1    Flexion Limitations  punch-reach OH- to ceiling, lower    Diagonals Limitations  diagonals- single arm, yellow tband    Other Supine Exercises  bil UE flx with pull out on yellow tband      Shoulder Exercises: Seated   Internal Rotation Weight (lbs)  1    Internal Rotation Limitations  arms OH with IR/ER rotations    Other Seated Exercises  long sitting against wall OH press 1lb, x3 sets to fatigue (max 10)      Shoulder Exercises: Stretch   Table Stretch -Flexion Limitations  standing child pose    Other Shoulder Stretches  upper trap  stretch     Other Shoulder Stretches  belly breathing      Cryotherapy   Number Minutes Cryotherapy  10 Minutes    Cryotherapy Location  Shoulder    Type of Cryotherapy  Ice pack                  PT Long Term Goals - 12/11/18 1450      PT LONG TERM GOAL #1   Title  Pt will be independent in her HEP and Progression.    Baseline  issued initial HEP on 12/11/2018    Time  6    Period  Weeks    Status  New    Target Date  01/22/19      PT LONG TERM GOAL #2   Title  Pt will be able to improve her cervical rotation to >/= 50 degrees bilaterally in order to improve functional mobility.    Baseline  R rotation: 30 degrees, L rotation: 35 degrees    Time  6    Period  Weeks    Status  New    Target Date  01/22/19      PT LONG TERM GOAL #3   Title  Pt will be able to improve hamstring flexiblity to >/= 80 degrees bilaterally to improve functional mobility.    Baseline  R: 60 degrees, L: 75 degrees    Time  6    Period  Weeks    Status  New    Target Date  01/22/19      PT LONG TERM GOAL #4   Title  pt will be able to amb community distances for grocery shopping >/= 30 minutes with pain </= 3/10 in her back.    Baseline  currently pt reporting pain can intermittently  increase to 7-8/10 in her  back with everyday  activities    Time  6    Period  Weeks    Status  New    Target Date  01/22/19      PT LONG TERM GOAL #5   Title  Pt will pick up a bag of groceries off the floor using her L UE with no back pain reported demonstrating correct lifting posture.    Baseline  pt reporting pain and difficutly reaching toward the floor    Time  6    Period  Weeks    Status  New    Target Date  01/22/19            Plan - 01/22/19 0836    Clinical Impression Statement  Focusing on low weight with endurance as she has progressed in MMT strength but still has spasm with ADLs such as overhead reaching and drying/fixing her hair.    PT Treatment/Interventions  ADLs/Self Care Home Management;Electrical Stimulation;Therapeutic exercise;Patient/family education;Manual techniques;Manual lymph drainage;Passive range of motion;Taping;Therapeutic activities;Functional mobility training    PT Next Visit Plan  how did endurance do with spasm? continue functional strengthening    PT Home Exercise Plan  child pose, belly breathing, supine chest stretch, supine horiz abd, lat pull down motion, row, biceps/triceps band, D1 & D2, OH press back against wall    Consulted and Agree with Plan of Care  Patient       Patient will benefit from skilled therapeutic intervention in order to improve the following deficits and impairments:  Decreased activity tolerance, Decreased range of motion, Decreased strength, Pain, Difficulty walking, Postural dysfunction, Impaired flexibility  Visit Diagnosis: Acute bilateral low back pain without sciatica  Cervicalgia  Muscle weakness (generalized)  Abnormal posture  Pain in thoracic spine     Problem List Patient Active Problem List   Diagnosis Date Noted  . Port catheter in place 12/28/2015  . Chemotherapy induced neutropenia ([place]) 05/14/2015  . Chemotherapy-induced neuropathy ([place]) 04/08/2015  . 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 Tooth ache  01/09/2015  . Genetic testing 12/26/2014  . Family history of breast cancer   . Malignant neoplasm of lower-inner quadrant of right breast of female, estrogen receptor negative ([place]) 11/25/2014  . Migraine 11/19/2014   Wilberto Console C. Mashell Sieben PT, DPT 01/22/19 5:09 PM   [facility] [address] [place], [place], [address] Phone: [phone]   Fax:  [phone]  Name: Susan Wilson MRN: [id number] Date of Birth: 12/22/1975

## 2019-01-24 ENCOUNTER — Ambulatory Visit: Payer: Medicare Other | Admitting: Physical Therapy

## 2019-01-28 ENCOUNTER — Ambulatory Visit: Payer: Medicare Other | Admitting: Physical Therapy

## 2019-01-28 ENCOUNTER — Other Ambulatory Visit: Payer: Self-pay

## 2019-01-28 ENCOUNTER — Encounter: Payer: Self-pay | Admitting: Physical Therapy

## 2019-01-28 DIAGNOSIS — M542 Cervicalgia: Secondary | ICD-10-CM

## 2019-01-28 DIAGNOSIS — M6281 Muscle weakness (generalized): Secondary | ICD-10-CM

## 2019-01-28 DIAGNOSIS — R293 Abnormal posture: Secondary | ICD-10-CM | POA: Diagnosis not present

## 2019-01-28 DIAGNOSIS — M25611 Stiffness of right shoulder, not elsewhere classified: Secondary | ICD-10-CM | POA: Diagnosis not present

## 2019-01-28 DIAGNOSIS — M545 Low back pain, unspecified: Secondary | ICD-10-CM

## 2019-01-28 DIAGNOSIS — M546 Pain in thoracic spine: Secondary | ICD-10-CM

## 2019-01-28 NOTE — Therapy (Signed)
[place], [place], [address] Phone: [phone]   Fax:  [phone]  Physical Therapy Treatment Progress Note Reporting Period 12/11/2018 to 01/28/2019  See note below for Objective Data and Assessment of Progress/Goals.       Patient Details  Name: Susan Wilson MRN: [id number] Date of Birth: [birth year] Referring Provider (PT): Magrinat, Sarajane Jews, MD   Encounter Date: 01/28/2019  PT End of Session - 01/28/19 0942    Visit Number  10    Number of Visits  12    Date for PT Re-Evaluation  02/11/19    Authorization Type  Medicare    PT Start Time  [phone]   pt arrived late   PT Stop Time  1025    PT Time Calculation (min)  49 min    Activity Tolerance  Patient tolerated treatment well    Behavior During Therapy  [facility] for tasks assessed/performed       Past Medical History:  Diagnosis Date  . Anemia   . Breast asymmetry 12/2015  . Breast cancer ([place]) 2017   right breast  . History of breast cancer 11/2014   right  . History of chemotherapy   . Migraines   . 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 Personal history of chemotherapy 2017  . Personal history of radiation therapy 2017    Past Surgical History:  Procedure Laterality Date  . BREAST LUMPECTOMY Right 2017  . BREAST RECONSTRUCTION Right 06/23/2015   Procedure: ONCOPLASTIC RIGHT BREAST REDUCTION;  Surgeon: Irene Limbo, MD;  Location: [place];  Service: Plastics;  Laterality: Right;  . BREAST REDUCTION SURGERY Left 06/23/2015   Procedure: MAMMARY REDUCTION  (BREAST) LEFT BREAST FOR SYMETRY (BILATERAL BREAST REDUCTION);  Surgeon: Irene Limbo, MD;  Location: [place];  Service: Plastics;  Laterality: Left;  . BREAST REDUCTION SURGERY Left 01/15/2016   Procedure: MAMMARY REDUCTION  (BREAST) LEFT;  Surgeon: Irene Limbo, MD;  Location: [place];   Service: Plastics;  Laterality: Left;  Marland Kitchen MASTOPEXY Left 01/15/2016   Procedure: POSSIBLE LEFT MASTOPEXY;  Surgeon: Irene Limbo, MD;  Location: [place];  Service: Plastics;  Laterality: Left;  . PORT-A-CATH REMOVAL Right 01/15/2016   Procedure: REMOVAL OF RIGHT PORT-A-CATH;  Surgeon: Irene Limbo, MD;  Location: [place];  Service: Plastics;  Laterality: Right;  . PORTACATH PLACEMENT Right 12/16/2014   Procedure: INSERTION PORT-A-CATH WITH ULTRASOUND;  Surgeon: Rolm Bookbinder, MD;  Location: [place];  Service: General;  Laterality: Right;  . RADIOACTIVE SEED GUIDED PARTIAL MASTECTOMY WITH AXILLARY SENTINEL LYMPH NODE BIOPSY Right 06/15/2015   Procedure: RADIOACTIVE SEED GUIDED PARTIAL MASTECTOMY WITH AXILLARY SENTINEL LYMPH NODE BIOPSY;  Surgeon: Rolm Bookbinder, MD;  Location: [place];  Service: General;  Laterality: Right;  . REDUCTION MAMMAPLASTY Bilateral 2017    There were no vitals filed for this visit.  Subjective Assessment - 01/28/19 0938    Subjective  Spasms started about an hour and a half after the appointment, did my compression machine and took my meds which helped. Spasms got better but Thursday, when the weather got bad, I got realy swollen again. May need to go back to using Ktape for edema between uses of machine.    Patient Stated Goals  Be able to walk and do everyday activities without pain    Currently in Pain?  Yes    Pain Score  3     Pain Location  Back    Pain Orientation  Upper;Mid    Pain Descriptors / Indicators  Aching    Aggravating Factors   weather, lifting    Pain Relieving Factors  compression machine         [facility] PT Assessment - 01/28/19 0001      Assessment   Medical Diagnosis  lumbar, thoracic, and cervical back pain    Referring Provider (PT)  Magrinat, Sarajane Jews, MD    Onset Date/Surgical Date  10/29/18    Hand Dominance  Right    Prior Therapy  yes, cancer rehab for lymphadema                    [facility] Adult PT Treatment/Exercise - 01/28/19 0001      Lumbar Exercises: Aerobic   Nustep  5 min L5 UE & LE      Lumbar Exercises: Supine   Bent Knee Raise Limitations  alternate marching- cues for core    Other Supine Lumbar Exercises  TT holds 6x5s      Shoulder Exercises: Supine   Diagonals Limitations  D1 & D2 bil UE moving together    Other Supine Exercises  bil UE flx squeezing ball      Shoulder Exercises: Standing   Other Standing Exercises  ball on wall circles      Shoulder Exercises: Power Tower   Row Limitations  W row from [employer] 7lb each hand    Other Power Tower Exercises  bil UE press down 7lb    Other Power [employer] Exercises  single arm press down in scaption 7lb      Cryotherapy   Number Minutes Cryotherapy  10 Minutes    Cryotherapy Location  Shoulder    Type of Cryotherapy  Ice pack      Manual Therapy   Soft tissue mobilization  suboccipitals, Rt pec minor with UE motion for stretching                  PT Long Term Goals - 01/28/19 0959      PT LONG TERM GOAL #1   Title  Pt will be independent in her HEP and Progression.    Baseline  will finalize HEP in last 2 visits    Status  On-going      PT LONG TERM GOAL #2   Title  Pt will be able to improve her cervical rotation to >/= 50 degrees bilaterally in order to improve functional mobility.    Baseline  Rt 80 Lt 78    Status  Achieved      PT LONG TERM GOAL #3   Title  Pt will be able to improve hamstring flexiblity to >/= 80 degrees bilaterally to improve functional mobility.    Status  Achieved      PT LONG TERM GOAL #4   Title  pt will be able to amb community distances for grocery shopping >/= 30 minutes with pain </= 3/10 in her back.    Baseline  can be pretty comfortable for 30 min and then ache begins    Status  Achieved      PT LONG TERM GOAL #5   Title  Pt will pick up a bag of groceries off the floor using her L UE with no back pain reported  demonstrating correct lifting posture.    Baseline  limited in endurance, can reach to low cabinets but  not to  toes on  a bad day.    Status  Partially Met            Plan - 01/28/19 1351    Clinical Impression Statement  Continued light weight exercises today and advised which to do at home, asked that she break up the exercises to shorter rounds in the day rather than all at once and to end with supine diagonals in attempt to help with UE swelling.    PT Treatment/Interventions  ADLs/Self Care Home Management;Electrical Stimulation;Therapeutic exercise;Patient/family education;Manual techniques;Manual lymph drainage;Passive range of motion;Taping;Therapeutic activities;Functional mobility training    PT Next Visit Plan  endurance- light weight, esp overhead    PT Home Exercise Plan  child pose, belly breathing, supine chest stretch, supine horiz abd, lat pull down motion, row, biceps/triceps band, D1 & D2, OH press back against wall       Patient will benefit from skilled therapeutic intervention in order to improve the following deficits and impairments:  Decreased activity tolerance, Decreased range of motion, Decreased strength, Pain, Difficulty walking, Postural dysfunction, Impaired flexibility  Visit Diagnosis: Acute bilateral low back pain without sciatica  Cervicalgia  Muscle weakness (generalized)  Abnormal posture  Pain in thoracic spine     Problem List Patient Active Problem List   Diagnosis Date Noted  . Port catheter in place 12/28/2015  . Chemotherapy induced neutropenia ([place]) 05/14/2015  . 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 Tooth ache 01/09/2015  . Genetic testing 12/26/2014  . Family history of breast cancer   . Malignant neoplasm of lower-inner quadrant of right breast of female, estrogen receptor negative ([place]) 11/25/2014  . Migraine 11/19/2014    Kindred Heying C. Jennifermarie Franzen PT, DPT 01/28/19  1:53 PM   [facility] [address] [place], [place], [address] Phone: [phone]   Fax:  [phone]  Name: Susan Wilson MRN: [id number] Date of Birth: 1975/12/17

## 2019-02-05 ENCOUNTER — Ambulatory Visit: Payer: Medicare Other | Admitting: Physical Therapy

## 2019-02-05 ENCOUNTER — Encounter: Payer: Self-pay | Admitting: Physical Therapy

## 2019-02-05 ENCOUNTER — Other Ambulatory Visit: Payer: Self-pay

## 2019-02-05 DIAGNOSIS — M546 Pain in thoracic spine: Secondary | ICD-10-CM

## 2019-02-05 DIAGNOSIS — M545 Low back pain, unspecified: Secondary | ICD-10-CM

## 2019-02-05 DIAGNOSIS — M25611 Stiffness of right shoulder, not elsewhere classified: Secondary | ICD-10-CM | POA: Diagnosis not present

## 2019-02-05 DIAGNOSIS — M6281 Muscle weakness (generalized): Secondary | ICD-10-CM

## 2019-02-05 DIAGNOSIS — M25511 Pain in right shoulder: Secondary | ICD-10-CM

## 2019-02-05 DIAGNOSIS — M542 Cervicalgia: Secondary | ICD-10-CM | POA: Diagnosis not present

## 2019-02-05 DIAGNOSIS — R293 Abnormal posture: Secondary | ICD-10-CM

## 2019-02-05 NOTE — Therapy (Signed)
[place], [place], [address] Phone: [phone]   Fax:  [phone]  Physical Therapy Treatment  Patient Details  Name: Susan Wilson MRN: [id number] Date of Birth: 1976/05/13 Referring Provider (PT): Lurline Del, MD   Encounter Date: 02/05/2019  PT End of Session - 02/05/19 0850    Visit Number  11    Number of Visits  12    Date for PT Re-Evaluation  02/11/19    Authorization Type  Medicare    PT Start Time  0847    PT Stop Time  0924    PT Time Calculation (min)  37 min    Activity Tolerance  Patient tolerated treatment well    Behavior During Therapy  [facility] for tasks assessed/performed       Past Medical History:  Diagnosis Date  . Anemia   . Breast asymmetry 12/2015  . Breast cancer ([place]) 2017   right breast  . History of breast cancer 11/2014   right  . History of chemotherapy   . Migraines   . 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 Personal history of chemotherapy 2017  . Personal history of radiation therapy 2017    Past Surgical History:  Procedure Laterality Date  . BREAST LUMPECTOMY Right 2017  . BREAST RECONSTRUCTION Right 06/23/2015   Procedure: ONCOPLASTIC RIGHT BREAST REDUCTION;  Surgeon: Irene Limbo, MD;  Location: [place];  Service: Plastics;  Laterality: Right;  . BREAST REDUCTION SURGERY Left 06/23/2015   Procedure: MAMMARY REDUCTION  (BREAST) LEFT BREAST FOR SYMETRY (BILATERAL BREAST REDUCTION);  Surgeon: Irene Limbo, MD;  Location: [place];  Service: Plastics;  Laterality: Left;  . BREAST REDUCTION SURGERY Left 01/15/2016   Procedure: MAMMARY REDUCTION  (BREAST) LEFT;  Surgeon: Irene Limbo, MD;  Location: [place];  Service: Plastics;  Laterality: Left;  Marland Kitchen MASTOPEXY Left 01/15/2016   Procedure: POSSIBLE LEFT MASTOPEXY;  Surgeon: Irene Limbo, MD;  Location: [place];  Service: Plastics;  Laterality: Left;  . PORT-A-CATH REMOVAL Right 01/15/2016   Procedure: REMOVAL OF RIGHT PORT-A-CATH;  Surgeon: Irene Limbo, MD;  Location: [place];  Service: Plastics;  Laterality: Right;  . PORTACATH PLACEMENT Right 12/16/2014   Procedure: INSERTION PORT-A-CATH WITH ULTRASOUND;  Surgeon: Rolm Bookbinder, MD;  Location: [place];  Service: General;  Laterality: Right;  . RADIOACTIVE SEED GUIDED PARTIAL MASTECTOMY WITH AXILLARY SENTINEL LYMPH NODE BIOPSY Right 06/15/2015   Procedure: RADIOACTIVE SEED GUIDED PARTIAL MASTECTOMY WITH AXILLARY SENTINEL LYMPH NODE BIOPSY;  Surgeon: Rolm Bookbinder, MD;  Location: [place];  Service: General;  Laterality: Right;  . REDUCTION MAMMAPLASTY Bilateral 2017    There were no vitals filed for this visit.  Subjective Assessment - 02/05/19 0853    Subjective  I have been keeping it warm, doing okay. Does better when exercises are spread out during the day.    How long can you walk comfortably?  30+   hills are harder    Patient Stated Goals  Be able to walk and do everyday activities without pain    Currently in Pain?  Yes    Pain Score  2     Pain Location  Shoulder    Pain Orientation  Right    Pain Descriptors / Indicators  Aching    Pain Radiating Towards  Rt UE, Rt periscapular- can get chest pains sometimes when straining  Aggravating Factors   straining, lifting too much    Pain Relieving Factors  bandages, compression machine, stretches         [facility] PT Assessment - 02/05/19 0001      Assessment   Medical Diagnosis  lumbar, thoracic, and cervical back pain    Referring Provider (PT)  Magrinat, Sarajane Jews, MD    Onset Date/Surgical Date  10/29/18      AROM   Right Shoulder Flexion  152 Degrees    Right Shoulder ABduction  158 Degrees    Cervical Flexion  48    Cervical - Right Side Bend  44    Cervical - Left Side Bend  46    Cervical - Right Rotation  85     Cervical - Left Rotation  80    Lumbar Flexion  70    Lumbar - Right Side Bend  --   knee joint line, no pain   Lumbar - Left Side Bend  --   knee joint line, no pain   Lumbar - Right Rotation  --   no pain   Lumbar - Left Rotation  --   no pain     Strength   Overall Strength Comments  gross GHJ strength 5/5 no pain                   [facility] Adult PT Treatment/Exercise - 02/05/19 0001      Exercises   Exercises  Other Exercises    Other Exercises   reviewed exercises listed in Plan section      Lumbar Exercises: Aerobic   Nustep  10 min L5 Ue & LE             PT Education - 02/05/19 1320    Education Details  goals, POC, HEP    Person(s) Educated  Patient    Methods  Explanation    Comprehension  Verbalized understanding          PT Long Term Goals - 01/28/19 0959      PT LONG TERM GOAL #1   Title  Pt will be independent in her HEP and Progression.    Baseline  will finalize HEP in last 2 visits    Status  On-going      PT LONG TERM GOAL #2   Title  Pt will be able to improve her cervical rotation to >/= 50 degrees bilaterally in order to improve functional mobility.    Baseline  Rt 80 Lt 78    Status  Achieved      PT LONG TERM GOAL #3   Title  Pt will be able to improve hamstring flexiblity to >/= 80 degrees bilaterally to improve functional mobility.    Status  Achieved      PT LONG TERM GOAL #4   Title  pt will be able to amb community distances for grocery shopping >/= 30 minutes with pain </= 3/10 in her back.    Baseline  can be pretty comfortable for 30 min and then ache begins    Status  Achieved      PT LONG TERM GOAL #5   Title  Pt will pick up a bag of groceries off the floor using her L UE with no back pain reported demonstrating correct lifting posture.    Baseline  limited in endurance, can reach to low cabinets but  not to toes on  a bad day.    Status  Partially Met  Plan - 02/05/19 1312    Clinical  Impression Statement  Pt has made excellent progress since beginning PT but will continue to have limitations in function due to edema. At this time we will d/c to independent program consisting of low weight/low repetition spread throughout the day and progressed as tolerated as she would feel an increase in spasm and pain with progressions in the last few attempts. Pt agrees with the plan to work on this at home and can return in a few months for an increase in exercises after further strength improvements at this level.    PT Treatment/Interventions  ADLs/Self Care Home Management;Electrical Stimulation;Therapeutic exercise;Patient/family education;Manual techniques;Manual lymph drainage;Passive range of motion;Taping;Therapeutic activities;Functional mobility training    PT Home Exercise Plan  child pose, belly breathing, supine chest stretch, supine horiz abd, lat pull down motion, row, biceps/triceps band, D1 & D2, OH press back against wall    Consulted and Agree with Plan of Care  Patient       Patient will benefit from skilled therapeutic intervention in order to improve the following deficits and impairments:  Decreased activity tolerance, Decreased range of motion, Decreased strength, Pain, Difficulty walking, Postural dysfunction, Impaired flexibility  Visit Diagnosis: Acute bilateral low back pain without sciatica  Cervicalgia  Muscle weakness (generalized)  Abnormal posture  Pain in thoracic spine  Stiffness of right shoulder, not elsewhere classified  Right shoulder pain, unspecified chronicity     Problem List Patient Active Problem List   Diagnosis Date Noted  . Port catheter in place 12/28/2015  . Chemotherapy induced neutropenia ([place]) 05/14/2015  . Chemotherapy-induced neuropathy ([place]) 04/08/2015  . 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 Tooth ache 01/09/2015  . Genetic testing 12/26/2014  . Family history of breast cancer   . Malignant neoplasm of  lower-inner quadrant of right breast of female, estrogen receptor negative ([place]) 11/25/2014  . Migraine 11/19/2014    Tasnia Spegal C. Lainie Daubert PT, DPT 02/05/19 1:21 PM   [facility] [address] [place], [place], [address] Phone: [phone]   Fax:  [phone]  Name: ANABELLE BUNGERT MRN: [id number] Date of Birth: Dec 21, 1975

## 2019-02-15 ENCOUNTER — Other Ambulatory Visit: Payer: Self-pay | Admitting: Family Medicine

## 2019-02-15 DIAGNOSIS — Z853 Personal history of malignant neoplasm of breast: Secondary | ICD-10-CM

## 2019-02-19 ENCOUNTER — Other Ambulatory Visit: Payer: Self-pay

## 2019-02-19 ENCOUNTER — Ambulatory Visit
Admission: RE | Admit: 2019-02-19 | Discharge: 2019-02-19 | Disposition: A | Discharge status: Home / Self Care | Payer: Medicare Other | Source: Ambulatory Visit | Attending: Family Medicine | Admitting: Family Medicine

## 2019-02-19 DIAGNOSIS — R928 Other abnormal and inconclusive findings on diagnostic imaging of breast: Secondary | ICD-10-CM | POA: Diagnosis not present

## 2019-02-19 DIAGNOSIS — Z853 Personal history of malignant neoplasm of breast: Secondary | ICD-10-CM

## 2019-02-22 ENCOUNTER — Other Ambulatory Visit: Payer: Self-pay

## 2019-02-22 ENCOUNTER — Other Ambulatory Visit (HOSPITAL_COMMUNITY)
Admission: RE | Admit: 2019-02-22 | Discharge: 2019-02-22 | Disposition: A | Discharge status: Home / Self Care | Payer: Medicare Other | Source: Ambulatory Visit | Attending: Obstetrics & Gynecology | Admitting: Obstetrics & Gynecology

## 2019-02-22 ENCOUNTER — Encounter: Payer: Self-pay | Admitting: Obstetrics & Gynecology

## 2019-02-22 ENCOUNTER — Ambulatory Visit (INDEPENDENT_AMBULATORY_CARE_PROVIDER_SITE_OTHER): Payer: Medicare Other | Admitting: Obstetrics & Gynecology

## 2019-02-22 VITALS — BP 145/96 | HR 74 | Wt 225.0 lb

## 2019-02-22 DIAGNOSIS — R87619 Unspecified abnormal cytological findings in specimens from cervix uteri: Secondary | ICD-10-CM | POA: Diagnosis not present

## 2019-02-22 LAB — POCT PREGNANCY, URINE: Preg Test, Ur: NEGATIVE

## 2019-02-22 MED ORDER — IBUPROFEN 200 MG PO TABS
800.0000 mg | ORAL_TABLET | Freq: Once | ORAL | Status: AC
  Administered 2019-02-22: 12:00:00 800 mg via ORAL

## 2019-02-22 NOTE — Progress Notes (Signed)
    OFFICE COLPOSCOPY AND ENDOMETRIAL BIOPSY PROCEDURE NOTE  43 y.o. 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 Discussed role for HPV in cervical dysplasia, need for surveillance and need to rule out endometrial pathology with atypical glandular cells.  Patient given informed consent, signed copy in the chart, time out was performed.  Placed in lithotomy position. Cervix viewed with speculum and colposcope after application of acetic acid.   Colposcopy adequate? Yes Acetowhite lesion(s) noted at 5-6 o'clock; corresponding biopsies obtained.   ECC specimen obtained. The cervix was then prepped with Betadine. A single-toothed tenaculum was placed on the anterior lip of the cervix to stabilize it. The 3 mm pipelle was introduced into the endometrial cavity without difficulty to a depth of 10 cm, and a moderate amount of tissue was obtained and sent to pathology. The instruments were removed from the patient's vagina. Minimal bleeding from the cervix was noted. The patient tolerated the procedures well.  All specimens were labeled and sent to pathology.  Patient was given post procedure instructions.  Will follow up pathology and manage accordingly; patient will be contacted with results and recommendations.  Routine preventative health maintenance measures emphasized.    Verita Schneiders, MD, [place] Attending [place], [facility] for [employer], [place]

## 2019-02-22 NOTE — Patient Instructions (Signed)
   COLPOSCOPY POST-PROCEDURE INSTRUCTIONS  1. You may take Ibuprofen, Aleve or Tylenol for cramping if needed.  2. If Monsel's solution was used, you will have a black discharge.  3. Light bleeding is normal.  If bleeding is heavier than your period, please call.  4. Put nothing in your vagina until the bleeding or discharge stops (usually 2 or3 days).  5. We will call you within one week with biopsy results or discuss the results at your follow-up appointment if needed.    ENDOMETRIAL BIOPSY POST-PROCEDURE INSTRUCTIONS  6. You may take Ibuprofen, Aleve or Tylenol for pain if needed.  Cramping should resolve within in 24 hours.  7. You may have a small amount of spotting.  You should wear a mini pad for the next few days.  8. You may have intercourse after 24 hours.  9. You need to call if you have any pelvic pain, fever, heavy bleeding or foul smelling vaginal discharge.  10. Shower or bathe as normal  6. We will call you within one week with results or we will discuss   the results at your follow-up appointment if needed.

## 2019-02-25 LAB — SURGICAL PATHOLOGY

## 2019-05-15 DIAGNOSIS — C50311 Malignant neoplasm of lower-inner quadrant of right female breast: Secondary | ICD-10-CM | POA: Diagnosis not present

## 2019-07-05 ENCOUNTER — Other Ambulatory Visit: Payer: Self-pay | Admitting: *Deleted

## 2019-07-05 DIAGNOSIS — C50311 Malignant neoplasm of lower-inner quadrant of right female breast: Secondary | ICD-10-CM

## 2019-07-07 NOTE — Progress Notes (Signed)
[place]  Telephone:([phone] [phone]     ID: Susan Wilson DOB: 12/22/1975  MR#: [id number]  CBS#:[id number]  Patient Care Team: Patient, No Pcp Per as PCP - General (General Practice) Rolm Bookbinder, MD as Consulting Physician (General Surgery) Matsuko Kretz, Virgie Dad, MD as Consulting Physician (Oncology) [name], RN as Registered Nurse Rockwell Germany, RN as Registered Nurse Alda Berthold, DO as Consulting Physician (Neurology) PCP: Patient, No Pcp Per OTHER MD:   CHIEF COMPLAINT: Triple negative breast cancer  CURRENT TREATMENT: Observation   INTERVAL HISTORY:  Susan Wilson did not show for her 07/08/2019 appointment.  Since her last visit, she underwent bilateral diagnostic mammography with tomography at [facility] on 02/19/2019 showing: breast density category B; no evidence of malignancy in either breast.   She underwent pap smear on 12/31/2018, which revealed HPV positivity and atypical glandular cells. She proceeded to colposcopy and endometrial biopsy on 02/22/2019. Pathology from the procedure ([id number]) showed no evidence of malignancy.   REVIEW OF SYSTEMS: Susan Wilson    BREAST CANCER HISTORY: From the original intake note:  Susan Wilson herself noted a mass in her right breast sometime around April. Initially she thought it might be related to menstruation, but as it did not change and eventually became tender, she brought it to her physician's attention. On 11/20/2014 patient underwent bilateral diagnostic mammography with tomosynthesis and right breast ultrasonography at the [facility] Center. The breast density was category B. There was a hyperdense mass in the right lower inner quadrant associated with skin thickening. There was also a 5 mm nodule posteriorly at the 8:30 o'clock position in the right breast. There were several hyperdense nodules in the right axilla. On physical exam there was a firm fixed mass in the  right breast at the 5:00 position. By ultrasound this was lobulated and appear to involve the skin. It measures up to 4.1 cm. There was no sonographic correlation to the 5 mm nodule seen in a different area of the right breast. The right axilla showed 3 hypervascular lymph nodes with prominent cortical thickening, measuring less than 1.5 cm.  On 11/21/2014 the patient underwent right breast biopsy (5:00 mass) and biopsy of one of the suspicious right axillary lymph nodes. The pathology ([id number]) showed the breast biopsy to consist of invasive ductal carcinoma, grade 2, estrogen receptor and progesterone receptor negative, with an MIB-1 of 20%, and HER-2 equivocal, with the signals ratio of 1.41, but the average copy number per cell 4.35.  The patient's subsequent history is as detailed below   PAST MEDICAL HISTORY: Past Medical History:  Diagnosis Date  . Anemia   . Breast asymmetry 12/2015  . Breast cancer ([place]) 2017   right breast  . History of breast cancer 11/2014   right  . History of chemotherapy   . Migraines   . 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 Personal history of chemotherapy 2017  . Personal history of radiation therapy 2017    PAST SURGICAL HISTORY: Past Surgical History:  Procedure Laterality Date  . BREAST LUMPECTOMY Right 2017  . BREAST RECONSTRUCTION Right 06/23/2015   Procedure: ONCOPLASTIC RIGHT BREAST REDUCTION;  Surgeon: Irene Limbo, MD;  Location: [place];  Service: Plastics;  Laterality: Right;  . BREAST REDUCTION SURGERY Left 06/23/2015   Procedure: MAMMARY REDUCTION  (BREAST) LEFT BREAST FOR SYMETRY (BILATERAL BREAST REDUCTION);  Surgeon: Irene Limbo, MD;  Location: [place];  Service: Clinical cytogeneticist;  Laterality: Left;  . BREAST REDUCTION SURGERY Left 01/15/2016   Procedure: MAMMARY REDUCTION  (BREAST) LEFT;  Surgeon: Irene Limbo, MD;  Location: [place];  Service:  Plastics;  Laterality: Left;  Marland Kitchen MASTOPEXY Left 01/15/2016   Procedure: POSSIBLE LEFT MASTOPEXY;  Surgeon: Irene Limbo, MD;  Location: [place];  Service: Plastics;  Laterality: Left;  . PORT-A-CATH REMOVAL Right 01/15/2016   Procedure: REMOVAL OF RIGHT PORT-A-CATH;  Surgeon: Irene Limbo, MD;  Location: [place];  Service: Plastics;  Laterality: Right;  . PORTACATH PLACEMENT Right 12/16/2014   Procedure: INSERTION PORT-A-CATH WITH ULTRASOUND;  Surgeon: Rolm Bookbinder, MD;  Location: [place];  Service: General;  Laterality: Right;  . RADIOACTIVE SEED GUIDED PARTIAL MASTECTOMY WITH AXILLARY SENTINEL LYMPH NODE BIOPSY Right 06/15/2015   Procedure: RADIOACTIVE SEED GUIDED PARTIAL MASTECTOMY WITH AXILLARY SENTINEL LYMPH NODE BIOPSY;  Surgeon: Rolm Bookbinder, MD;  Location: [place];  Service: General;  Laterality: Right;  . REDUCTION MAMMAPLASTY Bilateral 2017    FAMILY HISTORY Family History  Problem Relation Age of Onset  . Mesothelioma Maternal Grandfather        asbestos exposure, died in his 8s  . Heart defect Sister 0  . Breast cancer Paternal Grandmother   . Diabetes Paternal Grandmother   . Cancer Paternal Grandfather        NOS  . Colon cancer Other 51       MGMs brother with colon cancer  . Cancer Other        several of MGF's sisters with cancer NOS  . Liver cancer Other        MGF's brother  . Heart disease Paternal Aunt   . Heart disease Paternal [name]    The patient's parents are living, her father being 90 and her mother 65 as of July 2016. The patient had no brothers. One sister died at age 44 from cardiac problems. The other sister is in good health. On the maternal side there is a history of colon cancer and an uncle age 30, liver cancer in a great uncle and mesothelioma in the maternal grandfather.   GYNECOLOGIC HISTORY:  No LMP recorded. Menarche age 75. The patient is GX P2. Her periods were interrupted with  chemotherapy, but have resumed (August 2018), although now very scant and brief   SOCIAL HISTORY: (Updated 07/04/2018) Susan Wilson works at [employer]. She formerly worked in Therapist, art for [employer]. Her husband Susan Wilson works for [employer]. The daughters are Susan Wilson and Susan Wilson, age 46 and 39 as of 06/2018. Arville Lime will be going to college for photography and videography. The patient attends a local [place]: Not in place   HEALTH MAINTENANCE: Social History   Tobacco Use  . Smoking status: Never Smoker  . Smokeless tobacco: Never Used  Substance Use Topics  . Alcohol use: Yes    Comment: rarely  . Drug use: No     Colonoscopy: n/a (age)  PAP: 12/2018, HPV+, atypical cells / biopsy negative  Bone density: n/a (age)  Lipid panel:  No Known Allergies  Current Outpatient Medications  Medication Sig Dispense Refill  . b complex vitamins tablet Take 1 tablet by mouth daily.    . cyclobenzaprine (FLEXERIL) 10 MG tablet Take 1 tablet (10 mg total) by mouth 2 (two) times daily as needed for muscle spasms. 20 tablet 0  . ferrous sulfate 325 (65 FE) MG tablet Take 325 mg by  mouth daily with breakfast.    . gabapentin (NEURONTIN) 300 MG capsule Take 1 capsule (300 mg total) by mouth at bedtime. 90 capsule 4  . LORazepam (ATIVAN) 0.5 MG tablet Take 1 tablet (0.5 mg total) by mouth at bedtime. 30 tablet 0  . meloxicam (MOBIC) 15 MG tablet Take 1 tablet (15 mg total) by mouth daily. 60 tablet 5  . nortriptyline (PAMELOR) 10 MG capsule Start nortriptyline 71m at bedtime for 2 week, then increase to 2 tablet at bedtime 60 capsule 5  . ondansetron (ZOFRAN) 4 MG tablet TAKE 1 TABLET BY MOUTH EVERY 8 HOURS AS NEEDED FOR NAUSEA OR VOMITING 30 tablet 0  . SUMAtriptan (IMITREX) 50 MG tablet Take 1 tablet (50 mg total) by mouth every 2 (two) hours as needed for migraine. Mayrepeat in 2 hrs if needed. 10 tablet 0  . vitamin E (VITAMIN E)  400 UNIT capsule Take 1 capsule (400 Units total) by mouth daily. 30 capsule 0   No current facility-administered medications for this visit.    OBJECTIVE: Young African-American woman  There were no vitals filed for this visit.   There is no height or weight on file to calculate BMI.        LAB RESULTS:  CMP  CBC Latest Ref Rng & Units 07/04/2018 02/01/2018 12/26/2016  WBC 4.0 - 10.5 K/uL 6.3 5.9 5.4  Hemoglobin 12.0 - 15.0 g/dL 11.5(L) 11.8 12.4  Hematocrit 36.0 - 46.0 % 37.5 37.6 38.4  Platelets 150 - 400 K/uL 269 263 245   CMP Latest Ref Rng & Units 07/04/2018 02/01/2018 12/26/2016  Glucose 70 - 99 mg/dL 95 87 102  BUN 6 - 20 mg/dL 11 11 10.4  Creatinine 0.44 - 1.00 mg/dL 0.79 0.77 0.8  Sodium 135 - 145 mmol/L 139 136 138  Potassium 3.5 - 5.1 mmol/L 3.7 4.2 4.2  Chloride 98 - 111 mmol/L 105 103 -  CO2 22 - 32 mmol/L _0 Calcium 8.9 - 10.3 mg/dL 9.0 9.3 9.6  Total Protein 6.5 - 8.1 g/dL 7.8 7.8 7.4  Total Bilirubin 0.3 - 1.2 mg/dL 0.5 0.8 0.82  Alkaline Phos 38 - 126 U/L 74 74 74  AST 15 - 41 U/L 11(L) 11(L) 16  ALT 0 - 44 U/L _1 Urinalysis    Component Value Date/Time   LABSPEC 1.020 05/06/2009 1439   PHURINE 7.0 05/06/2009 1439   GLUCOSEU NEGATIVE 05/06/2009 1439   HGBUR NEGATIVE 05/06/2009 1439   BILIRUBINUR NEGATIVE 05/06/2009 1439   KETONESUR NEGATIVE 05/06/2009 1439   PROTEINUR NEGATIVE 05/06/2009 1439   UROBILINOGEN 0.2 05/06/2009 1439   NITRITE NEGATIVE 05/06/2009 1439   LEUKOCYTESUR  05/06/2009 1439    NEGATIVE Biochemical Testing Only. Please order routine urinalysis from main lab if confirmatory testing is needed.    STUDIES: No results found.   ASSESSMENT: 44y.o. BRCA negative [place] woman s/p Right breast biopsy lower inner quadrant 11/21/2014 for a clinical T2 NX, stage 2 invasive ductal carcinoma, grade 2 or 3, estrogen and progesterone receptor negative, HER-2 equivocal, with an Mib-1 of 20%  (a) biopsy of a suspicious  axillary lymph node same day was negative, but discordant  (b) review of 80 additional tumor cells by FISH still showed HER-2 not amplified; tumor should be treated as a triple negative  (1) neoadjuvant chemotherapy started 01/01/2015 consisting of cyclophosphamide and doxorubicin in dose dense fashion 4, completed 02/13/2015, followed by paclitaxel/ carboplatin weekly 12 started 02/26/2015,  completed 05/14/2015.   (2) genetics testing 12/15/2014 through the Breast/Ovarian gene panel offered by [facility] found no deleterious mutations in ATM, BARD1, BRCA1, BRCA2, BRIP1, CDH1, CHEK2, EPCAM, FANCC, MLH1, MSH2, MSH6, NBN, PALB2, PMS2, PTEN, RAD51C, RAD51D, TP53, and XRCC2  (3) right lumpectomy and sentinel lymph node sampling 06/15/2015 showed a residual pT1b pN0 invasive ductal carcinoma, grade 1, with negative margins. (There were actually a few microscopic nests of residual tumor and 0.9 is the longest distance between 2 of the microscopic nests--there was not enough tissue for repeat prognostic panel.)  (a) bilateral reduction mammoplasty 06/23/2015 showed no malignancy in either breast  (4) adjuvant radiation 09/01/2015-10/19/2015:  Right breast / 45 Gray @ 1.8 Pearline Cables per fraction x 25 fractions Right breast boost / 16 Gray at [employer] per fraction x 8 fractions    PLAN: Jalaine did not show for her 07/08/2019 appointment.  Reschedule letter to follow-up  Susan Wilson, Virgie Dad, MD  07/07/19 1:31 PM Medical Oncology and Hematology [facility] [place], [place] [address] Tel. [phone]    Fax. [phone]   I, Wilburn Mylar, am acting as scribe for Dr. Virgie Dad. Susan Wilson.  .   *Total Encounter Time as defined by the Centers for Medicare and Medicaid Services includes, in addition to the face-to-face time of a patient visit (documented in the note above) non-face-to-face time: obtaining and reviewing outside history, ordering and reviewing medications, tests  or procedures, care coordination (communications with other health care professionals or caregivers) and documentation in the medical record.

## 2019-07-08 ENCOUNTER — Encounter: Payer: Self-pay | Admitting: Oncology

## 2019-07-08 ENCOUNTER — Inpatient Hospital Stay: Payer: Medicare Other | Attending: Oncology | Admitting: Oncology

## 2019-07-08 ENCOUNTER — Inpatient Hospital Stay: Payer: Medicare Other

## 2019-07-08 DIAGNOSIS — Z171 Estrogen receptor negative status [ER-]: Secondary | ICD-10-CM

## 2019-07-08 DIAGNOSIS — G43001 Migraine without aura, not intractable, with status migrainosus: Secondary | ICD-10-CM

## 2019-07-08 DIAGNOSIS — C50311 Malignant neoplasm of lower-inner quadrant of right female breast: Secondary | ICD-10-CM

## 2019-07-10 ENCOUNTER — Telehealth: Payer: Self-pay | Admitting: Oncology

## 2019-07-10 NOTE — Telephone Encounter (Signed)
Rescheduled per 2/22 sch msg, pt req. Called and spoke with pt, confirmed 3/2 appt

## 2019-07-15 NOTE — Progress Notes (Signed)
[place]  [phone]) [phone] [phone]) [phone]     ID: Susan Wilson DOB: [birth year]  MR#: [id number]  JKK#:[id number]  Patient Care Team: Patient, No Pcp Per as PCP - General (General Practice) Rolm Bookbinder, MD as Consulting Physician (General Surgery) Maeghan Canny, Virgie Dad, MD as Consulting Physician (Oncology) [name], RN as Registered Nurse Rockwell Germany, RN as Registered Nurse Alda Berthold, DO as Consulting Physician (Neurology) OTHER MD:   CHIEF COMPLAINT: Triple negative breast cancer  CURRENT TREATMENT: Observation   INTERVAL HISTORY:  Walker returns today for follow up of her triple negative breast cancer.  Since her last visit, she underwent bilateral diagnostic mammography with tomography at [facility] on 02/19/2019 showing: breast density category B; no evidence of malignancy in either breast.   She underwent pap smear on 12/31/2018, which revealed HPV positivity and atypical glandular cells. She proceeded to colposcopy and endometrial biopsy on 02/22/2019. Pathology from the procedure ([id number]) showed no evidence of malignancy.   REVIEW OF SYSTEMS: [name] is working virtually.  Her older daughter is also in her first year of college mostly virtually.  For exercise [name] takes walks, 2-3 times a week, usually 15 to 30 minutes at a time.  She has not yet received her COVID-19 vaccine.  A detailed review of systems today was otherwise stable   BREAST CANCER HISTORY: From the original intake note:  [name] herself noted a mass in her right breast sometime around April. Initially she thought it might be related to menstruation, but as it did not change and eventually became tender, she brought it to her physician's attention. On 11/20/2014 patient underwent bilateral diagnostic mammography with tomosynthesis and right breast ultrasonography at the [facility] Center. The breast density was category B. There was a  hyperdense mass in the right lower inner quadrant associated with skin thickening. There was also a 5 mm nodule posteriorly at the 8:30 o'clock position in the right breast. There were several hyperdense nodules in the right axilla. On physical exam there was a firm fixed mass in the right breast at the 5:00 position. By ultrasound this was lobulated and appear to involve the skin. It measures up to 4.1 cm. There was no sonographic correlation to the 5 mm nodule seen in a different area of the right breast. The right axilla showed 3 hypervascular lymph nodes with prominent cortical thickening, measuring less than 1.5 cm.  On 11/21/2014 the patient underwent right breast biopsy (5:00 mass) and biopsy of one of the suspicious right axillary lymph nodes. The pathology ([id number]) showed the breast biopsy to consist of invasive ductal carcinoma, grade 2, estrogen receptor and progesterone receptor negative, with an MIB-1 of 20%, and HER-2 equivocal, with the signals ratio of 1.41, but the average copy number per cell 4.35.  The patient's subsequent history is as detailed below   PAST MEDICAL HISTORY: Past Medical History:  Diagnosis Date  . Anemia   . Breast asymmetry 12/2015  . Breast cancer ([facility]) 2017   right breast  . History of breast cancer 11/2014   right  . History of chemotherapy   . Migraines   . 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 Personal history of chemotherapy 2017  . Personal history of radiation therapy 2017    PAST SURGICAL HISTORY: Past Surgical History:  Procedure Laterality Date  . BREAST LUMPECTOMY Right 2017  . BREAST RECONSTRUCTION Right 06/23/2015   Procedure: ONCOPLASTIC RIGHT  BREAST REDUCTION;  Surgeon: Irene Limbo, MD;  Location: [facility];  Service: Plastics;  Laterality: Right;  . BREAST REDUCTION SURGERY Left 06/23/2015   Procedure: MAMMARY REDUCTION  (BREAST) LEFT BREAST FOR SYMETRY (BILATERAL BREAST  REDUCTION);  Surgeon: Irene Limbo, MD;  Location: [name];  Service: Plastics;  Laterality: Left;  . BREAST REDUCTION SURGERY Left 01/15/2016   Procedure: MAMMARY REDUCTION  (BREAST) LEFT;  Surgeon: Irene Limbo, MD;  Location: [facility];  Service: Plastics;  Laterality: Left;  Marland Kitchen MASTOPEXY Left 01/15/2016   Procedure: POSSIBLE LEFT MASTOPEXY;  Surgeon: Irene Limbo, MD;  Location: [facility];  Service: Plastics;  Laterality: Left;  . PORT-A-CATH REMOVAL Right 01/15/2016   Procedure: REMOVAL OF RIGHT PORT-A-CATH;  Surgeon: Irene Limbo, MD;  Location: [facility];  Service: Plastics;  Laterality: Right;  . PORTACATH PLACEMENT Right 12/16/2014   Procedure: INSERTION PORT-A-CATH WITH ULTRASOUND;  Surgeon: Rolm Bookbinder, MD;  Location: [facility];  Service: General;  Laterality: Right;  . RADIOACTIVE SEED GUIDED PARTIAL MASTECTOMY WITH AXILLARY SENTINEL LYMPH NODE BIOPSY Right 06/15/2015   Procedure: RADIOACTIVE SEED GUIDED PARTIAL MASTECTOMY WITH AXILLARY SENTINEL LYMPH NODE BIOPSY;  Surgeon: Rolm Bookbinder, MD;  Location: [facility];  Service: General;  Laterality: Right;  . REDUCTION MAMMAPLASTY Bilateral 2017    FAMILY HISTORY Family History  Problem Relation Age of Onset  . Mesothelioma Maternal Grandfather        asbestos exposure, died in his 25s  . Heart defect Sister 0  . Breast cancer Paternal Grandmother   . Diabetes Paternal Grandmother   . Cancer Paternal Grandfather        NOS  . Colon cancer Other 55       MGMs brother with colon cancer  . Cancer Other        several of MGF's sisters with cancer NOS  . Liver cancer Other        MGF's brother  . Heart disease Paternal Aunt   . Heart disease Paternal [name]    The patient's parents are living, her father being 51 and her mother 39 as of July 2016. The patient had no brothers. One sister died at age 60 from cardiac problems. The other sister  is in good health. On the maternal side there is a history of colon cancer and an uncle age 69, liver cancer in a great uncle and mesothelioma in the maternal grandfather.   GYNECOLOGIC HISTORY:  No LMP recorded. Menarche age 44. The patient is GX P2. Her periods were interrupted with chemotherapy, but have resumed (August 2018), although now very scant and brief   SOCIAL HISTORY: (Updated 07/04/2018) [place] works at [employer]. She formerly worked in Therapist, art for [employer]. Her husband Antrell works for [employer]. The daughters are [place] and Lovie Macadamia, age 14 and 65 as of 06/2018. Arville Lime will be going to college for photography and videography. The patient attends a local [employer] Not in place   HEALTH MAINTENANCE: Social History   Tobacco Use  . Smoking status: Never Smoker  . Smokeless tobacco: Never Used  Substance Use Topics  . Alcohol use: Yes    Comment: rarely  . Drug use: No     Colonoscopy: n/a (age)  PAP: 12/2018, HPV+, atypical cells / biopsy negative  Bone density: n/a (age)  Lipid panel:  No Known Allergies  Current Outpatient Medications  Medication Sig Dispense Refill  .  b complex vitamins tablet Take 1 tablet by mouth daily.    . cyclobenzaprine (FLEXERIL) 10 MG tablet Take 1 tablet (10 mg total) by mouth 2 (two) times daily as needed for muscle spasms. 20 tablet 0  . ferrous sulfate 325 (65 FE) MG tablet Take 325 mg by mouth daily with breakfast.    . gabapentin (NEURONTIN) 300 MG capsule Take 1 capsule (300 mg total) by mouth at bedtime. 90 capsule 4  . LORazepam (ATIVAN) 0.5 MG tablet Take 1 tablet (0.5 mg total) by mouth at bedtime. 30 tablet 0  . meloxicam (MOBIC) 15 MG tablet Take 1 tablet (15 mg total) by mouth daily. 60 tablet 5  . nortriptyline (PAMELOR) 10 MG capsule Start nortriptyline 33m at bedtime for 2 week, then increase to 2 tablet at bedtime 60 capsule 5  . ondansetron  (ZOFRAN) 4 MG tablet TAKE 1 TABLET BY MOUTH EVERY 8 HOURS AS NEEDED FOR NAUSEA OR VOMITING 30 tablet 0  . SUMAtriptan (IMITREX) 50 MG tablet Take 1 tablet (50 mg total) by mouth every 2 (two) hours as needed for migraine. Mayrepeat in 2 hrs if needed. 10 tablet 0  . vitamin E (VITAMIN E) 400 UNIT capsule Take 1 capsule (400 Units total) by mouth daily. 30 capsule 0   No current facility-administered medications for this visit.    OBJECTIVE: Young African-American woman in no acute distress Vitals:   07/16/19 0953  BP: 130/86  Pulse: 80  Resp: 18  Temp: 98 F (36.7 C)  SpO2: 100%     Body mass index is 37.61 kg/m.     Sclerae unicteric, EOMs intact Wearing a mask No cervical or supraclavicular adenopathy Lungs no rales or rhonchi Heart regular rate and rhythm Abd soft, nontender, positive bowel sounds MSK no focal spinal tenderness, no upper extremity lymphedema Neuro: nonfocal, well oriented, appropriate affect Breasts: Both breasts are status post a reduction mammoplasty.  The right breast is also status post lumpectomy and radiation.  There are the expected posttreatment changes but no evidence of disease recurrence.  The left breast is benign.  Both axillae are benign.   LAB RESULTS:  CMP  CBC Latest Ref Rng & Units 07/16/2019 07/04/2018 02/01/2018  WBC 4.0 - 10.5 K/uL 5.6 6.3 5.9  Hemoglobin 12.0 - 15.0 g/dL 12.4 11.5(L) 11.8  Hematocrit 36.0 - 46.0 % 40.1 37.5 37.6  Platelets 150 - 400 K/uL 254 269 263   CMP Latest Ref Rng & Units 07/16/2019 07/04/2018 02/01/2018  Glucose 70 - 99 mg/dL 103(H) 95 87  BUN 6 - 20 mg/dL 10 11 11   Creatinine 0.44 - 1.00 mg/dL 0.74 0.79 0.77  Sodium 135 - 145 mmol/L 137 139 136  Potassium 3.5 - 5.1 mmol/L 4.1 3.7 4.2  Chloride 98 - 111 mmol/L 105 105 103  CO2 22 - 32 mmol/L 23 26 27   Calcium 8.9 - 10.3 mg/dL 8.7(L) 9.0 9.3  Total Protein 6.5 - 8.1 g/dL 7.9 7.8 7.8  Total Bilirubin 0.3 - 1.2 mg/dL 0.7 0.5 0.8  Alkaline Phos 38 - 126 U/L 64 74  74  AST 15 - 41 U/L 17 11(L) 11(L)  ALT 0 - 44 U/L 15 10 11       Urinalysis    Component Value Date/Time   LABSPEC 1.020 05/06/2009 1439   PHURINE 7.0 05/06/2009 1439   GLUCOSEU NEGATIVE 05/06/2009 1439   HGBUR NEGATIVE 05/06/2009 1439   BILIRUBINUR NEGATIVE 05/06/2009 1439   KETONESUR NEGATIVE 05/06/2009 1439   PROTEINUR NEGATIVE  05/06/2009 1439   UROBILINOGEN 0.2 05/06/2009 1439   NITRITE NEGATIVE 05/06/2009 1439   LEUKOCYTESUR  05/06/2009 1439    NEGATIVE Biochemical Testing Only. Please order routine urinalysis from main lab if confirmatory testing is needed.    STUDIES: No results found.   ASSESSMENT: 44 y.o. BRCA negative  woman s/p Right breast biopsy lower inner quadrant 11/21/2014 for a clinical T2 NX, stage 2 invasive ductal carcinoma, grade 2 or 3, estrogen and progesterone receptor negative, HER-2 equivocal, with an Mib-1 of 20%  (a) biopsy of a suspicious axillary lymph node same day was negative, but discordant  (b) review of 80 additional tumor cells by FISH still showed HER-2 not amplified; tumor should be treated as a triple negative  (1) neoadjuvant chemotherapy started 01/01/2015 consisting of cyclophosphamide and doxorubicin in dose dense fashion 4, completed 02/13/2015, followed by paclitaxel/ carboplatin weekly 12 started 02/26/2015, completed 05/14/2015.   (2) genetics testing 12/15/2014 through the Breast/Ovarian gene panel offered by [facility] found no deleterious mutations in ATM, BARD1, BRCA1, BRCA2, BRIP1, CDH1, CHEK2, EPCAM, FANCC, MLH1, MSH2, MSH6, NBN, PALB2, PMS2, PTEN, RAD51C, RAD51D, TP53, and XRCC2  (3) right lumpectomy and sentinel lymph node sampling 06/15/2015 showed a residual pT1b pN0 invasive ductal carcinoma, grade 1, with negative margins. (There were actually a few microscopic nests of residual tumor and 0.9 is the longest distance between 2 of the microscopic nests--there was not enough tissue for repeat prognostic panel.)  (a)  bilateral reduction mammoplasty 06/23/2015 showed no malignancy in either breast  (4) adjuvant radiation 09/01/2015-10/19/2015:  Right breast / 45 Gray @ 1.8 Pearline Cables per fraction x 25 fractions Right breast boost / 16 Gray at [employer] per fraction x 8 fractions    PLAN: Aaima is now a little over 3 years out from definitive surgery for her breast cancer with no evidence of disease recurrence.  This is very favorable.  She will have mammography in October and see me later that month.  Ideally she will see her surgeon Dr. Donne Hazel 6 months later, in April 2022.  I have encouraged her to receive the COVID-19 vaccine as soon as becomes available to her  She knows to call for any other issue that may develop before the next visit.  Total encounter time 20 minutes.*     Froilan Mclean, Virgie Dad, MD  07/16/19 2:26 PM Medical Oncology and Hematology [facility] [place], [place] [address] Tel. [phone]    Fax. [phone]   I, Wilburn Mylar, am acting as scribe for Dr. Virgie Dad. Terrica Duecker.  I, Lurline Del MD, have reviewed the above documentation for accuracy and completeness, and I agree with the above.    *Total Encounter Time as defined by the Centers for Medicare and Medicaid Services includes, in addition to the face-to-face time of a patient visit (documented in the note above) non-face-to-face time: obtaining and reviewing outside history, ordering and reviewing medications, tests or procedures, care coordination (communications with other health care professionals or caregivers) and documentation in the medical record.

## 2019-07-16 ENCOUNTER — Inpatient Hospital Stay (HOSPITAL_BASED_OUTPATIENT_CLINIC_OR_DEPARTMENT_OTHER): Payer: Medicare Other | Admitting: Oncology

## 2019-07-16 ENCOUNTER — Other Ambulatory Visit: Payer: Self-pay

## 2019-07-16 ENCOUNTER — Inpatient Hospital Stay: Payer: Medicare Other | Attending: Oncology

## 2019-07-16 VITALS — BP 130/86 | HR 80 | Temp 98.0°F | Resp 18 | Ht 65.0 in | Wt 226.0 lb

## 2019-07-16 DIAGNOSIS — Z809 Family history of malignant neoplasm, unspecified: Secondary | ICD-10-CM | POA: Diagnosis not present

## 2019-07-16 DIAGNOSIS — C50311 Malignant neoplasm of lower-inner quadrant of right female breast: Secondary | ICD-10-CM

## 2019-07-16 DIAGNOSIS — Z853 Personal history of malignant neoplasm of breast: Secondary | ICD-10-CM | POA: Insufficient documentation

## 2019-07-16 DIAGNOSIS — Z808 Family history of malignant neoplasm of other organs or systems: Secondary | ICD-10-CM | POA: Diagnosis not present

## 2019-07-16 DIAGNOSIS — Z923 Personal history of irradiation: Secondary | ICD-10-CM | POA: Insufficient documentation

## 2019-07-16 DIAGNOSIS — Z171 Estrogen receptor negative status [ER-]: Secondary | ICD-10-CM | POA: Diagnosis not present

## 2019-07-16 DIAGNOSIS — Z9221 Personal history of antineoplastic chemotherapy: Secondary | ICD-10-CM | POA: Diagnosis not present

## 2019-07-16 DIAGNOSIS — Z803 Family history of malignant neoplasm of breast: Secondary | ICD-10-CM | POA: Insufficient documentation

## 2019-07-16 LAB — CBC WITH DIFFERENTIAL (CANCER CENTER ONLY)
Abs Immature Granulocytes: 0.01 10*3/uL (ref 0.00–0.07)
Basophils Absolute: 0 10*3/uL (ref 0.0–0.1)
Basophils Relative: 0 %
Eosinophils Absolute: 0.1 10*3/uL (ref 0.0–0.5)
Eosinophils Relative: 1 %
HCT: 40.1 % (ref 36.0–46.0)
Hemoglobin: 12.4 g/dL (ref 12.0–15.0)
Immature Granulocytes: 0 %
Lymphocytes Relative: 28 %
Lymphs Abs: 1.6 10*3/uL (ref 0.7–4.0)
MCH: 26.1 pg (ref 26.0–34.0)
MCHC: 30.9 g/dL (ref 30.0–36.0)
MCV: 84.2 fL (ref 80.0–100.0)
Monocytes Absolute: 0.4 10*3/uL (ref 0.1–1.0)
Monocytes Relative: 8 %
Neutro Abs: 3.5 10*3/uL (ref 1.7–7.7)
Neutrophils Relative %: 63 %
Platelet Count: 254 10*3/uL (ref 150–400)
RBC: 4.76 MIL/uL (ref 3.87–5.11)
RDW: 14 % (ref 11.5–15.5)
WBC Count: 5.6 10*3/uL (ref 4.0–10.5)
nRBC: 0 % (ref 0.0–0.2)

## 2019-07-16 LAB — CMP (CANCER CENTER ONLY)
ALT: 15 U/L (ref 0–44)
AST: 17 U/L (ref 15–41)
Albumin: 3.9 g/dL (ref 3.5–5.0)
Alkaline Phosphatase: 64 U/L (ref 38–126)
Anion gap: 9 (ref 5–15)
BUN: 10 mg/dL (ref 6–20)
CO2: 23 mmol/L (ref 22–32)
Calcium: 8.7 mg/dL — ABNORMAL LOW (ref 8.9–10.3)
Chloride: 105 mmol/L (ref 98–111)
Creatinine: 0.74 mg/dL (ref 0.44–1.00)
GFR, Est AFR Am: >60 mL/min (ref >60)
GFR, Estimated: >60 mL/min (ref >60)
Glucose, Bld: 103 mg/dL — ABNORMAL HIGH (ref 70–99)
Potassium: 4.1 mmol/L (ref 3.5–5.1)
Sodium: 137 mmol/L (ref 135–145)
Total Bilirubin: 0.7 mg/dL (ref 0.3–1.2)
Total Protein: 7.9 g/dL (ref 6.5–8.1)

## 2019-07-17 ENCOUNTER — Telehealth: Payer: Self-pay | Admitting: Oncology

## 2019-07-17 LAB — CANCER ANTIGEN 27.29: CA 27.29: 22.6 U/mL (ref 0.0–38.6)

## 2019-07-17 NOTE — Telephone Encounter (Signed)
I left a message regarding schedule  

## 2019-09-23 ENCOUNTER — Other Ambulatory Visit: Payer: Self-pay | Admitting: *Deleted

## 2019-09-23 DIAGNOSIS — C50311 Malignant neoplasm of lower-inner quadrant of right female breast: Secondary | ICD-10-CM

## 2019-09-23 MED ORDER — SUMATRIPTAN SUCCINATE 50 MG PO TABS: 50.0000 mg | ORAL_TABLET | ORAL | 1 refills | Status: DC | PRN

## 2019-09-26 ENCOUNTER — Encounter: Payer: Self-pay | Admitting: General Practice

## 2019-09-26 NOTE — Progress Notes (Signed)
[place] Note  Met with Susan Wilson and her husband Susan Wilson for one hour in my office per her request for support regarding survivorship adjustment. Per Susan Wilson, one of her biggest struggles is with self-esteem, which is new to her since diagnosis and treatment, due to appearance changes and "feeling like a burden" to loved ones while she was unwell from treatment. Recently, journaling and talking with her family have been two key tools for processing, reflecting, and making meaning. Provided empathic listening, normalization of feelings, pastoral reflection, affirmation of strengths, and a simple naming/claiming exercise called "Brags, Gratitudes, and Desires." Susan Wilson welcomes a counseling referral for ongoing work. I will call her with suggestions. Couple has my card and knows to contact me as needed/desired for further support.   [place], [place], [facility] Pager [phone] Voicemail [phone]

## 2019-09-27 ENCOUNTER — Encounter: Payer: Self-pay | Admitting: General Practice

## 2019-09-27 NOTE — Progress Notes (Signed)
[place] Spiritual Care Note  Spoke with Susan Wilson by phone to recommend [facility] as a starting point for choosing a counselor. She plans to phone to verify insurance coverage and then request referral from medical oncologist or primary care physician.   [place], [place], [facility] Pager [phone] Voicemail [phone]

## 2019-10-01 ENCOUNTER — Other Ambulatory Visit: Payer: Self-pay | Admitting: *Deleted

## 2019-10-01 DIAGNOSIS — Z803 Family history of malignant neoplasm of breast: Secondary | ICD-10-CM

## 2019-10-01 DIAGNOSIS — Z171 Estrogen receptor negative status [ER-]: Secondary | ICD-10-CM

## 2019-10-01 NOTE — Progress Notes (Signed)
amb  

## 2019-10-07 ENCOUNTER — Encounter: Payer: Self-pay | Admitting: General Practice

## 2019-10-07 NOTE — Progress Notes (Signed)
[place] Spiritual Care Note  Received and returned calls from [place]. She has set up a counselor appointment with [facility], but first available wasn't until July, so per her request we will set up follow-up appointments for chaplain support in the meantime. Next is tomorrow 5/25 at [place], [place], [facility] Pager [phone] Voicemail [phone]

## 2019-10-08 ENCOUNTER — Encounter: Payer: Self-pay | Admitting: General Practice

## 2019-10-08 NOTE — Progress Notes (Signed)
[place] Spiritual Care Note  Had follow-up Spiritual Care session with Susan Wilson in my office. She is working her tools of alone time, meditation, writing, motivational keywords, and ritual to process and "put down some of the bricks" (burdens, habits of mind, etc) that have been weighing her down. One of the images we used for this work was "dumping one's bucket" and then examining and sorting through what's been weighing it down. Susan Wilson has been working on not picking the bricks back up again. 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 She is also looking forward to a trip to the beach this weekend with her husband and several friends. We scheduled a follow-up appointment for 2pm on Thursday 6/3.   [place], [place], [facility] Pager [phone] Voicemail [phone]

## 2019-10-17 ENCOUNTER — Encounter: Payer: Self-pay | Admitting: General Practice

## 2019-10-17 ENCOUNTER — Other Ambulatory Visit: Payer: Self-pay

## 2019-10-17 NOTE — Progress Notes (Signed)
[facility] Spiritual Care Note  Had a follow-up appointment with [place] and husband Antrell in my office. Their beach trip provided Loleta more opportunity for reflective writing as well as practice "chipping away at the bricks" of her emotional burdens and behavior patterns and "putting them down" wherever possible. One metaphor that she used today was getting in touch with her internal fierceness (power/authority/self-determination) on the runway as a phoenix that rises from the ashes (of hard things, the "bricks," etc). She feels like she can tap into this fierceness in other contexts, too, such as tackling whatever turns out to be the next step in her treatment, so she is using the phoenix as an image to empower her development of survivorship or continued patient identity. We plan to meet again next Thursday 6/10 at [place], [place], [facility] Pager [phone] Voicemail [phone]

## 2019-10-24 ENCOUNTER — Encounter: Payer: Self-pay | Admitting: General Practice

## 2019-10-24 NOTE — Progress Notes (Signed)
[facility] Spiritual Care Note  Rescheduled to 6/17 at [place], [place], [facility] Pager [phone] Voicemail [phone]

## 2019-10-31 ENCOUNTER — Encounter: Payer: Self-pay | Admitting: General Practice

## 2019-10-31 NOTE — Progress Notes (Signed)
[facility] Spiritual Care Note  Appointment postponed per Yoselin's request. Left return voicemail with possible appointment times.   [place], [place], [facility] Pager [phone] Voicemail [phone]

## 2019-11-05 ENCOUNTER — Other Ambulatory Visit: Payer: Self-pay

## 2019-11-05 ENCOUNTER — Encounter: Payer: Self-pay | Admitting: General Practice

## 2019-11-05 NOTE — Progress Notes (Signed)
[place] Spiritual Care Note  Met with Addy for one-hour appointment in my office. She used the opportunity to share and process about ongoing adjustments, progress on her goals, and coping with challenging communication in the face of family grief. Writing and quiet time continue to be meaningful tools for her. Provided empathic listening, normalization of feelings, and queries for reflection. We plan to meet again next Tuesday at 3:30 for pastoral support.   [place], [place], [facility] Pager [phone] Voicemail [phone]

## 2019-11-12 ENCOUNTER — Encounter: Payer: Self-pay | Admitting: General Practice

## 2019-11-12 NOTE — Progress Notes (Signed)
[place] Note  Corynn is not feeling well today, so we rescheduled for Friday 7/2 at 3:00pm.   Chaplain Lorrin Jackson, [place], [facility] Pager [phone] Voicemail [phone]

## 2019-11-15 ENCOUNTER — Encounter: Payer: Self-pay | Admitting: General Practice

## 2019-11-15 NOTE — Progress Notes (Signed)
[place] Note  Susan Wilson is still struggling with pain, so we rescheduled for Tuesday 7/6 at [place], [place], [facility] Pager [phone] Voicemail [phone]

## 2019-11-20 ENCOUNTER — Encounter: Payer: Self-pay | Admitting: General Practice

## 2019-11-20 NOTE — Progress Notes (Signed)
[facility] Spiritual Care Note  Rescheduled by phone for Thursday 7/8 at 3:00pm.   Chaplain Lorrin Jackson, [place], [facility] Pager [phone] Voicemail [phone]

## 2019-11-26 ENCOUNTER — Ambulatory Visit (INDEPENDENT_AMBULATORY_CARE_PROVIDER_SITE_OTHER): Payer: Medicare Other | Admitting: Psychology

## 2019-11-26 DIAGNOSIS — F4321 Adjustment disorder with depressed mood: Secondary | ICD-10-CM | POA: Diagnosis not present

## 2019-12-10 ENCOUNTER — Ambulatory Visit (INDEPENDENT_AMBULATORY_CARE_PROVIDER_SITE_OTHER): Payer: Medicare Other | Admitting: Psychology

## 2019-12-10 DIAGNOSIS — F4321 Adjustment disorder with depressed mood: Secondary | ICD-10-CM | POA: Diagnosis not present

## 2019-12-24 ENCOUNTER — Ambulatory Visit (INDEPENDENT_AMBULATORY_CARE_PROVIDER_SITE_OTHER): Payer: Medicare Other | Admitting: Psychology

## 2019-12-24 DIAGNOSIS — F4321 Adjustment disorder with depressed mood: Secondary | ICD-10-CM

## 2020-01-30 ENCOUNTER — Ambulatory Visit: Payer: Medicare Other | Admitting: Psychology

## 2020-02-11 ENCOUNTER — Ambulatory Visit: Payer: Medicare Other | Admitting: Psychology

## 2020-02-17 ENCOUNTER — Ambulatory Visit (INDEPENDENT_AMBULATORY_CARE_PROVIDER_SITE_OTHER): Payer: Medicare Other | Admitting: Psychology

## 2020-02-17 DIAGNOSIS — F4321 Adjustment disorder with depressed mood: Secondary | ICD-10-CM

## 2020-02-20 ENCOUNTER — Other Ambulatory Visit: Payer: Self-pay

## 2020-02-20 ENCOUNTER — Ambulatory Visit
Admission: RE | Admit: 2020-02-20 | Discharge: 2020-02-20 | Disposition: A | Discharge status: Home / Self Care | Payer: Medicare Other | Source: Ambulatory Visit | Attending: Oncology | Admitting: Oncology

## 2020-02-20 DIAGNOSIS — Z171 Estrogen receptor negative status [ER-]: Secondary | ICD-10-CM

## 2020-02-20 DIAGNOSIS — R922 Inconclusive mammogram: Secondary | ICD-10-CM | POA: Diagnosis not present

## 2020-02-25 ENCOUNTER — Inpatient Hospital Stay: Payer: Medicare Other

## 2020-02-25 ENCOUNTER — Other Ambulatory Visit: Payer: Self-pay

## 2020-02-25 ENCOUNTER — Inpatient Hospital Stay: Payer: Medicare Other | Attending: Oncology | Admitting: Oncology

## 2020-02-25 ENCOUNTER — Ambulatory Visit: Payer: Medicare Other | Admitting: Psychology

## 2020-02-25 VITALS — BP 137/89 | HR 81 | Temp 97.8°F | Resp 18 | Ht 65.0 in | Wt 213.2 lb

## 2020-02-25 DIAGNOSIS — Z171 Estrogen receptor negative status [ER-]: Secondary | ICD-10-CM | POA: Diagnosis not present

## 2020-02-25 DIAGNOSIS — C50311 Malignant neoplasm of lower-inner quadrant of right female breast: Secondary | ICD-10-CM | POA: Diagnosis not present

## 2020-02-25 DIAGNOSIS — Z808 Family history of malignant neoplasm of other organs or systems: Secondary | ICD-10-CM | POA: Insufficient documentation

## 2020-02-25 DIAGNOSIS — Z9221 Personal history of antineoplastic chemotherapy: Secondary | ICD-10-CM | POA: Diagnosis not present

## 2020-02-25 DIAGNOSIS — Z800 Family history of malignant neoplasm of digestive organs: Secondary | ICD-10-CM | POA: Insufficient documentation

## 2020-02-25 DIAGNOSIS — Z923 Personal history of irradiation: Secondary | ICD-10-CM | POA: Insufficient documentation

## 2020-02-25 DIAGNOSIS — Z853 Personal history of malignant neoplasm of breast: Secondary | ICD-10-CM | POA: Diagnosis not present

## 2020-02-25 MED ORDER — NORTRIPTYLINE HCL 10 MG PO CAPS: ORAL_CAPSULE | ORAL | 5 refills | Status: DC

## 2020-02-25 MED ORDER — MELOXICAM 15 MG PO TABS: 15.0000 mg | ORAL_TABLET | Freq: Every day | ORAL | 5 refills | Status: DC

## 2020-02-25 NOTE — Progress Notes (Signed)
[facility]  [phone]) [phone] [phone]) [phone]     ID: Susan Wilson DOB: [birth year]  MR#: [id number]  YOV#:[id number]  Patient Care Team: Patient, No Pcp Per as PCP - General (General Practice) Rolm Bookbinder, MD as Consulting Physician (General Surgery) Takeira Yanes, Virgie Dad, MD as Consulting Physician (Oncology) [name], RN as Registered Nurse Rockwell Germany, RN as Registered Nurse Alda Berthold, DO as Consulting Physician (Neurology) OTHER MD:   CHIEF COMPLAINT: Triple negative breast cancer  CURRENT TREATMENT: Observation   INTERVAL HISTORY:  Susan Wilson returns today for follow up of her triple negative breast cancer. She continues under observation.  Since her last visit, she underwent bilateral diagnostic mammography with tomography at [facility] on 02/20/2020 showing: breast density category C; no evidence of malignancy in either breast.    REVIEW OF SYSTEMS: Susan Wilson still has multiple issues relating to her prior treatment.  Overall she is think she is doing "pretty good".  And especially now that her children are going to school in person.  However she is still having muscle spasms.  She still has burning sensations in her feet.  This comes and goes.  It is worse with activity.  It also happens sometimes when she is lying in bed.  Occasionally she drops things particularly when she is washing dishes.  She says her balance can be affected.  She still has a funny sense of smell and that affects her taste as well as far as work goes she is still working from home but she still has pain in her hands when she does a lot of activity.  She has not found a compression sleeve that she likes.  She has not yet been vaccinated although her husband was--she just does not feel comfortable with it she says.  A detailed review of systems today was otherwise stable   BREAST CANCER HISTORY: From the original intake note:  Susan Wilson herself noted  a mass in her right breast sometime around April. Initially she thought it might be related to menstruation, but as it did not change and eventually became tender, she brought it to her physician's attention. On 11/20/2014 patient underwent bilateral diagnostic mammography with tomosynthesis and right breast ultrasonography at the [facility]. The breast density was category B. There was a hyperdense mass in the right lower inner quadrant associated with skin thickening. There was also a 5 mm nodule posteriorly at the 8:30 o'clock position in the right breast. There were several hyperdense nodules in the right axilla. On physical exam there was a firm fixed mass in the right breast at the 5:00 position. By ultrasound this was lobulated and appear to involve the skin. It measures up to 4.1 cm. There was no sonographic correlation to the 5 mm nodule seen in a different area of the right breast. The right axilla showed 3 hypervascular lymph nodes with prominent cortical thickening, measuring less than 1.5 cm.  On 11/21/2014 the patient underwent right breast biopsy (5:00 mass) and biopsy of one of the suspicious right axillary lymph nodes. The pathology ([id number]) showed the breast biopsy to consist of invasive ductal carcinoma, grade 2, estrogen receptor and progesterone receptor negative, with an MIB-1 of 20%, and HER-2 equivocal, with the signals ratio of 1.41, but the average copy number per cell 4.35.  The patient's subsequent history is as detailed below   PAST MEDICAL HISTORY: Past Medical History:  Diagnosis Date  . Anemia   . Breast asymmetry 12/2015  .  Breast cancer ([facility]) 2017   right breast  . History of breast cancer 11/2014   right  . History of chemotherapy   . Migraines   . 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 Personal history of chemotherapy 2017  . Personal history of radiation therapy 2017    PAST SURGICAL HISTORY: Past Surgical History:    Procedure Laterality Date  . BREAST LUMPECTOMY Right 2017  . BREAST RECONSTRUCTION Right 06/23/2015   Procedure: ONCOPLASTIC RIGHT BREAST REDUCTION;  Surgeon: Irene Limbo, MD;  Location: [facility];  Service: Plastics;  Laterality: Right;  . BREAST REDUCTION SURGERY Left 06/23/2015   Procedure: MAMMARY REDUCTION  (BREAST) LEFT BREAST FOR SYMETRY (BILATERAL BREAST REDUCTION);  Surgeon: Irene Limbo, MD;  Location: [facility];  Service: Plastics;  Laterality: Left;  . BREAST REDUCTION SURGERY Left 01/15/2016   Procedure: MAMMARY REDUCTION  (BREAST) LEFT;  Surgeon: Irene Limbo, MD;  Location: [facility];  Service: Plastics;  Laterality: Left;  Marland Kitchen MASTOPEXY Left 01/15/2016   Procedure: POSSIBLE LEFT MASTOPEXY;  Surgeon: Irene Limbo, MD;  Location: [facility];  Service: Plastics;  Laterality: Left;  . PORT-A-CATH REMOVAL Right 01/15/2016   Procedure: REMOVAL OF RIGHT PORT-A-CATH;  Surgeon: Irene Limbo, MD;  Location: [facility];  Service: Plastics;  Laterality: Right;  . PORTACATH PLACEMENT Right 12/16/2014   Procedure: INSERTION PORT-A-CATH WITH ULTRASOUND;  Surgeon: Rolm Bookbinder, MD;  Location: [facility];  Service: General;  Laterality: Right;  . RADIOACTIVE SEED GUIDED PARTIAL MASTECTOMY WITH AXILLARY SENTINEL LYMPH NODE BIOPSY Right 06/15/2015   Procedure: RADIOACTIVE SEED GUIDED PARTIAL MASTECTOMY WITH AXILLARY SENTINEL LYMPH NODE BIOPSY;  Surgeon: Rolm Bookbinder, MD;  Location: [facility];  Service: General;  Laterality: Right;  . REDUCTION MAMMAPLASTY Bilateral 2017    FAMILY HISTORY Family History  Problem Relation Age of Onset  . Mesothelioma Maternal Grandfather        asbestos exposure, died in his 64s  . Heart defect Sister 0  . Breast cancer Paternal Grandmother   . Diabetes Paternal Grandmother   . Cancer Paternal Grandfather        NOS  . Colon cancer Other 32       MGMs  brother with colon cancer  . Cancer Other        several of MGF's sisters with cancer NOS  . Liver cancer Other        MGF's brother  . Heart disease Paternal Aunt   . Heart disease Paternal [name]    The patient's parents are living, her father being 46 and her mother 53 as of July 2016. The patient had no brothers. One sister died at age 6 from cardiac problems. The other sister is in good health. On the maternal side there is a history of colon cancer and an uncle age 72, liver cancer in a great uncle and mesothelioma in the maternal grandfather.   GYNECOLOGIC HISTORY:  No LMP recorded. Menarche age 63. The patient is GX P2. Her periods were interrupted with chemotherapy, but have resumed (August 2018), although now very scant and brief   SOCIAL HISTORY: (Updated 07/04/2018) Burna works at [employer]. She formerly worked in Therapist, art for [employer]. Her husband Antrell works for [employer]. The daughters are [place] and Lovie Macadamia, age 84 and 11 as of 06/2018. Arville Lime will be going to college for photography and videography. The patient attends a local [employer]en[employer]   ADVANCED DIRECTIVES: Not in place   HEALTH MAINTENANCE: Social History   Tobacco Use  . Smoking status: Never Smoker  . Smokeless tobacco: Never Used  Substance Use Topics  . Alcohol use: Yes    Comment: rarely  . Drug use: No     Colonoscopy: n/a (age)  PAP: 12/2018, HPV+, atypical cells / biopsy negative  Bone density: n/a (age)  Lipid panel:  No Known Allergies  Current Outpatient Medications  Medication Sig Dispense Refill  . b complex vitamins tablet Take 1 tablet by mouth daily.    . cyclobenzaprine (FLEXERIL) 10 MG tablet Take 1 tablet (10 mg total) by mouth 2 (two) times daily as needed for muscle spasms. 20 tablet 0  . ferrous sulfate 325 (65 FE) MG tablet Take 325 mg by mouth daily with breakfast.    . LORazepam (ATIVAN) 0.5 MG tablet Take 1 tablet (0.5 mg  total) by mouth at bedtime. 30 tablet 0  . meloxicam (MOBIC) 15 MG tablet Take 1 tablet (15 mg total) by mouth daily. 60 tablet 5  . nortriptyline (PAMELOR) 10 MG capsule Start nortriptyline 18m at bedtime for 2 week, then increase to 2 tablet at bedtime 60 capsule 5  . ondansetron (ZOFRAN) 4 MG tablet TAKE 1 TABLET BY MOUTH EVERY 8 HOURS AS NEEDED FOR NAUSEA OR VOMITING 30 tablet 0  . SUMAtriptan (IMITREX) 50 MG tablet Take 1 tablet (50 mg total) by mouth every 2 (two) hours as needed for migraine. Mayrepeat in 2 hrs if needed. 10 tablet 1  . vitamin E (VITAMIN E) 400 UNIT capsule Take 1 capsule (400 Units total) by mouth daily. 30 capsule 0   No current facility-administered medications for this visit.    OBJECTIVE: African-American woman who appears stated age V31   02/25/20 1158  BP: 137/89  Pulse: 81  Resp: 18  Temp: 97.8 F (36.6 C)  SpO2: 100%     Body mass index is 35.48 kg/m.     Sclerae unicteric, EOMs intact Wearing a mask No cervical or supraclavicular adenopathy Lungs no rales or rhonchi Heart regular rate and rhythm Abd soft, nontender, positive bowel sounds MSK no focal spinal tenderness, no upper extremity lymphedema Neuro: nonfocal, well oriented, appropriate affect Breasts: Status post bilateral reduction mammoplasty and also on the right status post lumpectomy and radiation.  There is no evidence of local recurrence.  Both axillae are benign.   LAB RESULTS:  CMP  CBC Latest Ref Rng & Units 07/16/2019 07/04/2018 02/01/2018  WBC 4.0 - 10.5 K/uL 5.6 6.3 5.9  Hemoglobin 12.0 - 15.0 g/dL 12.4 11.5(L) 11.8  Hematocrit 36 - 46 % 40.1 37.5 37.6  Platelets 150 - 400 K/uL 254 269 263   CMP Latest Ref Rng & Units 07/16/2019 07/04/2018 02/01/2018  Glucose 70 - 99 mg/dL 103(H) 95 87  BUN 6 - 20 mg/dL _0 Creatinine 0.44 - 1.00 mg/dL 0.74 0.79 0.77  Sodium 135 - 145 mmol/L 137 139 136  Potassium 3.5 - 5.1 mmol/L 4.1 3.7 4.2  Chloride 98 - 111 mmol/L 105 105 103   CO2 22 - 32 mmol/L _1 Calcium 8.9 - 10.3 mg/dL 8.7(L) 9.0 9.3  Total Protein 6.5 - 8.1 g/dL 7.9 7.8 7.8  Total Bilirubin 0.3 - 1.2 mg/dL 0.7 0.5 0.8  Alkaline Phos 38 - 126 U/L 64 74 74  AST 15 - 41 U/L 17 11(L) 11(L)  ALT 0 - 44 U/L 15 10 11  Urinalysis    Component Value Date/Time   LABSPEC 1.020 05/06/2009 1439   PHURINE 7.0 05/06/2009 1439   GLUCOSEU NEGATIVE 05/06/2009 1439   HGBUR NEGATIVE 05/06/2009 1439   BILIRUBINUR NEGATIVE 05/06/2009 1439   KETONESUR NEGATIVE 05/06/2009 1439   PROTEINUR NEGATIVE 05/06/2009 1439   UROBILINOGEN 0.2 05/06/2009 1439   NITRITE NEGATIVE 05/06/2009 1439   LEUKOCYTESUR  05/06/2009 1439    NEGATIVE Biochemical Testing Only. Please order routine urinalysis from main lab if confirmatory testing is needed.    STUDIES: MM DIAG BREAST TOMO BILATERAL  Result Date: 02/20/2020 CLINICAL DATA:  Status post RIGHT lumpectomy with radiation in 2017. Status post bilateral reduction mammoplasty 2017. EXAM: DIGITAL DIAGNOSTIC BILATERAL MAMMOGRAM WITH TOMO AND CAD COMPARISON:  Previous exam(s). ACR Breast Density Category c: The breast tissue is heterogeneously dense, which may obscure small masses. FINDINGS: Postoperative changes are identified in the breasts bilaterally. No suspicious mass, distortion, or microcalcifications are identified to suggest presence of malignancy. Mammographic images were processed with CAD. IMPRESSION: No mammographic evidence for malignancy. RECOMMENDATION: Diagnostic mammogram is suggested in 1 year. (Code:DM-B-01Y) I have discussed the findings and recommendations with the patient. If applicable, a reminder letter will be sent to the patient regarding the next appointment. BI-RADS CATEGORY  2: Benign. Electronically Signed   By: Nolon Nations M.D.   On: 02/20/2020 12:03     ASSESSMENT: 44 y.o. BRCA negative [place] woman s/p Right breast biopsy lower inner quadrant 11/21/2014 for a clinical T2 NX, stage 2  invasive ductal carcinoma, grade 2 or 3, estrogen and progesterone receptor negative, HER-2 equivocal, with an Mib-1 of 20%  (a) biopsy of a suspicious axillary lymph node same day was negative, but discordant  (b) review of 80 additional tumor cells by FISH still showed HER-2 not amplified; tumor should be treated as a triple negative  (1) neoadjuvant chemotherapy started 01/01/2015 consisting of cyclophosphamide and doxorubicin in dose dense fashion 4, completed 02/13/2015, followed by paclitaxel/ carboplatin weekly 12 started 02/26/2015, completed 05/14/2015.   (2) genetics testing 12/15/2014 through the Breast/Ovarian gene panel offered by [facility] found no deleterious mutations in ATM, BARD1, BRCA1, BRCA2, BRIP1, CDH1, CHEK2, EPCAM, FANCC, MLH1, MSH2, MSH6, NBN, PALB2, PMS2, PTEN, RAD51C, RAD51D, TP53, and XRCC2  (3) right lumpectomy and sentinel lymph node sampling 06/15/2015 showed a residual pT1b pN0 invasive ductal carcinoma, grade 1, with negative margins. (There were actually a few microscopic nests of residual tumor and 0.9 is the longest distance between 2 of the microscopic nests--there was not enough tissue for repeat prognostic panel.)  (a) bilateral reduction mammoplasty 06/23/2015 showed no malignancy in either breast  (4) adjuvant radiation 09/01/2015-10/19/2015:  Right breast / 45 Gray @ 1.8 Pearline Cables per fraction x 25 fractions Right breast boost / 16 Gray at [employer] per fraction x 8 fractions    PLAN: Susan Wilson is coming up on 5 years from her definitive surgery with no evidence of disease recurrence.  This is very favorable.  I gave her the option of "graduating" today, but she still does not feel entirely sure about the breast cancer recurrence concerns and she would like to be seen 1 more time she says.  Accordingly we made a return appointment for her in 1 year.  I reassured her that none of the symptoms she reports are related to cancer recurrence.  They are related to the  treatment she received.  I recommended regular exercise as the best possible treatment for these problems  Total encounter time 25 minutes.*  Nello Corro, Virgie Dad, MD  02/25/20 1:21 PM Medical Oncology and Hematology [facility] [place], [place] [address] Tel. [phone]    Fax. [phone]   I, Wilburn Mylar, am acting as scribe for Dr. Virgie Dad. Hind Chesler.  I, Lurline Del MD, have reviewed the above documentation for accuracy and completeness, and I agree with the above.   *Total Encounter Time as defined by the Centers for Medicare and Medicaid Services includes, in addition to the face-to-face time of a patient visit (documented in the note above) non-face-to-face time: obtaining and reviewing outside history, ordering and reviewing medications, tests or procedures, care coordination (communications with other health care professionals or caregivers) and documentation in the medical record.

## 2020-02-26 ENCOUNTER — Telehealth: Payer: Self-pay | Admitting: Oncology

## 2020-02-26 LAB — CANCER ANTIGEN 27.29: CA 27.29: 16.5 U/mL (ref 0.0–38.6)

## 2020-02-26 NOTE — Telephone Encounter (Signed)
Scheduled per 10/12 los. Called and spoke with pt, confirmed 10/17 appts

## 2020-04-07 ENCOUNTER — Ambulatory Visit (INDEPENDENT_AMBULATORY_CARE_PROVIDER_SITE_OTHER): Payer: Medicare Other | Admitting: Psychology

## 2020-04-07 DIAGNOSIS — F4321 Adjustment disorder with depressed mood: Secondary | ICD-10-CM | POA: Diagnosis not present

## 2020-05-21 ENCOUNTER — Ambulatory Visit (INDEPENDENT_AMBULATORY_CARE_PROVIDER_SITE_OTHER): Payer: Medicare Other | Admitting: Psychology

## 2020-05-21 DIAGNOSIS — F4321 Adjustment disorder with depressed mood: Secondary | ICD-10-CM | POA: Diagnosis not present

## 2020-05-28 DIAGNOSIS — C50311 Malignant neoplasm of lower-inner quadrant of right female breast: Secondary | ICD-10-CM | POA: Diagnosis not present

## 2020-06-11 ENCOUNTER — Other Ambulatory Visit: Payer: Self-pay

## 2020-06-11 ENCOUNTER — Encounter: Payer: Self-pay | Admitting: Rehabilitation

## 2020-06-11 ENCOUNTER — Ambulatory Visit: Payer: Medicare Other | Attending: General Surgery | Admitting: Rehabilitation

## 2020-06-11 DIAGNOSIS — G8929 Other chronic pain: Secondary | ICD-10-CM | POA: Diagnosis not present

## 2020-06-11 DIAGNOSIS — I890 Lymphedema, not elsewhere classified: Secondary | ICD-10-CM | POA: Insufficient documentation

## 2020-06-11 DIAGNOSIS — M25611 Stiffness of right shoulder, not elsewhere classified: Secondary | ICD-10-CM | POA: Insufficient documentation

## 2020-06-11 DIAGNOSIS — R293 Abnormal posture: Secondary | ICD-10-CM | POA: Insufficient documentation

## 2020-06-11 DIAGNOSIS — M25511 Pain in right shoulder: Secondary | ICD-10-CM | POA: Diagnosis not present

## 2020-06-11 NOTE — Patient Instructions (Signed)
Access Code: [email or web address]: [email or web address]: 01/27/2022Prepared by: Marcene Brawn TevisExercises  Doorway Pec Stretch at 90 Degrees Abduction - 1-2 x daily - 7 x weekly - 1 sets - 3 reps - 20-30 seconds hold  Standing Shoulder Internal Rotation Stretch with Towel - 1 x daily - 7 x weekly - 1-3 sets - 3 reps - 10 second hold  Supine Lower Trunk Rotation - 1 x daily - 7 x weekly - 1-3 sets - 3 reps - 20-30 seconds hold  Standing Shoulder Posterior Capsule Stretch - 1 x daily - 7 x weekly - 1-3 sets - 3 reps - 20-30 seconds hold

## 2020-06-11 NOTE — Therapy (Signed)
[place], [place], [address] Phone: [phone]   Fax:  [phone]  Physical Therapy Evaluation  Patient Details  Name: Susan Wilson MRN: [id number] Date of Birth: 1975-08-02 Referring Provider (PT): Dr. Donne Hazel   Encounter Date: 06/11/2020   PT End of Session - 06/11/20 1703    Visit Number 1    Number of Visits 8    Date for PT Re-Evaluation 08/06/20    PT Start Time 1604    PT Stop Time 1656    PT Time Calculation (min) 52 min    Activity Tolerance Patient tolerated treatment well    Behavior During Therapy [facility] for tasks assessed/performed           Past Medical History:  Diagnosis Date  . Anemia   . Breast asymmetry 12/2015  . Breast cancer ([place]) 2017   right breast  . History of breast cancer 11/2014   right  . History of chemotherapy   . Migraines   . 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 Personal history of chemotherapy 2017  . Personal history of radiation therapy 2017    Past Surgical History:  Procedure Laterality Date  . BREAST LUMPECTOMY Right 2017  . BREAST RECONSTRUCTION Right 06/23/2015   Procedure: ONCOPLASTIC RIGHT BREAST REDUCTION;  Surgeon: Irene Limbo, MD;  Location: [place];  Service: Plastics;  Laterality: Right;  . BREAST REDUCTION SURGERY Left 06/23/2015   Procedure: MAMMARY REDUCTION  (BREAST) LEFT BREAST FOR SYMETRY (BILATERAL BREAST REDUCTION);  Surgeon: Irene Limbo, MD;  Location: [place];  Service: Plastics;  Laterality: Left;  . BREAST REDUCTION SURGERY Left 01/15/2016   Procedure: MAMMARY REDUCTION  (BREAST) LEFT;  Surgeon: Irene Limbo, MD;  Location: [place];  Service: Plastics;  Laterality: Left;  Marland Kitchen MASTOPEXY Left 01/15/2016   Procedure: POSSIBLE LEFT MASTOPEXY;  Surgeon: Irene Limbo, MD;  Location: [place];  Service: Plastics;   Laterality: Left;  . PORT-A-CATH REMOVAL Right 01/15/2016   Procedure: REMOVAL OF RIGHT PORT-A-CATH;  Surgeon: Irene Limbo, MD;  Location: [place];  Service: Plastics;  Laterality: Right;  . PORTACATH PLACEMENT Right 12/16/2014   Procedure: INSERTION PORT-A-CATH WITH ULTRASOUND;  Surgeon: Rolm Bookbinder, MD;  Location: [facility];  Service: General;  Laterality: Right;  . RADIOACTIVE SEED GUIDED PARTIAL MASTECTOMY WITH AXILLARY SENTINEL LYMPH NODE BIOPSY Right 06/15/2015   Procedure: RADIOACTIVE SEED GUIDED PARTIAL MASTECTOMY WITH AXILLARY SENTINEL LYMPH NODE BIOPSY;  Surgeon: Rolm Bookbinder, MD;  Location: [place];  Service: General;  Laterality: Right;  . REDUCTION MAMMAPLASTY Bilateral 2017    There were no vitals filed for this visit.    Subjective Assessment - 06/11/20 1603    Subjective I'm still having alot of pain in the Rt shoulder.  It still swells alot.  i use the machine 1x per week and my compressions sleeves every day. I did some therapy on the other side but more back and neck.    Pertinent History history of Rt lumpectomy in 2017 with 0/6 lymph nodes positive.  Completed radiation. Another left sided breast reduction coming up with Dr. Iran Planas.    Limitations Lifting   reaching backwards, doing hair and washing the back   Patient Stated Goals see if there is anything else that will help    Currently in Pain? Yes    Pain Score 5     Pain  Location Shoulder    Pain Orientation Right    Pain Descriptors / Indicators Throbbing;Aching;Sharp    Pain Type Chronic pain    Pain Radiating Towards down the arm and up towards the neck and back then I take a muscle relaxer    Pain Onset More than a month ago    Pain Frequency Constant    Aggravating Factors  reaching, doing hair, sweeping, laying on that side    Pain Relieving Factors muscle relaxers, compression garments, or self bandaging, compression pump, arnica gel, biofreeze               [facility] PT Assessment - 06/11/20 0001      Assessment   Medical Diagnosis Rt breast cancer    Referring Provider (PT) Dr. Donne Hazel    Onset Date/Surgical Date 05/17/15    Hand Dominance Right    Prior Therapy yes      Precautions   Precaution Comments lymphedema Rt UE      Restrictions   Weight Bearing Restrictions No      Balance Screen   Has the patient fallen in the past 6 months No    Has the patient had a decrease in activity level because of a fear of falling?  No    Is the patient reluctant to leave their home because of a fear of falling?  No      Home Ecologist residence    Living Arrangements Spouse/significant other    Available Help at Discharge Family    Type of [place]      Prior Function   Level of Independence Independent    Vocation Part time employment    Vocation Requirements typing    Leisure walking as able in the mall      Cognition   Overall Cognitive Status Within Functional Limits for tasks assessed      Observation/Other Assessments   Observations Rt shoulder a bit higher and noticeably larger in supine      Posture/Postural Control   Posture/Postural Control Postural limitations    Postural Limitations Rounded Shoulders;Forward head      ROM / Strength   AROM / PROM / Strength AROM;Strength      AROM   AROM Assessment Site Shoulder    Right/Left Shoulder Right;Left    Right Shoulder Extension 32 Degrees   pain posterior shoulder   Right Shoulder Flexion 156 Degrees   pull in the Rt lat muscle   Right Shoulder ABduction 160 Degrees   back of shoulder pain   Right Shoulder Internal Rotation 70 Degrees   behind the back Rt: T12 with pain Lt: T5   Right Shoulder External Rotation 100 Degrees    Right Shoulder Horizontal ABduction 10 Degrees   top of shoulder pain with finger tingle     Strength   Strength Assessment Site Shoulder    Right/Left Shoulder Right;Left    Right Shoulder Flexion 4-/5    pn top of shoulder   Right Shoulder Extension 4+/5    Right Shoulder ABduction 4/5    Right Shoulder Internal Rotation 4/5   pain anterior shoulder more pectoralis   Right Shoulder External Rotation 4/5    Left Shoulder Flexion 4/5    Left Shoulder Extension 4+/5    Left Shoulder ABduction 4/5    Left Shoulder Internal Rotation 4/5    Left Shoulder External Rotation 4/5      Palpation   Palpation comment Rt upper  quadrant/shoulder overall more firm then left possibly due to lymphedema, increased tightness Rt latissimus, pectoralis and axillary borders in supine             LYMPHEDEMA/ONCOLOGY QUESTIONNAIRE - 06/11/20 0001      Type   Cancer Type right breast      Surgeries   Lumpectomy Date 06/15/15    Other Surgery Date 01/15/16    Number Lymph Nodes Removed 7      Treatment   Past Chemotherapy Treatment Yes    Past Radiation Treatment Yes      What other symptoms do you have   Are you Having Heaviness or Tightness Yes    Are you having Pain Yes    Are you having pitting edema No      Lymphedema Assessments   Lymphedema Assessments Upper extremities      Right Upper Extremity Lymphedema   At Axilla  38.5 cm    15 cm Proximal to Olecranon Process 39.9 cm    10 cm Proximal to Olecranon Process 37.9 cm    Olecranon Process 28.5 cm    15 cm Proximal to Ulnar Styloid Process 29.2 cm    10 cm Proximal to Ulnar Styloid Process 26.2 cm    Just Proximal to Ulnar Styloid Process 18.1 cm    Across Hand at [employer] 20 cm    At [place] of 2nd Digit 6.5 cm                 Quick Dash - 06/11/20 0001    Open a tight or new jar Severe difficulty    Do heavy household chores (wash walls, wash floors) Severe difficulty    Carry a shopping bag or briefcase Moderate difficulty    Wash your back Severe difficulty    Use a knife to cut food Moderate difficulty    Recreational activities in which you take some force or impact through your arm, shoulder, or hand (golf,  hammering, tennis) Severe difficulty    During the past week, to what extent has your arm, shoulder or hand problem interfered with your normal social activities with family, friends, neighbors, or groups? Quite a bit    During the past week, to what extent has your arm, shoulder or hand problem limited your work or other regular daily activities [place]    Arm, shoulder, or hand pain. Severe    Tingling (pins and needles) in your arm, shoulder, or hand Moderate    Difficulty Sleeping Moderate difficulty    DASH Score 63.64 %            Objective measurements completed on examination: See above findings.       [facility] Adult PT Treatment/Exercise - 06/11/20 0001      Exercises   Exercises Other Exercises    Other Exercises  gave pt handout on current stretches to focus on: supine T arms with LTR, doorway stretch, IR towel stretch, and posterior shoulder stretch per instruction section                  PT Education - 06/11/20 1703    Education Details current HEP, POC    Person(s) Educated Patient    Methods Explanation;Demonstration;Verbal cues;Handout    Comprehension Verbalized understanding;Need further instruction;Verbal cues required               PT Long Term Goals - 06/11/20 1712      PT LONG TERM GOAL #1  Title Pt will report ease of reaching in the back seat of the car improved by at least 50%    Baseline unable to reach to son    Time 8    Period Weeks    Status New      PT LONG TERM GOAL #2   Title Pt will be able to do hair without increased pain over 2/10 in the Rt shoulder    Baseline 5-6/10    Time 8    Period Weeks    Status New      PT LONG TERM GOAL #3   Title Pt will demonstrate correct use of compression garments and lymphedema pump to manage chronic lymphedema    Time 8    Period Weeks    Status New      PT LONG TERM GOAL #4   Title Pt will decrese QDASH to less than 53%    Baseline 64%    Time 8    Period Weeks     Status New      PT LONG TERM GOAL #5   Title NA    Baseline NA                  Plan - 06/11/20 1704    Clinical Impression Statement pt returns to PT here with continued Rt shoulder pain and tightness limiting AROM and mobility.  Pt demonstrates continued lymphedema in the Rt UE which is smaller in the forearm but unchanged or larger in the proximal arm with edema noted in the Rt shoulder area and intermittently in the Rt breast.  Pt currently has a flexitouch entre model and may benefit from upgrade to a chest piece due to edema here.  The [facility] joint and rotator cuff seem to not be pain contributors, good scapulohumeral rhythm, but limitations into shoulder extension, IR, horizontal abduction and tightness in the pectoralis and latissimus as well as chronic lymphedema seem to be contributing to soft tissue pain abnormal shoulder positioning.  pt would like to see if MT, TE, and PT allow her to decrease some of the pain before the next breast reduction coming up soon.    Personal Factors and Comorbidities Comorbidity 2;Past/Current Experience;Time since onset of injury/illness/exacerbation    Comorbidities lymphedema, radiation history    Examination-Activity Limitations Lift;Carry    Examination-Participation Restrictions Cleaning    Stability/Clinical Decision Making Stable/Uncomplicated    Clinical Decision Making Low    Rehab Potential Good    PT Frequency 1x / week   due to schedule   PT Duration 8 weeks    PT Treatment/Interventions ADLs/Self Care Home Management;Therapeutic exercise;Patient/family education;Manual techniques;Manual lymph drainage;Taping;Passive range of motion;Dry needling    PT Next Visit Plan hear back from derek regarding pump upgrade?, review stretches, bring garments to check?, STM Rt lat/pectoralis/upper quadrant with PROM and joint mob to improve shoulder extension, postural TE    PT Home Exercise Plan [email or web address] HEP code    Recommended Other Services pump  upgrade    Consulted and Agree with Plan of Care Patient           Patient will benefit from skilled therapeutic intervention in order to improve the following deficits and impairments:  Pain,Postural dysfunction,Decreased range of motion,Impaired UE functional use,Increased edema  Visit Diagnosis: Lymphedema  Stiffness of right shoulder, not elsewhere classified  Chronic right shoulder pain  Abnormal posture     Problem List Patient Active Problem List   Diagnosis Date Noted  . Atypical  glandular cells on cervical Pap smear on 12/31/18 02/22/2019  . Port catheter in place 12/28/2015  . Chemotherapy induced neutropenia ([place]) 05/14/2015  . 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 Tooth ache 01/09/2015  . Genetic testing 12/26/2014  . Family history of breast cancer   . Malignant neoplasm of lower-inner quadrant of right breast of female, estrogen receptor negative ([place]) 11/25/2014  . Migraine 11/19/2014    Stark Bray 06/11/2020, 5:17 PM  [place], [place], [address] Phone: [phone]   Fax:  [phone]  Name: PHILAMENA KRAMAR MRN: [id number] Date of Birth: 10-22-1975

## 2020-06-23 ENCOUNTER — Other Ambulatory Visit: Payer: Self-pay

## 2020-06-23 ENCOUNTER — Encounter: Payer: Self-pay | Admitting: Rehabilitation

## 2020-06-23 ENCOUNTER — Ambulatory Visit: Payer: Medicare Other | Admitting: Psychology

## 2020-06-23 ENCOUNTER — Ambulatory Visit: Payer: Medicare Other | Attending: General Surgery | Admitting: Rehabilitation

## 2020-06-23 DIAGNOSIS — M25611 Stiffness of right shoulder, not elsewhere classified: Secondary | ICD-10-CM

## 2020-06-23 DIAGNOSIS — I890 Lymphedema, not elsewhere classified: Secondary | ICD-10-CM | POA: Diagnosis not present

## 2020-06-23 DIAGNOSIS — R293 Abnormal posture: Secondary | ICD-10-CM | POA: Diagnosis not present

## 2020-06-23 DIAGNOSIS — M25511 Pain in right shoulder: Secondary | ICD-10-CM | POA: Diagnosis not present

## 2020-06-23 DIAGNOSIS — G8929 Other chronic pain: Secondary | ICD-10-CM

## 2020-06-23 NOTE — Therapy (Signed)
[place], [place], [address] Phone: [phone]   Fax:  [phone]  Physical Therapy Treatment  Patient Details  Name: Susan Wilson MRN: [id number] Date of Birth: 10-Nov-1975 Referring Provider (PT): Dr. Donne Hazel   Encounter Date: 06/23/2020   PT End of Session - 06/23/20 1449    Visit Number 2    Number of Visits 8    Date for PT Re-Evaluation 08/06/20    PT Start Time 6754    PT Stop Time 1449    PT Time Calculation (min) 46 min    Activity Tolerance Patient tolerated treatment well    Behavior During Therapy [facility] for tasks assessed/performed           Past Medical History:  Diagnosis Date  . Anemia   . Breast asymmetry 12/2015  . Breast cancer ([place]) 2017   right breast  . History of breast cancer 11/2014   right  . History of chemotherapy   . Migraines   . 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 Personal history of chemotherapy 2017  . Personal history of radiation therapy 2017    Past Surgical History:  Procedure Laterality Date  . BREAST LUMPECTOMY Right 2017  . BREAST RECONSTRUCTION Right 06/23/2015   Procedure: ONCOPLASTIC RIGHT BREAST REDUCTION;  Surgeon: Irene Limbo, MD;  Location: [place];  Service: Plastics;  Laterality: Right;  . BREAST REDUCTION SURGERY Left 06/23/2015   Procedure: MAMMARY REDUCTION  (BREAST) LEFT BREAST FOR SYMETRY (BILATERAL BREAST REDUCTION);  Surgeon: Irene Limbo, MD;  Location: [place];  Service: Plastics;  Laterality: Left;  . BREAST REDUCTION SURGERY Left 01/15/2016   Procedure: MAMMARY REDUCTION  (BREAST) LEFT;  Surgeon: Irene Limbo, MD;  Location: [place];  Service: Plastics;  Laterality: Left;  Marland Kitchen MASTOPEXY Left 01/15/2016   Procedure: POSSIBLE LEFT MASTOPEXY;  Surgeon: Irene Limbo, MD;  Location: [place];  Service: Plastics;   Laterality: Left;  . PORT-A-CATH REMOVAL Right 01/15/2016   Procedure: REMOVAL OF RIGHT PORT-A-CATH;  Surgeon: Irene Limbo, MD;  Location: [place];  Service: Plastics;  Laterality: Right;  . PORTACATH PLACEMENT Right 12/16/2014   Procedure: INSERTION PORT-A-CATH WITH ULTRASOUND;  Surgeon: Rolm Bookbinder, MD;  Location: [place];  Service: General;  Laterality: Right;  . RADIOACTIVE SEED GUIDED PARTIAL MASTECTOMY WITH AXILLARY SENTINEL LYMPH NODE BIOPSY Right 06/15/2015   Procedure: RADIOACTIVE SEED GUIDED PARTIAL MASTECTOMY WITH AXILLARY SENTINEL LYMPH NODE BIOPSY;  Surgeon: Rolm Bookbinder, MD;  Location: [facility];  Service: General;  Laterality: Right;  . REDUCTION MAMMAPLASTY Bilateral 2017    There were no vitals filed for this visit.   Subjective Assessment - 06/23/20 1400    Subjective Nothing new.    Pertinent History history of Rt lumpectomy in 2017 with 0/6 lymph nodes positive.  Completed radiation. Another left sided breast reduction coming up with Dr. Iran Planas.    Currently in Pain? Yes    Pain Location Shoulder    Pain Orientation Right    Pain Descriptors / Indicators Aching    Pain Type Chronic pain    Pain Onset More than a month ago    Pain Frequency Constant                             [facility] Adult PT Treatment/Exercise - 06/23/20 0001  Exercises   Exercises Shoulder      Shoulder Exercises: Supine   Flexion 5 reps;Right    Flexion Limitations punch    Other Supine Exercises extension press 6" x 6      Shoulder Exercises: Sidelying   External Rotation Right;5 reps    ABduction 5 reps;Right      Manual Therapy   Manual Therapy Edema management;Passive ROM;Soft tissue mobilization;Taping    Edema Management checked garment which fits well.    Soft tissue mobilization to the Rt pectoralis, anterior shoulder, UT, deltoid, and latisssimus in supine and lat and posterior shoulder in sidelying    Passive  ROM to the Rt shoulder into flexion and abduction, and horizontal abduction    Kinesiotex Facilitate Muscle      Kinesiotix   Facilitate Muscle  one 1 strip anterior shoulder towards scapula 50% with retraction held applied after skinkote and education on removal.  pt has used KT before and is aware of precautions and removal                       PT Long Term Goals - 06/11/20 1712      PT LONG TERM GOAL #1   Title Pt will report ease of reaching in the back seat of the car improved by at least 50%    Baseline unable to reach to son    Time 8    Period Weeks    Status New      PT LONG TERM GOAL #2   Title Pt will be able to do hair without increased pain over 2/10 in the Rt shoulder    Baseline 5-6/10    Time 8    Period Weeks    Status New      PT LONG TERM GOAL #3   Title Pt will demonstrate correct use of compression garments and lymphedema pump to manage chronic lymphedema    Time 8    Period Weeks    Status New      PT LONG TERM GOAL #4   Title Pt will decrese QDASH to less than 53%    Baseline 64%    Time 8    Period Weeks    Status New      PT LONG TERM GOAL #5   Title NA    Baseline NA                 Plan - 06/23/20 1449    Clinical Impression Statement First sessions of Rt shoulder STM to improve tightness and resting pain incorporating PROM and AROM.  Overall no sig ttp but overall tension in the upper quadrant and upper arm due to lymphedema.  Pt tolerated all treatment well and reports feeling looser post MT.    PT Frequency 1x / week    PT Duration 8 weeks    PT Treatment/Interventions ADLs/Self Care Home Management;Therapeutic exercise;Patient/family education;Manual techniques;Manual lymph drainage;Taping;Passive range of motion;Dry needling    PT Next Visit Plan hear back from derek regarding pump upgrade?, review stretches,STM Rt lat/pectoralis/upper quadrant with PROM and joint mob to improve shoulder extension, postural TE     Consulted and Agree with Plan of Care Patient           Patient will benefit from skilled therapeutic intervention in order to improve the following deficits and impairments:     Visit Diagnosis: Lymphedema  Stiffness of right shoulder, not elsewhere classified  Chronic right shoulder pain  Abnormal posture  Lymphedema, not elsewhere classified     Problem List Patient Active Problem List   Diagnosis Date Noted  . Atypical glandular cells on cervical Pap smear on 12/31/18 02/22/2019  . Port catheter in place 12/28/2015  . Chemotherapy induced neutropenia ([place]) 05/14/2015  . 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 Tooth ache 01/09/2015  . Genetic testing 12/26/2014  . Family history of breast cancer   . Malignant neoplasm of lower-inner quadrant of right breast of female, estrogen receptor negative ([place]) 11/25/2014  . Migraine 11/19/2014    Stark Bray 06/23/2020, 2:51 PM  [place], [place], [address] Phone: [phone]   Fax:  [phone]  Name: Susan Wilson MRN: [id number] Date of Birth: 1975-11-14

## 2020-06-30 ENCOUNTER — Ambulatory Visit: Payer: Medicare Other | Admitting: Rehabilitation

## 2020-07-08 DIAGNOSIS — N6481 Ptosis of breast: Secondary | ICD-10-CM | POA: Diagnosis not present

## 2020-07-08 DIAGNOSIS — Z853 Personal history of malignant neoplasm of breast: Secondary | ICD-10-CM | POA: Diagnosis not present

## 2020-07-08 DIAGNOSIS — N6489 Other specified disorders of breast: Secondary | ICD-10-CM | POA: Diagnosis not present

## 2020-07-08 DIAGNOSIS — Z923 Personal history of irradiation: Secondary | ICD-10-CM | POA: Diagnosis not present

## 2020-07-09 ENCOUNTER — Ambulatory Visit: Payer: Medicare Other | Admitting: Rehabilitation

## 2020-07-14 ENCOUNTER — Ambulatory Visit: Payer: Medicare Other | Attending: General Surgery | Admitting: Rehabilitation

## 2020-07-14 DIAGNOSIS — I890 Lymphedema, not elsewhere classified: Secondary | ICD-10-CM | POA: Insufficient documentation

## 2020-07-14 DIAGNOSIS — G8929 Other chronic pain: Secondary | ICD-10-CM | POA: Insufficient documentation

## 2020-07-14 DIAGNOSIS — R293 Abnormal posture: Secondary | ICD-10-CM | POA: Insufficient documentation

## 2020-07-14 DIAGNOSIS — M25511 Pain in right shoulder: Secondary | ICD-10-CM | POA: Insufficient documentation

## 2020-07-14 DIAGNOSIS — M25611 Stiffness of right shoulder, not elsewhere classified: Secondary | ICD-10-CM | POA: Insufficient documentation

## 2020-07-23 ENCOUNTER — Encounter: Payer: Medicare Other | Admitting: Rehabilitation

## 2020-07-28 ENCOUNTER — Ambulatory Visit: Payer: Medicare Other

## 2020-08-11 ENCOUNTER — Encounter: Payer: Self-pay | Admitting: Rehabilitation

## 2020-08-11 ENCOUNTER — Other Ambulatory Visit: Payer: Self-pay

## 2020-08-11 ENCOUNTER — Ambulatory Visit: Payer: Medicare Other | Admitting: Rehabilitation

## 2020-08-11 DIAGNOSIS — I890 Lymphedema, not elsewhere classified: Secondary | ICD-10-CM

## 2020-08-11 DIAGNOSIS — M25611 Stiffness of right shoulder, not elsewhere classified: Secondary | ICD-10-CM

## 2020-08-11 DIAGNOSIS — M25511 Pain in right shoulder: Secondary | ICD-10-CM | POA: Diagnosis not present

## 2020-08-11 DIAGNOSIS — G8929 Other chronic pain: Secondary | ICD-10-CM | POA: Diagnosis not present

## 2020-08-11 DIAGNOSIS — R293 Abnormal posture: Secondary | ICD-10-CM | POA: Diagnosis not present

## 2020-08-11 NOTE — Therapy (Signed)
[place], [place], [address] Phone: [phone]   Fax:  [phone]  Physical Therapy Treatment  Patient Details  Name: Susan Wilson MRN: [id number] Date of Birth: 1975-06-21 Referring Provider (PT): Dr. Donne Hazel   Encounter Date: 08/11/2020   PT End of Session - 08/11/20 1705    Visit Number 3    Number of Visits 8    Date for PT Re-Evaluation 09/15/20    Authorization Type medicare    Progress Note Due on Visit 10    PT Start Time 1604    PT Stop Time 1658    PT Time Calculation (min) 54 min    Activity Tolerance Patient tolerated treatment well    Behavior During Therapy [facility] for tasks assessed/performed           Past Medical History:  Diagnosis Date  . Anemia   . Breast asymmetry 12/2015  . Breast cancer ([place]) 2017   right breast  . History of breast cancer 11/2014   right  . History of chemotherapy   . Migraines   . 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 Personal history of chemotherapy 2017  . Personal history of radiation therapy 2017    Past Surgical History:  Procedure Laterality Date  . BREAST LUMPECTOMY Right 2017  . BREAST RECONSTRUCTION Right 06/23/2015   Procedure: ONCOPLASTIC RIGHT BREAST REDUCTION;  Surgeon: Irene Limbo, MD;  Location: [place];  Service: Plastics;  Laterality: Right;  . BREAST REDUCTION SURGERY Left 06/23/2015   Procedure: MAMMARY REDUCTION  (BREAST) LEFT BREAST FOR SYMETRY (BILATERAL BREAST REDUCTION);  Surgeon: Irene Limbo, MD;  Location: [place];  Service: Plastics;  Laterality: Left;  . BREAST REDUCTION SURGERY Left 01/15/2016   Procedure: MAMMARY REDUCTION  (BREAST) LEFT;  Surgeon: Irene Limbo, MD;  Location: [place];  Service: Plastics;  Laterality: Left;  Marland Kitchen MASTOPEXY Left 01/15/2016   Procedure: POSSIBLE LEFT MASTOPEXY;  Surgeon: Irene Limbo,  MD;  Location: [place];  Service: Plastics;  Laterality: Left;  . PORT-A-CATH REMOVAL Right 01/15/2016   Procedure: REMOVAL OF RIGHT PORT-A-CATH;  Surgeon: Irene Limbo, MD;  Location: [place];  Service: Plastics;  Laterality: Right;  . PORTACATH PLACEMENT Right 12/16/2014   Procedure: INSERTION PORT-A-CATH WITH ULTRASOUND;  Surgeon: Rolm Bookbinder, MD;  Location: [place];  Service: General;  Laterality: Right;  . RADIOACTIVE SEED GUIDED PARTIAL MASTECTOMY WITH AXILLARY SENTINEL LYMPH NODE BIOPSY Right 06/15/2015   Procedure: RADIOACTIVE SEED GUIDED PARTIAL MASTECTOMY WITH AXILLARY SENTINEL LYMPH NODE BIOPSY;  Surgeon: Rolm Bookbinder, MD;  Location: [place];  Service: General;  Laterality: Right;  . REDUCTION MAMMAPLASTY Bilateral 2017    There were no vitals filed for this visit.   Subjective Assessment - 08/11/20 1605    Subjective Last week they put me on short term disability so I can just get medical stuff done.  Thats why I am here again. Will follow up after therapy for the reduction.  The right side has been really tight.    Pertinent History history of Rt lumpectomy in 2017 with 0/6 lymph nodes positive.  Completed radiation. Another left sided breast reduction coming up with Dr. Iran Planas.    Currently in Pain? Yes    Pain Score 3     Pain Location Shoulder    Pain Orientation Right    Pain Descriptors / Indicators Aching  Pain Type Chronic pain    Pain Onset More than a month ago    Pain Frequency Constant              [facility] PT Assessment - 08/11/20 0001      AROM   Right Shoulder Flexion 156 Degrees    Right Shoulder ABduction 160 Degrees                         [facility] Adult PT Treatment/Exercise - 08/11/20 0001      Manual Therapy   Manual therapy comments remeasured AROM and assessed goals due to POC expired.  Added date extension    Soft tissue mobilization to the Rt pectoralis, anterior  shoulder, UT, deltoid, and latisssimus in supine and lat and posterior shoulder in sidelying    Passive ROM to the Rt shoulder into flexion and abduction, and horizontal abduction                       PT Long Term Goals - 08/11/20 1709      PT LONG TERM GOAL #1   Title Pt will report ease of reaching in the back seat of the car improved by at least 50%    Baseline unable to reach to son    Time 5    Period Weeks    Status On-going      PT LONG TERM GOAL #2   Title Pt will be able to do hair without increased pain over 2/10 in the Rt shoulder    Time 5    Period Weeks    Status On-going      PT LONG TERM GOAL #3   Title Pt will demonstrate correct use of compression garments and lymphedema pump to manage chronic lymphedema    Baseline has garments but still trying to upgrade pump    Status On-going      PT LONG TERM GOAL #4   Title Pt will decrese QDASH to less than 53%    Status On-going                 Plan - 08/11/20 1706    Clinical Impression Statement pt returns now reporting being put on work short term disability so she will be able to make appointments.  Pt was told to schedule only one at a time per the front desk due to no shows.  No change in status overall since last visit so proceeded with Rt shoulder PROM, MFR, and STM to loosen up Rt upper quadrant.  Heard back from pump this afternoon who stated she has a financial hold on her account and this will need to be resolved before moving forward with pump upgrade.    PT Frequency 1x / week    PT Duration 8 weeks    PT Treatment/Interventions ADLs/Self Care Home Management;Therapeutic exercise;Patient/family education;Manual techniques;Manual lymph drainage;Taping;Passive range of motion;Dry needling    PT Next Visit Plan hear back from derek regarding pump upgrade?, review stretches,STM Rt lat/pectoralis/upper quadrant with PROM and joint mob to improve shoulder extension, postural TE    Consulted  and Agree with Plan of Care Patient           Patient will benefit from skilled therapeutic intervention in order to improve the following deficits and impairments:     Visit Diagnosis: Lymphedema  Stiffness of right shoulder, not elsewhere classified  Chronic right shoulder pain  Abnormal posture  Problem List Patient Active Problem List   Diagnosis Date Noted  . Atypical glandular cells on cervical Pap smear on 12/31/18 02/22/2019  . Port catheter in place 12/28/2015  . Chemotherapy induced neutropenia ([place]) 05/14/2015  . Chemotherapy-induced neuropathy ([place]) 04/08/2015  . 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 Tooth ache 01/09/2015  . Genetic testing 12/26/2014  . Family history of breast cancer   . Malignant neoplasm of lower-inner quadrant of right breast of female, estrogen receptor negative ([place]) 11/25/2014  . Migraine 11/19/2014    Stark Bray 08/11/2020, 5:10 PM  [place], [place], [address] Phone: [phone]   Fax:  [phone]  Name: Susan Wilson MRN: [id number] Date of Birth: 08-23-1975

## 2020-08-13 ENCOUNTER — Other Ambulatory Visit: Payer: Self-pay

## 2020-08-13 ENCOUNTER — Ambulatory Visit: Payer: Medicare Other

## 2020-08-13 DIAGNOSIS — M25511 Pain in right shoulder: Secondary | ICD-10-CM | POA: Diagnosis not present

## 2020-08-13 DIAGNOSIS — G8929 Other chronic pain: Secondary | ICD-10-CM | POA: Diagnosis not present

## 2020-08-13 DIAGNOSIS — M25611 Stiffness of right shoulder, not elsewhere classified: Secondary | ICD-10-CM

## 2020-08-13 DIAGNOSIS — R293 Abnormal posture: Secondary | ICD-10-CM

## 2020-08-13 DIAGNOSIS — I890 Lymphedema, not elsewhere classified: Secondary | ICD-10-CM | POA: Diagnosis not present

## 2020-08-13 NOTE — Patient Instructions (Signed)
3 Way Raises:      Starting Position:  Leaning against wall, walk feet a few inches away from the wall and make tummy tight (tuck hips underneath you) Press back/shoulders/head against wall as much as possible. Keep thumbs up to ceiling, elbows straight and shoulders relaxed/down throughout.  1. Lift arms in front to shoulder height 2. Lift arms a little wider into a "V" to shoulder height 3. Lift arms out to sides in a "T" to shoulder height  Perform 10 times in each direction. Hold 1-2 lbs to start with and work up to 2-3 sets of 10/day. Perform 3-4 times/week. Increase weight as able, decreasing sets of 10 each time you increase weights, then slowly working your way back up to 2-3 sets each time.    Cancer Rehab [phone]

## 2020-08-13 NOTE — Therapy (Signed)
[place], [place], [address] Phone: [phone]   Fax:  [phone]  Physical Therapy Treatment  Patient Details  Name: Susan Wilson MRN: [id number] Date of Birth: June 14, 1975 Referring Provider (PT): Dr. Donne Hazel   Encounter Date: 08/13/2020   PT End of Session - 08/13/20 0846    Visit Number 4    Number of Visits 8    Date for PT Re-Evaluation 09/15/20    Authorization Type medicare    PT Start Time 0807   pt arrived late   PT Stop Time 0902    PT Time Calculation (min) 55 min    Activity Tolerance Patient tolerated treatment well    Behavior During Therapy [facility] for tasks assessed/performed           Past Medical History:  Diagnosis Date  . Anemia   . Breast asymmetry 12/2015  . Breast cancer ([place]) 2017   right breast  . History of breast cancer 11/2014   right  . History of chemotherapy   . Migraines   . 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 Personal history of chemotherapy 2017  . Personal history of radiation therapy 2017    Past Surgical History:  Procedure Laterality Date  . BREAST LUMPECTOMY Right 2017  . BREAST RECONSTRUCTION Right 06/23/2015   Procedure: ONCOPLASTIC RIGHT BREAST REDUCTION;  Surgeon: Irene Limbo, MD;  Location: [place];  Service: Plastics;  Laterality: Right;  . BREAST REDUCTION SURGERY Left 06/23/2015   Procedure: MAMMARY REDUCTION  (BREAST) LEFT BREAST FOR SYMETRY (BILATERAL BREAST REDUCTION);  Surgeon: Irene Limbo, MD;  Location: [place];  Service: Plastics;  Laterality: Left;  . BREAST REDUCTION SURGERY Left 01/15/2016   Procedure: MAMMARY REDUCTION  (BREAST) LEFT;  Surgeon: Irene Limbo, MD;  Location: [place];  Service: Plastics;  Laterality: Left;  Marland Kitchen MASTOPEXY Left 01/15/2016   Procedure: POSSIBLE LEFT MASTOPEXY;  Surgeon: Irene Limbo, MD;  Location:  [place];  Service: Plastics;  Laterality: Left;  . PORT-A-CATH REMOVAL Right 01/15/2016   Procedure: REMOVAL OF RIGHT PORT-A-CATH;  Surgeon: Irene Limbo, MD;  Location: [place];  Service: Plastics;  Laterality: Right;  . PORTACATH PLACEMENT Right 12/16/2014   Procedure: INSERTION PORT-A-CATH WITH ULTRASOUND;  Surgeon: Rolm Bookbinder, MD;  Location: [place];  Service: General;  Laterality: Right;  . RADIOACTIVE SEED GUIDED PARTIAL MASTECTOMY WITH AXILLARY SENTINEL LYMPH NODE BIOPSY Right 06/15/2015   Procedure: RADIOACTIVE SEED GUIDED PARTIAL MASTECTOMY WITH AXILLARY SENTINEL LYMPH NODE BIOPSY;  Surgeon: Rolm Bookbinder, MD;  Location: [place];  Service: General;  Laterality: Right;  . REDUCTION MAMMAPLASTY Bilateral 2017    There were no vitals filed for this visit.   Subjective Assessment - 08/13/20 0808    Subjective I've gone back to the original exercises I was doing the last time I was here. The only one that bothers me is the overhead stretch.    Pertinent History history of Rt lumpectomy in 2017 with 0/6 lymph nodes positive.  Completed radiation. Another left sided breast reduction coming up with Dr. Iran Planas.    Patient Stated Goals see if there is anything else that will help    Currently in Pain? No/denies                             [facility] Adult PT Treatment/Exercise -  08/13/20 0001      Shoulder Exercises: Standing   Other Standing Exercises Bil UE 3 way raises with 1# on each wrist, standing with back against wall/core engaged, shoulders and head against wall x12 each    Other Standing Exercises Wall Push Ups x10 returning therapist demo and VCs for core engaged      Shoulder Exercises: Pulleys   Flexion 2 minutes   1# on wrist   ABduction 2 minutes   1# on wrist     Shoulder Exercises: Therapy Ball   Flexion Both;10 reps   1# wrist; forward lean into end of stretch   ABduction Right;10 reps   1#  on wrist; same side lean into end of stretch     Manual Therapy   Soft tissue mobilization to the Rt pectoralis, anterior shoulder, UT, deltoid, and latisssimus in supine and lat and posterior shoulder in sidelying    Passive ROM to the Rt shoulder into flexion and abduction, horizontal abduction, and D2 as tolerated    Kinesiotex Facilitate Muscle      Kinesiotix   Facilitate Muscle  one 1 strip anterior shoulder towards scapula 50% with retraction held applied after skinkote and education on removal.  pt has used KT before and is aware of precautions and removal                  PT Education - 08/13/20 0855    Education Details Standing bil UE 3 way raises    Person(s) Educated Patient    Methods Explanation;Demonstration;Handout    Comprehension Verbalized understanding;Returned demonstration               PT Long Term Goals - 08/11/20 1709      PT LONG TERM GOAL #1   Title Pt will report ease of reaching in the back seat of the car improved by at least 50%    Baseline unable to reach to son    Time 5    Period Weeks    Status On-going      PT LONG TERM GOAL #2   Title Pt will be able to do hair without increased pain over 2/10 in the Rt shoulder    Time 5    Period Weeks    Status On-going      PT LONG TERM GOAL #3   Title Pt will demonstrate correct use of compression garments and lymphedema pump to manage chronic lymphedema    Baseline has garments but still trying to upgrade pump    Status On-going      PT LONG TERM GOAL #4   Title Pt will decrese QDASH to less than 53%    Status On-going                 Plan - 08/13/20 0846    Clinical Impression Statement Began session with manual thearpy. Pt had full P/ROM in all directions in Rt shoulder and no tightness at pectoralis insertion today either. So progressed to gentle strengthening exercises which pt tolerated very well without any increased pain so also added these to HEP. Pt requested  kinesiotape as done at a previous session so reapplied this at end of session. Pt reports biggest complaint at home is OH reaching so progressed strength to include this as well today. Pt reports Addison from [employer] left her a message last night so she will call them back today.    Personal Factors and Comorbidities Comorbidity 2;Past/Current Experience;Time since onset of  injury/illness/exacerbation    Comorbidities lymphedema, radiation history    Examination-Activity Limitations Lift;Carry    Examination-Participation Restrictions Cleaning    Stability/Clinical Decision Making Stable/Uncomplicated    Rehab Potential Good    PT Frequency 1x / week    PT Duration 8 weeks    PT Treatment/Interventions ADLs/Self Care Home Management;Therapeutic exercise;Patient/family education;Manual techniques;Manual lymph drainage;Taping;Passive range of motion;Dry needling    PT Next Visit Plan Schedule with Addison for pump yet?, review stretches,STM Rt lat/pectoralis/upper quadrant with PROM and joint mob to improve shoulder extension, postural TE; review HEP and cont kinesiotape prn    PT Home Exercise Plan Standing bil UE 3 way raises and OH presses    Consulted and Agree with Plan of Care Patient           Patient will benefit from skilled therapeutic intervention in order to improve the following deficits and impairments:  Pain,Postural dysfunction,Decreased range of motion,Impaired UE functional use,Increased edema  Visit Diagnosis: Lymphedema  Stiffness of right shoulder, not elsewhere classified  Chronic right shoulder pain  Abnormal posture     Problem List Patient Active Problem List   Diagnosis Date Noted  . Atypical glandular cells on cervical Pap smear on 12/31/18 02/22/2019  . Port catheter in place 12/28/2015  . Chemotherapy induced neutropenia ([place]) 05/14/2015  . 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 Tooth ache  01/09/2015  . Genetic testing 12/26/2014  . Family history of breast cancer   . Malignant neoplasm of lower-inner quadrant of right breast of female, estrogen receptor negative ([place]) 11/25/2014  . Migraine 11/19/2014    Otelia Limes, PTA 08/13/2020, 9:04 AM  [place], [place], [address] Phone: [phone]   Fax:  [phone]  Name: SABRIYAH WILCHER MRN: [id number] Date of Birth: 07-10-1975

## 2020-08-19 ENCOUNTER — Ambulatory Visit: Payer: Medicare Other | Attending: General Surgery

## 2020-08-19 ENCOUNTER — Other Ambulatory Visit: Payer: Self-pay

## 2020-08-19 DIAGNOSIS — M25511 Pain in right shoulder: Secondary | ICD-10-CM | POA: Diagnosis not present

## 2020-08-19 DIAGNOSIS — I890 Lymphedema, not elsewhere classified: Secondary | ICD-10-CM | POA: Diagnosis not present

## 2020-08-19 DIAGNOSIS — R293 Abnormal posture: Secondary | ICD-10-CM | POA: Insufficient documentation

## 2020-08-19 DIAGNOSIS — G8929 Other chronic pain: Secondary | ICD-10-CM | POA: Insufficient documentation

## 2020-08-19 DIAGNOSIS — M25611 Stiffness of right shoulder, not elsewhere classified: Secondary | ICD-10-CM | POA: Diagnosis not present

## 2020-08-19 NOTE — Patient Instructions (Signed)
Over Head Pull: Narrow and Wide Grip   Cancer Rehab [phone]   On back, knees bent, feet flat, band across thighs, elbows straight but relaxed. Pull hands apart (start). Keeping elbows straight, bring arms up and over head, hands toward floor. Keep pull steady on band. Hold momentarily. Return slowly, keeping pull steady, back to start. Then do same with a wider grip on the band (past shoulder width) Repeat _5-10__ times. Band color __red____   Side Pull: Double Arm   On back, knees bent, feet flat. Arms perpendicular to body, shoulder level, elbows straight but relaxed. Pull arms out to sides, elbows straight. Resistance band comes across collarbones, hands toward floor. Hold momentarily. Slowly return to starting position. Repeat _5-10__ times. Band color _red____   Sword   On back, knees bent, feet flat, left hand on left hip, right hand above left. Pull right arm DIAGONALLY (hip to shoulder) across chest. Bring right arm along head toward floor. Hold momentarily. Slowly return to starting position. Repeat _5-10__ times. Do with left arm. Band color _red_____   Shoulder Rotation: Double Arm   On back, knees bent, feet flat, elbows tucked at sides, bent 90, hands palms up. Pull hands apart and down toward floor, keeping elbows near sides. Hold momentarily. Slowly return to starting position. Repeat _5-10__ times. Band color __red____

## 2020-08-19 NOTE — Therapy (Signed)
[place], [place], [address] Phone: [phone]   Fax:  [phone]  Physical Therapy Treatment  Patient Details  Name: Susan Wilson MRN: [id number] Date of Birth: [birth year] Referring Provider (PT): Dr. Donne Hazel   Encounter Date: 08/19/2020   PT End of Session - 08/19/20 1134    Visit Number 5    Number of Visits 8    Date for PT Re-Evaluation 09/15/20    Authorization Type medicare    PT Start Time 1108    PT Stop Time 1202    PT Time Calculation (min) 54 min    Activity Tolerance Patient tolerated treatment well    Behavior During Therapy [facility] for tasks assessed/performed           Past Medical History:  Diagnosis Date  . Anemia   . Breast asymmetry 12/2015  . Breast cancer ([place]) 2017   right breast  . History of breast cancer 11/2014   right  . History of chemotherapy   . Migraines   . 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 Personal history of chemotherapy 2017  . Personal history of radiation therapy 2017    Past Surgical History:  Procedure Laterality Date  . BREAST LUMPECTOMY Right 2017  . BREAST RECONSTRUCTION Right 06/23/2015   Procedure: ONCOPLASTIC RIGHT BREAST REDUCTION;  Surgeon: Irene Limbo, MD;  Location: [place];  Service: Plastics;  Laterality: Right;  . BREAST REDUCTION SURGERY Left 06/23/2015   Procedure: MAMMARY REDUCTION  (BREAST) LEFT BREAST FOR SYMETRY (BILATERAL BREAST REDUCTION);  Surgeon: Irene Limbo, MD;  Location: [place];  Service: Plastics;  Laterality: Left;  . BREAST REDUCTION SURGERY Left 01/15/2016   Procedure: MAMMARY REDUCTION  (BREAST) LEFT;  Surgeon: Irene Limbo, MD;  Location: [place];  Service: Plastics;  Laterality: Left;  Marland Kitchen MASTOPEXY Left 01/15/2016   Procedure: POSSIBLE LEFT MASTOPEXY;  Surgeon: Irene Limbo, MD;  Location: [place];  Service: Plastics;  Laterality: Left;  . PORT-A-CATH REMOVAL Right 01/15/2016   Procedure: REMOVAL OF RIGHT PORT-A-CATH;  Surgeon: Irene Limbo, MD;  Location: [place];  Service: Plastics;  Laterality: Right;  . PORTACATH PLACEMENT Right 12/16/2014   Procedure: INSERTION PORT-A-CATH WITH ULTRASOUND;  Surgeon: Rolm Bookbinder, MD;  Location: [place];  Service: General;  Laterality: Right;  . RADIOACTIVE SEED GUIDED PARTIAL MASTECTOMY WITH AXILLARY SENTINEL LYMPH NODE BIOPSY Right 06/15/2015   Procedure: RADIOACTIVE SEED GUIDED PARTIAL MASTECTOMY WITH AXILLARY SENTINEL LYMPH NODE BIOPSY;  Surgeon: Rolm Bookbinder, MD;  Location: [place];  Service: General;  Laterality: Right;  . REDUCTION MAMMAPLASTY Bilateral 2017    There were no vitals filed for this visit.   Subjective Assessment - 08/19/20 1108    Subjective I was pretty sore yesterday with the weather so it's just still a little from that. But I have been able to do more. I am still struggling though with reaching into the back of the car and reaching up to do my hair. I've spoken with [employer] rep and she just sent me the financial form. So I'll send that back to her today and she said I should be 100% covered for the upgraded chest piece.    Pertinent History history of Rt lumpectomy in 2017 with 0/6 lymph nodes positive.  Completed radiation. Another left sided breast reduction coming up with Dr. Iran Planas.    Patient Stated Goals  see if there is anything else that will help    Currently in Pain? No/denies   just feels sore today                            [facility] Adult PT Treatment/Exercise - 08/19/20 0001      Shoulder Exercises: Supine   Horizontal ABduction Strengthening;Both;5 reps;Theraband    Theraband Level (Shoulder Horizontal ABduction) Level 2 (Red)    External Rotation Strengthening;Both;5 reps    Theraband Level (Shoulder External Rotation) Level 2 (Red)     Flexion Strengthening;Both;5 reps;Theraband   Narrow and Wide Grip, 5x each   Theraband Level (Shoulder Flexion) Level 2 (Red)    Diagonals Strengthening;Both;5 reps;Theraband    Theraband Level (Shoulder Diagonals) Level 2 (Red)      Shoulder Exercises: Standing   Other Standing Exercises Bil UE 3 way raises with 2# on each wrist, standing with back against wall/core engaged, shoulders and head against wall x10 each    Other Standing Exercises Wall Push Ups x10 returning therapist demo and VCs for core engaged      Shoulder Exercises: Pulleys   Flexion 2 minutes    ABduction 2 minutes      Shoulder Exercises: Therapy Ball   Flexion Both;10 reps   forward lean into end of stretch   ABduction Right;10 reps   same side lean into end of stretch     Manual Therapy   Soft tissue mobilization In Supine with cocoa butter to the Rt pectoralis, anterior shoulder, and deltoid; then scar tissue mobs along SLNB incision, tenderness here improved by end of session    Passive ROM to the Rt shoulder into flexion and abduction, and D2 as tolerated    Kinesiotex --      Kinesiotix   Facilitate Muscle  --                  PT Education - 08/19/20 1133    Education Details Supine scapular series with red theraband    Person(s) Educated Patient    Methods Explanation;Demonstration;Handout    Comprehension Verbalized understanding;Returned demonstration;Need further instruction               PT Long Term Goals - 08/11/20 1709      PT LONG TERM GOAL #1   Title Pt will report ease of reaching in the back seat of the car improved by at least 50%    Baseline unable to reach to son    Time 5    Period Weeks    Status On-going      PT LONG TERM GOAL #2   Title Pt will be able to do hair without increased pain over 2/10 in the Rt shoulder    Time 5    Period Weeks    Status On-going      PT LONG TERM GOAL #3   Title Pt will demonstrate correct use of compression garments and  lymphedema pump to manage chronic lymphedema    Baseline has garments but still trying to upgrade pump    Status On-going      PT LONG TERM GOAL #4   Title Pt will decrese QDASH to less than 53%    Status On-going                 Plan - 08/19/20 1134    Clinical Impression Statement Continued with strengthening and flexibility of Rt shoulder. Progressed  HEP to include supine scapular series which she's had at previous episodes but has lost handout so reviewed and reissued this today. Then continued with P/ROM of Rt shoulder and STM of anterior shoulder and along SLNB incision. Pt with tenderness here initially but this improved by end of session.    Personal Factors and Comorbidities Comorbidity 2;Past/Current Experience;Time since onset of injury/illness/exacerbation    Comorbidities lymphedema, radiation history    Examination-Activity Limitations Lift;Carry    Examination-Participation Restrictions Cleaning    Stability/Clinical Decision Making Stable/Uncomplicated    Rehab Potential Good    PT Frequency 1x / week    PT Duration 8 weeks    PT Treatment/Interventions ADLs/Self Care Home Management;Therapeutic exercise;Patient/family education;Manual techniques;Manual lymph drainage;Taping;Passive range of motion;Dry needling    PT Next Visit Plan Review stretches,STM Rt lat/pectoralis/upper quadrant with PROM and joint mob to improve shoulder extension, postural TE; review HEP and cont kinesiotape prn    PT Home Exercise Plan Standing bil UE 3 way raises and OH presses; supine scapular series with red therband    Consulted and Agree with Plan of Care Patient           Patient will benefit from skilled therapeutic intervention in order to improve the following deficits and impairments:  Pain,Postural dysfunction,Decreased range of motion,Impaired UE functional use,Increased edema  Visit Diagnosis: Lymphedema  Stiffness of right shoulder, not elsewhere classified  Chronic  right shoulder pain  Abnormal posture     Problem List Patient Active Problem List   Diagnosis Date Noted  . Atypical glandular cells on cervical Pap smear on 12/31/18 02/22/2019  . Port catheter in place 12/28/2015  . Chemotherapy induced neutropenia ([place]) 05/14/2015  . 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 Tooth ache 01/09/2015  . Genetic testing 12/26/2014  . Family history of breast cancer   . Malignant neoplasm of lower-inner quadrant of right breast of female, estrogen receptor negative ([place]) 11/25/2014  . Migraine 11/19/2014    Susan Wilson, Susan Wilson 08/19/2020, 12:15 PM  [place], [place], [address] Phone: [phone]   Fax:  [phone]  Name: Susan Wilson MRN: [id number] Date of Birth: Mar 25, 1976

## 2020-08-26 ENCOUNTER — Ambulatory Visit: Payer: Medicare Other

## 2020-09-08 ENCOUNTER — Ambulatory Visit: Payer: Medicare Other

## 2020-09-08 ENCOUNTER — Other Ambulatory Visit: Payer: Self-pay

## 2020-09-08 DIAGNOSIS — G8929 Other chronic pain: Secondary | ICD-10-CM | POA: Diagnosis not present

## 2020-09-08 DIAGNOSIS — M25611 Stiffness of right shoulder, not elsewhere classified: Secondary | ICD-10-CM

## 2020-09-08 DIAGNOSIS — I890 Lymphedema, not elsewhere classified: Secondary | ICD-10-CM | POA: Diagnosis not present

## 2020-09-08 DIAGNOSIS — R293 Abnormal posture: Secondary | ICD-10-CM | POA: Diagnosis not present

## 2020-09-08 DIAGNOSIS — M25511 Pain in right shoulder: Secondary | ICD-10-CM

## 2020-09-08 NOTE — Therapy (Signed)
[place], [place], [address] Phone: [phone]   Fax:  [phone]  Physical Therapy Treatment  Patient Details  Name: Susan Wilson MRN: [id number] Date of Birth: April 26, 1976 Referring Provider (PT): Dr. Donne Hazel   Encounter Date: 09/08/2020   PT End of Session - 09/08/20 0846    Visit Number 6    Number of Visits 8    Date for PT Re-Evaluation 09/15/20    Authorization Type medicare    Progress Note Due on Visit 10    PT Start Time 0806    PT Stop Time 0900    PT Time Calculation (min) 54 min    Activity Tolerance Patient tolerated treatment well    Behavior During Therapy [facility] for tasks assessed/performed           Past Medical History:  Diagnosis Date  . Anemia   . Breast asymmetry 12/2015  . Breast cancer ([place]) 2017   right breast  . History of breast cancer 11/2014   right  . History of chemotherapy   . Migraines   . 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 Personal history of chemotherapy 2017  . Personal history of radiation therapy 2017    Past Surgical History:  Procedure Laterality Date  . BREAST LUMPECTOMY Right 2017  . BREAST RECONSTRUCTION Right 06/23/2015   Procedure: ONCOPLASTIC RIGHT BREAST REDUCTION;  Surgeon: Irene Limbo, MD;  Location: [place];  Service: Plastics;  Laterality: Right;  . BREAST REDUCTION SURGERY Left 06/23/2015   Procedure: MAMMARY REDUCTION  (BREAST) LEFT BREAST FOR SYMETRY (BILATERAL BREAST REDUCTION);  Surgeon: Irene Limbo, MD;  Location: [place];  Service: Plastics;  Laterality: Left;  . BREAST REDUCTION SURGERY Left 01/15/2016   Procedure: MAMMARY REDUCTION  (BREAST) LEFT;  Surgeon: Irene Limbo, MD;  Location: [place];  Service: Plastics;  Laterality: Left;  Marland Kitchen MASTOPEXY Left 01/15/2016   Procedure: POSSIBLE LEFT MASTOPEXY;  Surgeon: Irene Limbo,  MD;  Location: [place];  Service: Plastics;  Laterality: Left;  . PORT-A-CATH REMOVAL Right 01/15/2016   Procedure: REMOVAL OF RIGHT PORT-A-CATH;  Surgeon: Irene Limbo, MD;  Location: [place];  Service: Plastics;  Laterality: Right;  . PORTACATH PLACEMENT Right 12/16/2014   Procedure: INSERTION PORT-A-CATH WITH ULTRASOUND;  Surgeon: Rolm Bookbinder, MD;  Location: [place];  Service: General;  Laterality: Right;  . RADIOACTIVE SEED GUIDED PARTIAL MASTECTOMY WITH AXILLARY SENTINEL LYMPH NODE BIOPSY Right 06/15/2015   Procedure: RADIOACTIVE SEED GUIDED PARTIAL MASTECTOMY WITH AXILLARY SENTINEL LYMPH NODE BIOPSY;  Surgeon: Rolm Bookbinder, MD;  Location: [place];  Service: General;  Laterality: Right;  . REDUCTION MAMMAPLASTY Bilateral 2017    There were no vitals filed for this visit.   Subjective Assessment - 09/08/20 0807    Subjective My Rt shoulder felt pretty good the first couple days since I was here last but then it started getting achy again. I used my kinesiotape over the weekend to help it.    Pertinent History history of Rt lumpectomy in 2017 with 0/6 lymph nodes positive.  Completed radiation. Another left sided breast reduction coming up with Dr. Iran Planas.    Patient Stated Goals see if there is anything else that will help    Currently in Pain? Yes    Pain Score 4     Pain Location Shoulder    Pain Orientation Right  Pain Descriptors / Indicators Aching    Pain Type Chronic pain    Pain Radiating Towards towards lateral trunk    Pain Onset More than a month ago    Pain Frequency Intermittent    Aggravating Factors  the weather going from hot and cold doesn't help    Pain Relieving Factors kinesiotape and muscle relaxers                             [facility] Adult PT Treatment/Exercise - 09/08/20 0001      Shoulder Exercises: Standing   ABduction Strengthening;Both;10 reps;Weights   then same into  scaption   Shoulder ABduction Weight (lbs) 2    Extension Strengthening;Both;15 reps;Theraband    Theraband Level (Shoulder Extension) Level 2 (Red)    Extension Limitations VCs to keep elbows extended    Row --    Theraband Level (Shoulder Row) --    Other Standing Exercises Wall Push Ups x10, VCs for core engaged      Shoulder Exercises: Pulleys   Flexion 2 minutes    ABduction 2 minutes      Shoulder Exercises: Therapy Ball   Flexion Both;10 reps   forward lean into end of stretch and dropping Rt shoulder for increased stretch     Manual Therapy   Soft tissue mobilization In Supine with cocoa butter to the Rt pectoralis, anterior shoulder, and deltoid; then scar tissue mobs along SLNB incision; then also to Rt lateral trunk where pt c/o most tenderness    Passive ROM to the Rt shoulder into flexion and abduction, and D2 as tolerated    Kinesiotex Facilitate Muscle      Kinesiotix   Facilitate Muscle  one 1 strip anterior shoulder towards scapula 50% with retraction held applied after skinkote and education on removal.  pt has used KT before and is aware of precautions and removal                       PT Long Term Goals - 08/11/20 1709      PT LONG TERM GOAL #1   Title Pt will report ease of reaching in the back seat of the car improved by at least 50%    Baseline unable to reach to son    Time 5    Period Weeks    Status On-going      PT LONG TERM GOAL #2   Title Pt will be able to do hair without increased pain over 2/10 in the Rt shoulder    Time 5    Period Weeks    Status On-going      PT LONG TERM GOAL #3   Title Pt will demonstrate correct use of compression garments and lymphedema pump to manage chronic lymphedema    Baseline has garments but still trying to upgrade pump    Status On-going      PT LONG TERM GOAL #4   Title Pt will decrese QDASH to less than 53%    Status On-going                 Plan - 09/08/20 0846    Clinical  Impression Statement Pt with increaed tightness at Rt lateral trunk so focused STM here with cocoa butter and Rt shoulder in end ROM. Good softening noted by end of session here. Then continued with AA/ROM  for end range stretching and bil UE strength.  Pt reports arm seems to fatigue with exercises but she is able to complete all. Also added kinesiotape as done before as pt feels this beneficial at easing ache of Rt shoulder.    Personal Factors and Comorbidities Comorbidity 2;Past/Current Experience;Time since onset of injury/illness/exacerbation    Comorbidities lymphedema, radiation history    Examination-Activity Limitations Lift;Carry    Examination-Participation Restrictions Cleaning    Stability/Clinical Decision Making Stable/Uncomplicated    Rehab Potential Good    PT Frequency 1x / week    PT Duration 8 weeks    PT Treatment/Interventions ADLs/Self Care Home Management;Therapeutic exercise;Patient/family education;Manual techniques;Manual lymph drainage;Taping;Passive range of motion;Dry needling    PT Next Visit Plan Cont STM and manual therapy prn to Rt upper quadrant; cont postural and UE strength; cont kinesiotape prn and progress HEP prn    PT Home Exercise Plan Standing bil UE 3 way raises and OH presses; supine scapular series with red therband    Consulted and Agree with Plan of Care Patient           Patient will benefit from skilled therapeutic intervention in order to improve the following deficits and impairments:  Pain,Postural dysfunction,Decreased range of motion,Impaired UE functional use,Increased edema  Visit Diagnosis: Lymphedema  Stiffness of right shoulder, not elsewhere classified  Chronic right shoulder pain  Abnormal posture     Problem List Patient Active Problem List   Diagnosis Date Noted  . Atypical glandular cells on cervical Pap smear on 12/31/18 02/22/2019  . Port catheter in place 12/28/2015  . Chemotherapy induced neutropenia ([place])  05/14/2015  . 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 Tooth ache 01/09/2015  . Genetic testing 12/26/2014  . Family history of breast cancer   . Malignant neoplasm of lower-inner quadrant of right breast of female, estrogen receptor negative ([place]) 11/25/2014  . Migraine 11/19/2014    Otelia Limes, PTA 09/08/2020, 8:59 AM  [facility] [place], [place], [address] Phone: [phone]   Fax:  [phone]  Name: MCKENNAH KRETCHMER MRN: [id number] Date of Birth: 1975/05/24

## 2020-09-10 ENCOUNTER — Encounter: Payer: Self-pay | Admitting: Oncology

## 2020-09-10 ENCOUNTER — Encounter: Payer: Medicare Other | Admitting: Rehabilitation

## 2020-09-10 ENCOUNTER — Telehealth: Payer: Self-pay

## 2020-09-10 NOTE — Telephone Encounter (Signed)
Returned call to pt. Pt needs a reduction in hours form filled out. Pt will send form via MyChart or fax to this office.

## 2020-09-15 ENCOUNTER — Encounter: Payer: Medicare Other | Admitting: Rehabilitation

## 2020-09-17 ENCOUNTER — Other Ambulatory Visit: Payer: Self-pay

## 2020-09-17 ENCOUNTER — Ambulatory Visit: Payer: Medicare Other | Attending: General Surgery

## 2020-09-17 DIAGNOSIS — M6281 Muscle weakness (generalized): Secondary | ICD-10-CM | POA: Insufficient documentation

## 2020-09-17 DIAGNOSIS — M25611 Stiffness of right shoulder, not elsewhere classified: Secondary | ICD-10-CM | POA: Insufficient documentation

## 2020-09-17 DIAGNOSIS — M25511 Pain in right shoulder: Secondary | ICD-10-CM | POA: Diagnosis not present

## 2020-09-17 DIAGNOSIS — G8929 Other chronic pain: Secondary | ICD-10-CM | POA: Diagnosis not present

## 2020-09-17 DIAGNOSIS — R293 Abnormal posture: Secondary | ICD-10-CM | POA: Diagnosis not present

## 2020-09-17 DIAGNOSIS — I890 Lymphedema, not elsewhere classified: Secondary | ICD-10-CM | POA: Insufficient documentation

## 2020-09-17 NOTE — Therapy (Signed)
[place], [place], [address] Phone: [phone]   Fax:  [phone]  Physical Therapy Treatment  Patient Details  Name: Susan Wilson MRN: [id number] Date of Birth: 1975/10/24 Referring Provider (PT): Dr. Donne Hazel   Encounter Date: 09/17/2020   PT End of Session - 09/17/20 1059    Visit Number 7    Number of Visits 12    Date for PT Re-Evaluation 10/22/20    Authorization Type medicare           Past Medical History:  Diagnosis Date  . Anemia   . Breast asymmetry 12/2015  . Breast cancer ([facility]) 2017   right breast  . History of breast cancer 11/2014   right  . History of chemotherapy   . Migraines   . 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 Personal history of chemotherapy 2017  . Personal history of radiation therapy 2017    Past Surgical History:  Procedure Laterality Date  . BREAST LUMPECTOMY Right 2017  . BREAST RECONSTRUCTION Right 06/23/2015   Procedure: ONCOPLASTIC RIGHT BREAST REDUCTION;  Surgeon: Irene Limbo, MD;  Location: [facility];  Service: Plastics;  Laterality: Right;  . BREAST REDUCTION SURGERY Left 06/23/2015   Procedure: MAMMARY REDUCTION  (BREAST) LEFT BREAST FOR SYMETRY (BILATERAL BREAST REDUCTION);  Surgeon: Irene Limbo, MD;  Location: [facility];  Service: Plastics;  Laterality: Left;  . BREAST REDUCTION SURGERY Left 01/15/2016   Procedure: MAMMARY REDUCTION  (BREAST) LEFT;  Surgeon: Irene Limbo, MD;  Location: [facility];  Service: Plastics;  Laterality: Left;  Marland Kitchen MASTOPEXY Left 01/15/2016   Procedure: POSSIBLE LEFT MASTOPEXY;  Surgeon: Irene Limbo, MD;  Location: [facility];  Service: Plastics;  Laterality: Left;  . PORT-A-CATH REMOVAL Right 01/15/2016   Procedure: REMOVAL OF RIGHT PORT-A-CATH;  Surgeon: Irene Limbo, MD;  Location: [facility];   Service: Plastics;  Laterality: Right;  . PORTACATH PLACEMENT Right 12/16/2014   Procedure: INSERTION PORT-A-CATH WITH ULTRASOUND;  Surgeon: Rolm Bookbinder, MD;  Location: [facility];  Service: General;  Laterality: Right;  . RADIOACTIVE SEED GUIDED PARTIAL MASTECTOMY WITH AXILLARY SENTINEL LYMPH NODE BIOPSY Right 06/15/2015   Procedure: RADIOACTIVE SEED GUIDED PARTIAL MASTECTOMY WITH AXILLARY SENTINEL LYMPH NODE BIOPSY;  Surgeon: Rolm Bookbinder, MD;  Location: [facility];  Service: General;  Laterality: Right;  . REDUCTION MAMMAPLASTY Bilateral 2017    There were no vitals filed for this visit.   Subjective Assessment - 09/17/20 1006    Subjective Shoulder has been a little achy this week but not bad.  I have been using my machine but we are trying to get the one that addresses the chest area.  Still using K tape.  Overall pain is about 30% better.  I notice less swelling, ROM doesn't feel as tight.  Not as much nerve pain. Fatigue in the right arm varies with repetitive typing/housework.  If I stagger things it works better. I try not to do all my exercises at once because if I do exercises and other activities I develop spasms in the side of my right breast and radiates under my arm and into my shoulder and down my arm.  That is a muscle relaxer day.  Not as many spasms now that the weather is warmer, maybe 2-3 times/week versus 4-5 times per week.    Pertinent History history of Rt lumpectomy in 2017 with  0/6 lymph nodes positive.  Completed radiation. Another left sided breast reduction coming up with Dr. Iran Planas.    Limitations Lifting    Patient Stated Goals see if there is anything else that will help    Currently in Pain? Yes    Pain Score 3     Pain Location Shoulder    Pain Orientation Right    Pain Descriptors / Indicators Aching    Pain Type Chronic pain    Pain Onset More than a month ago    Pain Frequency Intermittent              [facility] PT Assessment -  09/17/20 0001      Assessment   Medical Diagnosis Rt breast cancer    Referring Provider (PT) Dr. Donne Hazel    Onset Date/Surgical Date 05/17/15    Hand Dominance Right      Precautions   Precaution Comments lymphedema Rt UE      Prior Function   Level of Independence Independent      AROM   Right Shoulder Extension 44 Degrees    Right Shoulder Flexion 160 Degrees    Right Shoulder ABduction 177 Degrees    Right Shoulder Internal Rotation 70 Degrees   fingers tingle   Right Shoulder External Rotation 98 Degrees   fingers tingle     Strength   Right Shoulder Flexion 4+/5    Right Shoulder ABduction 4+/5    Right Shoulder Internal Rotation 4+/5    Right Shoulder External Rotation 4+/5      Palpation   Palpation comment Tender greatest at right pectorals and posterior shoulder and axillary region, but also tender mildly at mid delt, UT             LYMPHEDEMA/ONCOLOGY QUESTIONNAIRE - 09/17/20 0001      Type   Cancer Type right breast      Surgeries   Lumpectomy Date 06/15/15      Right Upper Extremity Lymphedema   At Axilla  37 cm    15 cm Proximal to Olecranon Process 39 cm    10 cm Proximal to Olecranon Process 37.7 cm    Olecranon Process 28.5 cm    15 cm Proximal to Ulnar Styloid Process 28.3 cm    10 cm Proximal to Ulnar Styloid Process 25 cm    Just Proximal to Ulnar Styloid Process 18.3 cm    Across Hand at [employer] 19.8 cm    At [place] of 2nd Digit 6.5 cm              Quick Dash - 09/17/20 0001    Open a tight or new jar Moderate difficulty    Do heavy household chores (wash walls, wash floors) Moderate difficulty    Carry a shopping bag or briefcase Moderate difficulty    Wash your back Severe difficulty    Use a knife to cut food Mild difficulty    Recreational activities in which you take some force or impact through your arm, shoulder, or hand (golf, hammering, tennis) Moderate difficulty    During the past week, to what extent has your  arm, shoulder or hand problem interfered with your normal social activities with family, friends, neighbors, or groups? Modererately    During the past week, to what extent has your arm, shoulder or hand problem limited your work or other regular daily activities Modererately    Arm, shoulder, or hand pain. Moderate    Tingling (pins and  needles) in your arm, shoulder, or hand Moderate    Difficulty Sleeping Moderate difficulty    DASH Score 50 %                  [facility] Adult PT Treatment/Exercise - 09/17/20 0001      Shoulder Exercises: Pulleys   Flexion 2 minutes    ABduction 2 minutes                       PT Long Term Goals - 09/17/20 1037      PT LONG TERM GOAL #1   Title Pt will report ease of reaching in the back seat of the car improved by at least 50%    Baseline 10-15% better    Time 5    Period Weeks    Status On-going    Target Date 10/22/20      PT LONG TERM GOAL #2   Title Pt will be able to do hair without increased pain over 2/10 in the Rt shoulder    Baseline 4/10 now    Time 5    Period Weeks    Status On-going    Target Date 10/22/20      PT LONG TERM GOAL #3   Title Pt will demonstrate correct use of compression garments and lymphedema pump to manage chronic lymphedema    Baseline has garments but still trying to upgrade pump    Time 5    Period Weeks    Status On-going    Target Date 10/22/20      PT LONG TERM GOAL #4   Title Pt will decrese QDASH to less than 53%    Baseline 50%    Time 8    Period Weeks    Status Achieved                 Plan - 09/17/20 1100    Clinical Impression Statement Pt has made good improvements in shoulder ROM and strength and was without pain complaints when assessed today.  Swelling has also improved some from initial visits.  Muscle spasms have decreased from 4-5 times per week to 2-3 times per week as weather has warmed up.  Overall pain is improved approximately 30% per pt report.  She  continues with significant tenderness at right pectorals and lateral breast as well as posterior shoulder, with mild tenderness throughout the upper quarter. She will benefit from continued skilled therapy to address remaining deficits    Personal Factors and Comorbidities Comorbidity 2;Past/Current Experience;Time since onset of injury/illness/exacerbation    Comorbidities lymphedema, radiation history    Examination-Activity Limitations Lift;Carry    Examination-Participation Restrictions Cleaning    Stability/Clinical Decision Making Stable/Uncomplicated    Rehab Potential Good    PT Frequency 1x / week    PT Duration Other (comment)   5 weeks   PT Treatment/Interventions ADLs/Self Care Home Management;Therapeutic exercise;Patient/family education;Manual techniques;Manual lymph drainage;Taping;Passive range of motion;Dry needling    PT Next Visit Plan Cont STM and manual therapy prn to Rt upper quadrant; cont postural and UE strength; cont kinesiotape prn and progress HEP prn    PT Home Exercise Plan Standing bil UE 3 way raises and OH presses; supine scapular series with red therband    Consulted and Agree with Plan of Care Patient           Patient will benefit from skilled therapeutic intervention in order to improve the following deficits and impairments:  Pain,Postural dysfunction,Decreased range of motion,Impaired UE functional use,Increased edema  Visit Diagnosis: Lymphedema  Stiffness of right shoulder, not elsewhere classified  Chronic right shoulder pain  Abnormal posture  Lymphedema, not elsewhere classified  Muscle weakness (generalized)     Problem List Patient Active Problem List   Diagnosis Date Noted  . Atypical glandular cells on cervical Pap smear on 12/31/18 02/22/2019  . Port catheter in place 12/28/2015  . Chemotherapy induced neutropenia ([facility]) 05/14/2015  . 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 Anemia in neoplastic disease 04/02/2015   . Tooth ache 01/09/2015  . Genetic testing 12/26/2014  . Family history of breast cancer   . Malignant neoplasm of lower-inner quadrant of right breast of female, estrogen receptor negative ([facility]) 11/25/2014  . Migraine 11/19/2014    Claris Pong 09/17/2020, 12:15 PM  [place], [place], [address] Phone: [phone]   Fax:  [phone]  Name: Susan Wilson MRN: [id number] Date of Birth: 1975/12/20  Cheral Almas, PT 09/17/20 12:16 PM

## 2020-09-28 ENCOUNTER — Ambulatory Visit: Payer: Medicare Other

## 2020-09-28 ENCOUNTER — Other Ambulatory Visit: Payer: Self-pay

## 2020-09-28 DIAGNOSIS — G8929 Other chronic pain: Secondary | ICD-10-CM | POA: Diagnosis not present

## 2020-09-28 DIAGNOSIS — I890 Lymphedema, not elsewhere classified: Secondary | ICD-10-CM | POA: Diagnosis not present

## 2020-09-28 DIAGNOSIS — M25611 Stiffness of right shoulder, not elsewhere classified: Secondary | ICD-10-CM | POA: Diagnosis not present

## 2020-09-28 DIAGNOSIS — R293 Abnormal posture: Secondary | ICD-10-CM

## 2020-09-28 DIAGNOSIS — M6281 Muscle weakness (generalized): Secondary | ICD-10-CM | POA: Diagnosis not present

## 2020-09-28 DIAGNOSIS — M25511 Pain in right shoulder: Secondary | ICD-10-CM

## 2020-09-28 NOTE — Therapy (Signed)
[place], [place], [address] Phone: [phone]   Fax:  [phone]  Physical Therapy Treatment  Patient Details  Name: Susan Wilson MRN: [id number] Date of Birth: February 04, 1976 Referring Provider (PT): Dr. Donne Hazel   Encounter Date: 09/28/2020   PT End of Session - 09/28/20 1424    Visit Number 8    Number of Visits 12    Date for PT Re-Evaluation 10/22/20    Authorization Type medicare    Progress Note Due on Visit 10    PT Start Time 9735    PT Stop Time 1458    PT Time Calculation (min) 42 min    Activity Tolerance Patient tolerated treatment well    Behavior During Therapy [facility] for tasks assessed/performed           Past Medical History:  Diagnosis Date  . Anemia   . Breast asymmetry 12/2015  . Breast cancer ([facility]) 2017   right breast  . History of breast cancer 11/2014   right  . History of chemotherapy   . Migraines   . 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 Personal history of chemotherapy 2017  . Personal history of radiation therapy 2017    Past Surgical History:  Procedure Laterality Date  . BREAST LUMPECTOMY Right 2017  . BREAST RECONSTRUCTION Right 06/23/2015   Procedure: ONCOPLASTIC RIGHT BREAST REDUCTION;  Surgeon: Irene Limbo, MD;  Location: [facility];  Service: Plastics;  Laterality: Right;  . BREAST REDUCTION SURGERY Left 06/23/2015   Procedure: MAMMARY REDUCTION  (BREAST) LEFT BREAST FOR SYMETRY (BILATERAL BREAST REDUCTION);  Surgeon: Irene Limbo, MD;  Location: [facility];  Service: Plastics;  Laterality: Left;  . BREAST REDUCTION SURGERY Left 01/15/2016   Procedure: MAMMARY REDUCTION  (BREAST) LEFT;  Surgeon: Irene Limbo, MD;  Location: [facility];  Service: Plastics;  Laterality: Left;  Marland Kitchen MASTOPEXY Left 01/15/2016   Procedure: POSSIBLE LEFT MASTOPEXY;  Surgeon: Irene Limbo,  MD;  Location: [facility];  Service: Plastics;  Laterality: Left;  . PORT-A-CATH REMOVAL Right 01/15/2016   Procedure: REMOVAL OF RIGHT PORT-A-CATH;  Surgeon: Irene Limbo, MD;  Location: [facility];  Service: Plastics;  Laterality: Right;  . PORTACATH PLACEMENT Right 12/16/2014   Procedure: INSERTION PORT-A-CATH WITH ULTRASOUND;  Surgeon: Rolm Bookbinder, MD;  Location: [facility];  Service: General;  Laterality: Right;  . RADIOACTIVE SEED GUIDED PARTIAL MASTECTOMY WITH AXILLARY SENTINEL LYMPH NODE BIOPSY Right 06/15/2015   Procedure: RADIOACTIVE SEED GUIDED PARTIAL MASTECTOMY WITH AXILLARY SENTINEL LYMPH NODE BIOPSY;  Surgeon: Rolm Bookbinder, MD;  Location: [facility];  Service: General;  Laterality: Right;  . REDUCTION MAMMAPLASTY Bilateral 2017    There were no vitals filed for this visit.   Subjective Assessment - 09/28/20 1418    Subjective Sorry I'm running late, my tire was flat.    Pertinent History history of Rt lumpectomy in 2017 with 0/6 lymph nodes positive.  Completed radiation. Another left sided breast reduction coming up with Dr. Iran Planas.    Patient Stated Goals see if there is anything else that will help    Currently in Pain? Yes    Pain Score 3     Pain Location Chest    Pain Orientation Right    Pain Descriptors / Indicators Aching    Pain Type Chronic pain    Pain Onset More than a month ago  Pain Frequency Intermittent    Aggravating Factors  the weather    Pain Relieving Factors weather has been warm so my pain has been improved                             [facility] Adult PT Treatment/Exercise - 09/28/20 0001      Shoulder Exercises: Supine   Horizontal ABduction Strengthening;Both;12 reps;Weights   on half bolster roll   Horizontal ABduction Weight (lbs) 2    Flexion Strengthening;Right;Left;10 reps;Weights   on half bolster roll   Shoulder Flexion Weight (lbs) 2    Flexion Limitations VCs to  engage core    Other Supine Exercises Bil chest press with 2# over half bolster roll x15 each      Shoulder Exercises: Pulleys   Flexion 2 minutes    ABduction 2 minutes      Shoulder Exercises: Therapy Ball   Flexion Both;10 reps   forward lean into end of stretch; 1 # on wrist     Manual Therapy   Soft tissue mobilization to Rt lateral trunk where pt c/o most tightness    Passive ROM to the Rt shoulder into flexion and abduction, and D2 as tolerated                       PT Long Term Goals - 09/17/20 1037      PT LONG TERM GOAL #1   Title Pt will report ease of reaching in the back seat of the car improved by at least 50%    Baseline 10-15% better    Time 5    Period Weeks    Status On-going    Target Date 10/22/20      PT LONG TERM GOAL #2   Title Pt will be able to do hair without increased pain over 2/10 in the Rt shoulder    Baseline 4/10 now    Time 5    Period Weeks    Status On-going    Target Date 10/22/20      PT LONG TERM GOAL #3   Title Pt will demonstrate correct use of compression garments and lymphedema pump to manage chronic lymphedema    Baseline has garments but still trying to upgrade pump    Time 5    Period Weeks    Status On-going    Target Date 10/22/20      PT LONG TERM GOAL #4   Title Pt will decrese QDASH to less than 53%    Baseline 50%    Time 8    Period Weeks    Status Achieved                 Plan - 09/28/20 1424    Clinical Impression Statement Progressed pt to include supine bil UE strength over half bolster roll with weights which she did very well with. Also continued with manual therapy working to decrease Rt chest tightness where pt c/o intermittent achiness. Continued with full P/ROM of Rt shoulder.    Personal Factors and Comorbidities Comorbidity 2;Past/Current Experience;Time since onset of injury/illness/exacerbation    Comorbidities lymphedema, radiation history    Examination-Activity Limitations  Lift;Carry    Examination-Participation Restrictions Cleaning    Stability/Clinical Decision Making Stable/Uncomplicated    Rehab Potential Good    PT Frequency 1x / week    PT Duration Other (comment)   5 weeks   PT  Treatment/Interventions ADLs/Self Care Home Management;Therapeutic exercise;Patient/family education;Manual techniques;Manual lymph drainage;Taping;Passive range of motion;Dry needling    PT Next Visit Plan Cont STM and manual therapy prn to Rt upper quadrant; cont postural and UE strength; cont kinesiotape prn and progress HEP prn    PT Home Exercise Plan Standing bil UE 3 way raises and OH presses; supine scapular series with red therband    Consulted and Agree with Plan of Care Patient           Patient will benefit from skilled therapeutic intervention in order to improve the following deficits and impairments:  Pain,Postural dysfunction,Decreased range of motion,Impaired UE functional use,Increased edema  Visit Diagnosis: Lymphedema  Stiffness of right shoulder, not elsewhere classified  Chronic right shoulder pain  Abnormal posture  Lymphedema, not elsewhere classified  Muscle weakness (generalized)     Problem List Patient Active Problem List   Diagnosis Date Noted  . Atypical glandular cells on cervical Pap smear on 12/31/18 02/22/2019  . Port catheter in place 12/28/2015  . Chemotherapy induced neutropenia ([facility]) 05/14/2015  . 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 Tooth ache 01/09/2015  . Genetic testing 12/26/2014  . Family history of breast cancer   . Malignant neoplasm of lower-inner quadrant of right breast of female, estrogen receptor negative ([facility]) 11/25/2014  . Migraine 11/19/2014    Otelia Limes, PTA 09/28/2020, 3:04 PM  [place], [place], [address] Phone: [phone]   Fax:   [phone]  Name: SEHER SCHLAGEL MRN: [id number] Date of Birth: [birth year]

## 2020-10-02 ENCOUNTER — Telehealth: Payer: Self-pay

## 2020-10-02 NOTE — Telephone Encounter (Signed)
Pt called and states her employer needs further documentation as to why she needs PT and for how long. Pt states she has been discussing this with Val,RN. Will relay this message to Val.

## 2020-10-06 ENCOUNTER — Ambulatory Visit: Payer: Medicare Other | Admitting: Rehabilitation

## 2020-10-06 ENCOUNTER — Other Ambulatory Visit: Payer: Self-pay

## 2020-10-06 DIAGNOSIS — G8929 Other chronic pain: Secondary | ICD-10-CM

## 2020-10-06 DIAGNOSIS — R293 Abnormal posture: Secondary | ICD-10-CM | POA: Diagnosis not present

## 2020-10-06 DIAGNOSIS — M25611 Stiffness of right shoulder, not elsewhere classified: Secondary | ICD-10-CM

## 2020-10-06 DIAGNOSIS — I890 Lymphedema, not elsewhere classified: Secondary | ICD-10-CM

## 2020-10-06 DIAGNOSIS — M6281 Muscle weakness (generalized): Secondary | ICD-10-CM | POA: Diagnosis not present

## 2020-10-06 DIAGNOSIS — M25511 Pain in right shoulder: Secondary | ICD-10-CM | POA: Diagnosis not present

## 2020-10-06 NOTE — Therapy (Signed)
[place], [place], [address] Phone: [phone]   Fax:  [phone]  Physical Therapy Treatment  Patient Details  Name: Susan Wilson MRN: [id number] Date of Birth: 12/23/1975 Referring Provider (PT): Dr. Donne Hazel   Encounter Date: 10/06/2020   PT End of Session - 10/06/20 1204    Visit Number 9    Number of Visits 12    Date for PT Re-Evaluation 10/22/20    PT Start Time 1100    PT Stop Time 1159    PT Time Calculation (min) 59 min    Activity Tolerance Patient tolerated treatment well    Behavior During Therapy [facility] for tasks assessed/performed           Past Medical History:  Diagnosis Date  . Anemia   . Breast asymmetry 12/2015  . Breast cancer ([place]) 2017   right breast  . History of breast cancer 11/2014   right  . History of chemotherapy   . Migraines   . 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 Personal history of chemotherapy 2017  . Personal history of radiation therapy 2017    Past Surgical History:  Procedure Laterality Date  . BREAST LUMPECTOMY Right 2017  . BREAST RECONSTRUCTION Right 06/23/2015   Procedure: ONCOPLASTIC RIGHT BREAST REDUCTION;  Surgeon: Irene Limbo, MD;  Location: [place];  Service: Plastics;  Laterality: Right;  . BREAST REDUCTION SURGERY Left 06/23/2015   Procedure: MAMMARY REDUCTION  (BREAST) LEFT BREAST FOR SYMETRY (BILATERAL BREAST REDUCTION);  Surgeon: Irene Limbo, MD;  Location: [place];  Service: Plastics;  Laterality: Left;  . BREAST REDUCTION SURGERY Left 01/15/2016   Procedure: MAMMARY REDUCTION  (BREAST) LEFT;  Surgeon: Irene Limbo, MD;  Location: [place];  Service: Plastics;  Laterality: Left;  Marland Kitchen MASTOPEXY Left 01/15/2016   Procedure: POSSIBLE LEFT MASTOPEXY;  Surgeon: Irene Limbo, MD;  Location: [place];  Service: Plastics;   Laterality: Left;  . PORT-A-CATH REMOVAL Right 01/15/2016   Procedure: REMOVAL OF RIGHT PORT-A-CATH;  Surgeon: Irene Limbo, MD;  Location: [place];  Service: Plastics;  Laterality: Right;  . PORTACATH PLACEMENT Right 12/16/2014   Procedure: INSERTION PORT-A-CATH WITH ULTRASOUND;  Surgeon: Rolm Bookbinder, MD;  Location: [place];  Service: General;  Laterality: Right;  . RADIOACTIVE SEED GUIDED PARTIAL MASTECTOMY WITH AXILLARY SENTINEL LYMPH NODE BIOPSY Right 06/15/2015   Procedure: RADIOACTIVE SEED GUIDED PARTIAL MASTECTOMY WITH AXILLARY SENTINEL LYMPH NODE BIOPSY;  Surgeon: Rolm Bookbinder, MD;  Location: [place];  Service: General;  Laterality: Right;  . REDUCTION MAMMAPLASTY Bilateral 2017    There were no vitals filed for this visit.   Subjective Assessment - 10/06/20 1107    Subjective Its a little achy from the rain    Pertinent History history of Rt lumpectomy in 2017 with 0/6 lymph nodes positive.  Completed radiation. Another left sided breast reduction coming up with Dr. Iran Planas.    Currently in Pain? Yes    Pain Score 3     Pain Location Chest    Pain Orientation Right    Pain Descriptors / Indicators Aching    Pain Type Chronic pain    Pain Onset More than a month ago    Pain Frequency Intermittent  [facility] Adult PT Treatment/Exercise - 10/06/20 0001      Shoulder Exercises: Standing   Extension Strengthening;Both;15 reps;Theraband    Theraband Level (Shoulder Extension) Level 2 (Red)    Row Both;15 reps    Theraband Level (Shoulder Row) Level 2 (Red)    Other Standing Exercises Bil UE 3 way raises with 2#, standing with back against wall/core engaged, shoulders and head against wall x10 each      Shoulder Exercises: Pulleys   Flexion 2 minutes    ABduction 2 minutes      Shoulder Exercises: Therapy Ball   Flexion Both;10 reps    ABduction Right;10 reps      Manual Therapy   Soft  tissue mobilization to Rt lateral trunk, anterior shoulder, latissimus in arm overhead    Passive ROM to the Rt shoulder into flexion and abduction, and D2 as tolerated                       PT Long Term Goals - 09/17/20 1037      PT LONG TERM GOAL #1   Title Pt will report ease of reaching in the back seat of the car improved by at least 50%    Baseline 10-15% better    Time 5    Period Weeks    Status On-going    Target Date 10/22/20      PT LONG TERM GOAL #2   Title Pt will be able to do hair without increased pain over 2/10 in the Rt shoulder    Baseline 4/10 now    Time 5    Period Weeks    Status On-going    Target Date 10/22/20      PT LONG TERM GOAL #3   Title Pt will demonstrate correct use of compression garments and lymphedema pump to manage chronic lymphedema    Baseline has garments but still trying to upgrade pump    Time 5    Period Weeks    Status On-going    Target Date 10/22/20      PT LONG TERM GOAL #4   Title Pt will decrese QDASH to less than 53%    Baseline 50%    Time 8    Period Weeks    Status Achieved                 Plan - 10/06/20 1204    Clinical Impression Statement Pt is making progress overall; tolerating PROM and STM with more intensity than when this PT last treated her.  Discussed that the intermittent axillary swelling and upper arm swelling seems to be the biggest problem currently.  Still waiting for new flexitouch piece and we discussed measuring for a new compression sleeve next time.    PT Treatment/Interventions ADLs/Self Care Home Management;Therapeutic exercise;Patient/family education;Manual techniques;Manual lymph drainage;Taping;Passive range of motion;Dry needling    PT Next Visit Plan help pt measure for Rt UE compression sleeve and give ordering options as pt has medicare coverage only. may be interested in color sleeve which would have to go through [facility]?  Cont STM and manual therapy prn to Rt upper  quadrant; cont postural and UE strength; cont kinesiotape prn and progress HEP prn    Consulted and Agree with Plan of Care Patient           Patient will benefit from skilled therapeutic intervention in order to improve the following deficits and impairments:     Visit Diagnosis:  Lymphedema  Stiffness of right shoulder, not elsewhere classified  Chronic right shoulder pain  Abnormal posture  Lymphedema, not elsewhere classified  Muscle weakness (generalized)     Problem List Patient Active Problem List   Diagnosis Date Noted  . Atypical glandular cells on cervical Pap smear on 12/31/18 02/22/2019  . Port catheter in place 12/28/2015  . Chemotherapy induced neutropenia ([place]) 05/14/2015  . Chemotherapy-induced neuropathy ([place]) 04/08/2015  . 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 Tooth ache 01/09/2015  . Genetic testing 12/26/2014  . Family history of breast cancer   . Malignant neoplasm of lower-inner quadrant of right breast of female, estrogen receptor negative ([place]) 11/25/2014  . Migraine 11/19/2014    Stark Bray 10/06/2020, 12:08 PM  [place], [place], [address] Phone: [phone]   Fax:  [phone]  Name: Susan Wilson MRN: [id number] Date of Birth: 09/18/1975

## 2020-10-13 ENCOUNTER — Other Ambulatory Visit: Payer: Self-pay

## 2020-10-13 ENCOUNTER — Ambulatory Visit: Payer: Medicare Other | Attending: General Surgery

## 2020-10-13 DIAGNOSIS — I890 Lymphedema, not elsewhere classified: Secondary | ICD-10-CM | POA: Diagnosis not present

## 2020-10-13 DIAGNOSIS — R293 Abnormal posture: Secondary | ICD-10-CM | POA: Insufficient documentation

## 2020-10-13 DIAGNOSIS — M25611 Stiffness of right shoulder, not elsewhere classified: Secondary | ICD-10-CM | POA: Insufficient documentation

## 2020-10-13 DIAGNOSIS — M25511 Pain in right shoulder: Secondary | ICD-10-CM | POA: Diagnosis not present

## 2020-10-13 DIAGNOSIS — G8929 Other chronic pain: Secondary | ICD-10-CM | POA: Insufficient documentation

## 2020-10-13 NOTE — Therapy (Addendum)
Progress Note Reporting Period 06/11/20 to 10/13/20  See note below for Objective Data and Assessment of Progress/Goals.       [place], [place], [address] Phone: [phone]   Fax:  [phone]  Physical Therapy Treatment  Patient Details  Name: Susan Wilson MRN: [id number] Date of Birth: 01-30-1976 Referring Provider (PT): Dr. Donne Hazel   Encounter Date: 10/13/2020   PT End of Session - 10/13/20 1114    Visit Number 10    Number of Visits 12    Date for PT Re-Evaluation 10/22/20    Authorization Type medicare    Progress Note Due on Visit 10    PT Start Time 1106    PT Stop Time 1204    PT Time Calculation (min) 58 min    Activity Tolerance Patient tolerated treatment well    Behavior During Therapy [facility] for tasks assessed/performed           Past Medical History:  Diagnosis Date  . Anemia   . Breast asymmetry 12/2015  . Breast cancer ([place]) 2017   right breast  . History of breast cancer 11/2014   right  . History of chemotherapy   . Migraines   . 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 Personal history of chemotherapy 2017  . Personal history of radiation therapy 2017    Past Surgical History:  Procedure Laterality Date  . BREAST LUMPECTOMY Right 2017  . BREAST RECONSTRUCTION Right 06/23/2015   Procedure: ONCOPLASTIC RIGHT BREAST REDUCTION;  Surgeon: Irene Limbo, MD;  Location: [place];  Service: Plastics;  Laterality: Right;  . BREAST REDUCTION SURGERY Left 06/23/2015   Procedure: MAMMARY REDUCTION  (BREAST) LEFT BREAST FOR SYMETRY (BILATERAL BREAST REDUCTION);  Surgeon: Irene Limbo, MD;  Location: [place];  Service: Plastics;  Laterality: Left;  . BREAST REDUCTION SURGERY Left 01/15/2016   Procedure: MAMMARY REDUCTION  (BREAST) LEFT;  Surgeon: Irene Limbo, MD;  Location: [place];   Service: Plastics;  Laterality: Left;  Marland Kitchen MASTOPEXY Left 01/15/2016   Procedure: POSSIBLE LEFT MASTOPEXY;  Surgeon: Irene Limbo, MD;  Location: [place];  Service: Plastics;  Laterality: Left;  . PORT-A-CATH REMOVAL Right 01/15/2016   Procedure: REMOVAL OF RIGHT PORT-A-CATH;  Surgeon: Irene Limbo, MD;  Location: [place];  Service: Plastics;  Laterality: Right;  . PORTACATH PLACEMENT Right 12/16/2014   Procedure: INSERTION PORT-A-CATH WITH ULTRASOUND;  Surgeon: Rolm Bookbinder, MD;  Location: [place];  Service: General;  Laterality: Right;  . RADIOACTIVE SEED GUIDED PARTIAL MASTECTOMY WITH AXILLARY SENTINEL LYMPH NODE BIOPSY Right 06/15/2015   Procedure: RADIOACTIVE SEED GUIDED PARTIAL MASTECTOMY WITH AXILLARY SENTINEL LYMPH NODE BIOPSY;  Surgeon: Rolm Bookbinder, MD;  Location: [place];  Service: General;  Laterality: Right;  . REDUCTION MAMMAPLASTY Bilateral 2017    There were no vitals filed for this visit.   Subjective Assessment - 10/13/20 1111    Subjective I feel much better after last week. I was swollen and everything last week, but I feel much better today.    Pertinent History history of Rt lumpectomy in 2017 with 0/6 lymph nodes positive.  Completed radiation. Another left sided breast reduction coming up with Dr. Iran Planas.    Patient Stated Goals see if there is anything else that will help    Currently in Pain? No/denies  [facility] Adult PT Treatment/Exercise - 10/13/20 0001      Shoulder Exercises: Standing   Extension Strengthening;Both;20 reps;Theraband   2x10   Theraband Level (Shoulder Extension) Level 2 (Red)    Row Strengthening;Both;20 reps;Theraband   2x10   Theraband Level (Shoulder Row) Level 2 (Red)      Shoulder Exercises: Pulleys   Flexion 2 minutes    ABduction 2 minutes      Shoulder Exercises: Therapy Ball   Flexion Both;10 reps   forward lean into end  of stretch; 1# added on wrist   ABduction Right;10 reps   1# on wrist     Manual Therapy   Edema Management Measured pt for a custom flat knit sleeve and glove, measurements will be sent to [facility]    Soft tissue mobilization to Rt lateral trunk, anterior shoulder, latissimus in arm overhead    Passive ROM to the Rt shoulder into flexion and abduction, and D2 as tolerated                       PT Long Term Goals - 10/13/20 1207      PT LONG TERM GOAL #1   Title Pt will report ease of reaching in the back seat of the car improved by at least 50%    Baseline 10-15% better overall but more so on "good days" when pain is less-10/13/20    Status On-going      PT LONG TERM GOAL #2   Title Pt will be able to do hair without increased pain over 2/10 in the Rt shoulder    Baseline 4/10 now; pt with no pain today but this fluctuates (pt reports stormy weather is largest contributing factor to increased pain)-10/13/20    Status Partially Met      PT LONG TERM GOAL #3   Title Pt will demonstrate correct use of compression garments and lymphedema pump to manage chronic lymphedema    Baseline has garments but still trying to upgrade pump piece, she was measured for flat knits garments today so beginning ordering process-10/13/20    Status On-going      PT LONG TERM GOAL #4   Title Pt will decrese QDASH to less than 53%    Baseline 50%    Status Achieved                 Plan - 10/13/20 1115    Clinical Impression Statement Pt reports feeling much improved since last session as weather was stormy last week and she reports this aggravates her symptoms. She was able to tolerate all UE/postural strength without increased discomfort. Continued with maual therapy woking to decrease Rt lateral trunk tightness though this much improved today from when this therapist saw her last. Measured pt for a custom flat knit sleeve and glove and pt is agreeable to her information being sent to  [facility] for ordering to see if her [facility] secondary will cover garments at all.    Personal Factors and Comorbidities Comorbidity 2;Past/Current Experience;Time since onset of injury/illness/exacerbation    Comorbidities lymphedema, radiation history    Examination-Activity Limitations Lift;Carry    Examination-Participation Restrictions Cleaning    Stability/Clinical Decision Making Stable/Uncomplicated    Rehab Potential Good    PT Frequency 1x / week    PT Duration Other (comment)   5 weeks   PT Treatment/Interventions ADLs/Self Care Home Management;Therapeutic exercise;Patient/family education;Manual techniques;Manual lymph drainage;Taping;Passive range of motion;Dry needling    PT Next Visit  Plan Did pt hear from [facility]? Cont STM and manual therapy prn to Rt upper quadrant; cont postural and UE strength; cont kinesiotape prn and progress HEP prn    PT Home Exercise Plan Standing bil UE 3 way raises and OH presses; supine scapular series with red therband    Recommended Other Services Sent demographics to [facility] for pt to order compression garments thru    Consulted and Agree with Plan of Care Patient           Patient will benefit from skilled therapeutic intervention in order to improve the following deficits and impairments:  Pain,Postural dysfunction,Decreased range of motion,Impaired UE functional use,Increased edema  Visit Diagnosis: Lymphedema  Stiffness of right shoulder, not elsewhere classified  Chronic right shoulder pain  Abnormal posture     Problem List Patient Active Problem List   Diagnosis Date Noted  . Atypical glandular cells on cervical Pap smear on 12/31/18 02/22/2019  . Port catheter in place 12/28/2015  . Chemotherapy induced neutropenia ([place]) 05/14/2015  . 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 Tooth ache 01/09/2015  . Genetic testing 12/26/2014  . Family history of breast cancer   . Malignant  neoplasm of lower-inner quadrant of right breast of female, estrogen receptor negative ([place]) 11/25/2014  . Migraine 11/19/2014    Otelia Limes, PTA 10/13/2020, 12:14 PM  [place], [place], [address] Phone: [phone]   Fax:  [phone]  Name: Susan Wilson MRN: [id number] Date of Birth: Oct 22, 1975

## 2020-10-15 ENCOUNTER — Telehealth: Payer: Self-pay | Admitting: *Deleted

## 2020-10-15 NOTE — Telephone Encounter (Addendum)
10/14/2020 Return call received from [place] to confirm this nurse requested confirmation of leave start date ("07/30/2020"); end date ("01/19/2021"), estimated return to work date ("three (3) to six (6) month estimate").  "Requesting to extend initial continuous leave expected to end 09/03/2020.  [facility] is 10/20/2020.  Measured today for a Flexitouch Machine that needs to be ordered.  Scheduled with surgeon Dr. Donne Hazel on 11/04/2020.  If surgery is scheduled early July, the rest of July and all of August will be needed for recovery.  I remember having drains the first two weeks after surgery.  E-mail a copy of form to my address: [email or web address]."     10/08/2020 Located and printed "[place]" form to complete.  Collaborative out of office.   09/30/2020 "[place] [phone]).  When can I expect my extension form?  They are pressing me."  10/05/2020 will ask collaborative 10/06/2020.  See 5/20/222 telephone encounter  09/18/2020 "Nisreen S Costilow [phone]) calling for status check of form documentation." to 6 month

## 2020-10-19 NOTE — Telephone Encounter (Signed)
10/19/2020 received copy of fax [place] Disability and leave form returned 10/01/2020 during this nurse absence 09/24/2020 through 10/05/2020.  This nurse returned form 10/14/2020.    Initial request without a return to work date possibly incomplete per [name].  Second request by this nurse listed September 2022 estimate pending surgery.

## 2020-10-20 ENCOUNTER — Other Ambulatory Visit: Payer: Self-pay

## 2020-10-20 ENCOUNTER — Encounter: Payer: Self-pay | Admitting: Rehabilitation

## 2020-10-20 ENCOUNTER — Ambulatory Visit: Payer: Medicare Other | Attending: General Surgery | Admitting: Rehabilitation

## 2020-10-20 ENCOUNTER — Telehealth: Payer: Self-pay | Admitting: *Deleted

## 2020-10-20 DIAGNOSIS — I890 Lymphedema, not elsewhere classified: Secondary | ICD-10-CM | POA: Insufficient documentation

## 2020-10-20 DIAGNOSIS — M25511 Pain in right shoulder: Secondary | ICD-10-CM | POA: Diagnosis not present

## 2020-10-20 DIAGNOSIS — M6281 Muscle weakness (generalized): Secondary | ICD-10-CM | POA: Diagnosis not present

## 2020-10-20 DIAGNOSIS — R293 Abnormal posture: Secondary | ICD-10-CM | POA: Diagnosis not present

## 2020-10-20 DIAGNOSIS — M25611 Stiffness of right shoulder, not elsewhere classified: Secondary | ICD-10-CM | POA: Diagnosis not present

## 2020-10-20 DIAGNOSIS — G8929 Other chronic pain: Secondary | ICD-10-CM | POA: Insufficient documentation

## 2020-10-20 NOTE — Therapy (Signed)
[place], [place], [address] Phone: [phone]   Fax:  [phone]  Physical Therapy Treatment  Patient Details  Name: Susan Wilson MRN: [id number] Date of Birth: 1975/08/01 Referring Provider (PT): Dr. Donne Hazel   Encounter Date: 10/20/2020   PT End of Session - 10/20/20 1201    Visit Number 11    Number of Visits 19    Date for PT Re-Evaluation 11/17/20    Progress Note Due on Visit 21    PT Start Time 1002    PT Stop Time 1059    PT Time Calculation (min) 57 min    Activity Tolerance Patient tolerated treatment well    Behavior During Therapy [facility] for tasks assessed/performed           Past Medical History:  Diagnosis Date  . Anemia   . Breast asymmetry 12/2015  . Breast cancer ([place]) 2017   right breast  . History of breast cancer 11/2014   right  . History of chemotherapy   . Migraines   . 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 Personal history of chemotherapy 2017  . Personal history of radiation therapy 2017    Past Surgical History:  Procedure Laterality Date  . BREAST LUMPECTOMY Right 2017  . BREAST RECONSTRUCTION Right 06/23/2015   Procedure: ONCOPLASTIC RIGHT BREAST REDUCTION;  Surgeon: Irene Limbo, MD;  Location: [place];  Service: Plastics;  Laterality: Right;  . BREAST REDUCTION SURGERY Left 06/23/2015   Procedure: MAMMARY REDUCTION  (BREAST) LEFT BREAST FOR SYMETRY (BILATERAL BREAST REDUCTION);  Surgeon: Irene Limbo, MD;  Location: [place];  Service: Plastics;  Laterality: Left;  . BREAST REDUCTION SURGERY Left 01/15/2016   Procedure: MAMMARY REDUCTION  (BREAST) LEFT;  Surgeon: Irene Limbo, MD;  Location: [place];  Service: Plastics;  Laterality: Left;  Marland Kitchen MASTOPEXY Left 01/15/2016   Procedure: POSSIBLE LEFT MASTOPEXY;  Surgeon: Irene Limbo, MD;  Location: [place];  Service: Plastics;  Laterality: Left;  . PORT-A-CATH REMOVAL Right 01/15/2016   Procedure: REMOVAL OF RIGHT PORT-A-CATH;  Surgeon: Irene Limbo, MD;  Location: [place];  Service: Plastics;  Laterality: Right;  . PORTACATH PLACEMENT Right 12/16/2014   Procedure: INSERTION PORT-A-CATH WITH ULTRASOUND;  Surgeon: Rolm Bookbinder, MD;  Location: [place];  Service: General;  Laterality: Right;  . RADIOACTIVE SEED GUIDED PARTIAL MASTECTOMY WITH AXILLARY SENTINEL LYMPH NODE BIOPSY Right 06/15/2015   Procedure: RADIOACTIVE SEED GUIDED PARTIAL MASTECTOMY WITH AXILLARY SENTINEL LYMPH NODE BIOPSY;  Surgeon: Rolm Bookbinder, MD;  Location: [place];  Service: General;  Laterality: Right;  . REDUCTION MAMMAPLASTY Bilateral 2017    There were no vitals filed for this visit.   Subjective Assessment - 10/20/20 1004    Subjective I haven't heard anything back from [facility].  I can tell it is better.  I could maybe do a few more    Pertinent History history of Rt lumpectomy in 2017 with 0/6 lymph nodes positive.  Completed radiation. Another left sided breast reduction coming up with Dr. Iran Planas.    Patient Stated Goals see if there is anything else that will help    Currently in Pain? Yes    Pain Score 3     Pain Location Axilla    Pain Orientation Right    Pain Descriptors / Indicators Aching    Pain Type Chronic pain  [facility] PT Assessment - 10/20/20 0001      AROM   Right Shoulder Extension 40 Degrees   pain   Right Shoulder Flexion 160 Degrees   pain   Right Shoulder ABduction 170 Degrees    Right Shoulder Internal Rotation --   Rt: T4,  Lt: to T9 with pain   Right Shoulder External Rotation 102 Degrees   pain                        [facility] Adult PT Treatment/Exercise - 10/20/20 0001      Shoulder Exercises: Pulleys   Flexion 2 minutes      Shoulder Exercises: Stretch   Other Shoulder Stretches IR stretch with  pulleys but not pulling up as pt feels stretch just holding the hand at the sacrum 2x20"    Other Shoulder Stretches wall chest stretch single arm 2x20" , cross body stretch x 20"      Manual Therapy   Manual Therapy Manual Lymphatic Drainage (MLD)    Manual therapy comments remeasured AROM and assessed goals due to POC ending    Manual Lymphatic Drainage (MLD) to assist with trunk clearance that basic pump does not help: short neck, superficial and deep abdominals, bil axillary nodes, Rt inguinal nodes, anterior interaxillary pathway and Rt axillo inguinal pathway and then focus on Rt axilla, lateral breast, then reversing steps    Passive ROM to the Rt shoulder into flexion and abduction, and D2 as tolerated      Kinesiotix   Facilitate Muscle  one 1 strip anterior shoulder towards scapula 50% with retraction held applied after skinkote                       PT Long Term Goals - 10/20/20 1014      PT LONG TERM GOAL #1   Title Pt will report ease of reaching in the back seat of the car improved by at least 50%    Baseline 10-15% better overall but more so on "good days" when pain is less-10/13/20    Time 4    Period Weeks    Status On-going      PT LONG TERM GOAL #2   Title Pt will be able to do hair without increased pain over 2/10 in the Rt shoulder    Baseline 4/10-6/10    Time 4    Period Weeks    Status On-going      PT LONG TERM GOAL #3   Title Pt will demonstrate correct use of compression garments and lymphedema pump to manage chronic lymphedema    Baseline has garments but still trying to upgrade pump piece, she was measured for flat knits garments today so beginning ordering process-10/13/20    Time 4    Period Weeks    Status On-going      PT LONG TERM GOAL #4   Title Pt will decrese QDASH to less than 53%    Baseline 50%    Status Achieved                 Plan - 10/20/20 1202    Clinical Impression Statement Reviewed goals today which are  still unmet due to continued pain in the arm and shoulder but overall pt is much improved in terms of stiffness, tightness in the Rt upper quadrant and feeling looser.  Pt is found to have the most pain with IR and extension today  and limited behind the back reach as well as continued proximal trunk congestion due to pump being basic only.  Pt is still working on the pump upgrade.  Sent benefits to [employer] to check.  Will extend POC for 8 visits.    Personal Factors and Comorbidities Comorbidity 2;Past/Current Experience;Time since onset of injury/illness/exacerbation    Comorbidities lymphedema, radiation history    Examination-Activity Limitations Lift;Carry    Examination-Participation Restrictions Cleaning;Community Activity;Yard Work;Occupation    Stability/Clinical Decision Making Stable/Uncomplicated    Clinical Decision Making Low    PT Frequency 1x / week    PT Duration 4 weeks    PT Treatment/Interventions ADLs/Self Care Home Management;Therapeutic exercise;Patient/family education;Manual techniques;Manual lymph drainage;Taping;Passive range of motion;Dry needling    PT Next Visit Plan [employer] benefits back? need alight vs coverage? finish custom measurements and send if needed.  Rt upper quadrant decongestion with MLD and STM; cont postural and UE strength; cont kinesiotape prn and progress HEP prn,  add some IR work Dispensing optician open book    Oncologist with Plan of Care Patient           Patient will benefit from skilled therapeutic intervention in order to improve the following deficits and impairments:  Pain,Postural dysfunction,Decreased range of motion,Impaired UE functional use,Increased edema  Visit Diagnosis: Lymphedema  Stiffness of right shoulder, not elsewhere classified  Chronic right shoulder pain  Abnormal posture  Lymphedema, not elsewhere classified     Problem List Patient Active Problem List   Diagnosis Date Noted  . Atypical glandular cells on  cervical Pap smear on 12/31/18 02/22/2019  . Port catheter in place 12/28/2015  . Chemotherapy induced neutropenia ([place]) 05/14/2015  . 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 Tooth ache 01/09/2015  . Genetic testing 12/26/2014  . Family history of breast cancer   . Malignant neoplasm of lower-inner quadrant of right breast of female, estrogen receptor negative ([place]) 11/25/2014  . Migraine 11/19/2014    Stark Bray 10/20/2020, 12:08 PM  [place], [place], [address] Phone: [phone]   Fax:  [phone]  Name: JADIN KAGEL MRN: [id number] Date of Birth: 12/27/1975

## 2020-10-20 NOTE — Telephone Encounter (Signed)
This RN spoke with pt per her VM stating need for refills for migraine and antinausea medications.  Note MD had ordered Imitrex last year for pt while under active chemo - she was using zofran per chemotherapy protocol.  MD refilled per courtesy last year.  Pt has completed chemo as of 2016.  This RN discussed above- and need for best follow up with her primary MD - Dylanie states she "doesn't really have one since you guys were refilling for me "  This RN discussed need to see primary MD due to general life health needs as in cases like above for best outcome.  Graclyn will follow up with a primary MD.

## 2020-10-27 ENCOUNTER — Encounter (HOSPITAL_COMMUNITY): Payer: Self-pay

## 2020-10-27 ENCOUNTER — Other Ambulatory Visit: Payer: Self-pay

## 2020-10-27 ENCOUNTER — Emergency Department (HOSPITAL_COMMUNITY)
Admission: EM | Admit: 2020-10-27 | Discharge: 2020-10-27 | Disposition: A | Discharge status: Home / Self Care | Payer: Medicare Other | Attending: Emergency Medicine | Admitting: Emergency Medicine

## 2020-10-27 DIAGNOSIS — J069 Acute upper respiratory infection, unspecified: Secondary | ICD-10-CM | POA: Insufficient documentation

## 2020-10-27 DIAGNOSIS — B9789 Other viral agents as the cause of diseases classified elsewhere: Secondary | ICD-10-CM | POA: Diagnosis not present

## 2020-10-27 DIAGNOSIS — Z853 Personal history of malignant neoplasm of breast: Secondary | ICD-10-CM | POA: Insufficient documentation

## 2020-10-27 DIAGNOSIS — R059 Cough, unspecified: Secondary | ICD-10-CM | POA: Diagnosis present

## 2020-10-27 DIAGNOSIS — Z20822 Contact with and (suspected) exposure to covid-19: Secondary | ICD-10-CM | POA: Insufficient documentation

## 2020-10-27 LAB — GROUP A STREP BY PCR: Group A Strep by PCR: NOT DETECTED

## 2020-10-27 LAB — RESP PANEL BY RT-PCR (FLU A&B, COVID) ARPGX2
Influenza A by PCR: NEGATIVE
Influenza B by PCR: NEGATIVE
SARS Coronavirus 2 by RT PCR: NEGATIVE

## 2020-10-27 MED ORDER — IBUPROFEN 800 MG PO TABS
800.0000 mg | ORAL_TABLET | Freq: Once | ORAL | Status: AC
  Administered 2020-10-27: 800 mg via ORAL
  Filled 2020-10-27: qty 1

## 2020-10-27 NOTE — ED Provider Notes (Signed)
Emergency Medicine Provider Triage Evaluation Note  Susan Wilson , a 45 y.o. female  was evaluated in triage.  Pt complains of sore throat, pain with swallowing, fever with body aches and congestion. Has not had COVID or flu vaccines.  Review of Systems  Positive: Sore throat, body aches, congestion Negative: vomiting  Physical Exam  BP (!) 141/94 (BP Location: Left Arm)   Pulse 99   Temp (!) 100.4 F (38 C) (Oral)   Resp 16   Ht 5' 5"  (1.651 m)   Wt 96.6 kg   LMP 09/20/2020 (Exact Date)   SpO2 100%   BMI 35.45 kg/m  Gen:   Awake, no distress   Resp:  Normal effort  MSK:   Moves extremities without difficulty  Other:  Exudate on tonsils   Medical Decision Making  Medically screening exam initiated at 8:02 PM.  Appropriate orders placed.  Susan Wilson was informed that the remainder of the evaluation will be completed by another provider, this initial triage assessment does not replace that evaluation, and the importance of remaining in the ED until their evaluation is complete.     Tacy Learn, PA-C 10/27/20 2004    Lacretia Leigh, MD 10/30/20 [phone]

## 2020-10-27 NOTE — Discharge Instructions (Addendum)
You were negative for strep pharyngitis.  I suspect that your symptoms are reflective of viral infection, possibly COVID-19 or influenza.   Please maintain isolation precautions pending results of your testing.  Even if they are negative, I have a high degree of suspicion for viral respiratory infection.  I recommend over-the-counter medications such as DayQuil and NyQuil.  Please check your temperature regularly take Tylenol or ibuprofen as needed for fever control.  Be sure that you are drinking plenty of fluids and eating meals regularly.  If your symptoms fail to improve with conservative treatment, I would like for you to reach out to your primary care provider, Dr. Jana Hakim, for ongoing evaluation including without limited to chest x-ray and labs.

## 2020-10-27 NOTE — ED Provider Notes (Signed)
[facility] DEPT Provider Note   CSN: [id number] Arrival date & time: 10/27/20  1859     History Chief Complaint  Patient presents with   Sore Throat    Susan Wilson is a 45 y.o. female with past medical history significant for right-sided breast cancer s/p lumpectomy and reconstruction no longer on chemotherapy who presents to the ED with 3-day of cold symptoms.  On my examination, patient reports that she has been experiencing fevers and chills, headache, congestion, sore throat, mild nausea, diminished appetite, and nonproductive cough.  She lives with her husband and children, nobody else in the home has been ill.  She states that she feels generalized weakness, reminding her of her previous chemotherapy.  Dr. Jana Hakim, her oncologist, is functioning as her primary care provider.  She states she is no longer on chemotherapy.  She denies any pleuritic symptoms, chest pain, abdominal pain, emesis, urinary symptoms, or changes in bowel habits.  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/08/2015   Anemia in neoplastic disease 04/02/2015   Tooth ache 01/09/2015   Genetic testing 12/26/2014   Family history of breast cancer    Malignant neoplasm of lower-inner quadrant of right breast of female, estrogen receptor negative ([place]) 11/25/2014   Migraine 11/19/2014    Past  Surgical History:  Procedure Laterality Date   BREAST LUMPECTOMY Right 2017   BREAST RECONSTRUCTION Right 06/23/2015   Procedure: ONCOPLASTIC RIGHT BREAST REDUCTION;  Surgeon: Irene Limbo, MD;  Location: [place];  Service: Plastics;  Laterality: Right;   BREAST REDUCTION SURGERY Left 06/23/2015   Procedure: MAMMARY REDUCTION  (BREAST) LEFT BREAST FOR SYMETRY (BILATERAL BREAST REDUCTION);  Surgeon: Irene Limbo, MD;  Location: [place];  Service: Plastics;  Laterality: Left;   BREAST REDUCTION SURGERY Left 01/15/2016   Procedure: MAMMARY REDUCTION  (BREAST) LEFT;  Surgeon: Irene Limbo, MD;  Location: [place];  Service: Plastics;  Laterality: Left;   MASTOPEXY Left 01/15/2016   Procedure: POSSIBLE LEFT MASTOPEXY;  Surgeon: Irene Limbo, MD;  Location: [place];  Service: Plastics;  Laterality: Left;   PORT-A-CATH REMOVAL Right 01/15/2016   Procedure: REMOVAL OF RIGHT PORT-A-CATH;  Surgeon: Irene Limbo, MD;  Location: [place];  Service: Plastics;  Laterality: Right;   PORTACATH PLACEMENT Right 12/16/2014   Procedure: INSERTION PORT-A-CATH WITH ULTRASOUND;  Surgeon: Rolm Bookbinder, MD;  Location: [place];  Service: General;  Laterality: Right;   RADIOACTIVE SEED GUIDED PARTIAL MASTECTOMY WITH AXILLARY SENTINEL LYMPH NODE BIOPSY Right 06/15/2015   Procedure: RADIOACTIVE SEED GUIDED PARTIAL MASTECTOMY WITH AXILLARY SENTINEL LYMPH NODE BIOPSY;  Surgeon: Rolm Bookbinder, MD;  Location: [place];  Service: General;  Laterality: Right;   REDUCTION MAMMAPLASTY Bilateral 2017     OB History  Gravida  2   Para  2   Term  2   Preterm      AB      Living         SAB      IAB      Ectopic      Multiple      Live Births              Family History  Problem Relation Age of Onset   Mesothelioma Maternal Grandfather        asbestos exposure, died in his 41s    Heart defect Sister 0   Breast cancer Paternal Grandmother    Diabetes Paternal Grandmother    Cancer Paternal Grandfather        NOS   Colon cancer Other 33       MGMs brother with colon cancer   Cancer Other        several of MGF's sisters with cancer NOS   Liver cancer Other        MGF's brother   Heart disease Paternal Aunt    Heart disease Paternal Uncle     Social History   Tobacco Use   Smoking status: Never   Smokeless tobacco: Never  Substance Use Topics   Alcohol use: Yes    Comment: rarely   Drug use: No    Home Medications Prior to Admission medications   Medication Sig Start Date End Date Taking? Authorizing Provider  b complex vitamins tablet Take 1 tablet by mouth daily.    [provider]  cyclobenzaprine (FLEXERIL) 10 MG tablet Take 1 tablet (10 mg total) by mouth 2 (two) times daily as needed for muscle spasms. 10/30/18   Hedges, Dellis Filbert, PA-C  ferrous sulfate 325 (65 FE) MG tablet Take 325 mg by mouth daily with breakfast.    [provider]  LORazepam (ATIVAN) 0.5 MG tablet Take 1 tablet (0.5 mg total) by mouth at bedtime. 06/25/15   Boelter, Genelle Gather, NP  meloxicam (MOBIC) 15 MG tablet Take 1 tablet (15 mg total) by mouth daily. 02/25/20   Magrinat, Virgie Dad, MD  nortriptyline (PAMELOR) 10 MG capsule Start nortriptyline 71m at bedtime for 2 week, then increase to 2 tablet at bedtime 02/25/20   Magrinat, GVirgie Dad MD  ondansetron (ZOFRAN) 4 MG tablet TAKE 1 TABLET BY MOUTH EVERY 8 HOURS AS NEEDED FOR NAUSEA OR VOMITING 05/18/18   Magrinat, GVirgie Dad MD  SUMAtriptan (IMITREX) 50 MG tablet Take 1 tablet (50 mg total) by mouth every 2 (two) hours as needed for migraine. Mayrepeat in 2 hrs if needed. 09/23/19   Magrinat, GVirgie Dad MD  vitamin E (VITAMIN E) 400 UNIT capsule Take 1 capsule (400 Units total) by mouth daily. 07/04/16   CGardenia Phlegm NP    Allergies    Patient has no known allergies.  Review of Systems   Review of  Systems  All other systems reviewed and are negative.  Physical Exam Updated Vital Signs BP (!) 141/94 (BP Location: Left Arm)   Pulse 99   Temp (!) 100.4 F (38 C) (Oral)   Resp 16   Ht 5' 5"  (1.651 m)   Wt 96.6 kg   LMP 09/20/2020 (Exact Date)   SpO2 100%   BMI 35.45 kg/m   Physical Exam Vitals and nursing note reviewed. Exam conducted with a chaperone present.  Constitutional:      General: She is not in acute distress.  Appearance: Normal appearance. She is not toxic-appearing.  HENT:     Head: Normocephalic and atraumatic.     Mouth/Throat:     Pharynx: Oropharyngeal exudate and posterior oropharyngeal erythema present.     Comments: Patent oropharynx.  Mild tonsillar hypertrophy bilaterally.  Exudates noted.  No uvular deviation.  No asymmetries.  No tongue swelling or floor of mouth induration.  No trismus.  Tolerating secretions well. Eyes:     General: No scleral icterus.    Conjunctiva/sclera: Conjunctivae normal.  Cardiovascular:     Rate and Rhythm: Normal rate and regular rhythm.     Pulses: Normal pulses.  Pulmonary:     Effort: Pulmonary effort is normal. No respiratory distress.     Breath sounds: Normal breath sounds. No wheezing or rales.     Comments: CTA bilaterally.  No increased work of breathing. Abdominal:     General: Abdomen is flat. There is no distension.  Musculoskeletal:     Cervical back: Normal range of motion. No rigidity.  Skin:    General: Skin is dry.  Neurological:     Mental Status: She is alert and oriented to person, place, and time.     GCS: GCS eye subscore is 4. GCS verbal subscore is 5. GCS motor subscore is 6.  Psychiatric:        Mood and Affect: Mood normal.        Behavior: Behavior normal.        Thought Content: Thought content normal.    ED Results / Procedures / Treatments   Labs (all labs ordered are listed, but only abnormal results are displayed) Labs Reviewed  GROUP A STREP BY PCR  RESP PANEL BY RT-PCR  (FLU A&B, COVID) [id number]    EKG None  Radiology No results found.  Procedures Procedures   Medications Ordered in ED Medications  ibuprofen (ADVIL) tablet 800 mg (has no administration in time range)    ED Course  I have reviewed the triage vital signs and the nursing notes.  Pertinent labs & imaging results that were available during my care of the patient were reviewed by me and considered in my medical decision making (see chart for details).    MDM Rules/Calculators/A&P                          Susan Wilson was evaluated in Emergency Department on 10/27/2020 for the symptoms described in the history of present illness. She was evaluated in the context of the global COVID-19 pandemic, which necessitated consideration that the patient might be at risk for infection with the SARS-CoV-2 virus that causes COVID-19. Institutional protocols and algorithms that pertain to the evaluation of patients at risk for COVID-19 are in a state of rapid change based on information released by regulatory bodies including the CDC and federal and state organizations. These policies and algorithms were followed during the patient's care in the ED.  I personally reviewed patient's medical chart and all notes from triage and staff during today's encounter. I have also ordered and reviewed all labs and imaging that I felt to be medically necessary in the evaluation of this patient's complaints and with consideration of their physical exam. If needed, translation services were available and utilized.   Patient with a 3-day history of cold symptoms consistent with viral URI.  Group A strep by PCR and COVID-19/influenza testing has been ordered and collected.  If group A strep by PCR is  positive, will treat with Augmentin.  Otherwise, will discharge home with conservative management.  Given brevity of illness and reassuring physical exam, do not feel as though imaging of chest is warranted. Their symptoms  are likely of viral etiology and we discussed that antibiotics are not indicated for viral infections.  Patient will be discharged with symptomatic treatment.  Patient is tolerating food and liquid without difficulty and I do not believe that laboratory work-up would yield any significant findings.  Low suspicion for electrolyte derangement, but emphasized the importance of eating regularly.  I also emphasized the importance of rest, continued oral hydration, and antipyretics as needed for fever control.    Suspect that symptoms will begin to improve.  If she is negative for COVID-19 and influenza and her symptoms continue to progress or fail to improve, she will likely require further work-up including but not limited to labs and chest x-ray.  They were provided opportunity to ask any additional questions and have none at this time.  Prior to discharge patient is feeling well, agreeable with plan for discharge home.  They have expressed understanding of verbal discharge instructions as well as return precautions and are agreeable to the plan.    Final Clinical Impression(s) / ED Diagnoses Final diagnoses:  Viral URI with cough    Rx / DC Orders ED Discharge Orders     None        Reita Chard 10/27/20 2220    Lacretia Leigh, MD 10/29/20 2257

## 2020-10-27 NOTE — ED Triage Notes (Signed)
Pt c/o sore throat, HA, fever and fatigue since Saturday night. Tried mucinex without relief. Pt states she is currently in remission from breast cancer

## 2020-11-12 ENCOUNTER — Ambulatory Visit: Payer: Medicare Other | Admitting: Rehabilitation

## 2020-11-12 ENCOUNTER — Other Ambulatory Visit: Payer: Self-pay

## 2020-11-12 DIAGNOSIS — M25611 Stiffness of right shoulder, not elsewhere classified: Secondary | ICD-10-CM

## 2020-11-12 DIAGNOSIS — G8929 Other chronic pain: Secondary | ICD-10-CM | POA: Diagnosis not present

## 2020-11-12 DIAGNOSIS — M6281 Muscle weakness (generalized): Secondary | ICD-10-CM | POA: Diagnosis not present

## 2020-11-12 DIAGNOSIS — I890 Lymphedema, not elsewhere classified: Secondary | ICD-10-CM | POA: Diagnosis not present

## 2020-11-12 DIAGNOSIS — R293 Abnormal posture: Secondary | ICD-10-CM

## 2020-11-12 DIAGNOSIS — M25511 Pain in right shoulder: Secondary | ICD-10-CM | POA: Diagnosis not present

## 2020-11-12 NOTE — Therapy (Signed)
[place], [place], [address] Phone: [phone]   Fax:  [phone]  Physical Therapy Treatment  Patient Details  Name: Susan Wilson MRN: [id number] Date of Birth: 1975-12-19 Referring Provider (PT): Dr. Donne Hazel   Encounter Date: 11/12/2020   PT End of Session - 11/12/20 1512     Visit Number 12    Number of Visits 19    Date for PT Re-Evaluation 11/17/20    PT Start Time 1102    PT Stop Time 1751    PT Time Calculation (min) 56 min    Activity Tolerance Patient tolerated treatment well    Behavior During Therapy [facility] for tasks assessed/performed             Past Medical History:  Diagnosis Date   Anemia    Breast asymmetry 12/2015   Breast cancer ([place]) 2017   right breast   History of breast cancer 11/2014   right   History of chemotherapy    Migraines    Neuropathy due to chemotherapeutic drug ([place])    fingers and toes   Personal history of chemotherapy 2017   Personal history of radiation therapy 2017    Past Surgical History:  Procedure Laterality Date   BREAST LUMPECTOMY Right 2017   BREAST RECONSTRUCTION Right 06/23/2015   Procedure: ONCOPLASTIC RIGHT BREAST REDUCTION;  Surgeon: Irene Limbo, MD;  Location: [place];  Service: Plastics;  Laterality: Right;   BREAST REDUCTION SURGERY Left 06/23/2015   Procedure: MAMMARY REDUCTION  (BREAST) LEFT BREAST FOR SYMETRY (BILATERAL BREAST REDUCTION);  Surgeon: Irene Limbo, MD;  Location: [place];  Service: Plastics;  Laterality: Left;   BREAST REDUCTION SURGERY Left 01/15/2016   Procedure: MAMMARY REDUCTION  (BREAST) LEFT;  Surgeon: Irene Limbo, MD;  Location: [place];  Service: Plastics;  Laterality: Left;   MASTOPEXY Left 01/15/2016   Procedure: POSSIBLE LEFT MASTOPEXY;  Surgeon: Irene Limbo, MD;  Location: [place];  Service: Plastics;   Laterality: Left;   PORT-A-CATH REMOVAL Right 01/15/2016   Procedure: REMOVAL OF RIGHT PORT-A-CATH;  Surgeon: Irene Limbo, MD;  Location: [place];  Service: Plastics;  Laterality: Right;   PORTACATH PLACEMENT Right 12/16/2014   Procedure: INSERTION PORT-A-CATH WITH ULTRASOUND;  Surgeon: Rolm Bookbinder, MD;  Location: [place];  Service: General;  Laterality: Right;   RADIOACTIVE SEED GUIDED PARTIAL MASTECTOMY WITH AXILLARY SENTINEL LYMPH NODE BIOPSY Right 06/15/2015   Procedure: RADIOACTIVE SEED GUIDED PARTIAL MASTECTOMY WITH AXILLARY SENTINEL LYMPH NODE BIOPSY;  Surgeon: Rolm Bookbinder, MD;  Location: [place];  Service: General;  Laterality: Right;   REDUCTION MAMMAPLASTY Bilateral 2017    There were no vitals filed for this visit.   Subjective Assessment - 11/12/20 1103     Subjective some more swelling because of the heat    Pertinent History history of Rt lumpectomy in 2017 with 0/6 lymph nodes positive.  Completed radiation. Another left sided breast reduction coming up with Dr. Iran Planas.    Patient Stated Goals see if there is anything else that will help    Currently in Pain? Yes    Pain Score 2     Pain Location Shoulder    Pain Orientation Right    Pain Descriptors / Indicators Aching    Pain Type Chronic pain    Pain Onset More than a month ago    Pain Frequency Intermittent  [place] Adult PT Treatment/Exercise - 11/12/20 0001       Manual Therapy   Edema Management Measured pt for a custom flat knit sleeve and glove, measurements will be sent to [facility]    Manual Lymphatic Drainage (MLD) to assist with trunk clearance that basic pump does not help: short neck, superficial and deep abdominals, bil axillary nodes, Rt inguinal nodes, anterior interaxillary pathway and Rt axillo inguinal pathway and then focus on Rt axilla, lateral breast, then reversing steps    Passive ROM to the Rt  shoulder into flexion and abduction, and D2 as tolerated                         PT Long Term Goals - 10/20/20 1014       PT LONG TERM GOAL #1   Title Pt will report ease of reaching in the back seat of the car improved by at least 50%    Baseline 10-15% better overall but more so on "good days" when pain is less-10/13/20    Time 4    Period Weeks    Status On-going      PT LONG TERM GOAL #2   Title Pt will be able to do hair without increased pain over 2/10 in the Rt shoulder    Baseline 4/10-6/10    Time 4    Period Weeks    Status On-going      PT LONG TERM GOAL #3   Title Pt will demonstrate correct use of compression garments and lymphedema pump to manage chronic lymphedema    Baseline has garments but still trying to upgrade pump piece, she was measured for flat knits garments today so beginning ordering process-10/13/20    Time 4    Period Weeks    Status On-going      PT LONG TERM GOAL #4   Title Pt will decrese QDASH to less than 53%    Baseline 50%    Status Achieved                   Plan - 11/12/20 1512     Clinical Impression Statement Finished measurements for custom [employer] sleeve and glove.  Will send off tomorrow to [facility]  Continued with proximal trunk clearance as well.    PT Frequency 1x / week    PT Duration 4 weeks    PT Treatment/Interventions ADLs/Self Care Home Management;Therapeutic exercise;Patient/family education;Manual techniques;Manual lymph drainage;Taping;Passive range of motion;Dry needling    PT Next Visit Plan Rt upper quadrant decongestion with MLD and STM; cont postural and UE strength; cont kinesiotape prn and progress HEP prn,  add some IR work Dispensing optician open book    Consulted and Agree with Plan of Care Patient             Patient will benefit from skilled therapeutic intervention in order to improve the following deficits and impairments:     Visit Diagnosis: Lymphedema  Stiffness of right  shoulder, not elsewhere classified  Chronic right shoulder pain  Abnormal posture  Lymphedema, not elsewhere classified  Muscle weakness (generalized)     Problem List Patient Active Problem List   Diagnosis Date Noted   Atypical glandular cells on cervical Pap smear on 12/31/18 02/22/2019   Port catheter in place 12/28/2015   Chemotherapy induced neutropenia ([place]) 05/14/2015   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

## 2020-11-25 ENCOUNTER — Other Ambulatory Visit: Payer: Self-pay

## 2020-11-25 ENCOUNTER — Ambulatory Visit: Payer: Self-pay | Attending: General Surgery

## 2020-11-25 DIAGNOSIS — G8929 Other chronic pain: Secondary | ICD-10-CM | POA: Insufficient documentation

## 2020-11-25 DIAGNOSIS — M25511 Pain in right shoulder: Secondary | ICD-10-CM | POA: Insufficient documentation

## 2020-11-25 DIAGNOSIS — R293 Abnormal posture: Secondary | ICD-10-CM | POA: Insufficient documentation

## 2020-11-25 DIAGNOSIS — I890 Lymphedema, not elsewhere classified: Secondary | ICD-10-CM | POA: Insufficient documentation

## 2020-11-25 DIAGNOSIS — M25611 Stiffness of right shoulder, not elsewhere classified: Secondary | ICD-10-CM | POA: Insufficient documentation
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
## 2020-11-30 ENCOUNTER — Encounter: Payer: 59 | Admitting: Rehabilitation

## 2021-02-27 NOTE — Progress Notes (Signed)
[place]  Telephone:(336) [phone] Fax:(336) [phone]     ID: Susan Wilson DOB: November 06, 1975  MR#: [id number]  JYN#:[id number]  Patient Care Team: Chauncey Cruel, MD as PCP - General (Oncology) Rolm Bookbinder, MD as Consulting Physician (General Surgery) Jaxsin Bottomley, Virgie Dad, MD as Consulting Physician (Oncology) [name], RN as Registered Nurse Rockwell Germany, RN as Registered Nurse Alda Berthold, DO as Consulting Physician (Neurology) Irene Limbo, MD as Consulting Physician (Plastic Surgery) OTHER MD:   CHIEF COMPLAINT: Triple negative breast cancer  CURRENT TREATMENT: Observation   INTERVAL HISTORY:  Susan Wilson was scheduledtoday for follow up of her triple negative breast cancer.  However she did not show  Since her last visit, she has not undergone any additional studies. She is due for annual mammography this month.   REVIEW OF SYSTEMS: Susan Wilson    COVID 19 VACCINATION STATUS: refuses vaccination   BREAST CANCER HISTORY: From the original intake note:  [name] herself noted a mass in her right breast sometime around April. Initially she thought it might be related to menstruation, but as it did not change and eventually became tender, she brought it to her physician's attention. On 11/20/2014 patient underwent bilateral diagnostic mammography with tomosynthesis and right breast ultrasonography at the [facility] Center. The breast density was category B. There was a hyperdense mass in the right lower inner quadrant associated with skin thickening. There was also a 5 mm nodule posteriorly at the 8:30 o'clock position in the right breast. There were several hyperdense nodules in the right axilla. On physical exam there was a firm fixed mass in the right breast at the 5:00 position. By ultrasound this was lobulated and appear to involve the skin. It measures up to 4.1 cm. There was no sonographic correlation to the 5 mm nodule seen in a  different area of the right breast. The right axilla showed 3 hypervascular lymph nodes with prominent cortical thickening, measuring less than 1.5 cm.  On 11/21/2014 the patient underwent right breast biopsy (5:00 mass) and biopsy of one of the suspicious right axillary lymph nodes. The pathology (SAA [id number]) showed the breast biopsy to consist of invasive ductal carcinoma, grade 2, estrogen receptor and progesterone receptor negative, with an MIB-1 of 20%, and HER-2 equivocal, with the signals ratio of 1.41, but the average copy number per cell 4.35.  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 toes   Personal history of chemotherapy 2017   Personal history of radiation therapy 2017    PAST SURGICAL HISTORY: Past Surgical History:  Procedure Laterality Date   BREAST LUMPECTOMY Right 2017   BREAST RECONSTRUCTION Right 06/23/2015   Procedure: ONCOPLASTIC RIGHT BREAST REDUCTION;  Surgeon: Irene Limbo, MD;  Location: [facility];  Service: Plastics;  Laterality: Right;   BREAST REDUCTION SURGERY Left 06/23/2015   Procedure: MAMMARY REDUCTION  (BREAST) LEFT BREAST FOR SYMETRY (BILATERAL BREAST REDUCTION);  Surgeon: Irene Limbo, MD;  Location: [facility];  Service: Plastics;  Laterality: Left;   BREAST REDUCTION SURGERY Left 01/15/2016   Procedure: MAMMARY REDUCTION  (BREAST) LEFT;  Surgeon: Irene Limbo, MD;  Location: [facility];  Service: Plastics;  Laterality: Left;   MASTOPEXY Left 01/15/2016   Procedure: POSSIBLE LEFT MASTOPEXY;  Surgeon: Irene Limbo, MD;  Location: [facility];  Service: Plastics;  Laterality: Left;    PORT-A-CATH REMOVAL Right 01/15/2016   Procedure: REMOVAL OF RIGHT PORT-A-CATH;  Surgeon: Irene Limbo, MD;  Location: [facility];  Service: Plastics;  Laterality: Right;   PORTACATH PLACEMENT Right 12/16/2014   Procedure: INSERTION PORT-A-CATH WITH ULTRASOUND;  Surgeon: Rolm Bookbinder, MD;  Location: [facility];  Service: General;  Laterality: Right;   RADIOACTIVE SEED GUIDED PARTIAL MASTECTOMY WITH AXILLARY SENTINEL LYMPH NODE BIOPSY Right 06/15/2015   Procedure: RADIOACTIVE SEED GUIDED PARTIAL MASTECTOMY WITH AXILLARY SENTINEL LYMPH NODE BIOPSY;  Surgeon: Rolm Bookbinder, MD;  Location: [facility];  Service: General;  Laterality: Right;   REDUCTION MAMMAPLASTY Bilateral 2017    FAMILY HISTORY Family History  Problem Relation Age of Onset   Mesothelioma Maternal Grandfather        asbestos exposure, died in his 68s   Heart defect Sister 0   Breast cancer Paternal Grandmother    Diabetes Paternal Grandmother    Cancer Paternal Grandfather        NOS   Colon cancer Other 83       MGMs brother with colon cancer   Cancer Other        several of MGF's sisters with cancer NOS   Liver cancer Other        MGF's brother   Heart disease Paternal Aunt    Heart disease Paternal Uncle   The patient's parents are living, her father being 40 and her mother 8 as of July 2016. The patient had no brothers. One sister died at age 36 from cardiac problems. The other sister is in good health. On the maternal side there is a history of colon cancer and an uncle age 78, liver cancer in a great uncle and mesothelioma in the maternal grandfather.   GYNECOLOGIC HISTORY:  No LMP recorded. Menarche age 61. The patient is GX P2. Her periods were interrupted with chemotherapy, but have resumed (August 2018), although now very scant and brief   SOCIAL HISTORY: (Updated 07/04/2018) Atherton works at [employer]. She formerly worked in Therapist, art for [employer]. Her husband Antrell works for [employer]. The daughters are [place] and Lovie Macadamia, age 90 and 86 as of 06/2018. Arville Lime will be going to college for photography and videography. The patient attends a local [place]: Not in place   HEALTH MAINTENANCE: Social History   Tobacco Use   Smoking status: Never   Smokeless tobacco: Never  Substance Use Topics   Alcohol use: Yes    Comment: rarely   Drug use: No     Colonoscopy: n/a (age)  PAP: 12/2018, HPV+, atypical cells / biopsy negative  Bone density: n/a (age)  Lipid panel:  No Known Allergies  Current Outpatient Medications  Medication Sig Dispense Refill   b complex vitamins tablet Take 1 tablet by mouth daily.     cyclobenzaprine (FLEXERIL) 10 MG tablet Take 1 tablet (10 mg total) by mouth 2 (two) times daily as needed for muscle spasms. 20 tablet 0   ferrous sulfate 325 (65 FE) MG tablet Take 325 mg by mouth daily with breakfast.     LORazepam (ATIVAN) 0.5 MG tablet Take 1 tablet (0.5 mg total) by mouth at bedtime. 30 tablet 0   meloxicam (  MOBIC) 15 MG tablet Take 1 tablet (15 mg total) by mouth daily. 60 tablet 5   nortriptyline (PAMELOR) 10 MG capsule Start nortriptyline 28m at bedtime for 2 week, then increase to 2 tablet at bedtime 60 capsule 5   ondansetron (ZOFRAN) 4 MG tablet TAKE 1 TABLET BY MOUTH EVERY 8 HOURS AS NEEDED FOR NAUSEA OR VOMITING 30 tablet 0   SUMAtriptan (IMITREX) 50 MG tablet Take 1 tablet (50 mg total) by mouth every 2 (two) hours as needed for migraine. Mayrepeat in 2 hrs if needed. 10 tablet 1   vitamin E (VITAMIN E) 400 UNIT capsule Take 1 capsule (400 Units total) by mouth daily. 30 capsule 0   No current facility-administered medications for this visit.    OBJECTIVE: African-American woman who appears stated age There were no vitals filed for this visit.    There is no height or weight on file to calculate BMI.      LAB RESULTS:  CMP  CBC  Latest Ref Rng & Units 07/16/2019 07/04/2018 02/01/2018  WBC 4.0 - 10.5 K/uL 5.6 6.3 5.9  Hemoglobin 12.0 - 15.0 g/dL 12.4 11.5(L) 11.8  Hematocrit 36.0 - 46.0 % 40.1 37.5 37.6  Platelets 150 - 400 K/uL 254 269 263   CMP Latest Ref Rng & Units 07/16/2019 07/04/2018 02/01/2018  Glucose 70 - 99 mg/dL 103(H) 95 87  BUN 6 - 20 mg/dL 10 11 11   Creatinine 0.44 - 1.00 mg/dL 0.74 0.79 0.77  Sodium 135 - 145 mmol/L 137 139 136  Potassium 3.5 - 5.1 mmol/L 4.1 3.7 4.2  Chloride 98 - 111 mmol/L 105 105 103  CO2 22 - 32 mmol/L 23 26 27   Calcium 8.9 - 10.3 mg/dL 8.7(L) 9.0 9.3  Total Protein 6.5 - 8.1 g/dL 7.9 7.8 7.8  Total Bilirubin 0.3 - 1.2 mg/dL 0.7 0.5 0.8  Alkaline Phos 38 - 126 U/L 64 74 74  AST 15 - 41 U/L 17 11(L) 11(L)  ALT 0 - 44 U/L 15 10 11       Urinalysis    Component Value Date/Time   LABSPEC 1.020 05/06/2009 1439   PHURINE 7.0 05/06/2009 1439   GLUCOSEU NEGATIVE 05/06/2009 1439   HGBUR NEGATIVE 05/06/2009 1439   BILIRUBINUR NEGATIVE 05/06/2009 1439   KETONESUR NEGATIVE 05/06/2009 1439   PROTEINUR NEGATIVE 05/06/2009 1439   UROBILINOGEN 0.2 05/06/2009 1439   NITRITE NEGATIVE 05/06/2009 1439   LEUKOCYTESUR  05/06/2009 1439    NEGATIVE Biochemical Testing Only. Please order routine urinalysis from main lab if confirmatory testing is needed.    STUDIES: No results found.   ASSESSMENT: 45y.o. BRCA negative [place] woman s/p Right breast biopsy lower inner quadrant 11/21/2014 for a clinical T2 NX, stage 2 invasive ductal carcinoma, grade 2 or 3, estrogen and progesterone receptor negative, HER-2 equivocal, with an Mib-1 of 20%  (a) biopsy of a suspicious axillary lymph node same day was negative, but discordant  (b) review of 80 additional tumor cells by FISH still showed HER-2 not amplified; tumor should be treated as a triple negative  (1) neoadjuvant chemotherapy started 01/01/2015 consisting of cyclophosphamide and doxorubicin in dose dense fashion 4, completed  02/13/2015, followed by paclitaxel/ carboplatin weekly 12 started 02/26/2015, completed 05/14/2015.   (2) genetics testing 12/15/2014 through the Breast/Ovarian gene panel offered by [facility] found no deleterious mutations in ATM, BARD1, BRCA1, BRCA2, BRIP1, CDH1, CHEK2, EPCAM, FANCC, MLH1, MSH2, MSH6, NBN, PALB2, PMS2, PTEN, RAD51C, RAD51D, TP53, and XRCC2  (3) right lumpectomy and sentinel  lymph node sampling 06/15/2015 showed a residual pT1b pN0 invasive ductal carcinoma, grade 1, with negative margins. (There were actually a few microscopic nests of residual tumor and 0.9 is the longest distance between 2 of the microscopic nests--there was not enough tissue for repeat prognostic panel.)  (a) bilateral reduction mammoplasty 06/23/2015 showed no malignancy in either breast  (4) adjuvant radiation 09/01/2015-10/19/2015:     Right breast / 45 Gray @ 1.8 Pearline Cables per fraction x 25 fractions Right breast boost / 16 Gray at [employer] per fraction x 8 fractions    PLAN: Steffani did not show for her visit 03/01/2021.  A follow-up letter has been sent.   Shaman Muscarella, Virgie Dad, MD  02/27/21 9:50 PM Medical Oncology and Hematology [facility] [place], [place] [address] Tel. [phone]    Fax. [phone]   I, Wilburn Mylar, am acting as scribe for Dr. Virgie Dad. Rozalynn Buege.  I, Lurline Del MD, have reviewed the above documentation for accuracy and completeness, and I agree with the above.   *Total Encounter Time as defined by the Centers for Medicare and Medicaid Services includes, in addition to the face-to-face time of a patient visit (documented in the note above) non-face-to-face time: obtaining and reviewing outside history, ordering and reviewing medications, tests or procedures, care coordination (communications with other health care professionals or caregivers) and documentation in the medical record.

## 2021-03-01 ENCOUNTER — Inpatient Hospital Stay: Payer: Self-pay | Attending: Oncology | Admitting: Oncology

## 2021-03-01 ENCOUNTER — Encounter: Payer: Self-pay | Admitting: Oncology

## 2021-03-01 ENCOUNTER — Inpatient Hospital Stay: Payer: Self-pay

## 2021-03-01 DIAGNOSIS — C50311 Malignant neoplasm of lower-inner quadrant of right female breast: Secondary | ICD-10-CM

## 2021-03-01 DIAGNOSIS — Z171 Estrogen receptor negative status [ER-]: Secondary | ICD-10-CM

## 2021-05-13 ENCOUNTER — Other Ambulatory Visit: Payer: Self-pay | Admitting: Oncology

## 2021-05-13 DIAGNOSIS — Z9889 Other specified postprocedural states: Secondary | ICD-10-CM

## 2021-05-13 DIAGNOSIS — C50311 Malignant neoplasm of lower-inner quadrant of right female breast: Secondary | ICD-10-CM

## 2021-05-18 ENCOUNTER — Telehealth: Payer: Self-pay | Admitting: Adult Health

## 2021-05-18 NOTE — Telephone Encounter (Signed)
R/s per 12/29 sch msg, pt has been called and confirmed appt

## 2021-05-20 ENCOUNTER — Emergency Department (HOSPITAL_COMMUNITY)
Admission: EM | Admit: 2021-05-20 | Discharge: 2021-05-20 | Disposition: A | Discharge status: Home / Self Care | Payer: No Typology Code available for payment source | Attending: Emergency Medicine | Admitting: Emergency Medicine

## 2021-05-20 ENCOUNTER — Encounter (HOSPITAL_COMMUNITY): Payer: Self-pay

## 2021-05-20 ENCOUNTER — Emergency Department (HOSPITAL_COMMUNITY): Payer: No Typology Code available for payment source

## 2021-05-20 DIAGNOSIS — Z853 Personal history of malignant neoplasm of breast: Secondary | ICD-10-CM | POA: Diagnosis not present

## 2021-05-20 DIAGNOSIS — M549 Dorsalgia, unspecified: Secondary | ICD-10-CM | POA: Insufficient documentation

## 2021-05-20 DIAGNOSIS — R079 Chest pain, unspecified: Secondary | ICD-10-CM | POA: Diagnosis present

## 2021-05-20 DIAGNOSIS — R0789 Other chest pain: Secondary | ICD-10-CM | POA: Insufficient documentation

## 2021-05-20 DIAGNOSIS — Y92410 Unspecified street and highway as the place of occurrence of the external cause: Secondary | ICD-10-CM | POA: Insufficient documentation

## 2021-05-20 MED ORDER — METHOCARBAMOL 500 MG PO TABS: 500.0000 mg | ORAL_TABLET | Freq: Two times a day (BID) | ORAL | 0 refills | Status: DC
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
## 2021-05-27 ENCOUNTER — Other Ambulatory Visit: Payer: Self-pay

## 2021-05-27 ENCOUNTER — Ambulatory Visit: Payer: Self-pay | Admitting: Adult Health

## 2021-06-02 ENCOUNTER — Telehealth: Payer: Self-pay | Admitting: *Deleted

## 2021-06-02 NOTE — Telephone Encounter (Signed)
"  [name] [phone] (home) calling to get a new ADA form filled out for a new employer.  At least once a week needed to restart therapy.  How can I get form to you?"  Advised to deliver, e-mail or Fax.  Confirmed her e-mail address.    18/Jan/2023 at 1027 am [facility] Dis[facility] and [place] with request & Authorization for Use/Disclosure of Protected  Health Information forms e-mailed to [email or web address] from [email or web address].   Awaiting e-mail reply from patient.

## 2021-06-16 ENCOUNTER — Other Ambulatory Visit: Payer: Self-pay | Admitting: Adult Health

## 2021-06-16 ENCOUNTER — Ambulatory Visit
Admission: RE | Admit: 2021-06-16 | Discharge: 2021-06-16 | Disposition: A | Discharge status: Home / Self Care | Payer: No Typology Code available for payment source | Source: Ambulatory Visit | Attending: Oncology | Admitting: Oncology

## 2021-06-16 DIAGNOSIS — Z9889 Other specified postprocedural states: Secondary | ICD-10-CM

## 2021-06-18 NOTE — Telephone Encounter (Signed)
Work accommodation returned to [employer] no.# [id number].  E-mailed to patient [email or web address] and originals to alphabetical file behind registration area one for patient pickup.

## 2021-06-22 ENCOUNTER — Other Ambulatory Visit: Payer: Self-pay

## 2021-06-22 ENCOUNTER — Inpatient Hospital Stay (HOSPITAL_BASED_OUTPATIENT_CLINIC_OR_DEPARTMENT_OTHER): Payer: No Typology Code available for payment source | Admitting: Adult Health

## 2021-06-22 ENCOUNTER — Encounter: Payer: Self-pay | Admitting: Adult Health

## 2021-06-22 ENCOUNTER — Inpatient Hospital Stay: Payer: No Typology Code available for payment source | Attending: Adult Health

## 2021-06-22 VITALS — BP 155/95 | HR 87 | Temp 97.9°F | Wt 213.1 lb

## 2021-06-22 DIAGNOSIS — Z803 Family history of malignant neoplasm of breast: Secondary | ICD-10-CM | POA: Diagnosis not present

## 2021-06-22 DIAGNOSIS — C50311 Malignant neoplasm of lower-inner quadrant of right female breast: Secondary | ICD-10-CM

## 2021-06-22 DIAGNOSIS — Z923 Personal history of irradiation: Secondary | ICD-10-CM | POA: Insufficient documentation

## 2021-06-22 DIAGNOSIS — Z9221 Personal history of antineoplastic chemotherapy: Secondary | ICD-10-CM | POA: Diagnosis not present

## 2021-06-22 DIAGNOSIS — Z800 Family history of malignant neoplasm of digestive organs: Secondary | ICD-10-CM | POA: Insufficient documentation

## 2021-06-22 DIAGNOSIS — Z853 Personal history of malignant neoplasm of breast: Secondary | ICD-10-CM | POA: Diagnosis present

## 2021-06-22 DIAGNOSIS — Z171 Estrogen receptor negative status [ER-]: Secondary | ICD-10-CM

## 2021-06-22 MED ORDER — SUMATRIPTAN SUCCINATE 50 MG PO TABS: 50.0000 mg | ORAL_TABLET | ORAL | 1 refills | Status: DC | PRN

## 2021-06-22 MED ORDER — MELOXICAM 15 MG PO TABS: ORAL_TABLET | ORAL | 0 refills | Status: DC

## 2021-06-22 MED ORDER — NORTRIPTYLINE HCL 10 MG PO CAPS: 10.0000 mg | ORAL_CAPSULE | Freq: Every day | ORAL | 1 refills | Status: DC

## 2021-06-22 MED ORDER — ONDANSETRON HCL 4 MG PO TABS: ORAL_TABLET | ORAL | 0 refills | Status: DC

## 2021-06-22 NOTE — Progress Notes (Signed)
[place]  [phone] [phone]     ID: Susan Wilson DOB: May 29, 1975  MR#: [id number]  YBO#:[id number]  Patient Care Team: Pcp, No as PCP - General Rolm Bookbinder, MD as Consulting Physician (General Surgery) Alda Berthold, DO as Consulting Physician (Neurology) Irene Limbo, MD as Consulting Physician (Plastic Surgery) Benay Pike, MD as Consulting Physician (Hematology and Oncology) OTHER MD:   CHIEF COMPLAINT: Triple negative breast cancer  CURRENT TREATMENT: Observation   INTERVAL HISTORY:  [name] is here today for follow-up of her history of triple negative breast cancer.  Her most recent mammogram was completed on June 16, 2021.  Her mammogram showed no suspicious masses calcification or distortion in either breast and the right lumpectomy site was stable with fat necrosis and oil cysts.  Her breast density was category B.     REVIEW OF SYSTEMS: Review of Systems  Constitutional:  Negative for appetite change, chills, fatigue, fever and unexpected weight change.  HENT:   Negative for hearing loss, lump/mass and trouble swallowing.   Eyes:  Negative for eye problems and icterus.  Respiratory:  Negative for chest tightness, cough and shortness of breath.   Cardiovascular:  Negative for chest pain, leg swelling and palpitations.  Gastrointestinal:  Negative for abdominal distention, abdominal pain, constipation, diarrhea, nausea and vomiting.  Endocrine: Negative for hot flashes.  Genitourinary:  Negative for difficulty urinating.   Musculoskeletal:  Negative for arthralgias.  Skin:  Negative for itching and rash.  Neurological:  Negative for dizziness, extremity weakness, headaches and numbness.  Hematological:  Negative for adenopathy. Does not bruise/bleed easily.  Psychiatric/Behavioral:  Negative for depression. The patient is not nervous/anxious.      COVID 19 VACCINATION STATUS: refuses  vaccination   BREAST CANCER HISTORY: From the original intake note:  Susan Wilson herself noted a mass in her right breast sometime around April. Initially she thought it might be related to menstruation, but as it did not change and eventually became tender, she brought it to her physician's attention. On 11/20/2014 patient underwent bilateral diagnostic mammography with tomosynthesis and right breast ultrasonography at the [facility] Center. The breast density was category B. There was a hyperdense mass in the right lower inner quadrant associated with skin thickening. There was also a 5 mm nodule posteriorly at the 8:30 o'clock position in the right breast. There were several hyperdense nodules in the right axilla. On physical exam there was a firm fixed mass in the right breast at the 5:00 position. By ultrasound this was lobulated and appear to involve the skin. It measures up to 4.1 cm. There was no sonographic correlation to the 5 mm nodule seen in a different area of the right breast. The right axilla showed 3 hypervascular lymph nodes with prominent cortical thickening, measuring less than 1.5 cm.  On 11/21/2014 the patient underwent right breast biopsy (5:00 mass) and biopsy of one of the suspicious right axillary lymph nodes. The pathology (SAA [id number]) showed the breast biopsy to consist of invasive ductal carcinoma, grade 2, estrogen receptor and progesterone receptor negative, with an MIB-1 of 20%, and HER-2 equivocal, with the signals ratio of 1.41, but the average copy number per cell 4.35.  The patient's subsequent history is as detailed below   PAST MEDICAL HISTORY: Past Medical History:  Diagnosis Date   Anemia    Breast asymmetry 12/2015   Breast cancer ([place]) 2017   right breast   History of breast cancer 11/2014   right  History of chemotherapy    Migraines    Neuropathy due to chemotherapeutic drug ([place])    fingers and toes   Personal history of chemotherapy 2017   Personal  history of radiation therapy 2017    PAST SURGICAL HISTORY: Past Surgical History:  Procedure Laterality Date   BREAST LUMPECTOMY Right 2017   BREAST RECONSTRUCTION Right 06/23/2015   Procedure: ONCOPLASTIC RIGHT BREAST REDUCTION;  Surgeon: Irene Limbo, MD;  Location: [facility];  Service: Plastics;  Laterality: Right;   BREAST REDUCTION SURGERY Left 06/23/2015   Procedure: MAMMARY REDUCTION  (BREAST) LEFT BREAST FOR SYMETRY (BILATERAL BREAST REDUCTION);  Surgeon: Irene Limbo, MD;  Location: [facility];  Service: Plastics;  Laterality: Left;   BREAST REDUCTION SURGERY Left 01/15/2016   Procedure: MAMMARY REDUCTION  (BREAST) LEFT;  Surgeon: Irene Limbo, MD;  Location: [facility];  Service: Plastics;  Laterality: Left;   MASTOPEXY Left 01/15/2016   Procedure: POSSIBLE LEFT MASTOPEXY;  Surgeon: Irene Limbo, MD;  Location: [facility];  Service: Plastics;  Laterality: Left;   PORT-A-CATH REMOVAL Right 01/15/2016   Procedure: REMOVAL OF RIGHT PORT-A-CATH;  Surgeon: Irene Limbo, MD;  Location: [facility];  Service: Plastics;  Laterality: Right;   PORTACATH PLACEMENT Right 12/16/2014   Procedure: INSERTION PORT-A-CATH WITH ULTRASOUND;  Surgeon: Rolm Bookbinder, MD;  Location: [facility];  Service: General;  Laterality: Right;   RADIOACTIVE SEED GUIDED PARTIAL MASTECTOMY WITH AXILLARY SENTINEL LYMPH NODE BIOPSY Right 06/15/2015   Procedure: RADIOACTIVE SEED GUIDED PARTIAL MASTECTOMY WITH AXILLARY SENTINEL LYMPH NODE BIOPSY;  Surgeon: Rolm Bookbinder, MD;  Location: [facility];  Service: General;  Laterality: Right;   REDUCTION MAMMAPLASTY Bilateral 2017    FAMILY HISTORY Family History  Problem Relation Age of Onset   Mesothelioma Maternal Grandfather        asbestos exposure, died in his 78s   Heart defect Sister 0   Breast cancer Paternal Grandmother    Diabetes Paternal Grandmother    Cancer  Paternal Grandfather        NOS   Colon cancer Other 51       MGMs brother with colon cancer   Cancer Other        several of MGF's sisters with cancer NOS   Liver cancer Other        MGF's brother   Heart disease Paternal Aunt    Heart disease Paternal Uncle   The patient's parents are living, her father being 47 and her mother 47 as of July 2016. The patient had no brothers. One sister died at age 14 from cardiac problems. The other sister is in good health. On the maternal side there is a history of colon cancer and an uncle age 43, liver cancer in a great uncle and mesothelioma in the maternal grandfather.   GYNECOLOGIC HISTORY:  No LMP recorded. Menarche age 43. The patient is GX P2. Her periods were interrupted with chemotherapy, but have resumed (August 2018), although now very scant and brief   SOCIAL HISTORY: (Updated 07/04/2018) Dellroy works at [employer]. She formerly worked in Therapist, art for [employer]. Her husband Antrell works for [employer]. The daughters are [place] and Lovie Macadamia, age 50 and 27 as of 06/2018. Arville Lime will be going to college for photography and videography. The patient attends a local [employer]: Not in place   HEALTH MAINTENANCE: Social History   Tobacco Use   Smoking status:  Never   Smokeless tobacco: Never  Substance Use Topics   Alcohol use: Yes    Comment: rarely   Drug use: No     Colonoscopy: n/a (age)  PAP: 12/2018, HPV+, atypical cells / biopsy negative  Bone density: n/a (age)  Lipid panel:  No Known Allergies  Current Outpatient Medications  Medication Sig Dispense Refill   nortriptyline (PAMELOR) 10 MG capsule Take 1 capsule (10 mg total) by mouth at bedtime. 30 capsule 1   b complex vitamins tablet Take 1 tablet by mouth daily.     ferrous sulfate 325 (65 FE) MG tablet Take 325 mg by mouth daily with breakfast.     meloxicam (MOBIC) 15 MG tablet TAKE 1 TABLET(15 MG)  BY MOUTH DAILY 60 tablet 0   methocarbamol (ROBAXIN) 500 MG tablet Take 1 tablet (500 mg total) by mouth 2 (two) times daily. 20 tablet 0   ondansetron (ZOFRAN) 4 MG tablet TAKE 1 TABLET BY MOUTH EVERY 8 HOURS AS NEEDED FOR NAUSEA OR VOMITING 30 tablet 0   SUMAtriptan (IMITREX) 50 MG tablet Take 1 tablet (50 mg total) by mouth every 2 (two) hours as needed for migraine. Mayrepeat in 2 hrs if needed. 10 tablet 1   vitamin E (VITAMIN E) 400 UNIT capsule Take 1 capsule (400 Units total) by mouth daily. 30 capsule 0   No current facility-administered medications for this visit.    OBJECTIVE: African-American woman who appears stated age 47:   06/22/21 1348  BP: (!) 155/95  Pulse: 87  Temp: 97.9 F (36.6 C)  SpO2: 100%      Body mass index is 35.46 kg/m.    GENERAL: Patient is a well appearing female in no acute distress HEENT:  Sclerae anicteric.  Oropharynx clear and moist. No ulcerations or evidence of oropharyngeal candidiasis. Neck is supple.  NODES:  No cervical, supraclavicular, or axillary lymphadenopathy palpated.  BREAST EXAM: Status post right breast lumpectomy, mammoplasty, and radiation, stable area of radiology noted unchanged fat necrosis in central breast just underneath areola, no sign of breast cancer recurrence, left breast status post mammoplasty and benign breast exam LUNGS:  Clear to auscultation bilaterally.  No wheezes or rhonchi. HEART:  Regular rate and rhythm. No murmur appreciated. ABDOMEN:  Soft, nontender.  Positive, normoactive bowel sounds. No organomegaly palpated. MSK:  No focal spinal tenderness to palpation. Full range of motion bilaterally in the upper extremities. EXTREMITIES:  No peripheral edema.   SKIN:  Clear with no obvious rashes or skin changes. No nail dyscrasia. NEURO:  Nonfocal. Well oriented.  Appropriate affect.   LAB RESULTS:  CMP  CBC Latest Ref Rng & Units 07/16/2019 07/04/2018 02/01/2018  WBC 4.0 - 10.5 K/uL 5.6 6.3 5.9   Hemoglobin 12.0 - 15.0 g/dL 12.4 11.5(L) 11.8  Hematocrit 36.0 - 46.0 % 40.1 37.5 37.6  Platelets 150 - 400 K/uL 254 269 263   CMP Latest Ref Rng & Units 07/16/2019 07/04/2018 02/01/2018  Glucose 70 - 99 mg/dL 103(H) 95 87  BUN 6 - 20 mg/dL _0 Creatinine 0.44 - 1.00 mg/dL 0.74 0.79 0.77  Sodium 135 - 145 mmol/L 137 139 136  Potassium 3.5 - 5.1 mmol/L 4.1 3.7 4.2  Chloride 98 - 111 mmol/L 105 105 103  CO2 22 - 32 mmol/L _1 Calcium 8.9 - 10.3 mg/dL 8.7(L) 9.0 9.3  Total Protein 6.5 - 8.1 g/dL 7.9 7.8 7.8  Total Bilirubin 0.3 - 1.2 mg/dL 0.7 0.5 0.8  Alkaline Phos 38 - 126 U/L 64 74 74  AST 15 - 41 U/L 17 11(L) 11(L)  ALT 0 - 44 U/L _0 Urinalysis  STUDIES: MM DIAG BREAST TOMO BILATERAL  Result Date: 06/16/2021 CLINICAL DATA:  Right lumpectomy.  Annual mammography. EXAM: DIGITAL DIAGNOSTIC BILATERAL MAMMOGRAM WITH TOMOSYNTHESIS AND CAD TECHNIQUE: Bilateral digital diagnostic mammography and breast tomosynthesis was performed. The images were evaluated with computer-aided detection. COMPARISON:  Previous exam(s). ACR Breast Density Category b: There are scattered areas of fibroglandular density. FINDINGS: No suspicious masses, calcifications, or distortion are identified in either breast. The right lumpectomy site is stable with fat necrosis and oil cysts. IMPRESSION: Mammographic evidence of malignancy. RECOMMENDATION: Per protocol, as the patient is now 2 or more years status post lumpectomy, she may return to annual screening mammography in 1 year. However, given the history of breast cancer, the patient remains eligible for annual diagnostic mammography if preferred. I have discussed the findings and recommendations with the patient. If applicable, a reminder letter will be sent to the patient regarding the next appointment. BI-RADS CATEGORY  2: Benign. Electronically Signed   By: Dorise Bullion III M.D.   On: 06/16/2021 16:23    ASSESSMENT: 46 y.o. BRCA negative  [place] woman s/p Right breast biopsy lower inner quadrant 11/21/2014 for a clinical T2 NX, stage 2 invasive ductal carcinoma, grade 2 or 3, estrogen and progesterone receptor negative, HER-2 equivocal, with an Mib-1 of 20%  (a) biopsy of a suspicious axillary lymph node same day was negative, but discordant  (b) review of 80 additional tumor cells by FISH still showed HER-2 not amplified; tumor should be treated as a triple negative  (1) neoadjuvant chemotherapy started 01/01/2015 consisting of cyclophosphamide and doxorubicin in dose dense fashion 4, completed 02/13/2015, followed by paclitaxel/ carboplatin weekly 12 started 02/26/2015, completed 05/14/2015.   (2) genetics testing 12/15/2014 through the Breast/Ovarian gene panel offered by [facility] found no deleterious mutations in ATM, BARD1, BRCA1, BRCA2, BRIP1, CDH1, CHEK2, EPCAM, FANCC, MLH1, MSH2, MSH6, NBN, PALB2, PMS2, PTEN, RAD51C, RAD51D, TP53, and XRCC2  (3) right lumpectomy and sentinel lymph node sampling 06/15/2015 showed a residual pT1b pN0 invasive ductal carcinoma, grade 1, with negative margins. (There were actually a few microscopic nests of residual tumor and 0.9 is the longest distance between 2 of the microscopic nests--there was not enough tissue for repeat prognostic panel.)  (a) bilateral reduction mammoplasty 06/23/2015 showed no malignancy in either breast  (4) adjuvant radiation 09/01/2015-10/19/2015:     Right breast / 45 Gray @ 1.8 [place] Cables per fraction x 25 fractions Right breast boost / 16 Gray at [employer] per fraction x 8 fractions    PLAN: Ardell is here today for follow-up of her history of stage IIa estrogen negative breast cancer.  She is status post lumpectomy, reconstruction, chemotherapy, and radiation.  She has no clinical or radiographic sign of breast cancer recurrence.  She is due for mammogram in 1 year.    [name] and I reviewed her health maintenance and getting her in with primary care.  She continues  on her current medications and needs refills.  I have given her 1 refill so that she can make it to primary care.  I did place a referral for her to see about her primary care on [place].  I recommended healthy diet and exercise which she will continue.  We will see her back in 6 months.  She will establish with  Dr. Chryl Heck at that time.  She knows to call for any questions or concerns that may arise between now and then and we are happy to see her sooner if needed.  Total encounter time: 30 minutes in face-to-face visit time, chart review, lab review, care coordination, and documentation of the encounter.   Wilber Bihari, NP 06/22/21 3:50 PM Medical Oncology and Hematology [facility] [place], [place] [address] Tel. [phone]    Fax. [phone]   *Total Encounter Time as defined by the Centers for Medicare and Medicaid Services includes, in addition to the face-to-face time of a patient visit (documented in the note above) non-face-to-face time: obtaining and reviewing outside history, ordering and reviewing medications, tests or procedures, care coordination (communications with other health care professionals or caregivers) and documentation in the medical record.

## 2021-06-23 LAB — CANCER ANTIGEN 27.29: CA 27.29: 13.4 U/mL (ref 0.0–38.6)

## 2021-07-20 ENCOUNTER — Telehealth: Payer: Self-pay

## 2021-07-20 MED ORDER — GABAPENTIN 300 MG PO CAPS: 300.0000 mg | ORAL_CAPSULE | Freq: Every day | ORAL | 1 refills | Status: DC

## 2021-07-20 NOTE — Telephone Encounter (Signed)
Pt called and requests refill on gabapentin for PN. Spoke with Mendel Ryder, NP and discussed the d/c of this medication was due to number of refills expiring. Per Mendel Ryder, gabapentin reordered to [facility]. Pt is aware and verbalized thanks.  ?

## 2021-07-21 ENCOUNTER — Telehealth: Payer: Self-pay | Admitting: *Deleted

## 2021-07-21 NOTE — Telephone Encounter (Signed)
Good morning, ?My name is Susan Wilson you completed fmla accommodation paperwork for me last month. I emailed you after that advising my employer said they needed a a more specific estimate of my flare ups. My previous documentation included estimates. My flare ups can range 1-2 days a week approximately lasting anywhere from 8 hours onward with nerve pain and spasms and an additional break is needed for shifts over 6 hours. Can you assist with the update? Please advise. ? ?Connected with [name] [phone] (home) .  Apologized for delayed response for work accommodation form initially returned 06/18/2021.  No E-mails receive by this forms nurse or noted as archived, deleted or through patient portal.     ? ?This nurse updated form providing description of daily peripheral neuropathy with a higher-level degree of flare ups affecting work ability.  Requesting [employer] work accommodation for 4 to 8 hours or up one to two days per episode per week and extra breaks particularly when needed to take medications for nausea.  ?Patient notified and copy e-mailed to patient.   ?

## 2021-07-27 ENCOUNTER — Encounter: Payer: Self-pay | Admitting: *Deleted

## 2021-07-27 ENCOUNTER — Telehealth: Payer: Self-pay | Admitting: *Deleted

## 2021-07-27 NOTE — Telephone Encounter (Signed)
Aliza Style <[email or web address]> ?[place] ?*Caution - External email - see footer for warnings* ? ?Good morning, ?This is Baljit Osgood. The document updates are being reviewed and look good. I was advised to have you send on letter head or something similar advising due to neuropathy or diagnosis I need ergonomic keyboard, mouse, etc so these things cam be issued to me. You don't have to redo any of forms or update them. The others are sufficient.  ? ?Thanks in advance, ?Vivien Lad  ?[phone]  ? ?This nurse e-mailed response to Viola her the "Form Staff " completes forms only.  This message will be copied to your EMR and routed to provider and collaborative nurse.  ?

## 2021-07-27 NOTE — Telephone Encounter (Signed)
[email or web address] ?[place] ?*Caution - External email - see footer for warnings* ? ?  ?Thank you so much I got it. Have a great day ? ?--- Originally sent by [email or web address] on Jul 27, 2021 9:47 AM --- ?

## 2021-08-11 ENCOUNTER — Other Ambulatory Visit: Payer: Self-pay | Admitting: Adult Health

## 2021-08-15 ENCOUNTER — Other Ambulatory Visit: Payer: Self-pay | Admitting: Adult Health

## 2021-08-24 ENCOUNTER — Other Ambulatory Visit: Payer: Self-pay | Admitting: Adult Health

## 2021-08-24 DIAGNOSIS — C50311 Malignant neoplasm of lower-inner quadrant of right female breast: Secondary | ICD-10-CM

## 2021-09-09 ENCOUNTER — Other Ambulatory Visit: Payer: Self-pay | Admitting: Adult Health

## 2021-09-09 DIAGNOSIS — C50311 Malignant neoplasm of lower-inner quadrant of right female breast: Secondary | ICD-10-CM

## 2021-09-15 ENCOUNTER — Telehealth: Payer: Self-pay

## 2021-09-15 ENCOUNTER — Other Ambulatory Visit: Payer: Self-pay | Admitting: Adult Health

## 2021-09-15 NOTE — Telephone Encounter (Signed)
Spoke with Patient regarding request for Provider to complete FMLA paperwork for upcoming reconstructive surgery. Advised Patient per Provider's recommendation to have Surgeon complete the FMLA form due to time needed for healing and restrictions imposed per surgical guidelines. Patient verbalized understanding. Patient stated that her Employer was requesting additional information from Provider regarding recent letter request for ergonomic keyboard, mouse and any other additional accessories to facilitate a more comfortable work environment due to ongoing neuropathy relative to previous chemotherapy treatments. Advised Patient to message Provider and provide additional information that is being requested. Patient verbalized understanding and stated that she had already sent the information to the Provider. No other needs or concerns voiced at this time. ?

## 2021-10-07 ENCOUNTER — Encounter: Payer: Self-pay | Admitting: Nurse Practitioner

## 2021-10-07 ENCOUNTER — Ambulatory Visit: Payer: No Typology Code available for payment source | Admitting: Nurse Practitioner

## 2021-10-07 VITALS — BP 125/76 | HR 91 | Temp 98.4°F | Ht 65.0 in | Wt 216.0 lb

## 2021-10-07 DIAGNOSIS — Z862 Personal history of diseases of the blood and blood-forming organs and certain disorders involving the immune mechanism: Secondary | ICD-10-CM

## 2021-10-07 DIAGNOSIS — Z1329 Encounter for screening for other suspected endocrine disorder: Secondary | ICD-10-CM

## 2021-10-07 DIAGNOSIS — G43009 Migraine without aura, not intractable, without status migrainosus: Secondary | ICD-10-CM

## 2021-10-07 DIAGNOSIS — Z6835 Body mass index (BMI) 35.0-35.9, adult: Secondary | ICD-10-CM

## 2021-10-07 DIAGNOSIS — E669 Obesity, unspecified: Secondary | ICD-10-CM | POA: Diagnosis not present

## 2021-10-07 DIAGNOSIS — E66811 Obesity, class 1: Secondary | ICD-10-CM | POA: Insufficient documentation

## 2021-10-07 DIAGNOSIS — Z136 Encounter for screening for cardiovascular disorders: Secondary | ICD-10-CM

## 2021-10-07 DIAGNOSIS — Z131 Encounter for screening for diabetes mellitus: Secondary | ICD-10-CM

## 2021-10-07 LAB — CBC
HCT: 37.7 % (ref 36.0–46.0)
Hemoglobin: 12 g/dL (ref 12.0–15.0)
MCHC: 31.8 g/dL (ref 30.0–36.0)
MCV: 79.8 fl (ref 78.0–100.0)
Platelets: 292 10*3/uL (ref 150.0–400.0)
RBC: 4.72 Mil/uL (ref 3.87–5.11)
RDW: 14.6 % (ref 11.5–15.5)
WBC: 6.1 10*3/uL (ref 4.0–10.5)

## 2021-10-07 LAB — COMPREHENSIVE METABOLIC PANEL
ALT: 12 U/L (ref 0–35)
AST: 14 U/L (ref 0–37)
Albumin: 4.4 g/dL (ref 3.5–5.2)
Alkaline Phosphatase: 56 U/L (ref 39–117)
BUN: 15 mg/dL (ref 6–23)
CO2: 30 mEq/L (ref 19–32)
Calcium: 9.5 mg/dL (ref 8.4–10.5)
Chloride: 101 mEq/L (ref 96–112)
Creatinine, Ser: 0.91 mg/dL (ref 0.40–1.20)
GFR: 75.68 mL/min (ref >60.00)
Glucose, Bld: 97 mg/dL (ref 70–99)
Potassium: 4 mEq/L (ref 3.5–5.1)
Sodium: 136 mEq/L (ref 135–145)
Total Bilirubin: 0.7 mg/dL (ref 0.2–1.2)
Total Protein: 7.9 g/dL (ref 6.0–8.3)

## 2021-10-07 LAB — LIPID PANEL
Cholesterol: 172 mg/dL (ref 0–200)
HDL: 47.9 mg/dL (ref >39.00)
LDL Cholesterol: 113 mg/dL — ABNORMAL HIGH (ref 0–99)
NonHDL: 123.9
Total CHOL/HDL Ratio: 4
Triglycerides: 53 mg/dL (ref 0.0–149.0)
VLDL: 10.6 mg/dL (ref 0.0–40.0)

## 2021-10-07 LAB — HEMOGLOBIN A1C: Hgb A1c MFr Bld: 5.5 % (ref 4.6–6.5)

## 2021-10-07 LAB — FERRITIN: Ferritin: 30 ng/mL (ref 10.0–291.0)

## 2021-10-07 LAB — IRON: Iron: 57 ug/dL (ref 42–145)

## 2021-10-07 LAB — TSH: TSH: 1.83 u[IU]/mL (ref 0.35–5.50)

## 2021-10-07 MED ORDER — NURTEC 75 MG PO TBDP: 1.0000 | ORAL_TABLET | Freq: Every day | ORAL | 1 refills | Status: DC | PRN

## 2021-10-07 NOTE — Assessment & Plan Note (Signed)
Blood work ordered today for further evaluation, further recommendations may be made based upon these results.

## 2021-10-07 NOTE — Assessment & Plan Note (Signed)
Ordered screening A1c today for further evaluation.  Further recommendations may be made based upon these results.

## 2021-10-07 NOTE — Patient Instructions (Signed)
Rimegepant Disintegrating Tablets What is this medication? RIMEGEPANT (ri ME je pant) prevents and treats migraines. It works by blocking a substance in the body that causes migraines. This medicine may be used for other purposes; ask your health care provider or pharmacist if you have questions. COMMON BRAND NAME(S): NURTEC ODT What should I tell my care team before I take this medication? They need to know if you have any of these conditions: Kidney disease Liver disease An unusual or allergic reaction to rimegepant, other medications, foods, dyes, or preservatives Pregnant or trying to get pregnant Breast-feeding How should I use this medication? Take this medication by mouth. Take it as directed on the prescription label. Leave the tablet in the sealed pack until you are ready to take it. With dry hands, open the pack and gently remove the tablet. If the tablet breaks or crumbles, throw it away. Use a new tablet. Place the tablet in the mouth and allow it to dissolve. Then, swallow it. Do not cut, crush, or chew this medication. You do not need water to take this medication. Talk to your care team about the use of this medication in children. Special care may be needed. Overdosage: If you think you have taken too much of this medicine contact a poison control center or emergency room at once. NOTE: This medicine is only for you. Do not share this medicine with others. What if I miss a dose? This does not apply. This medication is not for regular use. What may interact with this medication? Certain medications for fungal infections, such as fluconazole, itraconazole Rifampin This list may not describe all possible interactions. Give your health care provider a list of all the medicines, herbs, non-prescription drugs, or dietary supplements you use. Also tell them if you smoke, drink alcohol, or use illegal drugs. Some items may interact with your medicine. What should I watch for while  using this medication? Visit your care team for regular checks on your progress. Tell your care team if your symptoms do not start to get better or if they get worse. What side effects may I notice from receiving this medication? Side effects that you should report to your care team as soon as possible: Allergic reactions--skin rash, itching, hives, swelling of the face, lips, tongue, or throat Side effects that usually do not require medical attention (report to your care team if they continue or are bothersome): Nausea Stomach pain This list may not describe all possible side effects. Call your doctor for medical advice about side effects. You may report side effects to FDA at 1-800-FDA-1088. Where should I keep my medication? Keep out of the reach of children and pets. Store at room temperature between 20 and 25 degrees C (68 and 77 degrees F). Get rid of any unused medication after the expiration date. To get rid of medications that are no longer needed or have expired: Take the medication to a medication take-back program. Check with your pharmacy or law enforcement to find a location. If you cannot return the medication, check the label or package insert to see if the medication should be thrown out in the garbage or flushed down the toilet. If you are not sure, ask your care team. If it is safe to put it in the trash, take the medication out of the container. Mix the medication with cat litter, dirt, coffee grounds, or other unwanted substance. Seal the mixture in a bag or container. Put it in the trash. NOTE: This  sheet is a summary. It may not cover all possible information. If you have questions about this medicine, talk to your doctor, pharmacist, or health care provider.  2023 Elsevier/Gold Standard (2021-06-23 00:00:00)

## 2021-10-07 NOTE — Assessment & Plan Note (Signed)
Chronic, suboptimally controlled.  We will provide Nurtec samples today.  May consider prescription if she finds benefit using Nurtec for abortive therapy.  No recent metabolic panel available per my chart, will order CMP to check for kidney function.  Patient was told not to take Nurtec until I discussed her results with her via her MyChart.  She reports understanding.

## 2021-10-07 NOTE — Assessment & Plan Note (Signed)
Patient continues on iron supplementation.  We will check CBC, ferritin, and iron level for further evaluation.  Further recommendations to be made based upon results.

## 2021-10-07 NOTE — Progress Notes (Signed)
Subjective:  Patient ID: Susan Wilson, female    DOB: [birth year]  Age: 46 y.o. MRN: [id number]  CC:  Chief Complaint  Patient presents with   New Patient (Initial Visit)      HPI  This patient arrives today for the above.  She has a history of breast cancer and follows up with oncology on a regular basis.  She was referred here by her oncologist's that she could establish with primary care provider.  Her main concern today is that she has been having more frequent migraines.  She takes nortriptyline as preventative medication and Imitrex as needed for abortive therapy.  She reports that when she takes her Imitrex her symptoms improved but she continues to have headache with photophobia and blurry vision.  Her migraines usually result in blurring of vision, photophobia, sound sensitivity, and nausea.  She reports having 1-2 migraines a week, with 1 migraine being severe enough that she has to stop work.  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 toes   Personal history of chemotherapy 2017   Personal history of radiation therapy 2017      Family History  Problem Relation Age of Onset   Mesothelioma Maternal Grandfather        asbestos exposure, died in his 26s   Heart defect Sister 0   Breast cancer Paternal Grandmother    Diabetes Paternal Grandmother    Cancer Paternal Grandfather        NOS   Colon cancer Other 33       MGMs brother with colon cancer   Cancer Other        several of MGF's sisters with cancer NOS   Liver cancer Other        MGF's brother   Heart disease Paternal Aunt    Heart disease Paternal Uncle     Social History   Social History Narrative   Lives with husband.  They have two children.   She works at [employer] and retirement.   Highest level of education:  Associates degree   Social History   Tobacco Use   Smoking status: Never   Smokeless tobacco: Never  Substance Use Topics   Alcohol use: Yes    Comment: rarely     Current Meds  Medication Sig   b complex vitamins tablet Take 1 tablet by mouth daily.   ferrous sulfate 325 (65 FE) MG tablet Take 325 mg by mouth daily with breakfast.   gabapentin (NEURONTIN) 300 MG capsule Take 1 capsule (300 mg total) by mouth at bedtime.   meloxicam (MOBIC) 15 MG tablet TAKE 1 TABLET (15 MG) BY MOUTH DAILY   methocarbamol (ROBAXIN) 500 MG tablet Take 1 tablet (500 mg total) by mouth 2 (two) times daily.   nortriptyline (PAMELOR) 10 MG capsule TAKE 1 CAPSULE BY MOUTH EVERYDAY AT BEDTIME   ondansetron (ZOFRAN) 4 MG tablet TAKE 1 TABLET BY MOUTH EVERY 8 HOURS AS NEEDED FOR NAUSEA OR VOMITING   Rimegepant Sulfate (NURTEC) 75 MG TBDP Take 1 tablet by mouth daily as needed.   SUMAtriptan (IMITREX) 50 MG tablet PLEASE SEE ATTACHED FOR DETAILED DIRECTIONS   vitamin  E (VITAMIN E) 400 UNIT capsule Take 1 capsule (400 Units total) by mouth daily.    ROS:  Review of Systems  Constitutional:  Negative for fever, malaise/fatigue and weight loss.  Respiratory:  Negative for cough and shortness of breath.   Cardiovascular:  Negative for chest pain and palpitations.  Gastrointestinal:  Negative for abdominal pain and blood in stool.  Neurological:  Positive for dizziness (intermittent feeling off balanced/motion sickness improves with nausea medication) and headaches (migraine). Negative for loss of consciousness.  Psychiatric/Behavioral:  Negative for depression and suicidal ideas.     Objective:   Today's Vitals: BP 125/76 (BP Location: Left Arm, Patient Position: Sitting, Cuff Size: Normal)   Pulse 91   Temp 98.4 F (36.9 C) (Oral)   Ht 5' 5"  (1.651 m)   Wt 216 lb (98 kg)   LMP 09/20/2021   SpO2 99%   BMI 35.94 kg/m      10/07/2021    9:51 AM 06/22/2021    1:48 PM 05/20/2021    4:28 PM  Vitals with BMI  Height 5' 5"     Weight 216 lbs 213 lbs 2 oz   BMI 08.14    Systolic 481 856 314  Diastolic 76 95 82  Pulse 91 87 74     Physical Exam Vitals reviewed.  Constitutional:      General: She is not in acute distress.    Appearance: Normal appearance.  HENT:     Head: Normocephalic and atraumatic.  Neck:     Vascular: No carotid bruit.  Cardiovascular:     Rate and Rhythm: Normal rate and regular rhythm.     Pulses: Normal pulses.     Heart sounds: Normal heart sounds.  Pulmonary:     Effort: Pulmonary effort is normal.     Breath sounds: Normal breath sounds.  Skin:    General: Skin is warm and dry.  Neurological:     General: No focal deficit present.     Mental Status: She is alert and oriented to person, place, and time.  Psychiatric:        Mood and Affect: Mood normal.        Behavior: Behavior normal.        Judgment: Judgment normal.         Assessment and Plan   1. Migraine without aura and without status migrainosus, not intractable   2. Screening for cardiovascular condition   3. History of anemia   4. Thyroid disorder screening   5. Diabetes mellitus screening   6. Obesity (BMI 30.0-34.9)      Plan: See plan via problem list below.   Tests ordered Orders Placed This Encounter  Procedures   TSH   Hemoglobin A1c   Lipid panel   Comprehensive metabolic panel   CBC   Ferritin   Iron      Meds ordered this encounter  Medications   Rimegepant Sulfate (NURTEC) 75 MG TBDP    Sig: Take 1 tablet by mouth daily as needed.    Dispense:  30 tablet    Refill:  1    Order Specific Question:   Supervising Provider    Answer:   Binnie Rail [id number]    Patient to follow-up in 2 weeks or sooner as needed for comprehensive physical exam.  Ailene Ards, NP

## 2021-10-08 ENCOUNTER — Encounter: Payer: Self-pay | Admitting: Nurse Practitioner

## 2021-10-14 ENCOUNTER — Telehealth: Payer: Self-pay | Admitting: Nurse Practitioner

## 2021-10-14 NOTE — Telephone Encounter (Signed)
Patient's work accommodation forms have been completed.  They are on your desk.  Please make a copy for our records and you can call her to pick up the originals.  Thank you.

## 2021-10-15 NOTE — Telephone Encounter (Signed)
Patient notified and the a copy was faxed to [phone] and emailed to the patient at [email or web address]

## 2021-10-19 NOTE — Telephone Encounter (Signed)
Form refaxed to [phone]

## 2021-10-19 NOTE — Telephone Encounter (Signed)
In the forms I have provided her up to 1 day off per week for migraine. If she is needing more that that amount of time she will need to be evaluated and treated by neurology. Please let me know if she would like me to order a referral to neurology.

## 2021-10-19 NOTE — Telephone Encounter (Signed)
Pt states the copies were cut off.  Please update form to state reduced hours from 8:30-3 until further notice.  Pt states that it was discussed with provider on Mychart messages.  Please advise

## 2021-10-29 ENCOUNTER — Encounter: Payer: Self-pay | Admitting: Nurse Practitioner

## 2021-10-29 ENCOUNTER — Ambulatory Visit (INDEPENDENT_AMBULATORY_CARE_PROVIDER_SITE_OTHER): Payer: No Typology Code available for payment source | Admitting: Nurse Practitioner

## 2021-10-29 VITALS — BP 138/90 | HR 89 | Temp 97.8°F | Ht 65.0 in | Wt 215.4 lb

## 2021-10-29 DIAGNOSIS — G43009 Migraine without aura, not intractable, without status migrainosus: Secondary | ICD-10-CM | POA: Diagnosis not present

## 2021-10-29 DIAGNOSIS — Z1211 Encounter for screening for malignant neoplasm of colon: Secondary | ICD-10-CM | POA: Diagnosis not present

## 2021-10-29 DIAGNOSIS — G620 Drug-induced polyneuropathy: Secondary | ICD-10-CM | POA: Diagnosis not present

## 2021-10-29 DIAGNOSIS — Z0001 Encounter for general adult medical examination with abnormal findings: Secondary | ICD-10-CM | POA: Diagnosis not present

## 2021-10-29 DIAGNOSIS — T451X5A Adverse effect of antineoplastic and immunosuppressive drugs, initial encounter: Secondary | ICD-10-CM

## 2021-10-29 MED ORDER — NURTEC 75 MG PO TBDP: 1.0000 | ORAL_TABLET | ORAL | 3 refills | Status: DC

## 2021-10-29 NOTE — Assessment & Plan Note (Signed)
Overall exam within normal limits.  She is encouraged to continue trying to live a healthy lifestyle.  She was encouraged to consider tetanus vaccine but she would like to hold off on this for now, will consider hepatitis C and HIV screening in the future per her request.

## 2021-10-29 NOTE — Assessment & Plan Note (Signed)
Chronic, suboptimally controlled.  She will continue taking gabapentin as prescribed.  She is still awaiting ergonomic equipment from her job.  She was encouraged to discuss testing via EMG with neurology if symptoms persist or worsen.  She reports she is not interested in pursuing this at this time.

## 2021-10-29 NOTE — Assessment & Plan Note (Signed)
Cologuard ordered today for colon cancer screening.  She was educated that the colonoscopy is gold standard.  In that she would be recommended to undergo diagnostic colonoscopy if Cologuard results are positive.  She reports her understanding, further recommendations may be made based on these results.

## 2021-10-29 NOTE — Assessment & Plan Note (Signed)
Chronic, suboptimally controlled.  We will prescribe Nurtec 75 mg by mouth every other day for preventative therapy.  She will continue on her Imitrex as needed and amitriptyline at night.  Accommodation paperwork completed for her job which allows for 4 to 8 hours/week off for work related to migraines.  She was encouraged to consider evaluation with neurology if she feels she requires more time off work but she is decided not to pursue this at this time.

## 2021-10-29 NOTE — Progress Notes (Signed)
Established Patient Office Visit  Subjective   Patient ID: Susan Wilson, female    DOB: August 10, 1975  Age: 46 y.o. MRN: [id number]  Chief Complaint  Patient presents with   Annual Exam    Health maintenance: Last mammogram completed approximately 4 months ago which was negative for signs of malignancy.  She will be due for colon cancer screening, patient denies any personal or family history of colon cancer.  Also denies personal history of ulcerative colitis or Crohn's.  She would prefer Cologuard over colonoscopy if possible.  Due for tetanus shot, however she prefers not to have this administered today.  She would also be due for hepatitis C screening and HIV screening, however she would not like this done today.  Migraines: She continues to have frequent migraines, reporting 2 migraines a week.  She has taken Nurtec as needed with improvement in migraine pain.  She would like to try as preventative treatment.  She continues to also take Imitrex if needed for abortive therapy and amitriptyline at night.    Neuropathy: She is requesting work accommodation to allow her time off as needed related to migraines and/or neuropathy which she reports is secondary to chemotherapy for treatment of breast cancer.  She has not undergone EMG testing, however was recommended to have this completed by neurologist back in 2018.  She is awaiting on ergonomic equipment from her work.    Review of Systems  Constitutional:  Negative for fever and malaise/fatigue.  Respiratory:  Negative for cough and shortness of breath.   Cardiovascular:  Negative for chest pain and palpitations.  Gastrointestinal:  Negative for abdominal pain and blood in stool.  Neurological:  Positive for dizziness and headaches. Negative for loss of consciousness.  Psychiatric/Behavioral:  Negative for depression and suicidal ideas.       Objective:     BP 138/90 (BP Location: Left Arm, Patient Position: Sitting, Cuff Size:  Normal)   Pulse 89   Temp 97.8 F (36.6 C) (Oral)   Ht 5' 5"  (1.651 m)   Wt 215 lb 6.4 oz (97.7 kg)   LMP 09/20/2021   SpO2 97%   BMI 35.84 kg/m    Physical Exam Vitals reviewed. Exam conducted with a chaperone present.  Constitutional:      Appearance: Normal appearance.  HENT:     Head: Normocephalic and atraumatic.     Right Ear: Tympanic membrane, ear canal and external ear normal.     Left Ear: Tympanic membrane, ear canal and external ear normal.  Eyes:     General:        Right eye: No discharge.        Left eye: No discharge.     Extraocular Movements: Extraocular movements intact.     Conjunctiva/sclera: Conjunctivae normal.     Pupils: Pupils are equal, round, and reactive to light.  Neck:     Vascular: No carotid bruit.  Cardiovascular:     Rate and Rhythm: Normal rate and regular rhythm.     Pulses: Normal pulses.     Heart sounds: Normal heart sounds. No murmur heard. Pulmonary:     Effort: Pulmonary effort is normal.     Breath sounds: Normal breath sounds.  Chest:  Breasts:    Breasts are symmetrical.     Right: Normal.     Left: Normal.  Abdominal:     General: Abdomen is flat. Bowel sounds are normal. There is no distension.     Palpations: Abdomen  is soft. There is no mass.     Tenderness: There is no abdominal tenderness.  Musculoskeletal:        General: No tenderness.     Cervical back: Neck supple. No muscular tenderness.     Right lower leg: No edema.     Left lower leg: No edema.  Lymphadenopathy:     Cervical: No cervical adenopathy.     Upper Body:     Right upper body: No supraclavicular adenopathy.     Left upper body: No supraclavicular adenopathy.  Skin:    General: Skin is warm and dry.  Neurological:     General: No focal deficit present.     Mental Status: She is alert and oriented to person, place, and time.     Motor: No weakness.     Gait: Gait normal.  Psychiatric:        Mood and Affect: Mood normal.         Behavior: Behavior normal.        Judgment: Judgment normal.      No results found for any visits on 10/29/21.    The 10-year ASCVD risk score (Arnett DK, et al., 2019) is: 1.9%    Assessment & Plan:   Problem List Items Addressed This Visit       Cardiovascular and Mediastinum   Migraine   Relevant Medications   Rimegepant Sulfate (NURTEC) 75 MG TBDP     Other   Colon cancer screening   Relevant Orders   Cologuard   Encounter for general adult medical examination with abnormal findings - Primary    Return in about 3 months (around 01/29/2022) for 3-42month or f/u with SJudson Roch   In addition to performing her annual physical exam I also performed an office visit as addressed above. SAilene Ards NP

## 2021-11-02 ENCOUNTER — Encounter: Payer: Self-pay | Admitting: Nurse Practitioner

## 2021-11-02 ENCOUNTER — Telehealth: Payer: Self-pay | Admitting: Nurse Practitioner

## 2021-11-02 ENCOUNTER — Other Ambulatory Visit: Payer: Self-pay | Admitting: Nurse Practitioner

## 2021-11-02 DIAGNOSIS — G43009 Migraine without aura, not intractable, without status migrainosus: Secondary | ICD-10-CM

## 2021-11-02 NOTE — Telephone Encounter (Signed)
Pt called in and states Rimegepant Sulfate (NURTEC) 75 MG TBDP is not covered by insurance.   Requesting an alternative be called in or possibly start a PA.   [id number]  [phone] Fax:  [phone]

## 2021-11-03 ENCOUNTER — Encounter: Payer: Self-pay | Admitting: Nurse Practitioner

## 2021-11-08 NOTE — Telephone Encounter (Signed)
Submitted PA via cover-my-meds w/ (Key: [id number]). Rec'd msg " .Marland KitchenRaechel Chute"

## 2021-11-10 NOTE — Telephone Encounter (Signed)
Rec'd determination fax med was DENIED. It states must try alternative first Aimovig, Emgality,../lmb

## 2021-11-11 NOTE — Telephone Encounter (Deleted)
Please advise, or should she contact her insurance to see what they are able to cover.

## 2021-11-12 ENCOUNTER — Other Ambulatory Visit: Payer: Self-pay | Admitting: Hematology and Oncology

## 2021-11-12 ENCOUNTER — Other Ambulatory Visit: Payer: Self-pay | Admitting: Adult Health

## 2021-11-12 DIAGNOSIS — C50311 Malignant neoplasm of lower-inner quadrant of right female breast: Secondary | ICD-10-CM

## 2021-11-24 LAB — COLOGUARD: COLOGUARD: NEGATIVE

## 2021-12-02 ENCOUNTER — Other Ambulatory Visit: Payer: Self-pay | Admitting: Nurse Practitioner

## 2021-12-02 DIAGNOSIS — G43009 Migraine without aura, not intractable, without status migrainosus: Secondary | ICD-10-CM

## 2021-12-04 ENCOUNTER — Other Ambulatory Visit: Payer: Self-pay | Admitting: Adult Health

## 2021-12-04 DIAGNOSIS — C50311 Malignant neoplasm of lower-inner quadrant of right female breast: Secondary | ICD-10-CM

## 2021-12-06 ENCOUNTER — Other Ambulatory Visit: Payer: Self-pay | Admitting: Adult Health

## 2021-12-06 DIAGNOSIS — C50311 Malignant neoplasm of lower-inner quadrant of right female breast: Secondary | ICD-10-CM

## 2021-12-09 ENCOUNTER — Other Ambulatory Visit: Payer: Self-pay | Admitting: Nurse Practitioner

## 2021-12-09 DIAGNOSIS — G43009 Migraine without aura, not intractable, without status migrainosus: Secondary | ICD-10-CM

## 2021-12-09 MED ORDER — RIZATRIPTAN BENZOATE 5 MG PO TABS: 5.0000 mg | ORAL_TABLET | ORAL | 0 refills | Status: DC | PRN

## 2021-12-20 ENCOUNTER — Encounter: Payer: Self-pay | Admitting: Hematology and Oncology

## 2021-12-20 ENCOUNTER — Inpatient Hospital Stay
Payer: No Typology Code available for payment source | Attending: Hematology and Oncology | Admitting: Hematology and Oncology

## 2021-12-20 ENCOUNTER — Other Ambulatory Visit: Payer: Self-pay

## 2021-12-20 VITALS — BP 135/87 | HR 85 | Temp 97.7°F | Resp 16 | Ht 65.0 in | Wt 219.9 lb

## 2021-12-20 DIAGNOSIS — Z923 Personal history of irradiation: Secondary | ICD-10-CM | POA: Diagnosis not present

## 2021-12-20 DIAGNOSIS — Z171 Estrogen receptor negative status [ER-]: Secondary | ICD-10-CM | POA: Insufficient documentation

## 2021-12-20 DIAGNOSIS — Z853 Personal history of malignant neoplasm of breast: Secondary | ICD-10-CM | POA: Insufficient documentation

## 2021-12-20 DIAGNOSIS — C50311 Malignant neoplasm of lower-inner quadrant of right female breast: Secondary | ICD-10-CM

## 2021-12-20 NOTE — Progress Notes (Signed)
[place]  [phone] [phone]     ID: Susan Wilson DOB: 1976-01-08  MR#: [id number]  JKK#:[id number]  Patient Care Team: Ailene Ards, NP as PCP - General (Nurse Practitioner) Rolm Bookbinder, MD as Consulting Physician (General Surgery) Alda Berthold, DO as Consulting Physician (Neurology) Irene Limbo, MD as Consulting Physician (Plastic Surgery) Benay Pike, MD as Consulting Physician (Hematology and Oncology) OTHER MD:  CHIEF COMPLAINT: Triple negative breast cancer  CURRENT TREATMENT: Observation  INTERVAL HISTORY:   Susan Wilson is here today for follow-up of Susan Wilson history of triple negative breast cancer.  Susan Wilson most recent mammogram was completed on June 16, 2021.  Susan Wilson mammogram showed no suspicious masses calcification or distortion in either breast and the right lumpectomy site was stable with fat necrosis and oil cysts.  Susan Wilson breast density was category B.   Since last visit, no breast changes. Susan Wilson migraine medicine is being titrated. Susan Wilson continues to have neuropathy from chemotherapy, some dizziness with postural change( once again chronic)  REVIEW OF SYSTEMS: Review of Systems  Constitutional:  Negative for appetite change, chills, fatigue, fever and unexpected weight change.  HENT:   Negative for hearing loss, lump/mass and trouble swallowing.   Eyes:  Negative for eye problems and icterus.  Respiratory:  Negative for chest tightness, cough and shortness of breath.   Cardiovascular:  Negative for chest pain, leg swelling and palpitations.  Gastrointestinal:  Negative for abdominal distention, abdominal pain, constipation, diarrhea, nausea and vomiting.  Endocrine: Negative for hot flashes.  Genitourinary:  Negative for difficulty urinating.   Musculoskeletal:  Negative for arthralgias.  Skin:  Negative for itching and rash.  Neurological:  Negative for dizziness, extremity weakness, headaches and numbness.   Hematological:  Negative for adenopathy. Does not bruise/bleed easily.  Psychiatric/Behavioral:  Negative for depression. The patient is not nervous/anxious.       COVID 19 VACCINATION STATUS: refuses vaccination   BREAST CANCER HISTORY: From the original intake note:  Sibyl herself noted a mass in Susan Wilson right breast sometime around April. Initially Susan Wilson thought it might be related to menstruation, but as it did not change and eventually became tender, Susan Wilson brought it to Susan Wilson physician's attention. On 11/20/2014 patient underwent bilateral diagnostic mammography with tomosynthesis and right breast ultrasonography at the breast Center. The breast density was category B. There was a hyperdense mass in the right lower inner quadrant associated with skin thickening. There was also a 5 mm nodule posteriorly at the 8:30 o'clock position in the right breast. There were several hyperdense nodules in the right axilla. On physical exam there was a firm fixed mass in the right breast at the 5:00 position. By ultrasound this was lobulated and appear to involve the skin. It measures up to 4.1 cm. There was no sonographic correlation to the 5 mm nodule seen in a different area of the right breast. The right axilla showed 3 hypervascular lymph nodes with prominent cortical thickening, measuring less than 1.5 cm.  On 11/21/2014 the patient underwent right breast biopsy (5:00 mass) and biopsy of one of the suspicious right axillary lymph nodes. The pathology (SAA [id number]) showed the breast biopsy to consist of invasive ductal carcinoma, grade 2, estrogen receptor and progesterone receptor negative, with an MIB-1 of 20%, and Susan Wilson-2 equivocal, with the signals ratio of 1.41, but the average copy number per cell 4.35.  The patient's subsequent history is as detailed below   PAST MEDICAL HISTORY: Past Medical History:  Diagnosis Date  Anemia    Breast asymmetry 12/2015   Breast cancer ([place]) 2017   right breast    History of breast cancer 11/2014   right   History of chemotherapy    Migraines    Neuropathy due to chemotherapeutic drug ([place])    fingers and toes   Personal history of chemotherapy 2017   Personal history of radiation therapy 2017    PAST SURGICAL HISTORY: Past Surgical History:  Procedure Laterality Date   BREAST LUMPECTOMY Right 2017   BREAST RECONSTRUCTION Right 06/23/2015   Procedure: ONCOPLASTIC RIGHT BREAST REDUCTION;  Surgeon: Irene Limbo, MD;  Location: [facility];  Service: Plastics;  Laterality: Right;   BREAST REDUCTION SURGERY Left 06/23/2015   Procedure: MAMMARY REDUCTION  (BREAST) LEFT BREAST FOR SYMETRY (BILATERAL BREAST REDUCTION);  Surgeon: Irene Limbo, MD;  Location: [facility];  Service: Plastics;  Laterality: Left;   BREAST REDUCTION SURGERY Left 01/15/2016   Procedure: MAMMARY REDUCTION  (BREAST) LEFT;  Surgeon: Irene Limbo, MD;  Location: [facility];  Service: Plastics;  Laterality: Left;   MASTOPEXY Left 01/15/2016   Procedure: POSSIBLE LEFT MASTOPEXY;  Surgeon: Irene Limbo, MD;  Location: [facility];  Service: Plastics;  Laterality: Left;   PORT-A-CATH REMOVAL Right 01/15/2016   Procedure: REMOVAL OF RIGHT PORT-A-CATH;  Surgeon: Irene Limbo, MD;  Location: [facility];  Service: Plastics;  Laterality: Right;   PORTACATH PLACEMENT Right 12/16/2014   Procedure: INSERTION PORT-A-CATH WITH ULTRASOUND;  Surgeon: Rolm Bookbinder, MD;  Location: [facility];  Service: General;  Laterality: Right;   RADIOACTIVE SEED GUIDED PARTIAL MASTECTOMY WITH AXILLARY SENTINEL LYMPH NODE BIOPSY Right 06/15/2015   Procedure: RADIOACTIVE SEED GUIDED PARTIAL MASTECTOMY WITH AXILLARY SENTINEL LYMPH NODE BIOPSY;  Surgeon: Rolm Bookbinder, MD;  Location: [facility];  Service: General;  Laterality: Right;   REDUCTION MAMMAPLASTY Bilateral 2017    FAMILY HISTORY Family History  Problem  Relation Age of Onset   Mesothelioma Maternal Grandfather        asbestos exposure, died in his 6s   Heart defect Sister 0   Breast cancer Paternal Grandmother    Diabetes Paternal Grandmother    Cancer Paternal Grandfather        NOS   Colon cancer Other 8       MGMs brother with colon cancer   Cancer Other        several of MGF's sisters with cancer NOS   Liver cancer Other        MGF's brother   Heart disease Paternal Aunt    Heart disease Paternal Uncle   The patient's parents are living, Susan Wilson father being 5 and Susan Wilson mother 31 as of July 2016. The patient had no brothers. One sister died at age 51 from cardiac problems. The other sister is in good health. On the maternal side there is a history of colon cancer and an uncle age 51, liver cancer in a great uncle and mesothelioma in the maternal grandfather.   GYNECOLOGIC HISTORY:  No LMP recorded. Menarche age 19. The patient is GX P2. Susan Wilson periods were interrupted with chemotherapy, but have resumed (August 2018), although now very scant and brief   SOCIAL HISTORY: (Updated 07/04/2018) Susan Wilson works at [employer]. Susan Wilson formerly worked in Therapist, art for [employer]. Susan Wilson husband Antrell works for [employer]. The daughters are [place] and Lovie Macadamia, age 82 and 63 as of 06/2018. Arville Lime will be going to college for photography and videography.  The patient attends a local [employer]: Not in place   HEALTH MAINTENANCE: Social History   Tobacco Use   Smoking status: Never   Smokeless tobacco: Never  Substance Use Topics   Alcohol use: Yes    Comment: rarely   Drug use: No     Colonoscopy: n/a (age)  PAP: 12/2018, HPV+, atypical cells / biopsy negative  Bone density: n/a (age)  Lipid panel:  No Known Allergies  Current Outpatient Medications  Medication Sig Dispense Refill   b complex vitamins tablet Take 1 tablet by mouth daily.     ferrous sulfate 325 (65 FE) MG  tablet Take 325 mg by mouth daily with breakfast.     gabapentin (NEURONTIN) 300 MG capsule Take 1 capsule (300 mg total) by mouth at bedtime. 90 capsule 1   meloxicam (MOBIC) 15 MG tablet TAKE 1 TABLET (15 MG) BY MOUTH DAILY 30 tablet 1   methocarbamol (ROBAXIN) 500 MG tablet Take 1 tablet (500 mg total) by mouth 2 (two) times daily. 20 tablet 0   nortriptyline (PAMELOR) 10 MG capsule TAKE 1 CAPSULE BY MOUTH EVERYDAY AT BEDTIME 90 capsule 1   ondansetron (ZOFRAN) 4 MG tablet TAKE 1 TABLET BY MOUTH EVERY 8 HOURS AS NEEDED FOR NAUSEA AND VOMITING 30 tablet 0   rizatriptan (MAXALT) 5 MG tablet Take 1 tablet (5 mg total) by mouth as needed for migraine. May repeat in 2 hours if needed 10 tablet 0   vitamin E (VITAMIN E) 400 UNIT capsule Take 1 capsule (400 Units total) by mouth daily. 30 capsule 0   No current facility-administered medications for this visit.    OBJECTIVE: African-American Wilson who appears stated age 45:   12/20/21 1154  BP: 135/87  Pulse: 85  Resp: 16  Temp: 97.7 F (36.5 C)  SpO2: 100%      Body mass index is 36.59 kg/m.     Physical Exam Constitutional:      Appearance: Normal appearance.  Chest:     Comments: Bilateral breast examined. Post surgical changes noted. No palpable masses or regional adenopathy Musculoskeletal:     Cervical back: Normal range of motion and neck supple. No rigidity.  Lymphadenopathy:     Cervical: No cervical adenopathy.  Neurological:     Mental Status: Susan Wilson is alert.      LAB RESULTS:  CMP     Latest Ref Rng & Units 10/07/2021   10:28 AM 07/16/2019    9:11 AM 07/04/2018    2:03 PM  CBC  WBC 4.0 - 10.5 K/uL 6.1  5.6  6.3   Hemoglobin 12.0 - 15.0 g/dL 12.0  12.4  11.5   Hematocrit 36.0 - 46.0 % 37.7  40.1  37.5   Platelets 150.0 - 400.0 K/uL 292.0  254  269       Latest Ref Rng & Units 10/07/2021   10:28 AM 07/16/2019    9:11 AM 07/04/2018    2:03 PM  CMP  Glucose 70 - 99 mg/dL 97  103  95   BUN 6 - 23 mg/dL 15   10  11    Creatinine 0.40 - 1.20 mg/dL 0.91  0.74  0.79   Sodium 135 - 145 mEq/L 136  137  139   Potassium 3.5 - 5.1 mEq/L 4.0  4.1  3.7   Chloride 96 - 112 mEq/L 101  105  105   CO2 19 - 32 mEq/L 30  23  26  Calcium 8.4 - 10.5 mg/dL 9.5  8.7  9.0   Total Protein 6.0 - 8.3 g/dL 7.9  7.9  7.8   Total Bilirubin 0.2 - 1.2 mg/dL 0.7  0.7  0.5   Alkaline Phos 39 - 117 U/L 56  64  74   AST 0 - 37 U/L 14  17  11    ALT 0 - 35 U/L 12  15  10        Urinalysis  STUDIES: No results found.   ASSESSMENT: 46 y.o. BRCA negative Susan Wilson s/p Right breast biopsy lower inner quadrant 11/21/2014 for a clinical T2 NX, stage 2 invasive ductal carcinoma, grade 2 or 3, estrogen and progesterone receptor negative, Susan Wilson-2 equivocal, with an Mib-1 of 20%  (a) biopsy of a suspicious axillary lymph node same day was negative, but discordant  (b) review of 80 additional tumor cells by FISH still showed Susan Wilson-2 not amplified; tumor should be treated as a triple negative  (1) neoadjuvant chemotherapy started 01/01/2015 consisting of cyclophosphamide and doxorubicin in dose dense fashion 4, completed 02/13/2015, followed by paclitaxel/ carboplatin weekly 12 started 02/26/2015, completed 05/14/2015.   (2) genetics testing 12/15/2014 through the Breast/Ovarian gene panel offered by [facility] found no deleterious mutations in ATM, BARD1, BRCA1, BRCA2, BRIP1, CDH1, CHEK2, EPCAM, FANCC, MLH1, MSH2, MSH6, NBN, PALB2, PMS2, PTEN, RAD51C, RAD51D, TP53, and XRCC2  (3) right lumpectomy and sentinel lymph node sampling 06/15/2015 showed a residual pT1b pN0 invasive ductal carcinoma, grade 1, with negative margins. (There were actually a few microscopic nests of residual tumor and 0.9 is the longest distance between 2 of the microscopic nests--there was not enough tissue for repeat prognostic panel.)  (a) bilateral reduction mammoplasty 06/23/2015 showed no malignancy in either breast  (4) adjuvant radiation  09/01/2015-10/19/2015:     Right breast / 45 Gray @ 1.8 Pearline Cables per fraction x 25 fractions Right breast boost / 16 Gray at [employer] per fraction x 8 fractions    PLAN: Venicia is here today for follow-up of Susan Wilson history of stage IIa estrogen negative breast cancer.  Susan Wilson is status post lumpectomy, reconstruction, chemotherapy, and radiation.  Susan Wilson has no clinical or radiographic sign of breast cancer recurrence.   Susan Wilson continues to feel well for the most part except for migraines and chemotherapy induced peripheral neuropathy. 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 RTC in one yr or sooner as needed.  Total time spent: 30 minutes *Total Encounter Time as defined by the Centers for Medicare and Medicaid Services includes, in addition to the face-to-face time of a patient visit (documented in the note above) non-face-to-face time: obtaining and reviewing outside history, ordering and reviewing medications, tests or procedures, care coordination (communications with other health care professionals or caregivers) and documentation in the medical record.

## 2022-01-07 ENCOUNTER — Other Ambulatory Visit: Payer: Self-pay | Admitting: Nurse Practitioner

## 2022-01-07 DIAGNOSIS — G43009 Migraine without aura, not intractable, without status migrainosus: Secondary | ICD-10-CM

## 2022-01-10 ENCOUNTER — Encounter: Payer: Self-pay | Admitting: Hematology and Oncology

## 2022-01-11 ENCOUNTER — Encounter: Payer: Self-pay | Admitting: Nurse Practitioner

## 2022-01-21 ENCOUNTER — Telehealth: Payer: Self-pay

## 2022-01-21 NOTE — Telephone Encounter (Signed)
Spoke with Patient regarding Forms received for FMLA and for Reasonable Accommodation. Patient stated that she did not need the Reasonable Accommodation Form (Long Form) filled out that she could use the Short  FMLA Form per her workplace. FMLA Form completed and forwarded to provider for review and signature. No other needs or concerns voiced at this time.

## 2022-01-28 ENCOUNTER — Telehealth: Payer: Self-pay

## 2022-01-28 NOTE — Telephone Encounter (Signed)
Notified Patient of completion of LOA/FMLA paperwork. Fax transmission confirmation received. Copy of forms mailed to Patient as requested. No other needs or concerns voiced at this time.

## 2022-02-03 ENCOUNTER — Ambulatory Visit (INDEPENDENT_AMBULATORY_CARE_PROVIDER_SITE_OTHER): Payer: No Typology Code available for payment source | Admitting: Nurse Practitioner

## 2022-02-03 ENCOUNTER — Other Ambulatory Visit (INDEPENDENT_AMBULATORY_CARE_PROVIDER_SITE_OTHER): Payer: No Typology Code available for payment source

## 2022-02-03 VITALS — BP 148/96 | HR 85 | Temp 98.0°F | Ht 65.0 in | Wt 212.2 lb

## 2022-02-03 DIAGNOSIS — Z1159 Encounter for screening for other viral diseases: Secondary | ICD-10-CM | POA: Diagnosis not present

## 2022-02-03 DIAGNOSIS — R944 Abnormal results of kidney function studies: Secondary | ICD-10-CM | POA: Diagnosis not present

## 2022-02-03 DIAGNOSIS — R030 Elevated blood-pressure reading, without diagnosis of hypertension: Secondary | ICD-10-CM

## 2022-02-03 DIAGNOSIS — E785 Hyperlipidemia, unspecified: Secondary | ICD-10-CM | POA: Diagnosis not present

## 2022-02-03 DIAGNOSIS — Z113 Encounter for screening for infections with a predominantly sexual mode of transmission: Secondary | ICD-10-CM

## 2022-02-03 DIAGNOSIS — E669 Obesity, unspecified: Secondary | ICD-10-CM | POA: Diagnosis not present

## 2022-02-03 LAB — LIPID PANEL
Cholesterol: 168 mg/dL (ref 0–200)
HDL: 46.6 mg/dL (ref >39.00)
LDL Cholesterol: 112 mg/dL — ABNORMAL HIGH (ref 0–99)
NonHDL: 121.84
Total CHOL/HDL Ratio: 4
Triglycerides: 48 mg/dL (ref 0.0–149.0)
VLDL: 9.6 mg/dL (ref 0.0–40.0)

## 2022-02-03 LAB — BASIC METABOLIC PANEL
BUN: 11 mg/dL (ref 6–23)
CO2: 28 mEq/L (ref 19–32)
Calcium: 9.6 mg/dL (ref 8.4–10.5)
Chloride: 100 mEq/L (ref 96–112)
Creatinine, Ser: 0.76 mg/dL (ref 0.40–1.20)
GFR: 93.73 mL/min (ref >60.00)
Glucose, Bld: 94 mg/dL (ref 70–99)
Potassium: 3.9 mEq/L (ref 3.5–5.1)
Sodium: 135 mEq/L (ref 135–145)

## 2022-02-03 LAB — MICROALBUMIN / CREATININE URINE RATIO
Creatinine,U: 55.6 mg/dL
Microalb Creat Ratio: 1.3 mg/g (ref 0.0–30.0)
Microalb, Ur: <0.7 mg/dL (ref 0.0–1.9)

## 2022-02-03 NOTE — Assessment & Plan Note (Signed)
Fasting lipid panel ordered today. Further recommendations may be made based on these results.

## 2022-02-03 NOTE — Progress Notes (Signed)
Established Patient Office Visit  Subjective   Patient ID: Susan Wilson, female    DOB: 07/10/1975  Age: 46 y.o. MRN: [id number]  Chief Complaint  Patient presents with   Migraine    Patient has today for the above.  Migraines: She continues on Nurtec 1 tablet every other day for preventative therapy as well as nortriptyline at night.  She reports this has helped her migraines and they are not occurring quite as frequently or as severely when they do occur.  She also now has ergonomic equipment for work which has been helpful as well.  Health maintenance: She is due for screening labs and health maintenance visit in order to complete form that is required by her job.  She would not like the flu shot administered today.  She reports that she has been under a bit of stress lately as she is helping a feeling member with moving.  Per chart review last time GFR was checked it was slightly decreased at 75.  She is also due for hepatitis C screening and HIV screening.     Review of Systems  Respiratory:  Negative for shortness of breath.   Cardiovascular:  Negative for chest pain.  Neurological:  Positive for dizziness.      Objective:     BP (!) 148/96   Pulse 85   Temp 98 F (36.7 C) (Oral)   Ht '5\' 5"'$  (1.651 m)   Wt 212 lb 4 oz (96.3 kg)   SpO2 99%   BMI 35.32 kg/m  BP Readings from Last 3 Encounters:  02/03/22 (!) 148/96  12/20/21 135/87  10/29/21 138/90   Wt Readings from Last 3 Encounters:  02/03/22 212 lb 4 oz (96.3 kg)  12/20/21 219 lb 14.4 oz (99.7 kg)  10/29/21 215 lb 6.4 oz (97.7 kg)      Physical Exam Vitals reviewed.  Constitutional:      General: She is not in acute distress.    Appearance: Normal appearance.  HENT:     Head: Normocephalic and atraumatic.  Neck:     Vascular: No carotid bruit.  Cardiovascular:     Rate and Rhythm: Normal rate and regular rhythm.     Pulses: Normal pulses.     Heart sounds: Normal heart sounds.  Pulmonary:      Effort: Pulmonary effort is normal.     Breath sounds: Normal breath sounds.  Skin:    General: Skin is warm and dry.  Neurological:     General: No focal deficit present.     Mental Status: She is alert and oriented to person, place, and time.  Psychiatric:        Mood and Affect: Mood normal.        Behavior: Behavior normal.        Judgment: Judgment normal.      No results found for any visits on 02/03/22.    The 10-year ASCVD risk score (Arnett DK, et al., 2019) is: 2.6%    Assessment & Plan:   Problem List Items Addressed This Visit       Other   Decreased GFR - Primary    Recheck metabolic panel as well as check urine for albuminuria. Further recommendations may be made based on these results.        Relevant Orders   Basic metabolic panel   Microalbumin / creatinine urine ratio   Encounter for hepatitis C screening test for low risk patient    Hep  C antibody ordered today. Further recommendations may be made based on these results.        Relevant Orders   Hepatitis C antibody   Routine screening for STI (sexually transmitted infection)    HIV antibody ordered today.Further recommendations may be made based on these results.        Relevant Orders   HIV Antibody (routine testing w rflx)   Hyperlipidemia    Fasting lipid panel ordered today. Further recommendations may be made based on these results.        Relevant Orders   Lipid panel   Hemoglobin A1c   Obesity with serious comorbidity    Labs ordered today for further evaluation, Further recommendations may be made based on these results.        Relevant Orders   Hemoglobin A1c   Elevated blood pressure reading    Blood pressure elevated today, did improve slightly upon resting.  Patient does report she is under a lot of stress lately.  Blood pressures been much better in the past, she is asymptomatic and not currently on medication to manage.  Patient will follow-up in a few weeks, if  blood pressure still elevated may consider initiating current medication.  Patient reports understanding.       Return in about 3 months (around 05/05/2022) for With Deklyn Trachtenberg.    Ailene Ards, NP

## 2022-02-03 NOTE — Assessment & Plan Note (Signed)
Recheck metabolic panel as well as check urine for albuminuria. Further recommendations may be made based on these results.

## 2022-02-03 NOTE — Assessment & Plan Note (Signed)
Hep C antibody ordered today. Further recommendations may be made based on these results.

## 2022-02-03 NOTE — Assessment & Plan Note (Signed)
Labs ordered today for further evaluation, Further recommendations may be made based on these results.

## 2022-02-03 NOTE — Assessment & Plan Note (Signed)
HIV antibody ordered today.Further recommendations may be made based on these results.

## 2022-02-03 NOTE — Assessment & Plan Note (Signed)
Blood pressure elevated today, did improve slightly upon resting.  Patient does report she is under a lot of stress lately.  Blood pressures been much better in the past, she is asymptomatic and not currently on medication to manage.  Patient will follow-up in a few weeks, if blood pressure still elevated may consider initiating current medication.  Patient reports understanding.

## 2022-02-04 LAB — HEPATITIS C ANTIBODY: Hepatitis C Ab: NONREACTIVE

## 2022-02-04 LAB — HIV ANTIBODY (ROUTINE TESTING W REFLEX): HIV 1&2 Ab, 4th Generation: NONREACTIVE

## 2022-02-05 ENCOUNTER — Other Ambulatory Visit: Payer: Self-pay | Admitting: Nurse Practitioner

## 2022-02-05 DIAGNOSIS — G43009 Migraine without aura, not intractable, without status migrainosus: Secondary | ICD-10-CM

## 2022-02-07 LAB — HEMOGLOBIN A1C: Hgb A1c MFr Bld: 5.6 % (ref 4.6–6.5)

## 2022-02-09 ENCOUNTER — Encounter: Payer: Self-pay | Admitting: Nurse Practitioner

## 2022-02-12 ENCOUNTER — Other Ambulatory Visit: Payer: Self-pay | Admitting: Adult Health

## 2022-02-12 ENCOUNTER — Other Ambulatory Visit: Payer: Self-pay | Admitting: Hematology and Oncology

## 2022-02-12 DIAGNOSIS — C50311 Malignant neoplasm of lower-inner quadrant of right female breast: Secondary | ICD-10-CM

## 2022-02-14 ENCOUNTER — Other Ambulatory Visit: Payer: Self-pay | Admitting: Nurse Practitioner

## 2022-02-14 ENCOUNTER — Other Ambulatory Visit: Payer: Self-pay | Admitting: *Deleted

## 2022-02-14 ENCOUNTER — Encounter: Payer: Self-pay | Admitting: Hematology and Oncology

## 2022-02-14 DIAGNOSIS — C50311 Malignant neoplasm of lower-inner quadrant of right female breast: Secondary | ICD-10-CM

## 2022-02-14 DIAGNOSIS — G43009 Migraine without aura, not intractable, without status migrainosus: Secondary | ICD-10-CM

## 2022-02-14 MED ORDER — MELOXICAM 15 MG PO TABS: ORAL_TABLET | ORAL | 1 refills | Status: DC

## 2022-02-17 ENCOUNTER — Encounter: Payer: Self-pay | Admitting: Nurse Practitioner

## 2022-02-17 DIAGNOSIS — G43009 Migraine without aura, not intractable, without status migrainosus: Secondary | ICD-10-CM

## 2022-02-17 MED ORDER — RIZATRIPTAN BENZOATE 5 MG PO TABS: 5.0000 mg | ORAL_TABLET | ORAL | 0 refills | Status: DC | PRN

## 2022-02-21 ENCOUNTER — Other Ambulatory Visit: Payer: Self-pay | Admitting: Adult Health

## 2022-02-21 MED ORDER — ONDANSETRON HCL 4 MG PO TABS: ORAL_TABLET | ORAL | 0 refills | Status: DC

## 2022-02-23 ENCOUNTER — Encounter: Payer: Self-pay | Admitting: Hematology and Oncology

## 2022-03-16 ENCOUNTER — Other Ambulatory Visit: Payer: Self-pay | Admitting: Adult Health

## 2022-03-16 ENCOUNTER — Other Ambulatory Visit: Payer: Self-pay | Admitting: Nurse Practitioner

## 2022-03-16 ENCOUNTER — Telehealth: Payer: Self-pay | Admitting: *Deleted

## 2022-03-16 DIAGNOSIS — G43009 Migraine without aura, not intractable, without status migrainosus: Secondary | ICD-10-CM

## 2022-03-16 NOTE — Telephone Encounter (Signed)
Susan Wilson [phone]) 03/11/2022 E-mail from [email or web address] to [email or web address] reads "My employer received the last documentation but says it did not mention the additional break for meds or sickness we had on paperwork earlier this year. Can we add it on this new documentation for later part of the year? And I spoke with my nurse regarding the increased pain and numbness. I need to adjust my schedule to working 8:30am-2pm for the time being to limit aggravation to my condition."      [employer] [facility], "Do not know if the form sent in September can be updated to read as form in February so I attached the short accommodation version.  Need to have breaks for flare-ups to take half of Mobic because it makes me drowsy and Gabapentin.  Also need to reduce work hours to 8:30 am - 2:00 pm to not aggravate condition further and I am to resume therapy".      Completed 02/23/2022 portal attachment paperwork seen initially by this nurse during call.  Form to provider basket for collaborative pick up for provider review/update, signature and return to this nurse.

## 2022-03-22 NOTE — Telephone Encounter (Signed)
Signed [employer] Reasonable Accommodation form returned and received today by this nurse.  Successfully returned via fax.  E-mailed to [employer] at [email or web address] as requested.  Form to H.I.M bin designated for items to be scanned completes process.  No further instructions received, performed or required of this nurse.

## 2022-04-10 ENCOUNTER — Other Ambulatory Visit: Payer: Self-pay | Admitting: Hematology and Oncology

## 2022-04-10 DIAGNOSIS — C50311 Malignant neoplasm of lower-inner quadrant of right female breast: Secondary | ICD-10-CM

## 2022-05-05 ENCOUNTER — Ambulatory Visit (INDEPENDENT_AMBULATORY_CARE_PROVIDER_SITE_OTHER): Payer: No Typology Code available for payment source | Admitting: Nurse Practitioner

## 2022-05-05 VITALS — BP 142/88 | HR 90 | Temp 98.5°F | Ht 65.0 in | Wt 205.5 lb

## 2022-05-05 DIAGNOSIS — Z124 Encounter for screening for malignant neoplasm of cervix: Secondary | ICD-10-CM | POA: Diagnosis not present

## 2022-05-05 DIAGNOSIS — R634 Abnormal weight loss: Secondary | ICD-10-CM | POA: Diagnosis not present

## 2022-05-05 DIAGNOSIS — Z8742 Personal history of other diseases of the female genital tract: Secondary | ICD-10-CM

## 2022-05-05 DIAGNOSIS — G43009 Migraine without aura, not intractable, without status migrainosus: Secondary | ICD-10-CM

## 2022-05-05 DIAGNOSIS — R030 Elevated blood-pressure reading, without diagnosis of hypertension: Secondary | ICD-10-CM

## 2022-05-05 MED ORDER — RIZATRIPTAN BENZOATE 5 MG PO TABS: 5.0000 mg | ORAL_TABLET | ORAL | 0 refills | Status: DC | PRN

## 2022-05-05 NOTE — Assessment & Plan Note (Signed)
Blood pressure again above goal today.  Patient educated on potential risks of long-term uncontrolled hypertension.  I recommend that we initiate her on pharmacological therapy aimed at controlling her blood pressure, however patient requesting to have this rechecked after the holidays before she is prescribed antihypertensive.  For now per patient request we will hold off on pharmacological management.

## 2022-05-05 NOTE — Assessment & Plan Note (Signed)
Patient encouraged to eat 3 meals per day, and to monitor weight closely at home.  We will touch base in 6 weeks if she continues to lose weight unintentionally further evaluation will need to be completed.

## 2022-05-05 NOTE — Progress Notes (Signed)
Established Patient Office Visit  Subjective   Patient ID: Susan Wilson, female    DOB: [birth year]  Age: 46 y.o. MRN: [id number]  Chief Complaint  Patient presents with   Migraine    Migraine: Continues on nortriptyline for prevention and Maxalt for abortive therapy.  Requesting refill on Maxalt today.  Reports migraines are stable.  Health maintenance: Per chart review it appears that she is overdue for Pap smear.  She does have a history of abnormal Pap, however colposcopy came back with benign findings.  Up-to-date with mammogram which was completed in February 2023, no evidence of malignancy noted.  Unintentional weight loss: Reports that she has lost weight since last office visit.  Reports that this has been unintentional.  Elevated blood pressure: Noted at last office visit, patient had reported multiple emotional stressors which she thinks was contributing.  She is here to have this rechecked today.    Review of Systems  Respiratory:  Negative for shortness of breath.   Cardiovascular:  Negative for chest pain and palpitations.  Neurological:  Positive for sensory change (chronic chemotherapy-induced neuropathy) and headaches.  Psychiatric/Behavioral:  Negative for depression and suicidal ideas. The patient has insomnia. The patient is not nervous/anxious.       Objective:     BP (!) 142/88   Pulse 90   Temp 98.5 F (36.9 C) (Oral)   Ht '5\' 5"'$  (1.651 m)   Wt 205 lb 8 oz (93.2 kg)   SpO2 95%   BMI 34.20 kg/m  BP Readings from Last 3 Encounters:  05/05/22 (!) 142/88  02/03/22 (!) 148/96  12/20/21 135/87   Wt Readings from Last 3 Encounters:  05/05/22 205 lb 8 oz (93.2 kg)  02/03/22 212 lb 4 oz (96.3 kg)  12/20/21 219 lb 14.4 oz (99.7 kg)        05/05/2022   10:00 AM 02/03/2022    9:20 AM 10/07/2021    9:51 AM  PHQ9 SCORE ONLY  PHQ-9 Total Score 0 5 0     Physical Exam Vitals reviewed.  Constitutional:      General: She is not in acute  distress.    Appearance: Normal appearance.  HENT:     Head: Normocephalic and atraumatic.  Neck:     Vascular: No carotid bruit.  Cardiovascular:     Rate and Rhythm: Normal rate and regular rhythm.     Pulses: Normal pulses.     Heart sounds: Normal heart sounds.  Pulmonary:     Effort: Pulmonary effort is normal.     Breath sounds: Normal breath sounds.  Skin:    General: Skin is warm and dry.  Neurological:     General: No focal deficit present.     Mental Status: She is alert and oriented to person, place, and time.  Psychiatric:        Mood and Affect: Mood normal.        Behavior: Behavior normal.        Judgment: Judgment normal.      No results found for any visits on 05/05/22.    The 10-year ASCVD risk score (Arnett DK, et al., 2019) is: 2.4%    Assessment & Plan:   Problem List Items Addressed This Visit       Cardiovascular and Mediastinum   Migraine    Chronic, continue on nortriptyline daily as well as Maxalt as needed.  Patient educated to take 1 tablet of Maxalt as needed and that she  can repeat in 2 hours if migraine persists, but not to exceed more than 2 tablets in 24 hours.      Relevant Medications   rizatriptan (MAXALT) 5 MG tablet     Other   Elevated blood pressure reading    Blood pressure again above goal today.  Patient educated on potential risks of long-term uncontrolled hypertension.  I recommend that we initiate her on pharmacological therapy aimed at controlling her blood pressure, however patient requesting to have this rechecked after the holidays before she is prescribed antihypertensive.  For now per patient request we will hold off on pharmacological management.      Cervical cancer screening - Primary    Referring patient back to OB/GYN to perform colposcopy for Pap smear.      Relevant Orders   Ambulatory referral to Obstetrics / Gynecology   History of abnormal cervical Pap smear    Referral to OB/GYN made today.       Relevant Orders   Ambulatory referral to Obstetrics / Gynecology   Unintentional weight loss    Patient encouraged to eat 3 meals per day, and to monitor weight closely at home.  We will touch base in 6 weeks if she continues to lose weight unintentionally further evaluation will need to be completed.       Return in about 6 weeks (around 06/16/2022) for f/u with Shloime Keilman in person or virtual (weight).    Ailene Ards, NP

## 2022-05-05 NOTE — Assessment & Plan Note (Signed)
Referring patient back to OB/GYN to perform colposcopy for Pap smear.

## 2022-05-05 NOTE — Assessment & Plan Note (Signed)
Referral to OBGYN made today.  

## 2022-05-05 NOTE — Assessment & Plan Note (Signed)
Chronic, continue on nortriptyline daily as well as Maxalt as needed.  Patient educated to take 1 tablet of Maxalt as needed and that she can repeat in 2 hours if migraine persists, but not to exceed more than 2 tablets in 24 hours.

## 2022-05-20 ENCOUNTER — Other Ambulatory Visit: Payer: Self-pay | Admitting: Hematology and Oncology

## 2022-05-20 ENCOUNTER — Other Ambulatory Visit: Payer: Self-pay | Admitting: Adult Health

## 2022-05-20 DIAGNOSIS — C50311 Malignant neoplasm of lower-inner quadrant of right female breast: Secondary | ICD-10-CM

## 2022-06-01 ENCOUNTER — Other Ambulatory Visit: Payer: Self-pay | Admitting: Hematology and Oncology

## 2022-06-16 ENCOUNTER — Encounter: Payer: Self-pay | Admitting: Nurse Practitioner

## 2022-06-16 ENCOUNTER — Telehealth: Payer: No Typology Code available for payment source | Admitting: Nurse Practitioner

## 2022-06-16 DIAGNOSIS — R634 Abnormal weight loss: Secondary | ICD-10-CM

## 2022-06-16 NOTE — Progress Notes (Signed)
   Established Patient Office Visit  An audio/visual tele-health visit was completed today for this patient. I connected with  Susan Wilson on 06/16/22 utilizing audio/visual technology and verified that I am speaking with the correct person using two identifiers. The patient was located at their home, and I was located at the office of [place] at [facility] during the encounter. I discussed the limitations of evaluation and management by telemedicine. The patient expressed understanding and agreed to proceed.     Subjective   Patient ID: Susan Wilson, female    DOB: 1975/06/21  Age: 47 y.o. MRN: [id number]  Chief Complaint  Patient presents with   unintentional weight loss    Patient arrives for virtual visit to discuss unintentional weight loss.  Per chart review she is lost about 5% over the last 8 months.  On 10/29/2021 she weighed 215 pounds.  Last time I saw her about 6 weeks ago she was 205 pounds.  Today she is in the process of moving and does not have a scale available to weigh herself.  She reports last time she weighed herself was about 1 week ago but she does remember what her weight was.  Thus far testing has identified normal kidney function, no anemia, A1c at 5.6, TSH of 1.83, she had Cologuard completed in July 2023 which was negative, she is overdue for Pap smear.  She does have a history of breast cancer, last diagnostic mammogram was 06/16/2021 which was negative and she is scheduled to have 1 completed in approximately 10 days.  She reports she has been trying to eat more specifically more red meat which has resulted in more energy.  Overall she feels well.     Review of Systems  HENT:  Negative for congestion.   Respiratory:  Negative for cough and shortness of breath.       Objective:     There were no vitals taken for this visit. BP Readings from Last 3 Encounters:  05/05/22 (!) 142/88  02/03/22 (!) 148/96  12/20/21 135/87   Wt Readings  from Last 3 Encounters:  05/05/22 205 lb 8 oz (93.2 kg)  02/03/22 212 lb 4 oz (96.3 kg)  12/20/21 219 lb 14.4 oz (99.7 kg)      Physical Exam Comprehensive physical exam not completed today as office visit was conducted remotely.  Patient appears well over video.  Patient was alert and oriented, and appeared to have appropriate judgment.   No results found for any visits on 06/16/22.     The 10-year ASCVD risk score (Arnett DK, et al., 2019) is: 2.4%    Assessment & Plan:   Problem List Items Addressed This Visit       Other   Unintentional weight loss - Primary    Unfortunately I do not have a up-to-date weight available for this patient today to determine if she continues to lose weight or if it has stabilized.  Patient will come to office between now and her next appointment to have urinalysis completed as well as to check ESR and CRP.  Pending these results may consider additional testing.  Patient to follow-up with my soonest available appointment regarding her unintentional weight loss.      Relevant Orders   Urinalysis with Culture, if indicated   Sedimentation rate   C-reactive protein    Return for with Susan Wilson at soonest available.    Susan Ards, NP

## 2022-06-16 NOTE — Assessment & Plan Note (Signed)
Unfortunately I do not have a up-to-date weight available for this patient today to determine if she continues to lose weight or if it has stabilized.  Patient will come to office between now and her next appointment to have urinalysis completed as well as to check ESR and CRP.  Pending these results may consider additional testing.  Patient to follow-up with my soonest available appointment regarding her unintentional weight loss.

## 2022-07-08 ENCOUNTER — Telehealth: Payer: Self-pay | Admitting: *Deleted

## 2022-07-08 NOTE — Telephone Encounter (Signed)
FYI 07/05/2022 Late Entry: "[place], [phone] (home) I e-mailed the accommodation form renewal.  This was completed last year and has to be done every six months.  Want to make sure you complete form same as before. To work from home. Reduction of work hours from 8:30 am to 2:00 pm instead of regular hours till 5:00 pm. 15-minute medication break to take my medication. Five occurrences with eight-hour frequency.    Some information already on last form yet sent to me to update and have it resent."

## 2022-07-15 ENCOUNTER — Telehealth: Payer: Self-pay

## 2022-07-15 NOTE — Telephone Encounter (Signed)
Notified Patient of completion of [employer] Accommodation Form. Fax transmission confirmation received. Copy of form emailed to Patient as requested ([email or web address]). No other needs or concerns voiced at this time.

## 2022-07-18 ENCOUNTER — Other Ambulatory Visit: Payer: Self-pay | Admitting: Hematology and Oncology

## 2022-07-18 DIAGNOSIS — C50311 Malignant neoplasm of lower-inner quadrant of right female breast: Secondary | ICD-10-CM

## 2022-07-20 ENCOUNTER — Other Ambulatory Visit (INDEPENDENT_AMBULATORY_CARE_PROVIDER_SITE_OTHER): Payer: No Typology Code available for payment source

## 2022-07-20 ENCOUNTER — Other Ambulatory Visit: Payer: No Typology Code available for payment source

## 2022-07-20 DIAGNOSIS — R634 Abnormal weight loss: Secondary | ICD-10-CM

## 2022-07-20 LAB — URINALYSIS WITH CULTURE, IF INDICATED
Bilirubin Urine: NEGATIVE
Hgb urine dipstick: NEGATIVE
Ketones, ur: NEGATIVE
Nitrite: NEGATIVE
Specific Gravity, Urine: 1.025 (ref 1.000–1.030)
Total Protein, Urine: NEGATIVE
Urine Glucose: NEGATIVE
Urobilinogen, UA: 0.2 (ref 0.0–1.0)
pH: 6 (ref 5.0–8.0)

## 2022-07-20 LAB — C-REACTIVE PROTEIN: CRP: <1 mg/dL (ref 0.5–20.0)

## 2022-07-20 LAB — SEDIMENTATION RATE: Sed Rate: 52 mm/hr — ABNORMAL HIGH (ref 0–20)

## 2022-07-21 ENCOUNTER — Ambulatory Visit: Payer: No Typology Code available for payment source | Admitting: Nurse Practitioner

## 2022-07-21 LAB — URINE CULTURE
MICRO NUMBER:: 14656971
Result:: NO GROWTH
SPECIMEN QUALITY:: ADEQUATE

## 2022-07-22 ENCOUNTER — Other Ambulatory Visit: Payer: No Typology Code available for payment source

## 2022-07-22 ENCOUNTER — Ambulatory Visit: Payer: No Typology Code available for payment source | Admitting: Nurse Practitioner

## 2022-07-22 VITALS — BP 140/82 | HR 90 | Temp 98.0°F | Ht 65.0 in | Wt 208.4 lb

## 2022-07-22 DIAGNOSIS — Z113 Encounter for screening for infections with a predominantly sexual mode of transmission: Secondary | ICD-10-CM | POA: Diagnosis not present

## 2022-07-22 DIAGNOSIS — A599 Trichomoniasis, unspecified: Secondary | ICD-10-CM

## 2022-07-22 DIAGNOSIS — R634 Abnormal weight loss: Secondary | ICD-10-CM

## 2022-07-22 DIAGNOSIS — R8290 Unspecified abnormal findings in urine: Secondary | ICD-10-CM | POA: Insufficient documentation

## 2022-07-22 LAB — BASIC METABOLIC PANEL
BUN: 14 mg/dL (ref 6–23)
CO2: 27 mEq/L (ref 19–32)
Calcium: 9.1 mg/dL (ref 8.4–10.5)
Chloride: 103 mEq/L (ref 96–112)
Creatinine, Ser: 0.85 mg/dL (ref 0.40–1.20)
GFR: 81.69 mL/min (ref >60.00)
Glucose, Bld: 86 mg/dL (ref 70–99)
Potassium: 4.1 mEq/L (ref 3.5–5.1)
Sodium: 137 mEq/L (ref 135–145)

## 2022-07-22 LAB — SEDIMENTATION RATE: Sed Rate: 52 mm/hr — ABNORMAL HIGH (ref 0–20)

## 2022-07-22 LAB — URINALYSIS WITH CULTURE, IF INDICATED
Hgb urine dipstick: NEGATIVE
Nitrite: NEGATIVE
Specific Gravity, Urine: 1.015 (ref 1.000–1.030)
Total Protein, Urine: 30 — AB
Urine Glucose: NEGATIVE
Urobilinogen, UA: 4 — AB (ref 0.0–1.0)
pH: 7.5 (ref 5.0–8.0)

## 2022-07-22 MED ORDER — METRONIDAZOLE 500 MG PO TABS: 500.0000 mg | ORAL_TABLET | Freq: Two times a day (BID) | ORAL | 0 refills | Status: AC

## 2022-07-22 NOTE — Assessment & Plan Note (Signed)
Weight has stabilized. Per shared decision making will repeat sed rate today.  Order CT scan of chest abdomen and pelvis if still elevated.  If normalized will continue to monitor weight closely without initiating additional workup as of now.

## 2022-07-22 NOTE — Assessment & Plan Note (Signed)
Incidental finding, patient is asymptomatic. Treat with metronidazole 500 mg tablet twice a day x 1 week.

## 2022-07-22 NOTE — Assessment & Plan Note (Signed)
Labs ordered, further recommendations may be made based upon results.

## 2022-07-22 NOTE — Progress Notes (Signed)
Established Patient Office Visit  Subjective   Patient ID: Susan Wilson, female    DOB: [birth year]  Age: 47 y.o. MRN: [id number]  Chief Complaint  Patient presents with   Medical Management of Chronic Issues    Unintentional Weight loss: Was 219# 12/2021, down to 205# 04/2022, today at 208#. Has history of breast cancer, last mammogram 06/2021 showed no signs of breast cancer. Scheduled for next mammo on 08/17/22. Labs show normal and stable kidney function, no anemia, negative CRP, normal A1C, normal TSH, sed rate marginally elevated at 52, cologuard negative 11/2021. Due for pap, referred last in-person visit on 04/2022. Scheduled for appt with them next month.   Urine showed signs of trichomonas.  Patient reports no sexual activity for around 1 year.    Review of Systems  Constitutional:  Negative for diaphoresis, fever and malaise/fatigue.  Respiratory:  Negative for cough and shortness of breath.   Cardiovascular:  Negative for chest pain.  Genitourinary:  Negative for dysuria.  Neurological:  Positive for headaches (fluctuates).      Objective:     BP (!) 140/82   Pulse 90   Temp 98 F (36.7 C) (Temporal)   Ht '5\' 5"'$  (1.651 m)   Wt 208 lb 6 oz (94.5 kg)   SpO2 100%   BMI 34.68 kg/m w BP Readings from Last 3 Encounters:  07/22/22 (!) 140/82  05/05/22 (!) 142/88  02/03/22 (!) 148/96   Wt Readings from Last 3 Encounters:  07/22/22 208 lb 6 oz (94.5 kg)  05/05/22 205 lb 8 oz (93.2 kg)  02/03/22 212 lb 4 oz (96.3 kg)      Physical Exam Vitals reviewed.  Constitutional:      General: She is not in acute distress.    Appearance: Normal appearance.  HENT:     Head: Normocephalic and atraumatic.  Neck:     Vascular: No carotid bruit.  Cardiovascular:     Rate and Rhythm: Normal rate and regular rhythm.     Pulses: Normal pulses.     Heart sounds: Normal heart sounds.  Pulmonary:     Effort: Pulmonary effort is normal.     Breath sounds: Normal breath  sounds.  Skin:    General: Skin is warm and dry.  Neurological:     General: No focal deficit present.     Mental Status: She is alert and oriented to person, place, and time.  Psychiatric:        Mood and Affect: Mood normal.        Behavior: Behavior normal.        Judgment: Judgment normal.      Results for orders placed or performed in visit on 07/22/22  Urinalysis with Culture, if indicated   Specimen: Blood  Result Value Ref Range   Color, Urine YELLOW Yellow;Lt. Yellow;Straw;Dark Yellow;Amber;Green;Red;Brown   APPearance CLEAR Clear;Turbid;Slightly Cloudy;Cloudy   Specific Gravity, Urine 1.015 1.000 - 1.030   pH 7.5 5.0 - 8.0   Total Protein, Urine 30 (A) Negative   Urine Glucose NEGATIVE Negative   Ketones, ur TRACE (A) Negative   Bilirubin Urine SMALL (A) Negative   Hgb urine dipstick NEGATIVE Negative   Urobilinogen, UA 4.0 (A) 0.0 - 1.0   Leukocytes,Ua MODERATE (A) Negative   Nitrite NEGATIVE Negative   WBC, UA 21-50/hpf (A) 0-2/hpf   RBC / HPF 0-2/hpf 0-2/hpf   Mucus, UA Presence of (A) None   Squamous Epithelial / HPF Many(>10/hpf) (A) Rare(0-4/hpf)  Bacteria, UA Many(>50/hpf) (A) None  Basic metabolic panel  Result Value Ref Range   Sodium 137 135 - 145 mEq/L   Potassium 4.1 3.5 - 5.1 mEq/L   Chloride 103 96 - 112 mEq/L   CO2 27 19 - 32 mEq/L   Glucose, Bld 86 70 - 99 mg/dL   BUN 14 6 - 23 mg/dL   Creatinine, Ser 0.85 0.40 - 1.20 mg/dL   GFR 81.69 >60.00 mL/min   Calcium 9.1 8.4 - 10.5 mg/dL  Sedimentation rate  Result Value Ref Range   Sed Rate 52 (H) 0 - 20 mm/hr      The 10-year ASCVD risk score (Arnett DK, et al., 2019) is: 2.3%    Assessment & Plan:   Problem List Items Addressed This Visit       Other   Routine screening for STI (sexually transmitted infection)    Labs ordered, further recommendations may be made based upon results.      Relevant Orders   Sedimentation rate (Completed)   Urinalysis with Culture, if indicated  (Completed)   HIV Antibody (routine testing w rflx)   RPR   Basic metabolic panel (Completed)   Chlamydia/Neisseria Gonorrhoeae RNA,TMA,Urogenital   Unintentional weight loss - Primary    Weight has stabilized. Per shared decision making will repeat sed rate today.  Order CT scan of chest abdomen and pelvis if still elevated.  If normalized will continue to monitor weight closely without initiating additional workup as of now.       Relevant Orders   Sedimentation rate (Completed)   Urinalysis with Culture, if indicated (Completed)   HIV Antibody (routine testing w rflx)   RPR   Basic metabolic panel (Completed)   Trichomonas infection    Incidental finding, patient is asymptomatic. Treat with metronidazole 500 mg tablet twice a day x 1 week.      Relevant Medications   metroNIDAZOLE (FLAGYL) 500 MG tablet   Other Relevant Orders   Urinalysis with Culture, if indicated (Completed)   Urine cytology ancillary only   Chlamydia/Neisseria Gonorrhoeae RNA,TMA,Urogenital    Return in about 6 weeks (around 09/02/2022) for F/U with Czarina Gingras.    Ailene Ards, NP

## 2022-07-23 LAB — URINE CULTURE
MICRO NUMBER:: 14668352
SPECIMEN QUALITY:: ADEQUATE

## 2022-07-24 ENCOUNTER — Encounter: Payer: Self-pay | Admitting: Nurse Practitioner

## 2022-07-24 LAB — RPR: RPR Ser Ql: NONREACTIVE

## 2022-07-24 LAB — CHLAMYDIA/NEISSERIA GONORRHOEAE RNA,TMA,UROGENTIAL
C. trachomatis RNA, TMA: NOT DETECTED
N. gonorrhoeae RNA, TMA: NOT DETECTED

## 2022-07-24 LAB — HIV ANTIBODY (ROUTINE TESTING W REFLEX): HIV 1&2 Ab, 4th Generation: NONREACTIVE

## 2022-07-25 NOTE — Telephone Encounter (Signed)
There is a future order in epic for another urine- ordered on 3/8 by SG. Pt was called and she is aware to stop by the lab and leave a urine tomorrow (as I called her at 415 pm today)   Aware if you has any issues, to have the call clinical upstairs  JHB

## 2022-07-28 ENCOUNTER — Other Ambulatory Visit: Payer: Self-pay | Admitting: Nurse Practitioner

## 2022-07-28 DIAGNOSIS — R634 Abnormal weight loss: Secondary | ICD-10-CM

## 2022-07-28 NOTE — Progress Notes (Addendum)
Please call patient and let her know that I have ordered CT of chest, abdomen, and pelvis as her sed rate was still slightly high upon recheck. Please verify when her last period was and if there is any possibility she could be pregnant we will need to do a pregnancy test before the test. If she is still having periods and has had sex since her last menstrual cycle without use of contraception she should be asked to come in for a urine pregnancy test within the same week her CT scan is scheduled.   I also see where the repeat STI testing was not completed. I am not sure why this was not done as she was able to give urine at her last OV, but if she would still like this test to be completed she needs to come back to the lab.

## 2022-07-29 ENCOUNTER — Other Ambulatory Visit (HOSPITAL_COMMUNITY)
Admission: RE | Admit: 2022-07-29 | Discharge: 2022-07-29 | Disposition: A | Discharge status: Home / Self Care | Payer: No Typology Code available for payment source | Source: Ambulatory Visit | Attending: Nurse Practitioner | Admitting: Nurse Practitioner

## 2022-07-29 ENCOUNTER — Other Ambulatory Visit: Payer: No Typology Code available for payment source

## 2022-07-29 DIAGNOSIS — A599 Trichomoniasis, unspecified: Secondary | ICD-10-CM | POA: Insufficient documentation

## 2022-08-01 NOTE — Progress Notes (Signed)
Made pt aware of CT ordered for her, pt came on Friday to do the rest for her labs. Pt stated she has schedule for her CT test

## 2022-08-02 LAB — URINE CYTOLOGY ANCILLARY ONLY
Chlamydia: NEGATIVE
Comment: NEGATIVE
Comment: NEGATIVE
Comment: NORMAL
Neisseria Gonorrhea: NEGATIVE
Trichomonas: POSITIVE — AB

## 2022-08-17 ENCOUNTER — Ambulatory Visit
Admission: RE | Admit: 2022-08-17 | Discharge: 2022-08-17 | Disposition: A | Discharge status: Home / Self Care | Payer: No Typology Code available for payment source | Source: Ambulatory Visit | Attending: Hematology and Oncology

## 2022-08-17 DIAGNOSIS — C50311 Malignant neoplasm of lower-inner quadrant of right female breast: Secondary | ICD-10-CM

## 2022-08-25 ENCOUNTER — Other Ambulatory Visit: Payer: Self-pay

## 2022-08-25 ENCOUNTER — Encounter: Payer: Self-pay | Admitting: Obstetrics and Gynecology

## 2022-08-25 ENCOUNTER — Other Ambulatory Visit (HOSPITAL_COMMUNITY)
Admission: RE | Admit: 2022-08-25 | Discharge: 2022-08-25 | Disposition: A | Discharge status: Home / Self Care | Payer: No Typology Code available for payment source | Source: Ambulatory Visit | Attending: Obstetrics and Gynecology | Admitting: Obstetrics and Gynecology

## 2022-08-25 ENCOUNTER — Ambulatory Visit: Payer: No Typology Code available for payment source | Admitting: Obstetrics and Gynecology

## 2022-08-25 ENCOUNTER — Encounter: Payer: Self-pay | Admitting: Nurse Practitioner

## 2022-08-25 VITALS — BP 151/94 | HR 85 | Wt 211.9 lb

## 2022-08-25 DIAGNOSIS — Z01419 Encounter for gynecological examination (general) (routine) without abnormal findings: Secondary | ICD-10-CM | POA: Diagnosis present

## 2022-08-25 DIAGNOSIS — Z124 Encounter for screening for malignant neoplasm of cervix: Secondary | ICD-10-CM | POA: Diagnosis present

## 2022-08-25 NOTE — Progress Notes (Signed)
ANNUAL EXAM Patient name: RANASIA MASSARELLI MRN [id number]  Date of birth: May 26, 1975 Chief Complaint:   Gynecologic Exam  History of Present Illness:   Nel MARICIA MESMER is a 47 y.o. G2P2000 being seen today for a routine annual exam.  Current complaints: annual  Menstrual concerns? No   Breast or nipple changes? No  Contraception use? No  Sexually active? No   Patient's last menstrual period was 08/13/2022.   Last pap     Component Value Date/Time   DIAGPAP ATYPICAL GLANDULAR CELLS. (A) 12/31/2018 0000   HPVHIGH Negative 08/25/2022 1210   ADEQPAP  08/25/2022 1210    Satisfactory for evaluation; transformation zone component ABSENT.   ADEQPAP (A) 12/31/2018 0000    Satisfactory for evaluation  endocervical/transformation zone component PRESENT.    Last mammogram: 08/2022 BIRADS 2 (hx of breast cancer)      07/22/2022    4:17 PM 05/05/2022   10:00 AM 02/03/2022    9:20 AM 10/07/2021    9:51 AM 03/28/2018    3:43 PM  Depression screen PHQ 2/9  Decreased Interest 0 0 0 0 0  Down, Depressed, Hopeless 0 0 0 0 0  PHQ - 2 Score 0 0 0 0 0  Altered sleeping 0 0 2  1  Tired, decreased energy 0 0 2  2  Change in appetite 0 0 0  1  Feeling bad or failure about yourself  0 0 0  0  Trouble concentrating 0 0 1  0  Moving slowly or fidgety/restless 0 0 0  0  Suicidal thoughts 0 0 0  0  PHQ-9 Score 0 0 5  4  Difficult doing work/chores Not difficult at all Not difficult at all Somewhat difficult          03/28/2018    3:43 PM  GAD 7 : Generalized Anxiety Score  Nervous, Anxious, on Edge 0  Control/stop worrying 0  Worry too much - different things 1  Trouble relaxing 1  Restless 1  Easily annoyed or irritable 0  Afraid - awful might happen 0  Total GAD 7 Score 3     Review of Systems:   Pertinent items are noted in HPI Denies any headaches, blurred vision, fatigue, shortness of breath, chest pain, abdominal pain, abnormal vaginal discharge/itching/odor/irritation,  problems with periods, bowel movements, urination, or intercourse unless otherwise stated above. Pertinent History Reviewed:  Reviewed past medical,surgical, social and family history.  Reviewed problem list, medications and allergies. Physical Assessment:   Vitals:   08/25/22 1130 08/25/22 1132  BP: (!) 141/104 (!) 151/94  Pulse: 87 85  Weight: 211 lb 14.4 oz (96.1 kg)   Body mass index is 35.26 kg/m.        Physical Examination:   General appearance - well appearing, and in no distress  Mental status - alert, oriented to person, place, and time  Psych:  She has a normal mood and affect  Skin - warm and dry, normal color, no suspicious lesions noted  Chest - effort normal, all lung fields clear to auscultation bilaterally  Heart - normal rate and regular rhythm  Breasts - breasts appear normal, no suspicious masses, no skin or nipple changes or  axillary nodes  Abdomen - soft, nontender, nondistended, no masses or organomegaly  Pelvic -  VULVA: normal appearing vulva with no masses, tenderness or lesions   VAGINA: normal appearing vagina with normal color and discharge, no lesions   CERVIX: normal appearing cervix without discharge  or lesions, no CMT  Thin prep pap is done with HR HPV cotesting  UTERUS: uterus is felt to be normal size, shape, consistency and nontender   ADNEXA: No adnexal masses or tenderness noted.  Extremities:  No swelling or varicosities noted  Chaperone present for exam  No results found for this or any previous visit (from the past 24 hour(s)).    Assessment & Plan:  - Cervical cancer screening: Discussed guidelines. Pap with HPV collected - noted to have previous pap with AGC > colpo w/ ECC and EMB benign 2020 - STD Testing: accepts - Birth Control: Discussed options and their risks, benefits and common side effects; discussed VTE with estrogen containing options. Desires:  none - Breast Health: hx of breast cancer, surveillance mammo UTD - F/U 12  months and prn   Follow-up: Return for Annual GYN.  Lorriane Shirehristana Latese Dufault, MD 08/25/2022 11:44 AM

## 2022-08-26 ENCOUNTER — Other Ambulatory Visit: Payer: No Typology Code available for payment source

## 2022-08-29 LAB — CYTOLOGY - PAP
Adequacy: ABSENT
Comment: NEGATIVE
Diagnosis: NEGATIVE
High risk HPV: NEGATIVE

## 2022-08-30 LAB — CERVICOVAGINAL ANCILLARY ONLY
Chlamydia: NEGATIVE
Comment: NEGATIVE
Comment: NEGATIVE
Comment: NORMAL
Neisseria Gonorrhea: NEGATIVE
Trichomonas: NEGATIVE

## 2022-09-01 ENCOUNTER — Other Ambulatory Visit: Payer: Self-pay | Admitting: Hematology and Oncology

## 2022-09-01 ENCOUNTER — Ambulatory Visit: Payer: No Typology Code available for payment source | Admitting: Nurse Practitioner

## 2022-09-01 ENCOUNTER — Other Ambulatory Visit: Payer: Self-pay | Admitting: Nurse Practitioner

## 2022-09-01 DIAGNOSIS — G43009 Migraine without aura, not intractable, without status migrainosus: Secondary | ICD-10-CM

## 2022-09-08 ENCOUNTER — Telehealth: Payer: No Typology Code available for payment source | Admitting: Nurse Practitioner

## 2022-09-08 DIAGNOSIS — R030 Elevated blood-pressure reading, without diagnosis of hypertension: Secondary | ICD-10-CM | POA: Diagnosis not present

## 2022-09-08 DIAGNOSIS — G43001 Migraine without aura, not intractable, with status migrainosus: Secondary | ICD-10-CM

## 2022-09-08 DIAGNOSIS — R634 Abnormal weight loss: Secondary | ICD-10-CM | POA: Diagnosis not present

## 2022-09-08 MED ORDER — RIZATRIPTAN BENZOATE 5 MG PO TABS: ORAL_TABLET | ORAL | 3 refills | Status: DC

## 2022-09-08 NOTE — Progress Notes (Signed)
   Established Patient Office Visit  An audio/visual tele-health visit was completed today for this patient. I connected with  Susan Wilson on 09/08/22 utilizing audio/visual technology and verified that I am speaking with the correct person using two identifiers. The patient was located at their home, and I was located at the office of [facility] at [facility] during the encounter. I discussed the limitations of evaluation and management by telemedicine. The patient expressed understanding and agreed to proceed.     Subjective   Patient ID: Susan Wilson, female    DOB: [birth year]  Age: 47 y.o. MRN: [id number]  Chief Complaint  Patient presents with   Medical Management of Chronic Issues    Follow up, no new concern    Migraine   Migraine: Continues on Maxalt as needed for abortive therapy.  Reports over the last week or so she has had more migraines which she attributes to possible weather changes.  Requesting refill of her Maxalt.  Unintentional weight loss: Has not weighed her self today, however last weight from a week or so ago was 3 pounds up from previous in office weight.  Elevated blood pressure: Has had elevated blood pressure on the last few office visits.  Reports that she does monitor at home and usually runs 120s/80s.    Review of Systems  Eyes:  Negative for blurred vision.  Respiratory:  Negative for shortness of breath.   Cardiovascular:  Negative for chest pain.  Neurological:  Negative for dizziness.      Objective:     LMP 08/13/2022  BP Readings from Last 3 Encounters:  08/25/22 (!) 151/94  07/22/22 (!) 140/82  05/05/22 (!) 142/88   Wt Readings from Last 3 Encounters:  08/25/22 211 lb 14.4 oz (96.1 kg)  07/22/22 208 lb 6 oz (94.5 kg)  05/05/22 205 lb 8 oz (93.2 kg)      Physical Exam Comprehensive physical exam not completed today as office visit was conducted remotely.  Patient looks well over video.  Patient was alert and  oriented, and appeared to have appropriate judgment.   No results found for any visits on 09/08/22.    The 10-year ASCVD risk score (Arnett DK, et al., 2019) is: 3.2%    Assessment & Plan:   Problem List Items Addressed This Visit       Cardiovascular and Mediastinum   Migraine    Chronic, intermittent Maxalt  PRN abortive therapy sent to patient's pharmacy      Relevant Medications   rizatriptan (MAXALT) 5 MG tablet     Other   Elevated blood pressure reading    Chronic Patient reports much better control blood pressure when checking it at home (120s/80s) For now continue to monitor, consider antihypertensive therapy in the future if blood pressure starts to elevate.      Unintentional weight loss - Primary    Weight has been stable for the last 5 months with some increase in weight.  Last sed rate slightly elevated, however per shared decision making patient would like to hold off on CT scan.  Consider rechecking sed rate at next in-person office visit.       Return in about 1 month (around 10/08/2022) for CPE with Maralyn Sago.    Elenore Paddy, NP

## 2022-09-08 NOTE — Assessment & Plan Note (Signed)
Weight has been stable for the last 5 months with some increase in weight.  Last sed rate slightly elevated, however per shared decision making patient would like to hold off on CT scan.  Consider rechecking sed rate at next in-person office visit.

## 2022-09-08 NOTE — Assessment & Plan Note (Signed)
Chronic, intermittent Maxalt  PRN abortive therapy sent to patient's pharmacy

## 2022-09-08 NOTE — Assessment & Plan Note (Signed)
Chronic Patient reports much better control blood pressure when checking it at home (120s/80s) For now continue to monitor, consider antihypertensive therapy in the future if blood pressure starts to elevate.

## 2022-09-09 ENCOUNTER — Other Ambulatory Visit: Payer: Self-pay | Admitting: Hematology and Oncology

## 2022-09-09 DIAGNOSIS — C50311 Malignant neoplasm of lower-inner quadrant of right female breast: Secondary | ICD-10-CM

## 2022-11-03 ENCOUNTER — Encounter: Payer: No Typology Code available for payment source | Admitting: Nurse Practitioner

## 2022-12-06 ENCOUNTER — Telehealth: Payer: Self-pay | Admitting: Hematology and Oncology

## 2022-12-06 NOTE — Telephone Encounter (Signed)
Patient is aware of rescheduled appointment times/dates. Patient also stated they will be in surgery soon so they would call back if needing to reschedule for a further date due to recovery

## 2022-12-07 ENCOUNTER — Other Ambulatory Visit: Payer: Self-pay | Admitting: Nurse Practitioner

## 2022-12-07 ENCOUNTER — Other Ambulatory Visit: Payer: Self-pay | Admitting: Hematology and Oncology

## 2022-12-07 DIAGNOSIS — G43001 Migraine without aura, not intractable, with status migrainosus: Secondary | ICD-10-CM

## 2022-12-10 ENCOUNTER — Encounter: Payer: Self-pay | Admitting: Hematology and Oncology

## 2022-12-12 ENCOUNTER — Other Ambulatory Visit: Payer: Self-pay | Admitting: *Deleted

## 2022-12-12 DIAGNOSIS — C50311 Malignant neoplasm of lower-inner quadrant of right female breast: Secondary | ICD-10-CM

## 2022-12-12 MED ORDER — MELOXICAM 15 MG PO TABS
ORAL_TABLET | ORAL | 1 refills | Status: AC
Start: 2022-12-12 — End: ?

## 2022-12-15 ENCOUNTER — Ambulatory Visit: Payer: No Typology Code available for payment source | Admitting: Nurse Practitioner

## 2022-12-15 VITALS — BP 148/98 | HR 90 | Temp 98.9°F | Ht 65.0 in | Wt 210.2 lb

## 2022-12-15 DIAGNOSIS — R7303 Prediabetes: Secondary | ICD-10-CM | POA: Insufficient documentation

## 2022-12-15 DIAGNOSIS — R030 Elevated blood-pressure reading, without diagnosis of hypertension: Secondary | ICD-10-CM

## 2022-12-15 DIAGNOSIS — Z136 Encounter for screening for cardiovascular disorders: Secondary | ICD-10-CM

## 2022-12-15 DIAGNOSIS — Z0001 Encounter for general adult medical examination with abnormal findings: Secondary | ICD-10-CM | POA: Diagnosis not present

## 2022-12-15 DIAGNOSIS — R700 Elevated erythrocyte sedimentation rate: Secondary | ICD-10-CM | POA: Diagnosis not present

## 2022-12-15 DIAGNOSIS — Z131 Encounter for screening for diabetes mellitus: Secondary | ICD-10-CM

## 2022-12-15 NOTE — Assessment & Plan Note (Signed)
Counseled on healthy lifestyle recommendations, handout provided

## 2022-12-15 NOTE — Assessment & Plan Note (Signed)
Labs ordered, further recommendations may be made based upon these results. 

## 2022-12-15 NOTE — Assessment & Plan Note (Signed)
We discussed the definition of hypertension and that at this point I would recommend low-dose antihypertensive to prevent long-term complications associated with high blood pressure.  Patient is still hesitant to start medication and wants to hold off for now.  She would like to follow-up after her upcoming breast augmentation surgery to have this rechecked.  Patient counseled to notify me or another healthcare provider if she starts to experience frequent headaches, blurry vision, chest pain, shortness of breath.

## 2022-12-15 NOTE — Progress Notes (Signed)
Complete physical exam  Patient: Susan Wilson   DOB: 03/23/1976   47 y.o. Female  MRN: [id number]  Subjective:    Chief Complaint  Patient presents with   Annual Exam    No other concerns    Susan Wilson Pert is a 47 y.o. female who presents today for a complete physical exam. She reports consuming a general diet.  Yoga classes, intermittently  She generally feels fairly well. She reports sleeping fairly well. She does have additional problems to discuss today.   Weight loss/slightly high sed rate: weight has stabilized. Has slightly elevated sed rate of 52 last time it was checked. Patient has history of breast cancer. Follows with oncology on regular basis. Last mammogram in 08/2022 which was negative for signs of reoccurrence.   Elevated BP: Has had elevated readings in the past.  Reports blood pressures at home are better controlled, reports today at home reading was 130/98.  No chest pain, headache, blurry vision, or shortness of breath.   Most recent fall risk assessment:    12/15/2022    4:23 PM  Fall Risk   Falls in the past year? 0  Number falls in past yr: 0  Injury with Fall? 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   Personal history of chemotherapy 2017   Personal history of radiation therapy 2017   Past Surgical History:  Procedure Laterality Date   BREAST LUMPECTOMY Right 2017   BREAST RECONSTRUCTION Right 06/23/2015    Procedure: ONCOPLASTIC RIGHT BREAST REDUCTION;  Surgeon: Glenna Fellows, MD;  Location: [facility];  Service: Plastics;  Laterality: Right;   BREAST REDUCTION SURGERY Left 06/23/2015   Procedure: MAMMARY REDUCTION  (BREAST) LEFT BREAST FOR SYMETRY (BILATERAL BREAST REDUCTION);  Surgeon: Glenna Fellows, MD;  Location: [facility];  Service: Plastics;  Laterality: Left;   BREAST REDUCTION SURGERY Left 01/15/2016   Procedure: MAMMARY REDUCTION  (BREAST) LEFT;  Surgeon: Glenna Fellows, MD;  Location: [facility];  Service: Plastics;  Laterality: Left;   MASTOPEXY Left 01/15/2016   Procedure: POSSIBLE LEFT MASTOPEXY;  Surgeon: Glenna Fellows, MD;  Location: [facility];  Service: Plastics;  Laterality: Left;   PORT-A-CATH REMOVAL Right 01/15/2016   Procedure: REMOVAL OF RIGHT PORT-A-CATH;  Surgeon: Glenna Fellows, MD;  Location: [facility];  Service: Plastics;  Laterality: Right;   PORTACATH PLACEMENT Right 12/16/2014   Procedure: INSERTION PORT-A-CATH WITH ULTRASOUND;  Surgeon: Emelia Loron, MD;  Location: [facility];  Service: General;  Laterality: Right;   RADIOACTIVE SEED GUIDED PARTIAL MASTECTOMY WITH AXILLARY SENTINEL LYMPH NODE BIOPSY Right 06/15/2015   Procedure: RADIOACTIVE SEED GUIDED PARTIAL MASTECTOMY WITH AXILLARY SENTINEL LYMPH NODE BIOPSY;  Surgeon: Emelia Loron, MD;  Location: [facility];  Service: General;  Laterality: Right;   REDUCTION MAMMAPLASTY Bilateral 2017   Social History   Socioeconomic History   Marital status: Legally Separated    Spouse name: Not on file   Number of children: 2   Years of education: Not on file   Highest education level: Not on file  Occupational History   Not on file  Tobacco Use   Smoking status: Never   Smokeless tobacco: Never  Substance and Sexual Activity   Alcohol use: Yes    Comment: rarely   Drug use: No   Sexual activity: Yes    Birth  control/protection: Condom  Other Topics Concern   Not on file  Social History Narrative   Lives with husband.  They have two children.   She works at [employer] and retirement.   Highest level of education:  Associates degree   Social Determinants of Health   Financial Resource Strain: Not on file  Food Insecurity: Not on file  Transportation Needs: Not on file  Physical Activity: Not on file  Stress: Not on file  Social Connections: Not on file  Intimate Partner Violence: Not on file   Family History  Problem Relation Age of Onset   Mesothelioma Maternal Grandfather        asbestos exposure, died in his 38s   Heart defect Sister 0   Breast cancer Paternal Grandmother    Diabetes Paternal Grandmother    Cancer Paternal Grandfather        NOS   Colon cancer Other 64       MGMs brother with colon cancer   Cancer Other        several of MGF'Wilson sisters with cancer NOS   Liver cancer Other        MGF'Wilson brother   Heart disease Paternal Aunt    Heart disease Paternal Uncle    No Known Allergies    Patient Care Team: Elenore Paddy, NP as PCP - General (Nurse Practitioner) Emelia Loron, MD as Consulting Physician (General Surgery) Glendale Chard, DO as Consulting Physician (Neurology) Glenna Fellows, MD as Consulting Physician (Plastic Surgery) Rachel Moulds, MD as Consulting Physician (Hematology and Oncology)   Outpatient Medications Prior to Visit  Medication Sig   b complex vitamins tablet Take 1 tablet by mouth daily.   ferrous sulfate 325 (65 FE) MG tablet Take 325 mg by mouth daily with breakfast.   gabapentin (NEURONTIN) 300 MG capsule TAKE 1 CAPSULE BY MOUTH EVERYDAY AT BEDTIME   meloxicam (MOBIC) 15 MG tablet TAKE 1 TABLET BY MOUTH EVERY DAY   nortriptyline (PAMELOR) 10 MG capsule TAKE 1 CAPSULE BY MOUTH EVERYDAY AT BEDTIME   ondansetron (ZOFRAN) 4 MG tablet TAKE 1 TABLET BY MOUTH EVERY 8 HOURS AS NEEDED FOR NAUSEA AND VOMITING    rizatriptan (MAXALT) 5 MG tablet TAKE 1 TABLET BY MOUTH AS NEEDED FOR MIGRAINE. MAY REPEAT IN 2 HOURS IF NEEDED   vitamin E (VITAMIN E) 400 UNIT capsule Take 1 capsule (400 Units total) by mouth daily.   No facility-administered medications prior to visit.    Review of Systems  Constitutional:  Negative for chills, fever, malaise/fatigue and weight loss.  Eyes:  Negative for blurred vision and double vision.  Respiratory:  Negative for cough, shortness of breath and wheezing.   Cardiovascular:  Negative for chest pain and palpitations.  Gastrointestinal:  Negative for abdominal pain and blood in stool.  Genitourinary:  Negative for frequency.  Skin:  Negative for itching and rash.  Neurological:  Negative for seizures and loss of consciousness.  Psychiatric/Behavioral:  Negative for depression and suicidal ideas. The patient is not nervous/anxious.           Objective:     BP (!) 148/98 (BP Location: Left Arm, Patient Position: Sitting, Cuff Size: Normal)   Pulse 90   Temp 98.9 F (37.2 C) (Oral)   Ht 5\' 5"  (1.651 m)   Wt 210 lb 3.2 oz (95.3 kg)   LMP 11/13/2022 (Exact Date)   SpO2 100%   BMI 34.98 kg/m  BP Readings from Last 3 Encounters:  12/15/22 (!) 148/98  08/25/22 (!) 151/94  07/22/22 (!) 140/82   Wt Readings from Last 3 Encounters:  12/15/22 210 lb 3.2 oz (95.3 kg)  08/25/22 211 lb 14.4 oz (96.1 kg)  07/22/22 208 lb 6 oz (94.5 kg)      Physical Exam Vitals reviewed. Exam conducted with a chaperone present.  Constitutional:      Appearance: Normal appearance.  HENT:     Head: Normocephalic and atraumatic.     Right Ear: Tympanic membrane, ear canal and external ear normal.     Left Ear: Tympanic membrane, ear canal and external ear normal.  Eyes:     General:        Right eye: No discharge.        Left eye: No discharge.     Extraocular Movements: Extraocular movements intact.     Conjunctiva/sclera: Conjunctivae normal.     Pupils: Pupils are equal,  round, and reactive to light.  Neck:     Vascular: No carotid bruit.  Cardiovascular:     Rate and Rhythm: Normal rate and regular rhythm.     Pulses: Normal pulses.     Heart sounds: Normal heart sounds. No murmur heard. Pulmonary:     Effort: Pulmonary effort is normal.     Breath sounds: Normal breath sounds.  Chest:  Breasts:    Breasts are symmetrical.     Right: Normal.     Left: Normal.    Abdominal:     General: Abdomen is flat. Bowel sounds are normal. There is no distension.     Palpations: Abdomen is soft. There is no mass.     Tenderness: There is no abdominal tenderness.  Musculoskeletal:        General: No tenderness.     Cervical back: Neck supple. No muscular tenderness.     Right lower leg: No edema.     Left lower leg: No edema.  Lymphadenopathy:     Cervical: No cervical adenopathy.     Upper Body:     Right upper body: No supraclavicular adenopathy.     Left upper body: No supraclavicular adenopathy.  Skin:    General: Skin is warm and dry.  Neurological:     General: No focal deficit present.     Mental Status: She is alert and oriented to person, place, and time.     Motor: No weakness.     Gait: Gait normal.  Psychiatric:        Mood and Affect: Mood normal.        Behavior: Behavior normal.        Judgment: Judgment normal.      No results found for any visits on 12/15/22.     Assessment & Plan:    Routine Health Maintenance and Physical Exam   There is no immunization history  on file for this patient.  Health Maintenance  Topic Date Due   DTaP/Tdap/Td (1 - Tdap) Never done   INFLUENZA VACCINE  12/15/2022   Fecal DNA (Cologuard)  11/17/2024   PAP SMEAR-Modifier  08/24/2025   Hepatitis C Screening  Completed   HIV Screening  Completed   HPV VACCINES  Aged Out   COVID-19 Vaccine  Discontinued    Discussed health benefits of physical activity, and encouraged her to engage in regular exercise appropriate for her age and  condition.  Problem List Items Addressed This Visit       Other   Screening for cardiovascular condition    Labs ordered, further recommendations may be made based upon these results       Relevant Orders   CBC   Comprehensive metabolic panel   Hemoglobin A1c   Lipid panel   TSH   Sedimentation rate   Diabetes mellitus screening    Labs ordered, further recommendations may be made based upon these results       Relevant Orders   Hemoglobin A1c   Encounter for general adult medical examination with abnormal findings - Primary    Counseled on healthy lifestyle recommendations, handout provided      Relevant Orders   CBC   Comprehensive metabolic panel   Hemoglobin A1c   Lipid panel   TSH   Sedimentation rate   Elevated blood pressure reading    We discussed the definition of hypertension and that at this point I would recommend low-dose antihypertensive to prevent long-term complications associated with high blood pressure.  Patient is still hesitant to start medication and wants to hold off for now.  She would like to follow-up after her upcoming breast augmentation surgery to have this rechecked.  Patient counseled to notify me or another healthcare provider if she starts to experience frequent headaches, blurry vision, chest pain, shortness of breath.      Elevated sed rate    Weight has now stabilized.  Per shared decision making I will recheck sed rate at next lab draw.  If still elevated will forward results to patient'Wilson oncologist to determine whether or not patient should undergo imaging for further evaluation.  Patient reports understanding.      Relevant Orders   CBC   Comprehensive metabolic panel   Hemoglobin A1c   Lipid panel   TSH   Sedimentation rate   Return in about 3 months (around 03/17/2023) for F/U with Maralyn Sago; anytime after upcoming surgery.     Elenore Paddy, NP

## 2022-12-15 NOTE — Assessment & Plan Note (Signed)
Weight has now stabilized.  Per shared decision making I will recheck sed rate at next lab draw.  If still elevated will forward results to patient's oncologist to determine whether or not patient should undergo imaging for further evaluation.  Patient reports understanding.

## 2022-12-21 ENCOUNTER — Ambulatory Visit: Payer: No Typology Code available for payment source | Admitting: Hematology and Oncology

## 2022-12-26 ENCOUNTER — Other Ambulatory Visit (INDEPENDENT_AMBULATORY_CARE_PROVIDER_SITE_OTHER): Payer: No Typology Code available for payment source

## 2022-12-26 DIAGNOSIS — Z136 Encounter for screening for cardiovascular disorders: Secondary | ICD-10-CM | POA: Diagnosis not present

## 2022-12-26 DIAGNOSIS — R700 Elevated erythrocyte sedimentation rate: Secondary | ICD-10-CM | POA: Diagnosis not present

## 2022-12-26 DIAGNOSIS — Z13220 Encounter for screening for lipoid disorders: Secondary | ICD-10-CM | POA: Diagnosis not present

## 2022-12-26 DIAGNOSIS — Z131 Encounter for screening for diabetes mellitus: Secondary | ICD-10-CM

## 2022-12-26 DIAGNOSIS — Z0001 Encounter for general adult medical examination with abnormal findings: Secondary | ICD-10-CM

## 2022-12-26 LAB — LIPID PANEL
Cholesterol: 142 mg/dL (ref 0–200)
HDL: 44 mg/dL (ref >39.00)
LDL Cholesterol: 84 mg/dL (ref 0–99)
NonHDL: 97.65
Total CHOL/HDL Ratio: 3
Triglycerides: 66 mg/dL (ref 0.0–149.0)
VLDL: 13.2 mg/dL (ref 0.0–40.0)

## 2022-12-26 LAB — CBC
HCT: 37.9 % (ref 36.0–46.0)
Hemoglobin: 11.8 g/dL — ABNORMAL LOW (ref 12.0–15.0)
MCHC: 31 g/dL (ref 30.0–36.0)
MCV: 80.7 fl (ref 78.0–100.0)
Platelets: 294 10*3/uL (ref 150.0–400.0)
RBC: 4.7 Mil/uL (ref 3.87–5.11)
RDW: 15.2 % (ref 11.5–15.5)
WBC: 3.9 10*3/uL — ABNORMAL LOW (ref 4.0–10.5)

## 2022-12-26 LAB — COMPREHENSIVE METABOLIC PANEL
ALT: 16 U/L (ref 0–35)
AST: 17 U/L (ref 0–37)
Albumin: 4.2 g/dL (ref 3.5–5.2)
Alkaline Phosphatase: 55 U/L (ref 39–117)
BUN: 15 mg/dL (ref 6–23)
CO2: 26 mEq/L (ref 19–32)
Calcium: 9.2 mg/dL (ref 8.4–10.5)
Chloride: 101 mEq/L (ref 96–112)
Creatinine, Ser: 0.71 mg/dL (ref 0.40–1.20)
GFR: 101.07 mL/min (ref >60.00)
Glucose, Bld: 100 mg/dL — ABNORMAL HIGH (ref 70–99)
Potassium: 3.4 mEq/L — ABNORMAL LOW (ref 3.5–5.1)
Sodium: 135 mEq/L (ref 135–145)
Total Bilirubin: 0.7 mg/dL (ref 0.2–1.2)
Total Protein: 7.5 g/dL (ref 6.0–8.3)

## 2022-12-26 LAB — SEDIMENTATION RATE: Sed Rate: 45 mm/hr — ABNORMAL HIGH (ref 0–20)

## 2022-12-26 LAB — TSH: TSH: 0.83 u[IU]/mL (ref 0.35–5.50)

## 2022-12-26 LAB — HEMOGLOBIN A1C: Hgb A1c MFr Bld: 5.4 % (ref 4.6–6.5)

## 2022-12-26 NOTE — H&P (Signed)
Subjective:      Follow-up   Presents for preoperative examination prior to revision left breast reduction. History oncoplastic breast reconstruction with repeat left breast reduction for significant asymmetry post RT. Wt stable since last visit here 2022, down 20 lbs since time of left revision surgery.    Presented with palpable mass in her right breast MMG and US revealed breast density was category B,  hyperdense mass in the right LIQ associated with skin thickening. There was also a 5 mm nodule posteriorly at the 8:30 o'clock position in the right breast. There were several hyperdense nodules in the right axilla. By ultrasound this was lobulated and appear to involve the skin, measuring up to 4.1 cm.  The right axilla showed 3 hypervascular lymph nodes with prominent cortical thickening. Right breast mass and one axillary node with IDC ER/PR - HER-2 equivocal. LN negative though felt to be discordant, repeat biopsy negative. Underwent neoadjuvant chemotherapy, completed end 04/2015. Underwent lumpectomy with SLN with final pathology residual 0.9 cm IDC, margins clear and 0/4 SLN negative.  Completed radiation 6.5.17.   Genetics negative   MMG 08/2022 normal   Prior 43 DD Right breast reduction 811 g, left breast reduction 858 g, repeat left reduction left breast reduction 632 g . Weight of prior right lumpectomy not recorded.   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 Lives with spouse, 2 daughters.   Review of Systems     Objective:  Physical Exam   CV: normal heart sounds normal rate PULM: clear to auscultation bilateral   Lymph: no  axillary adenopathy   Chest :   Breast no masses,induration scar right vertical scar Left lower pole bottoming out Pseudoptosis bilateral SN to nipple R 24 L 24 cm,  BW R 22 L 25 cm Nipple to IMF R 12 L 12 cm to scar, 15 cm to fold     Assessment:    Right breast cancer, neoadjuvant chemotherapy S/p  lumpectomy, SLN S/p oncoplastic reduction S/p revision left reduction, removal port Post operative asymmetry breasts   Plan:    Asymmetry breasts. Review of her old pictures suggests continued post RT right breast contraction, stable size left breast since 2022 visit. We agree nipple position symmetric though areolae some asymmetry. Patient does not desire revision of this. Plan revision left breast with excision lower pole tissue at IMF alone. Plan OP surgery, possible drain placement. Reviewed radiated breast will not develop same ptosis as left breast due to fibrosis. Reviewed post op limitations, risk NAC necrosis, recurrent asymmetry over time.    Additional risks including but not limited to bleeding, infection, seroma, hematoma, damage to adjacent structures, need for additional surgeries, unacceptable cosmetic result, blood clots in legs or lungs reviewed.   Rx for Norco given. Drain teaching completed.  Glenna Fellows, MD [facility] Plastic & Reconstructive Surgery  Office/ physician access line after hours [phone]

## 2022-12-29 ENCOUNTER — Encounter (HOSPITAL_BASED_OUTPATIENT_CLINIC_OR_DEPARTMENT_OTHER): Payer: Self-pay | Admitting: Plastic Surgery

## 2022-12-29 ENCOUNTER — Other Ambulatory Visit: Payer: Self-pay

## 2023-01-03 ENCOUNTER — Other Ambulatory Visit: Payer: Self-pay

## 2023-01-03 ENCOUNTER — Ambulatory Visit (HOSPITAL_BASED_OUTPATIENT_CLINIC_OR_DEPARTMENT_OTHER): Payer: No Typology Code available for payment source | Admitting: Anesthesiology

## 2023-01-03 ENCOUNTER — Encounter (HOSPITAL_BASED_OUTPATIENT_CLINIC_OR_DEPARTMENT_OTHER)
Admission: RE | Disposition: A | Discharge status: Home / Self Care | Payer: Self-pay | Source: Home / Self Care | Attending: Plastic Surgery

## 2023-01-03 ENCOUNTER — Ambulatory Visit (HOSPITAL_BASED_OUTPATIENT_CLINIC_OR_DEPARTMENT_OTHER)
Admission: RE | Admit: 2023-01-03 | Discharge: 2023-01-03 | Disposition: A | Discharge status: Home / Self Care | Payer: No Typology Code available for payment source | Attending: Plastic Surgery | Admitting: Plastic Surgery

## 2023-01-03 ENCOUNTER — Encounter (HOSPITAL_BASED_OUTPATIENT_CLINIC_OR_DEPARTMENT_OTHER): Payer: Self-pay | Admitting: Plastic Surgery

## 2023-01-03 DIAGNOSIS — N651 Disproportion of reconstructed breast: Secondary | ICD-10-CM

## 2023-01-03 DIAGNOSIS — Z9221 Personal history of antineoplastic chemotherapy: Secondary | ICD-10-CM | POA: Diagnosis not present

## 2023-01-03 DIAGNOSIS — Z853 Personal history of malignant neoplasm of breast: Secondary | ICD-10-CM | POA: Insufficient documentation

## 2023-01-03 DIAGNOSIS — N6489 Other specified disorders of breast: Secondary | ICD-10-CM | POA: Diagnosis present

## 2023-01-03 DIAGNOSIS — Z01818 Encounter for other preprocedural examination: Secondary | ICD-10-CM

## 2023-01-03 DIAGNOSIS — Z923 Personal history of irradiation: Secondary | ICD-10-CM | POA: Diagnosis not present

## 2023-01-03 HISTORY — PX: BREAST REDUCTION SURGERY: SHX8

## 2023-01-03 SURGERY — MAMMOPLASTY, REDUCTION
Anesthesia: General | Site: Breast | Laterality: Left

## 2023-01-03 MED ORDER — LIDOCAINE 2% (20 MG/ML) 5 ML SYRINGE
INTRAMUSCULAR | Status: AC
  Filled 2023-01-03: qty 5

## 2023-01-03 MED ORDER — ONDANSETRON HCL 4 MG/2ML IJ SOLN
INTRAMUSCULAR | Status: DC | PRN
Start: 2023-01-03 — End: 2023-01-03
  Administered 2023-01-03: 4 mg via INTRAVENOUS

## 2023-01-03 MED ORDER — PROMETHAZINE HCL 25 MG/ML IJ SOLN: 6.2500 mg | INTRAMUSCULAR | Status: DC | PRN

## 2023-01-03 MED ORDER — MIDAZOLAM HCL 2 MG/2ML IJ SOLN
INTRAMUSCULAR | Status: AC
  Filled 2023-01-03: qty 2

## 2023-01-03 MED ORDER — BUPIVACAINE HCL (PF) 0.5 % IJ SOLN
INTRAMUSCULAR | Status: AC
  Filled 2023-01-03: qty 30

## 2023-01-03 MED ORDER — PHENYLEPHRINE 80 MCG/ML (10ML) SYRINGE FOR IV PUSH (FOR BLOOD PRESSURE SUPPORT)
PREFILLED_SYRINGE | INTRAVENOUS | Status: AC
  Filled 2023-01-03: qty 10

## 2023-01-03 MED ORDER — CHLORHEXIDINE GLUCONATE CLOTH 2 % EX PADS: 6.0000 | MEDICATED_PAD | Freq: Once | CUTANEOUS | Status: DC

## 2023-01-03 MED ORDER — LACTATED RINGERS IV SOLN: INTRAVENOUS | Status: DC

## 2023-01-03 MED ORDER — SCOPOLAMINE 1 MG/3DAYS TD PT72
1.0000 | MEDICATED_PATCH | TRANSDERMAL | Status: DC
  Administered 2023-01-03: 1.5 mg via TRANSDERMAL

## 2023-01-03 MED ORDER — ACETAMINOPHEN 500 MG PO TABS
1000.0000 mg | ORAL_TABLET | ORAL | Status: AC
  Administered 2023-01-03: 1000 mg via ORAL

## 2023-01-03 MED ORDER — OXYCODONE HCL 5 MG/5ML PO SOLN: 5.0000 mg | Freq: Once | ORAL | Status: AC | PRN

## 2023-01-03 MED ORDER — OXYCODONE HCL 5 MG PO TABS
ORAL_TABLET | ORAL | Status: AC
  Filled 2023-01-03: qty 1

## 2023-01-03 MED ORDER — DEXAMETHASONE SODIUM PHOSPHATE 4 MG/ML IJ SOLN
INTRAMUSCULAR | Status: DC | PRN
  Administered 2023-01-03: 5 mg via INTRAVENOUS

## 2023-01-03 MED ORDER — 0.9 % SODIUM CHLORIDE (POUR BTL) OPTIME
TOPICAL | Status: DC | PRN
  Administered 2023-01-03: 100 mL

## 2023-01-03 MED ORDER — SUCCINYLCHOLINE CHLORIDE 200 MG/10ML IV SOSY
PREFILLED_SYRINGE | INTRAVENOUS | Status: AC
  Filled 2023-01-03: qty 10

## 2023-01-03 MED ORDER — OXYCODONE HCL 5 MG PO TABS
5.0000 mg | ORAL_TABLET | Freq: Once | ORAL | Status: AC | PRN
  Administered 2023-01-03: 5 mg via ORAL

## 2023-01-03 MED ORDER — DEXAMETHASONE SODIUM PHOSPHATE 10 MG/ML IJ SOLN
INTRAMUSCULAR | Status: AC
  Filled 2023-01-03: qty 1

## 2023-01-03 MED ORDER — ONDANSETRON HCL 4 MG/2ML IJ SOLN
INTRAMUSCULAR | Status: AC
  Filled 2023-01-03: qty 2

## 2023-01-03 MED ORDER — BUPIVACAINE HCL (PF) 0.5 % IJ SOLN
INTRAMUSCULAR | Status: DC | PRN
  Administered 2023-01-03: 20 mL

## 2023-01-03 MED ORDER — ROCURONIUM BROMIDE 10 MG/ML (PF) SYRINGE
PREFILLED_SYRINGE | INTRAVENOUS | Status: AC
  Filled 2023-01-03: qty 10

## 2023-01-03 MED ORDER — HYDROMORPHONE HCL 1 MG/ML IJ SOLN
INTRAMUSCULAR | Status: DC | PRN
Start: 2023-01-03 — End: 2023-01-03
  Administered 2023-01-03: .5 mg via INTRAVENOUS

## 2023-01-03 MED ORDER — ATROPINE SULFATE 0.4 MG/ML IV SOLN
INTRAVENOUS | Status: AC
  Filled 2023-01-03: qty 1

## 2023-01-03 MED ORDER — EPHEDRINE 5 MG/ML INJ
INTRAVENOUS | Status: AC
  Filled 2023-01-03: qty 5

## 2023-01-03 MED ORDER — LIDOCAINE HCL (CARDIAC) PF 100 MG/5ML IV SOSY
PREFILLED_SYRINGE | INTRAVENOUS | Status: DC | PRN
  Administered 2023-01-03: 100 mg via INTRAVENOUS

## 2023-01-03 MED ORDER — SCOPOLAMINE 1 MG/3DAYS TD PT72
MEDICATED_PATCH | TRANSDERMAL | Status: AC
  Filled 2023-01-03: qty 1

## 2023-01-03 MED ORDER — GABAPENTIN 300 MG PO CAPS
300.0000 mg | ORAL_CAPSULE | ORAL | Status: AC
  Administered 2023-01-03: 300 mg via ORAL

## 2023-01-03 MED ORDER — FENTANYL CITRATE (PF) 100 MCG/2ML IJ SOLN
INTRAMUSCULAR | Status: AC
  Filled 2023-01-03: qty 2

## 2023-01-03 MED ORDER — FENTANYL CITRATE (PF) 100 MCG/2ML IJ SOLN
INTRAMUSCULAR | Status: DC | PRN
  Administered 2023-01-03: 50 ug via INTRAVENOUS
  Administered 2023-01-03: 100 ug via INTRAVENOUS

## 2023-01-03 MED ORDER — HYDROMORPHONE HCL 1 MG/ML IJ SOLN
INTRAMUSCULAR | Status: AC
  Filled 2023-01-03: qty 0.5

## 2023-01-03 MED ORDER — SUGAMMADEX SODIUM 200 MG/2ML IV SOLN
INTRAVENOUS | Status: DC | PRN
  Administered 2023-01-03: 200 mg via INTRAVENOUS

## 2023-01-03 MED ORDER — CEFAZOLIN SODIUM-DEXTROSE 2-4 GM/100ML-% IV SOLN
INTRAVENOUS | Status: AC
  Filled 2023-01-03: qty 100

## 2023-01-03 MED ORDER — MIDAZOLAM HCL 2 MG/2ML IJ SOLN: 0.5000 mg | Freq: Once | INTRAMUSCULAR | Status: DC | PRN

## 2023-01-03 MED ORDER — CELECOXIB 200 MG PO CAPS
ORAL_CAPSULE | ORAL | Status: AC
  Filled 2023-01-03: qty 1

## 2023-01-03 MED ORDER — GABAPENTIN 300 MG PO CAPS
ORAL_CAPSULE | ORAL | Status: AC
  Filled 2023-01-03: qty 1

## 2023-01-03 MED ORDER — CEFAZOLIN SODIUM-DEXTROSE 2-4 GM/100ML-% IV SOLN
2.0000 g | INTRAVENOUS | Status: AC
  Administered 2023-01-03: 2 g via INTRAVENOUS

## 2023-01-03 MED ORDER — MEPERIDINE HCL 25 MG/ML IJ SOLN: 6.2500 mg | INTRAMUSCULAR | Status: DC | PRN

## 2023-01-03 MED ORDER — ROCURONIUM BROMIDE 100 MG/10ML IV SOLN
INTRAVENOUS | Status: DC | PRN
  Administered 2023-01-03: 70 mg via INTRAVENOUS

## 2023-01-03 MED ORDER — ACETAMINOPHEN 500 MG PO TABS
ORAL_TABLET | ORAL | Status: AC
  Filled 2023-01-03: qty 2

## 2023-01-03 MED ORDER — PROPOFOL 10 MG/ML IV BOLUS
INTRAVENOUS | Status: DC | PRN
  Administered 2023-01-03: 200 mg via INTRAVENOUS

## 2023-01-03 MED ORDER — CELECOXIB 200 MG PO CAPS
200.0000 mg | ORAL_CAPSULE | ORAL | Status: AC
  Administered 2023-01-03: 200 mg via ORAL

## 2023-01-03 MED ORDER — HYDROMORPHONE HCL 1 MG/ML IJ SOLN: 0.2500 mg | INTRAMUSCULAR | Status: DC | PRN

## 2023-01-03 SURGICAL SUPPLY — 50 items
ADH SKN CLS APL DERMABOND .7 (GAUZE/BANDAGES/DRESSINGS) ×1
APL PRP STRL LF DISP 70% ISPRP (MISCELLANEOUS) ×2
BINDER BREAST XLRG (GAUZE/BANDAGES/DRESSINGS) IMPLANT
BINDER BREAST XXLRG (GAUZE/BANDAGES/DRESSINGS) IMPLANT
BLADE SURG 10 STRL SS (BLADE) ×4 IMPLANT
BNDG GAUZE DERMACEA FLUFF 4 (GAUZE/BANDAGES/DRESSINGS) ×2 IMPLANT
BNDG GZE DERMACEA 4 6PLY (GAUZE/BANDAGES/DRESSINGS) ×2
CANISTER SUCT 1200ML W/VALVE (MISCELLANEOUS) ×1 IMPLANT
CHLORAPREP W/TINT 26 (MISCELLANEOUS) ×2 IMPLANT
COVER BACK TABLE 60X90IN (DRAPES) ×1 IMPLANT
COVER MAYO STAND STRL (DRAPES) ×1 IMPLANT
DERMABOND ADVANCED .7 DNX12 (GAUZE/BANDAGES/DRESSINGS) ×2 IMPLANT
DRAIN CHANNEL 15F RND FF W/TCR (WOUND CARE) IMPLANT
DRAIN CHANNEL 19F RND (DRAIN) IMPLANT
DRAIN RELI 100 BL SUC LF ST (DRAIN) ×1
DRAPE TOP ARMCOVERS (MISCELLANEOUS) ×1 IMPLANT
DRAPE U-SHAPE 76X120 STRL (DRAPES) ×1 IMPLANT
DRAPE UTILITY XL STRL (DRAPES) ×1 IMPLANT
ELECT COATED BLADE 2.86 ST (ELECTRODE) ×1 IMPLANT
ELECT REM PT RETURN 9FT ADLT (ELECTROSURGICAL) ×1
ELECTRODE REM PT RTRN 9FT ADLT (ELECTROSURGICAL) ×1 IMPLANT
EVACUATOR SILICONE 100CC (DRAIN) IMPLANT
GAUZE PAD ABD 8X10 STRL (GAUZE/BANDAGES/DRESSINGS) ×2 IMPLANT
GLOVE BIO SURGEON STRL SZ 6 (GLOVE) ×2 IMPLANT
GLOVE BIO SURGEON STRL SZ7 (GLOVE) IMPLANT
GLOVE BIOGEL PI IND STRL 7.0 (GLOVE) IMPLANT
GOWN STRL REUS W/ TWL LRG LVL3 (GOWN DISPOSABLE) ×2 IMPLANT
GOWN STRL REUS W/TWL LRG LVL3 (GOWN DISPOSABLE) ×3
MARKER SKIN DUAL TIP RULER LAB (MISCELLANEOUS) IMPLANT
NDL HYPO 25X1 1.5 SAFETY (NEEDLE) ×1 IMPLANT
NEEDLE HYPO 25X1 1.5 SAFETY (NEEDLE) ×1 IMPLANT
NS IRRIG 1000ML POUR BTL (IV SOLUTION) ×1 IMPLANT
PACK BASIN DAY SURGERY FS (CUSTOM PROCEDURE TRAY) ×1 IMPLANT
PENCIL SMOKE EVACUATOR (MISCELLANEOUS) ×1 IMPLANT
PIN SAFETY STERILE (MISCELLANEOUS) ×1 IMPLANT
SHEET MEDIUM DRAPE 40X70 STRL (DRAPES) ×2 IMPLANT
SLEEVE SCD COMPRESS KNEE MED (STOCKING) ×1 IMPLANT
SPONGE T-LAP 18X18 ~~LOC~~+RFID (SPONGE) ×3 IMPLANT
STAPLER VISISTAT 35W (STAPLE) ×1 IMPLANT
SUT ETHILON 2 0 FS 18 (SUTURE) IMPLANT
SUT MNCRL AB 4-0 PS2 18 (SUTURE) IMPLANT
SUT VIC AB 3-0 PS1 18 (SUTURE) ×3
SUT VIC AB 3-0 PS1 18XBRD (SUTURE) IMPLANT
SUT VIC AB 4-0 PS2 18 (SUTURE) IMPLANT
SYR BULB IRRIG 60ML STRL (SYRINGE) ×1 IMPLANT
SYR CONTROL 10ML LL (SYRINGE) ×1 IMPLANT
TOWEL GREEN STERILE FF (TOWEL DISPOSABLE) ×2 IMPLANT
TUBE CONNECTING 20X1/4 (TUBING) ×1 IMPLANT
UNDERPAD 30X36 HEAVY ABSORB (UNDERPADS AND DIAPERS) ×2 IMPLANT
YANKAUER SUCT BULB TIP NO VENT (SUCTIONS) ×1 IMPLANT

## 2023-01-03 NOTE — Anesthesia Postprocedure Evaluation (Signed)
Anesthesia Post Note  Patient: Shivon S Prajapati  Procedure(s) Performed: REVISION MAMMARY REDUCTION  (BREAST) (Left: Breast)     Patient location during evaluation: PACU Anesthesia Type: General Level of consciousness: awake and alert, patient cooperative and oriented Pain management: pain level controlled Vital Signs Assessment: post-procedure vital signs reviewed and stable Respiratory status: spontaneous breathing, nonlabored ventilation and respiratory function stable Cardiovascular status: blood pressure returned to baseline and stable Postop Assessment: no apparent nausea or vomiting, adequate PO intake and able to ambulate Anesthetic complications: no   No notable events documented.  Last Vitals:  Vitals:   01/03/23 1000 01/03/23 1015  BP: (!) 149/98 (!) 149/100  Pulse: 93 81  Resp: 20 16  Temp: 36.5 C 36.6 C  SpO2: 95% 93%    Last Pain:  Vitals:   01/03/23 1015  TempSrc: Temporal  PainSc: 4                  Jerah Esty,E. Stefani Baik

## 2023-01-03 NOTE — Interval H&P Note (Signed)
History and Physical Interval Note:  01/03/2023 6:59 AM  Susan Wilson  has presented today for surgery, with the diagnosis of hx breast cancer, hx therapeutic radiation, post operative asymmetry breasts.  The various methods of treatment have been discussed with the patient and family. After consideration of risks, benefits and other options for treatment, the patient has consented to  Procedure(s): REVISION MAMMARY REDUCTION  (BREAST) (Left) as a surgical intervention.  The patient's history has been reviewed, patient examined, no change in status, stable for surgery.  I have reviewed the patient's chart and labs.  Questions were answered to the patient's satisfaction.     Irean Hong Rivers Hamrick

## 2023-01-03 NOTE — Transfer of Care (Signed)
Immediate Anesthesia Transfer of Care Note  Patient: Susan Wilson  Procedure(s) Performed: REVISION MAMMARY REDUCTION  (BREAST) (Left: Breast)  Patient Location: PACU  Anesthesia Type:General  Level of Consciousness: awake, drowsy, and patient cooperative  Airway & Oxygen Therapy: Patient Spontanous Breathing and Patient connected to face mask oxygen  Post-op Assessment: Report given to RN and Post -op Vital signs reviewed and stable  Post vital signs: Reviewed and stable  Last Vitals:  Vitals Value Taken Time  BP 141/99 01/03/23 0930  Temp    Pulse 84 01/03/23 0932  Resp 16 01/03/23 0932  SpO2 98 % 01/03/23 0932  Vitals shown include unfiled device data.  Last Pain:  Vitals:   01/03/23 0633  TempSrc: Oral  PainSc: 0-No pain      Patients Stated Pain Goal: 2 (01/03/23 5366)  Complications: No notable events documented.

## 2023-01-03 NOTE — Op Note (Addendum)
Operative Note   DATE OF OPERATION: 8.20.2024  LOCATION:  [facility]  SURGICAL DIVISION: Plastic Surgery  PREOPERATIVE DIAGNOSES:  1. History right breast cancer 2. History therapeutic radiation 3. Post operative asymmetry breasts  POSTOPERATIVE DIAGNOSES:  same  PROCEDURE:  Revision left breast reduction  SURGEON: Glenna Fellows MD MBA  ASSISTANT: none  ANESTHESIA:  General.   EBL: 25 ml  COMPLICATIONS: None immediate.   INDICATIONS FOR PROCEDURE:  The patient, Susan Wilson, is a 47 y.o. female born on 06-15-1975, is here for revision left breast reduction. Patient has a history prior oncoplastic reconstruction and presents for asymmetry volume with left larger.   FINDINGS: Left reduction 377 g  DESCRIPTION OF PROCEDURE:  The patient's operative site was marked with the patient in the preoperative area. The patient was taken to the operating room. SCDs were placed and IV antibiotics were given. The patient's operative site was prepped and draped in a sterile fashion. A time out was performed and all information was confirmed to be correct. Elliptical incision designed over inframammary fold scar. This was sharply excised including resection breast tissue over lower pole. Cavity irrigated. Hemostasis obtained. Local anesthetic infiltrated. 15 Fr JP placed percutaneously and secured to skin with 2-0 nylon. Breast tailor tacked closed. Patient brought to upright sitting position and assessed for symmetry. Patient returned to supine position. Closure completed with 3-0 vicryl in dermis followed by 4-0 monocryl subcuticular skin closure. Dermabond applied followed by dry dressing and breast binder.   The patient was allowed to wake from anesthesia, extubated and taken to the recovery room in satisfactory condition.   SPECIMENS: left breast tissue  DRAINS: 15 Fr JP  Glenna Fellows, MD [facility] Plastic & Reconstructive Surgery  Office/ physician access line after  hours [phone]

## 2023-01-03 NOTE — Anesthesia Preprocedure Evaluation (Addendum)
Anesthesia Evaluation  Patient identified by MRN, date of birth, ID band Patient awake    Reviewed: Allergy & Precautions, NPO status , Patient's Chart, lab work & pertinent test results  History of Anesthesia Complications Negative for: history of anesthetic complications  Airway Mallampati: II  TM Distance: >3 FB Neck ROM: Full    Dental  (+) Dental Advisory Given   Pulmonary neg pulmonary ROS   breath sounds clear to auscultation       Cardiovascular negative cardio ROS  Rhythm:Regular Rate:Normal  '16 ECHO: EF 55% to 60%.  - Aortic valve: There was mild regurgitation.  - Mitral valve: Some cardiac beats MR looked more moderate but    poorly defined. There was mild regurgitation.     Neuro/Psych  Headaches    GI/Hepatic negative GI ROS, Neg liver ROS,,,  Endo/Other  BMI 35  Renal/GU negative Renal ROS     Musculoskeletal   Abdominal   Peds  Hematology negative hematology ROS (+)   Anesthesia Other Findings Breast cancer  Reproductive/Obstetrics                             Anesthesia Physical Anesthesia Plan  ASA: 2  Anesthesia Plan: General   Post-op Pain Management: Tylenol PO (pre-op)*   Induction: Intravenous  PONV Risk Score and Plan: 3 and Ondansetron, Dexamethasone, Scopolamine patch - Pre-op and Treatment may vary due to age or medical condition  Airway Management Planned: Oral ETT  Additional Equipment: None  Intra-op Plan:   Post-operative Plan: Extubation in OR  Informed Consent: I have reviewed the patients History and Physical, chart, labs and discussed the procedure including the risks, benefits and alternatives for the proposed anesthesia with the patient or authorized representative who has indicated his/her understanding and acceptance.     Dental advisory given  Plan Discussed with: CRNA and Surgeon  Anesthesia Plan Comments:         Anesthesia Quick Evaluation

## 2023-01-03 NOTE — Anesthesia Procedure Notes (Signed)
Procedure Name: Intubation Date/Time: 01/03/2023 7:53 AM  Performed by: Ronnette Hila, CRNAPre-anesthesia Checklist: Patient identified, Emergency Drugs available, Suction available and Patient being monitored Patient Re-evaluated:Patient Re-evaluated prior to induction Oxygen Delivery Method: Circle system utilized Preoxygenation: Pre-oxygenation with 100% oxygen Induction Type: IV induction Ventilation: Mask ventilation without difficulty Laryngoscope Size: Mac and 3 Grade View: Grade II Tube type: Oral Tube size: 7.0 mm Number of attempts: 1 Airway Equipment and Method: Stylet and Oral airway Placement Confirmation: ETT inserted through vocal cords under direct vision, positive ETCO2 and breath sounds checked- equal and bilateral Secured at: 22 cm Tube secured with: Tape Dental Injury: Teeth and Oropharynx as per pre-operative assessment

## 2023-01-03 NOTE — Discharge Instructions (Signed)
Can have tylenol after 12:30 pm if needed   Post Anesthesia Home Care Instructions  Activity: Get plenty of rest for the remainder of the day. A responsible individual must stay with you for 24 hours following the procedure.  For the next 24 hours, DO NOT: -Drive a car -Advertising copywriter -Drink alcoholic beverages -Take any medication unless instructed by your physician -Make any legal decisions or sign important papers.  Meals: Start with liquid foods such as gelatin or soup. Progress to regular foods as tolerated. Avoid greasy, spicy, heavy foods. If nausea and/or vomiting occur, drink only clear liquids until the nausea and/or vomiting subsides. Call your physician if vomiting continues.  Special Instructions/Symptoms: Your throat may feel dry or sore from the anesthesia or the breathing tube placed in your throat during surgery. If this causes discomfort, gargle with warm salt water. The discomfort should disappear within 24 hours.  If you had a scopolamine patch placed behind your ear for the management of post- operative nausea and/or vomiting:  1. The medication in the patch is effective for 72 hours, after which it should be removed.  Wrap patch in a tissue and discard in the trash. Wash hands thoroughly with soap and water. 2. You may remove the patch earlier than 72 hours if you experience unpleasant side effects which may include dry mouth, dizziness or visual disturbances. 3. Avoid touching the patch. Wash your hands with soap and water after contact with the patch.

## 2023-01-04 ENCOUNTER — Encounter (HOSPITAL_BASED_OUTPATIENT_CLINIC_OR_DEPARTMENT_OTHER): Payer: Self-pay | Admitting: Plastic Surgery

## 2023-01-04 LAB — SURGICAL PATHOLOGY

## 2023-01-05 ENCOUNTER — Other Ambulatory Visit: Payer: Self-pay | Admitting: Nurse Practitioner

## 2023-01-05 DIAGNOSIS — E876 Hypokalemia: Secondary | ICD-10-CM

## 2023-01-06 ENCOUNTER — Encounter: Payer: Self-pay | Admitting: Nurse Practitioner

## 2023-01-09 ENCOUNTER — Other Ambulatory Visit: Payer: Self-pay | Admitting: *Deleted

## 2023-01-09 ENCOUNTER — Encounter: Payer: Self-pay | Admitting: Hematology and Oncology

## 2023-01-09 DIAGNOSIS — I890 Lymphedema, not elsewhere classified: Secondary | ICD-10-CM

## 2023-01-09 DIAGNOSIS — M79601 Pain in right arm: Secondary | ICD-10-CM

## 2023-01-09 DIAGNOSIS — C50311 Malignant neoplasm of lower-inner quadrant of right female breast: Secondary | ICD-10-CM

## 2023-01-17 NOTE — Therapy (Signed)
OUTPATIENT PHYSICAL THERAPY  UPPER EXTREMITY ONCOLOGY EVALUATION  Patient Name: Susan Wilson MRN: [id number] DOB:Sep 14, 1975, 47 y.o., female Today's Date: 01/18/2023  END OF SESSION:  PT End of Session - 01/18/23 1051     Visit Number 1    Number of Visits 12    Date for PT Re-Evaluation 03/01/23    PT Start Time 1055    PT Stop Time 1151    PT Time Calculation (min) 56 min    Activity Tolerance Patient tolerated treatment well    Behavior During Therapy [facility] for tasks assessed/performed             Past Medical History:  Diagnosis Date   Anemia    Breast asymmetry 12/2015   Breast cancer (HCC) 2017   right breast   History of breast cancer 11/2014   right   History of chemotherapy    Migraines    Neuropathy due to chemotherapeutic drug (HCC)    fingers and toes   Personal history of chemotherapy 2017   Personal history of radiation therapy 2017   Past Surgical History:  Procedure Laterality Date   BREAST LUMPECTOMY Right 2017   BREAST RECONSTRUCTION Right 06/23/2015   Procedure: ONCOPLASTIC RIGHT BREAST REDUCTION;  Surgeon: Glenna Fellows, MD;  Location: [facility];  Service: Plastics;  Laterality: Right;   BREAST REDUCTION SURGERY Left 06/23/2015   Procedure: MAMMARY REDUCTION  (BREAST) LEFT BREAST FOR SYMETRY (BILATERAL BREAST REDUCTION);  Surgeon: Glenna Fellows, MD;  Location: [facility];  Service: Plastics;  Laterality: Left;   BREAST REDUCTION SURGERY Left 01/15/2016   Procedure: MAMMARY REDUCTION  (BREAST) LEFT;  Surgeon: Glenna Fellows, MD;  Location: [facility];  Service: Plastics;  Laterality: Left;   BREAST REDUCTION SURGERY Left 01/03/2023   Procedure: REVISION MAMMARY REDUCTION  (BREAST);  Surgeon: Glenna Fellows, MD;  Location: [facility];  Service: Plastics;  Laterality: Left;   MASTOPEXY Left 01/15/2016   Procedure: POSSIBLE LEFT MASTOPEXY;  Surgeon: Glenna Fellows, MD;  Location:  [facility];  Service: Plastics;  Laterality: Left;   PORT-A-CATH REMOVAL Right 01/15/2016   Procedure: REMOVAL OF RIGHT PORT-A-CATH;  Surgeon: Glenna Fellows, MD;  Location: [facility];  Service: Plastics;  Laterality: Right;   PORTACATH PLACEMENT Right 12/16/2014   Procedure: INSERTION PORT-A-CATH WITH ULTRASOUND;  Surgeon: Emelia Loron, MD;  Location: [facility];  Service: General;  Laterality: Right;   RADIOACTIVE SEED GUIDED PARTIAL MASTECTOMY WITH AXILLARY SENTINEL LYMPH NODE BIOPSY Right 06/15/2015   Procedure: RADIOACTIVE SEED GUIDED PARTIAL MASTECTOMY WITH AXILLARY SENTINEL LYMPH NODE BIOPSY;  Surgeon: Emelia Loron, MD;  Location: [facility];  Service: General;  Laterality: Right;   REDUCTION MAMMAPLASTY Bilateral 2017   Patient Active Problem List   Diagnosis Date Noted   Prediabetes 12/15/2022   Elevated sed rate 12/15/2022   Abnormal urinalysis 07/22/2022   Cervical cancer screening 05/05/2022   History of abnormal cervical Pap smear 05/05/2022   Unintentional weight loss 05/05/2022   Decreased GFR 02/03/2022   Encounter for hepatitis C screening test for low risk patient 02/03/2022   Routine screening for STI (sexually transmitted infection) 02/03/2022   Hyperlipidemia 02/03/2022   Obesity with serious comorbidity 02/03/2022   Elevated blood pressure reading 02/03/2022   Colon cancer screening 10/29/2021   Encounter for general adult medical examination with abnormal findings 10/29/2021   Screening for cardiovascular condition 10/07/2021   History of anemia 10/07/2021   Thyroid disorder screening  10/07/2021   Diabetes mellitus screening 10/07/2021   Obesity (BMI 30.0-34.9) 10/07/2021   Atypical glandular cells on cervical Pap smear on 12/31/18 02/22/2019   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 Claris Gladden  REFERRING DIAG: Hx of right Breast Cancer  THERAPY DIAG:  Malignant neoplasm of lower-inner quadrant of right female breast, unspecified estrogen receptor status (HCC)  Right arm pain  Lymphedema, not elsewhere classified  ONSET DATE: 01/03/2023  Rationale for Evaluation and Treatment: Rehabilitation  SUBJECTIVE:                                                                                                                                                                                           SUBJECTIVE STATEMENT:  I am having some sharp pains in my right shoulder that just came back after the most recent surgery when they used the Right arm for BP and needle sticks. My right arm  and right lateral trunk feels swollen. She tried the Basic pump that she has at home and  her arm and right lateral trunk was really sore.afterwards. She has been wearing her compression sleeve off and on. She got a new sleeve last year.  She is to do no lifting and is to take it easy on the left until she sees Engineer, petroleum on the 18th of September. She is wearing a sports bra until she sees MD next. She feels like the right breast is swollen and heavy as well.  PERTINENT HISTORY:  history of Rt lumpectomy on 06/15/15 with 0/6 lymph nodes positive.She had neoadjuvant chemotherapy. Completed radiation. On 06/23/15  she had bilateral breast reduction and an additional  left sided breast reduction 01/15/16 . Recently pt underwent a left revision reduction on 01/03/23 . At that time her right UE was used for BP and needle stick. Since then  she has had a sensation of fullness and discomfort in the right UE PAIN:  Are you having pain? Yes NPRS scale: 6/10 without meds Pain location: right shoulder throbs, hand tingles, right breast feels a little heavy and  sore Pain orientation: Right  PAIN TYPE: sharp, tight, and tingling Pain description: constant since surgery but varies in intensity Aggravating factors: cold, using arm above head,, wakes up from sleep, can't sleep on right side Relieving factors: warm compress,  PRECAUTIONS: right UE lymphedema,  Recent left breast  revision 01/03/2023. No left UE activity until after her next MD appt (9/18)  RED FLAGS: None   WEIGHT BEARING RESTRICTIONS: No  FALLS:  Has patient fallen in last 6 months? 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 Fibrosis at breast incision with nodular area inferior to areola  OBSERVATIONS / OTHER ASSESSMENTS: edema noted right lateral and posterior trunk, Right breast mildly enlarged pores, no fibrosis except at breast incision. Right dorsum of hand visibly swollen  SENSATION: Light touch: Deficits especially under arm.  POSTURE: forward head, rounded shoulders  UPPER EXTREMITY AROM/PROM:  A/PROM RIGHT   eval   Shoulder extension 32 tight/pain  Shoulder flexion 132 tight axilla  Shoulder abduction 120 with scapula depressed. Pain with compensation  Shoulder internal rotation 42 pull in chest  Shoulder external rotation     (Blank rows = not tested)  A/PROM LEFT   eval  Shoulder extension NT due to recent left breast surgery  Shoulder flexion   Shoulder abduction   Shoulder internal rotation   Shoulder  external rotation     (Blank rows = not tested)  CERVICAL AROM: All within functional limits:     UPPER EXTREMITY STRENGTH:   LYMPHEDEMA ASSESSMENTS:   SURGERY TYPE/DATE: Right Lumpectomy with SLNB 06/15/15 06/23/2015 bilateral breast reduction, 01/15/16 Left breast reduction 12/2022 Left breast revision reduction  NUMBER OF LYMPH NODES REMOVED: 0+/6  CHEMOTHERAPY: YES  RADIATION:YES  HORMONE TREATMENT: NO  INFECTIONS: NO   LYMPHEDEMA ASSESSMENTS:   LANDMARK RIGHT  eval  At axilla  36.8  15 cm proximal to olecranon process 39.7  10 cm proximal to olecranon process 37.5  Olecranon process 28.4  15 cm proximal to ulnar styloid process 29.3  10 cm proximal to ulnar styloid process 26.8  Just proximal to ulnar styloid process 18.6  Across hand at thumb web space 20.5  At base of 2nd digit 6.7  (Blank rows = not tested)  LANDMARK LEFT  eval  At axilla  39  15 cm proximal to olecranon process 39.6  10 cm proximal to olecranon process 38.6  Olecranon process 28.8  15 cm proximal to ulnar styloid process 29.2  10 cm proximal to ulnar styloid process 25.9  Just proximal to ulnar styloid process 19.0  Across hand at thumb web space 20.1  At base of 2nd digit 6.5  (Blank rows = not tested)   FUNCTIONAL TESTS:    GAIT: WNL   QUICK DASH SURVEY: 86% Lymphedema Life Impact Scale: 82%   TODAY'S TREATMENT:  DATE: 01/18/2023    PATIENT EDUCATION:  Education details: POC, treatment methods, cancellations, wear sleeve and glove Person educated: Patient Education method: Explanation Education comprehension: verbalized understanding  HOME EXERCISE PROGRAM:   ASSESSMENT:  CLINICAL IMPRESSION: Patient is a 47 y.o. female who was seen today for physical therapy evaluation and treatment for complaints of ongoing sensation of fullness  and discomfort in the right arm , breast and lateral trunk, and pain in the right shoulder She is s/p Right breast SLNB on 06/15/2015 and during a recent surgery on 01/03/23 for a revision of her left breast reduction she  had BP and needlestick to the right UE. Right shoulder ROM is now limited by pain, mainly occurring when she compensates with right UT for overhead activities. There is no significant swelling noted in the right UE presently, however, the dorsum of her hand is visibly swollen. Her Right breast is tender especially proximally, and the pores are slightly enlarged. There is an area of fibrosis distal to the areola in the incision area. The right lateral and posterior trunk is also swollen. She is not presently able to wear a compression bra due to her most recent left reduction revision. She will benefit from skilled PT to address deficits and return to PLOF. OBJECTIVE IMPAIRMENTS: decreased activity tolerance, decreased knowledge of condition, decreased ROM, decreased strength, increased edema, impaired sensation, impaired UE functional use, postural dysfunction, and pain.   ACTIVITY LIMITATIONS: carrying, lifting, sleeping, bed mobility, bathing, dressing, reach over head, and hygiene/grooming  PARTICIPATION LIMITATIONS: cleaning, shopping, and occupation  PERSONAL FACTORS: Past/current experiences and 1-2 comorbidities: Right breast Cancer with SLNB,chemo and radiation  are also affecting patient's functional outcome.   REHAB POTENTIAL: Good  CLINICAL DECISION MAKING: Stable/uncomplicated  EVALUATION COMPLEXITY: Low  GOALS: Goals reviewed with patient? Yes  SHORT TERM GOALS: Target date: 02/08/2023  Pt will be independent with a HEP for shoulder ROM/strength Baseline: Goal status: INITIAL  2.  Pt will be able to increase flex and Abd right shoulder without compensation by 10-15 degrees ea Baseline:  Goal status: INITIAL  3.  Pt will report decreased pain in right UQ by  25%- 30% Baseline:  Goal status: INITIAL  4.  Pt will have no visible dorsum of hand swelling Baseline:  Goal status: INITIAL  5.  Pts quick dash will be improved by 25 % or more to demonstrate improved function Baseline: 86 Goal status: INITIAL    LONG TERM GOALS: Target date: 03/01/2023  Pt will have decreased pain  60% or greater in the right UE and breast Baseline:  Goal status: INITIAL  2.  Pt will be able to sleep on her right side with minimal pain Baseline:  Goal status: INITIAL  3.  Pt will be able to upgrade her basic pump to a Flexitouch to improve swelling complaints Baseline:  Goal status: INITIAL  4.  Pts quick dash will be improved to 30% or less Baseline: 86% Goal status: INITIAL  5.  Pt will have new compression garments prn to reduce/maintain swelling Baseline:  Goal status: INITIAL  6.  Pt will be able to reach overhead, perform hair care and place objects on shelves with min pain/difficulty Baseline:  Goal status: INITIAL  PLAN:  PT FREQUENCY: 2x/week  PT DURATION: 6 weeks  PLANNED INTERVENTIONS: Therapeutic exercises, Patient/Family education, Self Care, Joint mobilization, Orthotic/Fit training, Manual lymph drainage, Compression bandaging, Taping, Vasopneumatic device, Manual therapy, and Re-evaluation  PLAN FOR NEXT SESSION: STM Right UQ, PROM right shoulder, (  holding activities on Left side until release by MD. MLD to trunk, Right  breast, UE prn. Can pt get a Flexitouch now? (Still has basic pump), order new garment, progress to strength/stabilization as pain improves  Waynette Buttery, PT 01/18/2023, 11:54 AM

## 2023-01-18 ENCOUNTER — Ambulatory Visit: Payer: No Typology Code available for payment source | Attending: Hematology and Oncology

## 2023-01-18 ENCOUNTER — Other Ambulatory Visit: Payer: Self-pay

## 2023-01-18 DIAGNOSIS — I890 Lymphedema, not elsewhere classified: Secondary | ICD-10-CM

## 2023-01-18 DIAGNOSIS — Z171 Estrogen receptor negative status [ER-]: Secondary | ICD-10-CM | POA: Insufficient documentation

## 2023-01-18 DIAGNOSIS — C50311 Malignant neoplasm of lower-inner quadrant of right female breast: Secondary | ICD-10-CM | POA: Diagnosis present

## 2023-01-18 DIAGNOSIS — M79601 Pain in right arm: Secondary | ICD-10-CM | POA: Insufficient documentation

## 2023-01-18 DIAGNOSIS — I972 Postmastectomy lymphedema syndrome: Secondary | ICD-10-CM | POA: Insufficient documentation

## 2023-01-23 ENCOUNTER — Ambulatory Visit: Payer: No Typology Code available for payment source

## 2023-01-27 ENCOUNTER — Inpatient Hospital Stay
Payer: No Typology Code available for payment source | Attending: Hematology and Oncology | Admitting: Hematology and Oncology

## 2023-01-27 VITALS — BP 146/95 | HR 89 | Temp 98.7°F | Wt 213.6 lb

## 2023-01-27 DIAGNOSIS — Z171 Estrogen receptor negative status [ER-]: Secondary | ICD-10-CM | POA: Diagnosis not present

## 2023-01-27 DIAGNOSIS — C50311 Malignant neoplasm of lower-inner quadrant of right female breast: Secondary | ICD-10-CM | POA: Diagnosis not present

## 2023-01-27 DIAGNOSIS — I890 Lymphedema, not elsewhere classified: Secondary | ICD-10-CM | POA: Diagnosis not present

## 2023-01-27 DIAGNOSIS — Z9221 Personal history of antineoplastic chemotherapy: Secondary | ICD-10-CM | POA: Diagnosis not present

## 2023-01-27 DIAGNOSIS — G629 Polyneuropathy, unspecified: Secondary | ICD-10-CM | POA: Insufficient documentation

## 2023-01-27 DIAGNOSIS — Z853 Personal history of malignant neoplasm of breast: Secondary | ICD-10-CM | POA: Insufficient documentation

## 2023-01-27 DIAGNOSIS — Z800 Family history of malignant neoplasm of digestive organs: Secondary | ICD-10-CM | POA: Diagnosis not present

## 2023-01-27 DIAGNOSIS — Z79899 Other long term (current) drug therapy: Secondary | ICD-10-CM | POA: Insufficient documentation

## 2023-01-27 DIAGNOSIS — Z923 Personal history of irradiation: Secondary | ICD-10-CM | POA: Diagnosis not present

## 2023-01-27 DIAGNOSIS — G43909 Migraine, unspecified, not intractable, without status migrainosus: Secondary | ICD-10-CM | POA: Insufficient documentation

## 2023-01-27 NOTE — Progress Notes (Signed)
[facility]  Telephone:([phone]) [phone] Fax:(336) [phone]     ID: Susan Wilson DOB: [birth year]  MR#: [id number]  ZHY#:[id number]  Patient Care Team: Elenore Paddy, NP as PCP - General (Nurse Practitioner) Emelia Loron, MD as Consulting Physician (General Surgery) Glendale Chard, DO as Consulting Physician (Neurology) Glenna Fellows, MD as Consulting Physician (Plastic Surgery) Rachel Moulds, MD as Consulting Physician (Hematology and Oncology)  CHIEF COMPLAINT: Triple negative breast cancer  CURRENT TREATMENT: Observation  INTERVAL HISTORY:   Discussed the use of AI scribe software for clinical note transcription with the patient, who gave verbal consent to proceed.  History of Present Illness   The patient, with a history of breast cancer and neuropathy, presents with intermittent left breast soreness. She attributes the soreness to recent breast reduction surgery performed by Dr. Claris Pong. She denies any other health issues, including changes in breathing, cough, chest pain, shortness of breath, bowel or urinary problems, falls, headaches, double vision, or seizures. She continues to experience regular migraines, which are a known issue for her.  The patient's neuropathy remains unchanged and is described as 'bad.' She is currently taking gabapentin for the neuropathy, which provides some relief but causes drowsiness.  She also mentions that she is on medical leave due to weakness in her arm, which limits her ability to bend back, move, lift, or clutch objects. She is scheduled for physical therapy for this issue.       REVIEW OF SYSTEMS: Review of Systems  Constitutional:  Negative for appetite change, chills, fatigue, fever and unexpected weight change.  HENT:   Negative for hearing loss, lump/mass and trouble swallowing.   Eyes:  Negative for eye problems and icterus.  Respiratory:  Negative for chest tightness, cough and shortness of breath.    Cardiovascular:  Negative for chest pain, leg swelling and palpitations.  Gastrointestinal:  Negative for abdominal distention, abdominal pain, constipation, diarrhea, nausea and vomiting.  Endocrine: Negative for hot flashes.  Genitourinary:  Negative for difficulty urinating.   Musculoskeletal:  Negative for arthralgias.  Skin:  Negative for itching and rash.  Neurological:  Negative for dizziness, extremity weakness, headaches and numbness.  Hematological:  Negative for adenopathy. Does not bruise/bleed easily.  Psychiatric/Behavioral:  Negative for depression. The patient is not nervous/anxious.       COVID 19 VACCINATION STATUS: refuses vaccination   BREAST CANCER HISTORY: From the original intake note:  [place] herself noted a mass in her right breast sometime around April. Initially she thought it might be related to menstruation, but as it did not change and eventually became tender, she brought it to her physician's attention. On 11/20/2014 patient underwent bilateral diagnostic mammography with tomosynthesis and right breast ultrasonography at the breast Center. The breast density was category B. There was a hyperdense mass in the right lower inner quadrant associated with skin thickening. There was also a 5 mm nodule posteriorly at the 8:30 o'clock position in the right breast. There were several hyperdense nodules in the right axilla. On physical exam there was a firm fixed mass in the right breast at the 5:00 position. By ultrasound this was lobulated and appear to involve the skin. It measures up to 4.1 cm. There was no sonographic correlation to the 5 mm nodule seen in a different area of the right breast. The right axilla showed 3 hypervascular lymph nodes with prominent cortical thickening, measuring less than 1.5 cm.  On 11/21/2014 the patient underwent right breast biopsy (5:00 mass) and biopsy  of one of the suspicious right axillary lymph nodes. The pathology ([id number])  showed the breast biopsy to consist of invasive ductal carcinoma, grade 2, estrogen receptor and progesterone receptor negative, with an MIB-1 of 20%, and HER-2 equivocal, with the signals ratio of 1.41, but the average copy number per cell 4.35.  The patient's subsequent history is as detailed below   PAST MEDICAL HISTORY: Past Medical History:  Diagnosis Date   Anemia    Breast asymmetry 12/2015   Breast cancer (HCC) 2017   right breast   History of breast cancer 11/2014   right   History of chemotherapy    Migraines    Neuropathy due to chemotherapeutic drug (HCC)    fingers and toes   Personal history of chemotherapy 2017   Personal history of radiation therapy 2017    PAST SURGICAL HISTORY: Past Surgical History:  Procedure Laterality Date   BREAST LUMPECTOMY Right 2017   BREAST RECONSTRUCTION Right 06/23/2015   Procedure: ONCOPLASTIC RIGHT BREAST REDUCTION;  Surgeon: Glenna Fellows, MD;  Location: [facility];  Service: Plastics;  Laterality: Right;   BREAST REDUCTION SURGERY Left 06/23/2015   Procedure: MAMMARY REDUCTION  (BREAST) LEFT BREAST FOR SYMETRY (BILATERAL BREAST REDUCTION);  Surgeon: Glenna Fellows, MD;  Location: [facility];  Service: Plastics;  Laterality: Left;   BREAST REDUCTION SURGERY Left 01/15/2016   Procedure: MAMMARY REDUCTION  (BREAST) LEFT;  Surgeon: Glenna Fellows, MD;  Location: [facility];  Service: Plastics;  Laterality: Left;   BREAST REDUCTION SURGERY Left 01/03/2023   Procedure: REVISION MAMMARY REDUCTION  (BREAST);  Surgeon: Glenna Fellows, MD;  Location: [facility];  Service: Plastics;  Laterality: Left;   MASTOPEXY Left 01/15/2016   Procedure: POSSIBLE LEFT MASTOPEXY;  Surgeon: Glenna Fellows, MD;  Location: [facility];  Service: Plastics;  Laterality: Left;   PORT-A-CATH REMOVAL Right 01/15/2016   Procedure: REMOVAL OF RIGHT PORT-A-CATH;  Surgeon: Glenna Fellows,  MD;  Location: [facility];  Service: Plastics;  Laterality: Right;   PORTACATH PLACEMENT Right 12/16/2014   Procedure: INSERTION PORT-A-CATH WITH ULTRASOUND;  Surgeon: Emelia Loron, MD;  Location: [facility];  Service: General;  Laterality: Right;   RADIOACTIVE SEED GUIDED PARTIAL MASTECTOMY WITH AXILLARY SENTINEL LYMPH NODE BIOPSY Right 06/15/2015   Procedure: RADIOACTIVE SEED GUIDED PARTIAL MASTECTOMY WITH AXILLARY SENTINEL LYMPH NODE BIOPSY;  Surgeon: Emelia Loron, MD;  Location:  SURGERY CENTER;  Service: General;  Laterality: Right;   REDUCTION MAMMAPLASTY Bilateral 2017    FAMILY HISTORY Family History  Problem Relation Age of Onset   Mesothelioma Maternal Grandfather        asbestos exposure, died in his 36s   Heart defect Sister 0   Breast cancer Paternal Grandmother    Diabetes Paternal Grandmother    Cancer Paternal Grandfather        NOS   Colon cancer Other 9       MGMs brother with colon cancer   Cancer Other        several of MGF's sisters with cancer NOS   Liver cancer Other        MGF's brother   Heart disease Paternal Aunt    Heart disease Paternal Uncle   The patient's parents are living, her father being 21 and her mother 61 as of July 2016. The patient had no brothers. One sister died at age 32 from cardiac problems. The other sister is in good health. On the maternal  side there is a history of colon cancer and an uncle age 16, liver cancer in a great uncle and mesothelioma in the maternal grandfather.   GYNECOLOGIC HISTORY:  Patient's last menstrual period was 12/22/2022 (exact date). Menarche age 6. The patient is GX P2. Her periods were interrupted with chemotherapy, but have resumed (August 2018), although now very scant and brief   SOCIAL HISTORY: (Updated 07/04/2018) Teairra works at [employer]. She formerly worked in Clinical biochemist for [employer]. Her husband Antrell works for [employer]. The daughters are [place]  and Ileene Patrick, age 82 and 79 as of 06/2018. Leida Lauth will be going to college for photography and videography. The patient attends a local nondenominational [employer]   ADVANCED DIRECTIVES: Not in place   HEALTH MAINTENANCE: Social History   Tobacco Use   Smoking status: Never   Smokeless tobacco: Never  Vaping Use   Vaping status: Never Used  Substance Use Topics   Alcohol use: Yes    Comment: rarely   Drug use: No     Colonoscopy: n/a (age)  PAP: 12/2018, HPV+, atypical cells / biopsy negative  Bone density: n/a (age)  Lipid panel:  No Known Allergies  Current Outpatient Medications  Medication Sig Dispense Refill   b complex vitamins tablet Take 1 tablet by mouth daily.     ferrous sulfate 325 (65 FE) MG tablet Take 325 mg by mouth daily with breakfast.     gabapentin (NEURONTIN) 300 MG capsule TAKE 1 CAPSULE BY MOUTH EVERYDAY AT BEDTIME 90 capsule 1   HYDROcodone-acetaminophen (NORCO/VICODIN) 5-325 MG tablet Take 1 tablet by mouth every 6 (six) hours as needed for moderate pain.     meloxicam (MOBIC) 15 MG tablet TAKE 1 TABLET BY MOUTH EVERY DAY 30 tablet 1   nortriptyline (PAMELOR) 10 MG capsule TAKE 1 CAPSULE BY MOUTH EVERYDAY AT BEDTIME 90 capsule 1   ondansetron (ZOFRAN) 4 MG tablet TAKE 1 TABLET BY MOUTH EVERY 8 HOURS AS NEEDED FOR NAUSEA AND VOMITING 30 tablet 0   rizatriptan (MAXALT) 5 MG tablet TAKE 1 TABLET BY MOUTH AS NEEDED FOR MIGRAINE. MAY REPEAT IN 2 HOURS IF NEEDED 10 tablet 0   vitamin E (VITAMIN E) 400 UNIT capsule Take 1 capsule (400 Units total) by mouth daily. 30 capsule 0   No current facility-administered medications for this visit.    OBJECTIVE: African-American woman who appears stated age Vitals:   01/27/23 1256  BP: (!) 146/95  Pulse: 89  Temp: 98.7 F (37.1 C)  SpO2: 100%      Body mass index is 35.54 kg/m.     Physical Exam Constitutional:      Appearance: Normal appearance.  Chest:     Comments: Bilateral breast  examined. Post surgical changes noted. No palpable masses or regional adenopathy Musculoskeletal:     Cervical back: Normal range of motion and neck supple. No rigidity.  Lymphadenopathy:     Cervical: No cervical adenopathy.  Neurological:     Mental Status: She is alert.      LAB RESULTS:  CMP     Latest Ref Rng & Units 12/26/2022   11:17 AM 10/07/2021   10:28 AM 07/16/2019    9:11 AM  CBC  WBC 4.0 - 10.5 K/uL 3.9  6.1  5.6   Hemoglobin 12.0 - 15.0 g/dL 69.6  29.5  28.4   Hematocrit 36.0 - 46.0 % 37.9  37.7  40.1   Platelets 150.0 - 400.0 K/uL 294.0  292.0  254  Latest Ref Rng & Units 12/26/2022   11:17 AM 07/22/2022    4:20 PM 02/03/2022   10:05 AM  CMP  Glucose 70 - 99 mg/dL 865  86  94   BUN 6 - 23 mg/dL 15  14  11    Creatinine 0.40 - 1.20 mg/dL 7.84  6.96  2.95   Sodium 135 - 145 mEq/L 135  137  135   Potassium 3.5 - 5.1 mEq/L 3.4  4.1  3.9   Chloride 96 - 112 mEq/L 101  103  100   CO2 19 - 32 mEq/L 26  27  28    Calcium 8.4 - 10.5 mg/dL 9.2  9.1  9.6   Total Protein 6.0 - 8.3 g/dL 7.5     Total Bilirubin 0.2 - 1.2 mg/dL 0.7     Alkaline Phos 39 - 117 U/L 55     AST 0 - 37 U/L 17     ALT 0 - 35 U/L 16         Urinalysis  STUDIES: No results found.   ASSESSMENT: 47 y.o. BRCA negative [place] woman s/p Right breast biopsy lower inner quadrant 11/21/2014 for a clinical T2 NX, stage 2 invasive ductal carcinoma, grade 2 or 3, estrogen and progesterone receptor negative, HER-2 equivocal, with an Mib-1 of 20%  (a) biopsy of a suspicious axillary lymph node same day was negative, but discordant  (b) review of 80 additional tumor cells by FISH still showed HER-2 not amplified; tumor should be treated as a triple negative  (1) neoadjuvant chemotherapy started 01/01/2015 consisting of cyclophosphamide and doxorubicin in dose dense fashion 4, completed 02/13/2015, followed by paclitaxel/ carboplatin weekly 12 started 02/26/2015, completed 05/14/2015.   (2)  genetics testing 12/15/2014 through the Breast/Ovarian gene panel offered by [facility] found no deleterious mutations in ATM, BARD1, BRCA1, BRCA2, BRIP1, CDH1, CHEK2, EPCAM, FANCC, MLH1, MSH2, MSH6, NBN, PALB2, PMS2, PTEN, RAD51C, RAD51D, TP53, and XRCC2  (3) right lumpectomy and sentinel lymph node sampling 06/15/2015 showed a residual pT1b pN0 invasive ductal carcinoma, grade 1, with negative margins. (There were actually a few microscopic nests of residual tumor and 0.9 is the longest distance between 2 of the microscopic nests--there was not enough tissue for repeat prognostic panel.)  (a) bilateral reduction mammoplasty 06/23/2015 showed no malignancy in either breast  (4) adjuvant radiation 09/01/2015-10/19/2015:     Right breast / 45 Gray @ 1.8 Wallace Cullens per fraction x 25 fractions Right breast boost / 16 Gray at [employer] per fraction x 8 fractions    PLAN:    Breast Cancer Status Post Reconstruction She is on active surveillance. Intermittent left breast soreness, possibly related to recent surgery. No evidence of recurrence on last mammogram in April. -Continue current management. -Next mammogram due in April of the following year.  Peripheral Neuropathy No change in symptoms, possibly secondary to chemotherapy. Gabapentin provides some relief but causes drowsiness. -Continue Gabapentin as tolerated.  Migraines Chronic condition, no change in frequency or severity. -Continue current management.  Lymphedema Swelling in the arm, possibly related to breast cancer treatment. Scheduled for physical therapy. -Continue with physical therapy as planned. -Complete necessary paperwork for medical leave related to physical therapy.  Follow-up in one year unless changes occur.      Total time spent: 30 minutes *Total Encounter Time as defined by the Centers for Medicare and Medicaid Services includes, in addition to the face-to-face time of a patient visit (documented in the note above)  non-face-to-face time: obtaining and  reviewing outside history, ordering and reviewing medications, tests or procedures, care coordination (communications with other health care professionals or caregivers) and documentation in the medical record.

## 2023-01-30 ENCOUNTER — Ambulatory Visit: Payer: No Typology Code available for payment source

## 2023-01-30 DIAGNOSIS — I972 Postmastectomy lymphedema syndrome: Secondary | ICD-10-CM | POA: Diagnosis not present

## 2023-01-30 DIAGNOSIS — C50311 Malignant neoplasm of lower-inner quadrant of right female breast: Secondary | ICD-10-CM

## 2023-01-30 DIAGNOSIS — I890 Lymphedema, not elsewhere classified: Secondary | ICD-10-CM

## 2023-01-30 DIAGNOSIS — M79601 Pain in right arm: Secondary | ICD-10-CM

## 2023-01-30 NOTE — Therapy (Signed)
OUTPATIENT PHYSICAL THERAPY  UPPER EXTREMITY ONCOLOGY TREATMENT  Patient Name: Susan Wilson MRN: [id number] DOB:Jul 05, 1975, 47 y.o., female Today's Date: 01/30/2023  END OF SESSION:  PT End of Session - 01/30/23 0909     Visit Number 2    Number of Visits 12    Date for PT Re-Evaluation 03/01/23    PT Start Time 0905    PT Stop Time 1000    PT Time Calculation (min) 55 min    Activity Tolerance Patient tolerated treatment well    Behavior During Therapy [facility] for tasks assessed/performed             Past Medical History:  Diagnosis Date   Anemia    Breast asymmetry 12/2015   Breast cancer (HCC) 2017   right breast   History of breast cancer 11/2014   right   History of chemotherapy    Migraines    Neuropathy due to chemotherapeutic drug (HCC)    fingers and toes   Personal history of chemotherapy 2017   Personal history of radiation therapy 2017   Past Surgical History:  Procedure Laterality Date   BREAST LUMPECTOMY Right 2017   BREAST RECONSTRUCTION Right 06/23/2015   Procedure: ONCOPLASTIC RIGHT BREAST REDUCTION;  Surgeon: Glenna Fellows, MD;  Location: [facility];  Service: Plastics;  Laterality: Right;   BREAST REDUCTION SURGERY Left 06/23/2015   Procedure: MAMMARY REDUCTION  (BREAST) LEFT BREAST FOR SYMETRY (BILATERAL BREAST REDUCTION);  Surgeon: Glenna Fellows, MD;  Location: [facility];  Service: Plastics;  Laterality: Left;   BREAST REDUCTION SURGERY Left 01/15/2016   Procedure: MAMMARY REDUCTION  (BREAST) LEFT;  Surgeon: Glenna Fellows, MD;  Location: [facility];  Service: Plastics;  Laterality: Left;   BREAST REDUCTION SURGERY Left 01/03/2023   Procedure: REVISION MAMMARY REDUCTION  (BREAST);  Surgeon: Glenna Fellows, MD;  Location: [facility];  Service: Plastics;  Laterality: Left;   MASTOPEXY Left 01/15/2016   Procedure: POSSIBLE LEFT MASTOPEXY;  Surgeon: Glenna Fellows, MD;  Location:  [facility];  Service: Plastics;  Laterality: Left;   PORT-A-CATH REMOVAL Right 01/15/2016   Procedure: REMOVAL OF RIGHT PORT-A-CATH;  Surgeon: Glenna Fellows, MD;  Location: [facility];  Service: Plastics;  Laterality: Right;   PORTACATH PLACEMENT Right 12/16/2014   Procedure: INSERTION PORT-A-CATH WITH ULTRASOUND;  Surgeon: Emelia Loron, MD;  Location: [facility];  Service: General;  Laterality: Right;   RADIOACTIVE SEED GUIDED PARTIAL MASTECTOMY WITH AXILLARY SENTINEL LYMPH NODE BIOPSY Right 06/15/2015   Procedure: RADIOACTIVE SEED GUIDED PARTIAL MASTECTOMY WITH AXILLARY SENTINEL LYMPH NODE BIOPSY;  Surgeon: Emelia Loron, MD;  Location: [facility];  Service: General;  Laterality: Right;   REDUCTION MAMMAPLASTY Bilateral 2017   Patient Active Problem List   Diagnosis Date Noted   Prediabetes 12/15/2022   Elevated sed rate 12/15/2022   Abnormal urinalysis 07/22/2022   Cervical cancer screening 05/05/2022   History of abnormal cervical Pap smear 05/05/2022   Unintentional weight loss 05/05/2022   Decreased GFR 02/03/2022   Encounter for hepatitis C screening test for low risk patient 02/03/2022   Routine screening for STI (sexually transmitted infection) 02/03/2022   Hyperlipidemia 02/03/2022   Obesity with serious comorbidity 02/03/2022   Elevated blood pressure reading 02/03/2022   Colon cancer screening 10/29/2021   Encounter for general adult medical examination with abnormal findings 10/29/2021   Screening for cardiovascular condition 10/07/2021   History of anemia 10/07/2021   Thyroid disorder screening  10/07/2021   Diabetes mellitus screening 10/07/2021   Obesity (BMI 30.0-34.9) 10/07/2021   Atypical glandular cells on cervical Pap smear on 12/31/18 02/22/2019   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 Claris Gladden  REFERRING DIAG: Hx of right Breast Cancer  THERAPY DIAG:  Malignant neoplasm of lower-inner quadrant of right female breast, unspecified estrogen receptor status (HCC)  Right arm pain  Lymphedema, not elsewhere classified  ONSET DATE: 01/03/2023  Rationale for Evaluation and Treatment: Rehabilitation  SUBJECTIVE:                                                                                                                                                                                           SUBJECTIVE STATEMENT:  The right side is a little achy with the weather. I tried my Flexitouch again yesterday and it still hurts a little in my shoulder. And my Rt axilla just feels like it's holding fluid.   PERTINENT HISTORY:  history of Rt lumpectomy on 06/15/15 with 0/6 lymph nodes positive.She had neoadjuvant chemotherapy. Completed radiation. On 06/23/15  she had bilateral breast reduction and an additional  left sided breast reduction 01/15/16 . Recently pt underwent a left revision reduction on 01/03/23 . At that time her right UE was used for BP and needle stick. Since then  she has had a sensation of fullness and discomfort in the right UE PAIN:  Are you having pain? Yes NPRS scale: 4/10  Pain location: right shoulder  Pain orientation: Right  PAIN TYPE: achy Pain description: constant since surgery but varies in intensity Aggravating factors: pump Relieving factors: warm compress, arnicare gel  PRECAUTIONS: right UE lymphedema,  Recent left breast revision 01/03/2023. No left UE activity until after her next MD appt (9/18)  RED FLAGS: None   WEIGHT BEARING RESTRICTIONS: No  FALLS:  Has patient fallen in last 6 months? 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 Fibrosis at breast incision with nodular area inferior to areola  OBSERVATIONS / OTHER ASSESSMENTS: edema noted right lateral and posterior trunk, Right breast mildly enlarged pores, no fibrosis except at breast incision. Right dorsum of hand visibly swollen  SENSATION: Light touch: Deficits especially under arm.  POSTURE: forward head, rounded shoulders  UPPER EXTREMITY AROM/PROM:  A/PROM RIGHT   eval   Shoulder extension 32 tight/pain  Shoulder flexion 132 tight axilla  Shoulder abduction 120 with scapula depressed. Pain with compensation  Shoulder internal rotation 42 pull in chest  Shoulder external rotation     (Blank rows = not tested)  A/PROM LEFT   eval  Shoulder extension NT due to recent left breast surgery  Shoulder flexion   Shoulder abduction   Shoulder internal rotation   Shoulder external rotation     (Blank rows = not tested)  CERVICAL AROM: All within functional limits:     UPPER EXTREMITY STRENGTH:   LYMPHEDEMA ASSESSMENTS:   SURGERY TYPE/DATE: Right Lumpectomy with SLNB 06/15/15 06/23/2015 bilateral breast reduction, 01/15/16 Left breast reduction 12/2022 Left breast revision reduction  NUMBER OF LYMPH NODES REMOVED: 0+/6  CHEMOTHERAPY: YES  RADIATION:YES  HORMONE TREATMENT: NO  INFECTIONS: NO   LYMPHEDEMA ASSESSMENTS:   LANDMARK RIGHT  eval  At axilla  36.8  15 cm proximal to olecranon process 39.7  10 cm proximal to olecranon process 37.5  Olecranon  process 28.4  15 cm proximal to ulnar styloid process 29.3  10 cm proximal to ulnar styloid process 26.8  Just proximal to ulnar styloid process 18.6  Across hand at thumb web space 20.5  At base of 2nd digit 6.7  (Blank rows = not tested)  LANDMARK LEFT  eval  At axilla  39  15 cm proximal to olecranon process 39.6  10 cm proximal to olecranon process 38.6  Olecranon process 28.8  15 cm proximal to ulnar styloid process 29.2  10 cm proximal to ulnar styloid process 25.9  Just proximal to ulnar styloid process 19.0  Across hand at thumb web space 20.1  At base of 2nd digit 6.5  (Blank rows = not tested)   FUNCTIONAL TESTS:    GAIT: WNL   QUICK DASH SURVEY: 86% Lymphedema Life Impact Scale: 82%   TODAY'S TREATMENT:                                                                                                                                          DATE:   01/30/23: Manual Therapy MLD: In Supine, short neck, superficial and deep abdominals, Rt inguinal nodes, Rt axillo-inguinal anastomosis, then focused on Rt lateral trunk and Rt UE working from proximal to distal then retracing all steps P/ROM to Rt shoulder into flex, abd and D2 with scapular depression throughout STM gently to tight muscle at Rt lateral trunk  and pectoralis insertion    PATIENT EDUCATION:  Education details: POC, treatment methods, cancellations, wear sleeve and glove Person educated: Patient Education method: Explanation Education comprehension: verbalized understanding  HOME EXERCISE PROGRAM:   ASSESSMENT:  CLINICAL IMPRESSION: First session of beginning manual therapy to Rt UE. Pt reports her tightness felt better after session today. She sees the srugeon after Wed PT appt and will hopefully be cleared for ROM then.    OBJECTIVE IMPAIRMENTS: decreased activity tolerance, decreased knowledge of condition, decreased ROM, decreased strength, increased edema, impaired sensation, impaired UE  functional use, postural dysfunction, and pain.   ACTIVITY LIMITATIONS: carrying, lifting, sleeping, bed mobility, bathing, dressing, reach over head, and hygiene/grooming  PARTICIPATION LIMITATIONS: cleaning, shopping, and occupation  PERSONAL FACTORS: Past/current experiences and 1-2 comorbidities: Right breast Cancer with SLNB,chemo and radiation  are also affecting patient's functional outcome.   REHAB POTENTIAL: Good  CLINICAL DECISION MAKING: Stable/uncomplicated  EVALUATION COMPLEXITY: Low  GOALS: Goals reviewed with patient? Yes  SHORT TERM GOALS: Target date: 02/08/2023  Pt will be independent with a HEP for shoulder ROM/strength Baseline: Goal status: INITIAL  2.  Pt will be able to increase flex and Abd right shoulder without compensation by 10-15 degrees ea Baseline:  Goal status: INITIAL  3.  Pt will report decreased pain in right UQ by 25%- 30% Baseline:  Goal status: INITIAL  4.  Pt will have no visible dorsum of hand swelling Baseline:  Goal status: INITIAL  5.  Pts quick dash will be improved by 25 % or more to demonstrate improved function Baseline: 86 Goal status: INITIAL    LONG TERM GOALS: Target date: 03/01/2023  Pt will have decreased pain  60% or greater in the right UE and breast Baseline:  Goal status: INITIAL  2.  Pt will be able to sleep on her right side with minimal pain Baseline:  Goal status: INITIAL  3.  Pt will be able to upgrade her basic pump to a Flexitouch to improve swelling complaints Baseline:  Goal status: INITIAL  4.  Pts quick dash will be improved to 30% or less Baseline: 86% Goal status: INITIAL  5.  Pt will have new compression garments prn to reduce/maintain swelling Baseline:  Goal status: INITIAL  6.  Pt will be able to reach overhead, perform hair care and place objects on shelves with min pain/difficulty Baseline:  Goal status: INITIAL  PLAN:  PT FREQUENCY: 2x/week  PT DURATION: 6  weeks  PLANNED INTERVENTIONS: Therapeutic exercises, Patient/Family education, Self Care, Joint mobilization, Orthotic/Fit training, Manual lymph drainage, Compression bandaging, Taping, Vasopneumatic device, Manual therapy, and Re-evaluation  PLAN FOR NEXT SESSION: STM Right UQ, PROM right shoulder, (holding activities on Left side until release by MD. MLD to trunk, Right  breast, UE prn. Can pt get a Flexitouch now? (DEMO and note sent to [employer] with pt permission 01/19/2023) (Still has basic pump), order new garment, progress to strength/stabilization as pain improves  Hermenia Bers, PTA 01/30/2023, 10:06 AM

## 2023-02-01 ENCOUNTER — Ambulatory Visit: Payer: No Typology Code available for payment source

## 2023-02-01 ENCOUNTER — Other Ambulatory Visit: Payer: Self-pay | Admitting: Hematology and Oncology

## 2023-02-01 DIAGNOSIS — M79601 Pain in right arm: Secondary | ICD-10-CM

## 2023-02-01 DIAGNOSIS — I972 Postmastectomy lymphedema syndrome: Secondary | ICD-10-CM | POA: Diagnosis not present

## 2023-02-01 DIAGNOSIS — I890 Lymphedema, not elsewhere classified: Secondary | ICD-10-CM

## 2023-02-01 DIAGNOSIS — C50311 Malignant neoplasm of lower-inner quadrant of right female breast: Secondary | ICD-10-CM

## 2023-02-01 NOTE — Therapy (Signed)
OUTPATIENT PHYSICAL THERAPY  UPPER EXTREMITY ONCOLOGY TREATMENT  Patient Name: Susan Wilson MRN: [id number] DOB:Aug 01, 1975, 47 y.o., female Today's Date: 02/01/2023  END OF SESSION:  PT End of Session - 02/01/23 0908     Visit Number 3    Number of Visits 12    Date for PT Re-Evaluation 03/01/23    PT Start Time 0905    PT Stop Time 0959    PT Time Calculation (min) 54 min    Activity Tolerance Patient tolerated treatment well    Behavior During Therapy [facility] for tasks assessed/performed             Past Medical History:  Diagnosis Date   Anemia    Breast asymmetry 12/2015   Breast cancer (HCC) 2017   right breast   History of breast cancer 11/2014   right   History of chemotherapy    Migraines    Neuropathy due to chemotherapeutic drug (HCC)    fingers and toes   Personal history of chemotherapy 2017   Personal history of radiation therapy 2017   Past Surgical History:  Procedure Laterality Date   BREAST LUMPECTOMY Right 2017   BREAST RECONSTRUCTION Right 06/23/2015   Procedure: ONCOPLASTIC RIGHT BREAST REDUCTION;  Surgeon: Glenna Fellows, MD;  Location: [facility];  Service: Plastics;  Laterality: Right;   BREAST REDUCTION SURGERY Left 06/23/2015   Procedure: MAMMARY REDUCTION  (BREAST) LEFT BREAST FOR SYMETRY (BILATERAL BREAST REDUCTION);  Surgeon: Glenna Fellows, MD;  Location: [facility];  Service: Plastics;  Laterality: Left;   BREAST REDUCTION SURGERY Left 01/15/2016   Procedure: MAMMARY REDUCTION  (BREAST) LEFT;  Surgeon: Glenna Fellows, MD;  Location: [facility];  Service: Plastics;  Laterality: Left;   BREAST REDUCTION SURGERY Left 01/03/2023   Procedure: REVISION MAMMARY REDUCTION  (BREAST);  Surgeon: Glenna Fellows, MD;  Location: [facility];  Service: Plastics;  Laterality: Left;   MASTOPEXY Left 01/15/2016   Procedure: POSSIBLE LEFT MASTOPEXY;  Surgeon: Glenna Fellows, MD;  Location:  [facility];  Service: Plastics;  Laterality: Left;   PORT-A-CATH REMOVAL Right 01/15/2016   Procedure: REMOVAL OF RIGHT PORT-A-CATH;  Surgeon: Glenna Fellows, MD;  Location: [facility];  Service: Plastics;  Laterality: Right;   PORTACATH PLACEMENT Right 12/16/2014   Procedure: INSERTION PORT-A-CATH WITH ULTRASOUND;  Surgeon: Emelia Loron, MD;  Location: [facility];  Service: General;  Laterality: Right;   RADIOACTIVE SEED GUIDED PARTIAL MASTECTOMY WITH AXILLARY SENTINEL LYMPH NODE BIOPSY Right 06/15/2015   Procedure: RADIOACTIVE SEED GUIDED PARTIAL MASTECTOMY WITH AXILLARY SENTINEL LYMPH NODE BIOPSY;  Surgeon: Emelia Loron, MD;  Location: [facility];  Service: General;  Laterality: Right;   REDUCTION MAMMAPLASTY Bilateral 2017   Patient Active Problem List   Diagnosis Date Noted   Prediabetes 12/15/2022   Elevated sed rate 12/15/2022   Abnormal urinalysis 07/22/2022   Cervical cancer screening 05/05/2022   History of abnormal cervical Pap smear 05/05/2022   Unintentional weight loss 05/05/2022   Decreased GFR 02/03/2022   Encounter for hepatitis C screening test for low risk patient 02/03/2022   Routine screening for STI (sexually transmitted infection) 02/03/2022   Hyperlipidemia 02/03/2022   Obesity with serious comorbidity 02/03/2022   Elevated blood pressure reading 02/03/2022   Colon cancer screening 10/29/2021   Encounter for general adult medical examination with abnormal findings 10/29/2021   Screening for cardiovascular condition 10/07/2021   History of anemia 10/07/2021   Thyroid disorder screening  10/07/2021   Diabetes mellitus screening 10/07/2021   Obesity (BMI 30.0-34.9) 10/07/2021   Atypical glandular cells on cervical Pap smear on 12/31/18 02/22/2019   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 Claris Gladden  REFERRING DIAG: Hx of right Breast Cancer  THERAPY DIAG:  Malignant neoplasm of lower-inner quadrant of right female breast, unspecified estrogen receptor status (HCC)  Right arm pain  Lymphedema, not elsewhere classified  ONSET DATE: 01/03/2023  Rationale for Evaluation and Treatment: Rehabilitation  SUBJECTIVE:                                                                                                                                                                                           SUBJECTIVE STATEMENT:  I see my surgeon today and hopefully I will be cleared for Lt UE exercise. I am playing phone tag with [employer] trying to get upgraded pump. My chest didn't feel as heavy after last time and that lasted until yesterday.   PERTINENT HISTORY:  history of Rt lumpectomy on 06/15/15 with 0/6 lymph nodes positive.She had neoadjuvant chemotherapy. Completed radiation. On 06/23/15  she had bilateral breast reduction and an additional  left sided breast reduction 01/15/16 . Recently pt underwent a left revision reduction on 01/03/23 . At that time her right UE was used for BP and needle stick. Since then  she has had a sensation of fullness and discomfort in the right UE PAIN:  Are you having pain? Yes NPRS scale: 4/10  Pain location: right shoulder  Pain orientation: Right  PAIN TYPE: achy Pain description: constant since surgery but varies in intensity Aggravating factors: pump Relieving factors: warm compress, arnicare gel  PRECAUTIONS: right UE lymphedema,  Recent left breast revision 01/03/2023. No left UE activity until after her next MD appt (9/18)  RED FLAGS: None   WEIGHT BEARING RESTRICTIONS: No  FALLS:  Has patient fallen in last 6 months? 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 Fibrosis at breast incision with nodular area inferior to areola  OBSERVATIONS / OTHER ASSESSMENTS: edema noted right lateral and posterior trunk, Right breast mildly enlarged pores, no fibrosis except at breast incision. Right dorsum of hand visibly swollen  SENSATION: Light touch: Deficits especially under arm.  POSTURE: forward head, rounded shoulders  UPPER EXTREMITY AROM/PROM:  A/PROM RIGHT   eval   Shoulder extension 32 tight/pain  Shoulder flexion 132 tight axilla  Shoulder abduction 120 with scapula depressed. Pain with compensation  Shoulder internal rotation 42 pull in chest  Shoulder external rotation     (Blank rows = not tested)  A/PROM LEFT   eval  Shoulder extension NT due to recent left breast surgery  Shoulder flexion   Shoulder abduction   Shoulder internal rotation   Shoulder external rotation     (Blank rows = not tested)  CERVICAL AROM: All within functional limits:     UPPER EXTREMITY STRENGTH:   LYMPHEDEMA ASSESSMENTS:   SURGERY TYPE/DATE: Right Lumpectomy with SLNB 06/15/15 06/23/2015 bilateral breast reduction, 01/15/16 Left breast reduction 12/2022 Left breast revision reduction  NUMBER OF LYMPH NODES REMOVED: 0+/6  CHEMOTHERAPY: YES  RADIATION:YES  HORMONE TREATMENT: NO  INFECTIONS: NO   LYMPHEDEMA ASSESSMENTS:   LANDMARK RIGHT  eval  At axilla  36.8  15 cm proximal to olecranon process 39.7  10 cm proximal to  olecranon process 37.5  Olecranon process 28.4  15 cm proximal to ulnar styloid process 29.3  10 cm proximal to ulnar styloid process 26.8  Just proximal to ulnar styloid process 18.6  Across hand at thumb web space 20.5  At base of 2nd digit 6.7  (Blank rows = not tested)  LANDMARK LEFT  eval  At axilla  39  15 cm proximal to olecranon process 39.6  10 cm proximal to olecranon process 38.6  Olecranon process 28.8  15 cm proximal to ulnar styloid process 29.2  10 cm proximal to ulnar styloid process 25.9  Just proximal to ulnar styloid process 19.0  Across hand at thumb web space 20.1  At base of 2nd digit 6.5  (Blank rows = not tested)   FUNCTIONAL TESTS:    GAIT: WNL   QUICK DASH SURVEY: 86% Lymphedema Life Impact Scale: 82%   TODAY'S TREATMENT:                                                                                                                                          DATE:   02/01/23: Manual Therapy MLD: In Supine, short neck, superficial and deep abdominals, Rt inguinal nodes, Rt axillo-inguinal anastomosis, then focused on Rt lateral trunk and Rt UE working from proximal to distal then retracing all steps P/ROM to Rt shoulder into flex, abd and D2 with scapular depression throughout STM gently to tight muscle  at Rt lateral trunk  01/30/23: Manual Therapy MLD: In Supine, short neck, superficial and deep abdominals, Rt inguinal nodes, Rt axillo-inguinal anastomosis, then focused on Rt lateral trunk and Rt UE working from proximal to distal then retracing all steps P/ROM to Rt shoulder into flex, abd and D2 with scapular depression throughout STM gently to tight muscle at Rt lateral trunk and pectoralis insertion    PATIENT EDUCATION:  Education details: POC, treatment methods, cancellations, wear sleeve and glove Person educated: Patient Education method: Explanation Education comprehension: verbalized understanding  HOME EXERCISE  PROGRAM:   ASSESSMENT:  CLINICAL IMPRESSION: Pt sees surgeon later today and will hopefully be cleared to begin exercises with Lt UE. Continued with manual therapy to Rt UE including MLD to Rt breast and UE. Gave pt phone number for [facility] to see if they accept her [employer] insurance to be measured for new compression sleeves there.    OBJECTIVE IMPAIRMENTS: decreased activity tolerance, decreased knowledge of condition, decreased ROM, decreased strength, increased edema, impaired sensation, impaired UE functional use, postural dysfunction, and pain.   ACTIVITY LIMITATIONS: carrying, lifting, sleeping, bed mobility, bathing, dressing, reach over head, and hygiene/grooming  PARTICIPATION LIMITATIONS: cleaning, shopping, and occupation  PERSONAL FACTORS: Past/current experiences and 1-2 comorbidities: Right breast Cancer with SLNB,chemo and radiation  are also affecting patient's functional outcome.   REHAB POTENTIAL: Good  CLINICAL DECISION MAKING: Stable/uncomplicated  EVALUATION COMPLEXITY: Low  GOALS: Goals reviewed with patient? Yes  SHORT TERM GOALS: Target date: 02/08/2023  Pt will be independent with a HEP for shoulder ROM/strength Baseline: Goal status: INITIAL  2.  Pt will be able to increase flex and Abd right shoulder without compensation by 10-15 degrees ea Baseline:  Goal status: INITIAL  3.  Pt will report decreased pain in right UQ by 25%- 30% Baseline:  Goal status: INITIAL  4.  Pt will have no visible dorsum of hand swelling Baseline:  Goal status: INITIAL  5.  Pts quick dash will be improved by 25 % or more to demonstrate improved function Baseline: 86 Goal status: INITIAL    LONG TERM GOALS: Target date: 03/01/2023  Pt will have decreased pain  60% or greater in the right UE and breast Baseline:  Goal status: INITIAL  2.  Pt will be able to sleep on her right side with minimal pain Baseline:  Goal status: INITIAL  3.  Pt will be able  to upgrade her basic pump to a Flexitouch to improve swelling complaints Baseline:  Goal status: INITIAL  4.  Pts quick dash will be improved to 30% or less Baseline: 86% Goal status: INITIAL  5.  Pt will have new compression garments prn to reduce/maintain swelling Baseline:  Goal status: INITIAL  6.  Pt will be able to reach overhead, perform hair care and place objects on shelves with min pain/difficulty Baseline:  Goal status: INITIAL  PLAN:  PT FREQUENCY: 2x/week  PT DURATION: 6 weeks  PLANNED INTERVENTIONS: Therapeutic exercises, Patient/Family education, Self Care, Joint mobilization, Orthotic/Fit training, Manual lymph drainage, Compression bandaging, Taping, Vasopneumatic device, Manual therapy, and Re-evaluation  PLAN FOR NEXT SESSION: STM Right UQ, PROM right shoulder, (holding activities on Left side until release by MD. MLD to trunk, Right  breast, UE prn. Can pt get a Flexitouch now? (DEMO and note sent to [employer] with pt permission 01/19/2023) (Still has basic pump), order new garment, progress to strength/stabilization as pain improves  Hermenia Bers, PTA 02/01/2023, 10:01 AM

## 2023-02-06 ENCOUNTER — Telehealth: Payer: Self-pay

## 2023-02-06 ENCOUNTER — Ambulatory Visit: Payer: No Typology Code available for payment source

## 2023-02-06 DIAGNOSIS — C50311 Malignant neoplasm of lower-inner quadrant of right female breast: Secondary | ICD-10-CM

## 2023-02-06 DIAGNOSIS — I972 Postmastectomy lymphedema syndrome: Secondary | ICD-10-CM | POA: Diagnosis not present

## 2023-02-06 DIAGNOSIS — I890 Lymphedema, not elsewhere classified: Secondary | ICD-10-CM

## 2023-02-06 DIAGNOSIS — M79601 Pain in right arm: Secondary | ICD-10-CM

## 2023-02-06 NOTE — Telephone Encounter (Signed)
Informed patient that her requested documents had been completed and faxed back to the company. Also emailed copies of the documents to patient as requested.

## 2023-02-06 NOTE — Therapy (Signed)
OUTPATIENT PHYSICAL THERAPY  UPPER EXTREMITY ONCOLOGY TREATMENT  Patient Name: Susan Wilson MRN: [id number] DOB:Feb 14, 1976, 47 y.o., female Today's Date: 02/06/2023  END OF SESSION:  PT End of Session - 02/06/23 1214     Visit Number 4    Number of Visits 12    Date for PT Re-Evaluation 03/01/23    PT Start Time 1210    PT Stop Time 1303    PT Time Calculation (min) 53 min    Activity Tolerance Patient tolerated treatment well    Behavior During Therapy [facility] for tasks assessed/performed             Past Medical History:  Diagnosis Date   Anemia    Breast asymmetry 12/2015   Breast cancer (HCC) 2017   right breast   History of breast cancer 11/2014   right   History of chemotherapy    Migraines    Neuropathy due to chemotherapeutic drug (HCC)    fingers and toes   Personal history of chemotherapy 2017   Personal history of radiation therapy 2017   Past Surgical History:  Procedure Laterality Date   BREAST LUMPECTOMY Right 2017   BREAST RECONSTRUCTION Right 06/23/2015   Procedure: ONCOPLASTIC RIGHT BREAST REDUCTION;  Surgeon: Glenna Fellows, MD;  Location: [facility];  Service: Plastics;  Laterality: Right;   BREAST REDUCTION SURGERY Left 06/23/2015   Procedure: MAMMARY REDUCTION  (BREAST) LEFT BREAST FOR SYMETRY (BILATERAL BREAST REDUCTION);  Surgeon: Glenna Fellows, MD;  Location: [facility];  Service: Plastics;  Laterality: Left;   BREAST REDUCTION SURGERY Left 01/15/2016   Procedure: MAMMARY REDUCTION  (BREAST) LEFT;  Surgeon: Glenna Fellows, MD;  Location: [facility];  Service: Plastics;  Laterality: Left;   BREAST REDUCTION SURGERY Left 01/03/2023   Procedure: REVISION MAMMARY REDUCTION  (BREAST);  Surgeon: Glenna Fellows, MD;  Location: [facility];  Service: Plastics;  Laterality: Left;   MASTOPEXY Left 01/15/2016   Procedure: POSSIBLE LEFT MASTOPEXY;  Surgeon: Glenna Fellows, MD;  Location:  [facility];  Service: Plastics;  Laterality: Left;   PORT-A-CATH REMOVAL Right 01/15/2016   Procedure: REMOVAL OF RIGHT PORT-A-CATH;  Surgeon: Glenna Fellows, MD;  Location: [facility];  Service: Plastics;  Laterality: Right;   PORTACATH PLACEMENT Right 12/16/2014   Procedure: INSERTION PORT-A-CATH WITH ULTRASOUND;  Surgeon: Emelia Loron, MD;  Location: [facility];  Service: General;  Laterality: Right;   RADIOACTIVE SEED GUIDED PARTIAL MASTECTOMY WITH AXILLARY SENTINEL LYMPH NODE BIOPSY Right 06/15/2015   Procedure: RADIOACTIVE SEED GUIDED PARTIAL MASTECTOMY WITH AXILLARY SENTINEL LYMPH NODE BIOPSY;  Surgeon: Emelia Loron, MD;  Location: [facility];  Service: General;  Laterality: Right;   REDUCTION MAMMAPLASTY Bilateral 2017   Patient Active Problem List   Diagnosis Date Noted   Prediabetes 12/15/2022   Elevated sed rate 12/15/2022   Abnormal urinalysis 07/22/2022   Cervical cancer screening 05/05/2022   History of abnormal cervical Pap smear 05/05/2022   Unintentional weight loss 05/05/2022   Decreased GFR 02/03/2022   Encounter for hepatitis C screening test for low risk patient 02/03/2022   Routine screening for STI (sexually transmitted infection) 02/03/2022   Hyperlipidemia 02/03/2022   Obesity with serious comorbidity 02/03/2022   Elevated blood pressure reading 02/03/2022   Colon cancer screening 10/29/2021   Encounter for general adult medical examination with abnormal findings 10/29/2021   Screening for cardiovascular condition 10/07/2021   History of anemia 10/07/2021   Thyroid disorder screening  10/07/2021   Diabetes mellitus screening 10/07/2021   Obesity (BMI 30.0-34.9) 10/07/2021   Atypical glandular cells on cervical Pap smear on 12/31/18 02/22/2019   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 Claris Gladden  REFERRING DIAG: Hx of right Breast Cancer  THERAPY DIAG:  Malignant neoplasm of lower-inner quadrant of right female breast, unspecified estrogen receptor status (HCC)  Right arm pain  Lymphedema, not elsewhere classified  ONSET DATE: 01/03/2023  Rationale for Evaluation and Treatment: Rehabilitation  SUBJECTIVE:                                                                                                                                                                                           SUBJECTIVE STATEMENT:  I saw Dr. Leta Baptist and she okayed me for gentle Lt UE A/ROM now as long as don't push into pain. I tried calling [facility] but didn't get anyone, left a message. I'll just go by there on my home.   PERTINENT HISTORY:  history of Rt lumpectomy on 06/15/15 with 0/6 lymph nodes positive.She had neoadjuvant chemotherapy. Completed radiation. On 06/23/15  she had bilateral breast reduction and an additional  left sided breast reduction 01/15/16 . Recently pt underwent a left revision reduction on 01/03/23 . At that time her right UE was used for BP and needle stick. Since then  she has had a sensation of fullness and discomfort in the right UE PAIN:  Are you having pain? Yes NPRS scale: 7/10  Pain location: right shoulder  Pain orientation: Right  PAIN TYPE: shooting pains up/down my arm and the side of my breast feels a little fluidy Pain description: constant since surgery but varies in intensity Aggravating factors: pump Relieving factors: warm compress, arnicare gel  PRECAUTIONS: right UE lymphedema,  Recent left breast revision 01/03/2023. No left UE activity until after her next MD appt (9/18)  RED FLAGS: None   WEIGHT BEARING RESTRICTIONS: No  FALLS:  Has patient fallen in last 6  months? 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 Fibrosis at breast incision with nodular area inferior to areola  OBSERVATIONS / OTHER ASSESSMENTS: edema noted right lateral and posterior trunk, Right breast mildly enlarged pores, no fibrosis except at breast incision. Right dorsum of hand visibly swollen  SENSATION: Light touch: Deficits especially under arm.  POSTURE: forward head, rounded shoulders  UPPER EXTREMITY AROM/PROM:  A/PROM RIGHT   eval   Shoulder extension 32 tight/pain  Shoulder flexion 132 tight axilla  Shoulder abduction 120 with scapula depressed. Pain with compensation  Shoulder internal rotation 42 pull in chest  Shoulder external rotation     (Blank rows = not tested)  A/PROM LEFT   eval  Shoulder extension NT due to recent left breast surgery  Shoulder flexion   Shoulder abduction   Shoulder internal rotation   Shoulder external rotation     (Blank rows = not tested)  CERVICAL AROM: All within functional limits:     UPPER EXTREMITY STRENGTH:   LYMPHEDEMA ASSESSMENTS:   SURGERY TYPE/DATE: Right Lumpectomy with SLNB 06/15/15 06/23/2015 bilateral breast reduction, 01/15/16 Left breast reduction 12/2022 Left breast revision reduction  NUMBER OF LYMPH NODES REMOVED: 0+/6  CHEMOTHERAPY: YES  RADIATION:YES  HORMONE TREATMENT: NO  INFECTIONS: NO   LYMPHEDEMA ASSESSMENTS:   LANDMARK RIGHT  eval  At  axilla  36.8  15 cm proximal to olecranon process 39.7  10 cm proximal to olecranon process 37.5  Olecranon process 28.4  15 cm proximal to ulnar styloid process 29.3  10 cm proximal to ulnar styloid process 26.8  Just proximal to ulnar styloid process 18.6  Across hand at thumb web space 20.5  At base of 2nd digit 6.7  (Blank rows = not tested)  LANDMARK LEFT  eval  At axilla  39  15 cm proximal to olecranon process 39.6  10 cm proximal to olecranon process 38.6  Olecranon process 28.8  15 cm proximal to ulnar styloid process 29.2  10 cm proximal to ulnar styloid process 25.9  Just proximal to ulnar styloid process 19.0  Across hand at thumb web space 20.1  At base of 2nd digit 6.5  (Blank rows = not tested)   FUNCTIONAL TESTS:    GAIT: WNL   QUICK DASH SURVEY: 86% Lymphedema Life Impact Scale: 82%   TODAY'S TREATMENT:                                                                                                                                          DATE:  02/06/23: Therapeutic Exercises Pulleys into flexion and scaption x 2 mins each, pt reports no pull felt at Lt healing incision with these motions Ball roll up wall more so with Rt into flexion  and then Rt UE abd x 10 each (felt pull at incision on Lt so did not push flex with that side) Manual Therapy MLD: In Supine, short neck, superficial and deep abdominals, Rt inguinal nodes, Rt axillo-inguinal anastomosis, then focused on Rt lateral trunk  and inferior breast focusing on areas of fibrosis at medial breast where peau d'orange also present then retracing all steps P/ROM to Rt shoulder into flex, abd and D2 with scapular depression throughout STM gently to tight muscle at Rt lateral trunk  02/01/23: Manual Therapy MLD: In Supine, short neck, superficial and deep abdominals, Rt inguinal nodes, Rt axillo-inguinal anastomosis, then focused on Rt lateral trunk and Rt UE working from proximal to distal then  retracing all steps P/ROM to Rt shoulder into flex, abd and D2 with scapular depression throughout STM gently to tight muscle at Rt lateral trunk  01/30/23: Manual Therapy MLD: In Supine, short neck, superficial and deep abdominals, Rt inguinal nodes, Rt axillo-inguinal anastomosis, then focused on Rt lateral trunk and Rt UE working from proximal to distal then retracing all steps P/ROM to Rt shoulder into flex, abd and D2 with scapular depression throughout STM gently to tight muscle at Rt lateral trunk and pectoralis insertion    PATIENT EDUCATION:  Education details: POC, treatment methods, cancellations, wear sleeve and glove Person educated: Patient Education method: Explanation Education comprehension: verbalized understanding  HOME EXERCISE PROGRAM:   ASSESSMENT:  CLINICAL IMPRESSION: Pt saw surgeon last week and she was cleared for gentle A/ROM on Lt so was able to begin pulleys and ball roll up wall being mindful of not overstretching on Lt side. Then continued with manual therapy to Rt side working to decrease at Rt lateral trunk and inferior breast where pt reports feeling congested today. Also continued with P/ROM stretching to end motions. Pt reports feeling tightness at lateral trunk and intermittent sharp pains during stretching. Suggested she go by [facility] after session today since she has had trouble getting them to call her back.    OBJECTIVE IMPAIRMENTS: decreased activity tolerance, decreased knowledge of condition, decreased ROM, decreased strength, increased edema, impaired sensation, impaired UE functional use, postural dysfunction, and pain.   ACTIVITY LIMITATIONS: carrying, lifting, sleeping, bed mobility, bathing, dressing, reach over head, and hygiene/grooming  PARTICIPATION LIMITATIONS: cleaning, shopping, and occupation  PERSONAL FACTORS: Past/current experiences and 1-2 comorbidities: Right breast Cancer with SLNB,chemo and radiation  are also  affecting patient's functional outcome.   REHAB POTENTIAL: Good  CLINICAL DECISION MAKING: Stable/uncomplicated  EVALUATION COMPLEXITY: Low  GOALS: Goals reviewed with patient? Yes  SHORT TERM GOALS: Target date: 02/08/2023  Pt will be independent with a HEP for shoulder ROM/strength Baseline: Goal status: INITIAL  2.  Pt will be able to increase flex and Abd right shoulder without compensation by 10-15 degrees ea Baseline:  Goal status: INITIAL  3.  Pt will report decreased pain in right UQ by 25%- 30% Baseline:  Goal status: INITIAL  4.  Pt will have no visible dorsum of hand swelling Baseline:  Goal status: INITIAL  5.  Pts quick dash will be improved by 25 % or more to demonstrate improved function Baseline: 86 Goal status: INITIAL    LONG TERM GOALS: Target date: 03/01/2023  Pt will have decreased pain  60% or greater in the right UE and breast Baseline:  Goal status: INITIAL  2.  Pt will be able to sleep on her right side with minimal pain Baseline:  Goal status: INITIAL  3.  Pt will be able to upgrade her basic pump to a Flexitouch to improve swelling complaints Baseline:  Goal status: INITIAL  4.  Pts quick dash will be improved to 30% or less Baseline: 86% Goal status: INITIAL  5.  Pt will have new compression garments prn to reduce/maintain swelling Baseline:  Goal status: INITIAL  6.  Pt will be able to reach overhead, perform hair care and place objects on shelves with min pain/difficulty Baseline:  Goal status: INITIAL  PLAN:  PT FREQUENCY: 2x/week  PT DURATION: 6 weeks  PLANNED INTERVENTIONS: Therapeutic exercises, Patient/Family education, Self Care, Joint mobilization, Orthotic/Fit training, Manual lymph drainage, Compression bandaging, Taping, Vasopneumatic device, Manual therapy, and Re-evaluation  PLAN FOR NEXT SESSION: STM Right UQ, PROM right shoulder. MLD to trunk, Right  breast, UE prn. Can pt get a Flexitouch now? (DEMO and  note sent to [employer] with pt permission 01/19/2023) (Still has basic pump), order new garment, progress to strength/stabilization as pain improves  Hermenia Bers, PTA 02/06/2023, 1:15 PM

## 2023-02-08 ENCOUNTER — Ambulatory Visit: Payer: No Typology Code available for payment source

## 2023-02-08 DIAGNOSIS — C50311 Malignant neoplasm of lower-inner quadrant of right female breast: Secondary | ICD-10-CM

## 2023-02-08 DIAGNOSIS — I890 Lymphedema, not elsewhere classified: Secondary | ICD-10-CM

## 2023-02-08 DIAGNOSIS — M79601 Pain in right arm: Secondary | ICD-10-CM

## 2023-02-08 DIAGNOSIS — I972 Postmastectomy lymphedema syndrome: Secondary | ICD-10-CM | POA: Diagnosis not present

## 2023-02-08 NOTE — Therapy (Signed)
OUTPATIENT PHYSICAL THERAPY  UPPER EXTREMITY ONCOLOGY TREATMENT  Patient Name: Susan Wilson MRN: [id number] DOB:07/30/1975, 47 y.o., female Today's Date: 02/08/2023  END OF SESSION:  PT End of Session - 02/08/23 0904     Visit Number 5    Number of Visits 12    Date for PT Re-Evaluation 03/01/23    PT Start Time 0902    PT Stop Time 0957    PT Time Calculation (min) 55 min    Activity Tolerance Patient tolerated treatment well    Behavior During Therapy [facility] for tasks assessed/performed             Past Medical History:  Diagnosis Date   Anemia    Breast asymmetry 12/2015   Breast cancer (HCC) 2017   right breast   History of breast cancer 11/2014   right   History of chemotherapy    Migraines    Neuropathy due to chemotherapeutic drug (HCC)    fingers and toes   Personal history of chemotherapy 2017   Personal history of radiation therapy 2017   Past Surgical History:  Procedure Laterality Date   BREAST LUMPECTOMY Right 2017   BREAST RECONSTRUCTION Right 06/23/2015   Procedure: ONCOPLASTIC RIGHT BREAST REDUCTION;  Surgeon: Glenna Fellows, MD;  Location: [facility];  Service: Plastics;  Laterality: Right;   BREAST REDUCTION SURGERY Left 06/23/2015   Procedure: MAMMARY REDUCTION  (BREAST) LEFT BREAST FOR SYMETRY (BILATERAL BREAST REDUCTION);  Surgeon: Glenna Fellows, MD;  Location: [facility];  Service: Plastics;  Laterality: Left;   BREAST REDUCTION SURGERY Left 01/15/2016   Procedure: MAMMARY REDUCTION  (BREAST) LEFT;  Surgeon: Glenna Fellows, MD;  Location: [facility];  Service: Plastics;  Laterality: Left;   BREAST REDUCTION SURGERY Left 01/03/2023   Procedure: REVISION MAMMARY REDUCTION  (BREAST);  Surgeon: Glenna Fellows, MD;  Location: [facility];  Service: Plastics;  Laterality: Left;   MASTOPEXY Left 01/15/2016   Procedure: POSSIBLE LEFT MASTOPEXY;  Surgeon: Glenna Fellows, MD;  Location:  [facility];  Service: Plastics;  Laterality: Left;   PORT-A-CATH REMOVAL Right 01/15/2016   Procedure: REMOVAL OF RIGHT PORT-A-CATH;  Surgeon: Glenna Fellows, MD;  Location: [facility];  Service: Plastics;  Laterality: Right;   PORTACATH PLACEMENT Right 12/16/2014   Procedure: INSERTION PORT-A-CATH WITH ULTRASOUND;  Surgeon: Emelia Loron, MD;  Location: [facility];  Service: General;  Laterality: Right;   RADIOACTIVE SEED GUIDED PARTIAL MASTECTOMY WITH AXILLARY SENTINEL LYMPH NODE BIOPSY Right 06/15/2015   Procedure: RADIOACTIVE SEED GUIDED PARTIAL MASTECTOMY WITH AXILLARY SENTINEL LYMPH NODE BIOPSY;  Surgeon: Emelia Loron, MD;  Location: [facility];  Service: General;  Laterality: Right;   REDUCTION MAMMAPLASTY Bilateral 2017   Patient Active Problem List   Diagnosis Date Noted   Prediabetes 12/15/2022   Elevated sed rate 12/15/2022   Abnormal urinalysis 07/22/2022   Cervical cancer screening 05/05/2022   History of abnormal cervical Pap smear 05/05/2022   Unintentional weight loss 05/05/2022   Decreased GFR 02/03/2022   Encounter for hepatitis C screening test for low risk patient 02/03/2022   Routine screening for STI (sexually transmitted infection) 02/03/2022   Hyperlipidemia 02/03/2022   Obesity with serious comorbidity 02/03/2022   Elevated blood pressure reading 02/03/2022   Colon cancer screening 10/29/2021   Encounter for general adult medical examination with abnormal findings 10/29/2021   Screening for cardiovascular condition 10/07/2021   History of anemia 10/07/2021   Thyroid disorder screening  10/07/2021   Diabetes mellitus screening 10/07/2021   Obesity (BMI 30.0-34.9) 10/07/2021   Atypical glandular cells on cervical Pap smear on 12/31/18 02/22/2019   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 Claris Gladden  REFERRING DIAG: Hx of right Breast Cancer  THERAPY DIAG:  Malignant neoplasm of lower-inner quadrant of right female breast, unspecified estrogen receptor status (HCC)  Right arm pain  Lymphedema, not elsewhere classified  ONSET DATE: 01/03/2023  Rationale for Evaluation and Treatment: Rehabilitation  SUBJECTIVE:                                                                                                                                                                                           SUBJECTIVE STATEMENT:  I think I slept wrong because my Lt shoulder is achy today.   PERTINENT HISTORY:  history of Rt lumpectomy on 06/15/15 with 0/6 lymph nodes positive.She had neoadjuvant chemotherapy. Completed radiation. On 06/23/15  she had bilateral breast reduction and an additional  left sided breast reduction 01/15/16 . Recently pt underwent a left revision reduction on 01/03/23 . At that time her right UE was used for BP and needle stick. Since then  she has had a sensation of fullness and discomfort in the right UE PAIN:  Are you having pain? Yes NPRS scale:3-4/10  Pain location: right shoulder  Pain orientation: Right  PAIN TYPE: achy Pain description: constant since surgery but varies in intensity Aggravating factors: pump Relieving factors: warm compress, arnicare gel  PRECAUTIONS: right UE lymphedema,  Recent left breast revision 01/03/2023. No left UE activity until after her next MD appt (9/18)  RED FLAGS: None   WEIGHT BEARING RESTRICTIONS: No  FALLS:  Has patient fallen in last 6 months? 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 Fibrosis at breast incision with nodular area inferior to areola  OBSERVATIONS /  OTHER ASSESSMENTS: edema noted right lateral and posterior trunk, Right breast mildly enlarged pores, no fibrosis except at breast incision. Right dorsum of hand visibly swollen  SENSATION: Light touch: Deficits especially under arm.  POSTURE: forward head, rounded shoulders  UPPER EXTREMITY AROM/PROM:  A/PROM RIGHT   eval   Shoulder extension 32 tight/pain  Shoulder flexion 132 tight axilla  Shoulder abduction 120 with scapula depressed. Pain with compensation  Shoulder internal rotation 42 pull in chest  Shoulder external rotation     (Blank rows = not tested)  A/PROM LEFT   eval  Shoulder extension NT due to recent left breast surgery  Shoulder flexion   Shoulder abduction   Shoulder internal rotation   Shoulder external rotation     (Blank rows = not tested)  CERVICAL AROM: All within functional limits:     UPPER EXTREMITY STRENGTH:   LYMPHEDEMA ASSESSMENTS:   SURGERY TYPE/DATE: Right Lumpectomy with SLNB 06/15/15 06/23/2015 bilateral breast reduction, 01/15/16 Left breast reduction 12/2022 Left breast revision reduction  NUMBER OF LYMPH NODES REMOVED: 0+/6  CHEMOTHERAPY: YES  RADIATION:YES  HORMONE TREATMENT: NO  INFECTIONS: NO   LYMPHEDEMA ASSESSMENTS:   LANDMARK RIGHT  eval  At axilla  36.8  15 cm proximal to olecranon process 39.7  10 cm proximal to olecranon process 37.5  Olecranon process 28.4  15 cm proximal to ulnar styloid process 29.3  10 cm proximal to ulnar styloid process 26.8  Just proximal  to ulnar styloid process 18.6  Across hand at thumb web space 20.5  At base of 2nd digit 6.7  (Blank rows = not tested)  LANDMARK LEFT  eval  At axilla  39  15 cm proximal to olecranon process 39.6  10 cm proximal to olecranon process 38.6  Olecranon process 28.8  15 cm proximal to ulnar styloid process 29.2  10 cm proximal to ulnar styloid process 25.9  Just proximal to ulnar styloid process 19.0  Across hand at thumb web space 20.1  At base of 2nd digit 6.5  (Blank rows = not tested)   FUNCTIONAL TESTS:    GAIT: WNL   QUICK DASH SURVEY: 86% Lymphedema Life Impact Scale: 82%   TODAY'S TREATMENT:                                                                                                                                          DATE:  02/08/23: Therapeutic Exercises Pulleys into flexion and Rt abd (pts Lt incision feeling achy so no stretching into abd with Lt UE) x 2 mins each Ball roll up wall with Rt into flexion and then Rt UE abd x 10 each  Manual Therapy MLD: In Supine, short neck, superficial and deep abdominals, Rt inguinal nodes, Rt axillo-inguinal anastomosis, then focused on Rt lateral trunk , inferior breast focusing on areas of fibrosis at medial breast where peau d'orange also present, and  included Rt UE due to pt report of feeling fullness working from proximal to distal then retracing all steps P/ROM to Rt shoulder into flex, abd and D2 with scapular depression throughout STM gently to tight muscle at Rt lateral trunk  02/06/23: Therapeutic Exercises Pulleys into flexion and scaption x 2 mins each, pt reports no pull felt at Lt healing incision with these motions Ball roll up wall more so with Rt into flexion and then Rt UE abd x 10 each (felt pull at incision on Lt so did not push flex with that side) Manual Therapy MLD: In Supine, short neck, superficial and deep abdominals, Rt inguinal nodes, Rt axillo-inguinal anastomosis, then focused on Rt lateral  trunk  and inferior breast focusing on areas of fibrosis at medial breast where peau d'orange also present then retracing all steps P/ROM to Rt shoulder into flex, abd and D2 with scapular depression throughout STM gently to tight muscle at Rt lateral trunk  02/01/23: Manual Therapy MLD: In Supine, short neck, superficial and deep abdominals, Rt inguinal nodes, Rt axillo-inguinal anastomosis, then focused on Rt lateral trunk and Rt UE working from proximal to distal then retracing all steps P/ROM to Rt shoulder into flex, abd and D2 with scapular depression throughout STM gently to tight muscle at Rt lateral trunk  01/30/23: Manual Therapy MLD: In Supine, short neck, superficial and deep abdominals, Rt inguinal nodes, Rt axillo-inguinal anastomosis, then focused on Rt lateral trunk and Rt UE working from proximal to distal then retracing all steps P/ROM to Rt shoulder into flex, abd and D2 with scapular depression throughout STM gently to tight muscle at Rt lateral trunk and pectoralis insertion    PATIENT EDUCATION:  Education details: POC, treatment methods, cancellations, wear sleeve and glove Person educated: Patient Education method: Explanation Education comprehension: verbalized understanding  HOME EXERCISE PROGRAM:   ASSESSMENT:  CLINICAL IMPRESSION: Pt comes in c/o Lt shoulder and incision feeling achy and pinching so mindful to not have pt stretch Lt UE today during AA/ROM exs for Rt UE. Continued with Lt UE AA/P/ROM and MLD working to decrease Lt UE tightness and feelings of fullness.   OBJECTIVE IMPAIRMENTS: decreased activity tolerance, decreased knowledge of condition, decreased ROM, decreased strength, increased edema, impaired sensation, impaired UE functional use, postural dysfunction, and pain.   ACTIVITY LIMITATIONS: carrying, lifting, sleeping, bed mobility, bathing, dressing, reach over head, and hygiene/grooming  PARTICIPATION LIMITATIONS: cleaning, shopping,  and occupation  PERSONAL FACTORS: Past/current experiences and 1-2 comorbidities: Right breast Cancer with SLNB,chemo and radiation  are also affecting patient's functional outcome.   REHAB POTENTIAL: Good  CLINICAL DECISION MAKING: Stable/uncomplicated  EVALUATION COMPLEXITY: Low  GOALS: Goals reviewed with patient? Yes  SHORT TERM GOALS: Target date: 02/08/2023  Pt will be independent with a HEP for shoulder ROM/strength Baseline: Goal status: INITIAL  2.  Pt will be able to increase flex and Abd right shoulder without compensation by 10-15 degrees ea Baseline:  Goal status: INITIAL  3.  Pt will report decreased pain in right UQ by 25%- 30% Baseline:  Goal status: INITIAL  4.  Pt will have no visible dorsum of hand swelling Baseline:  Goal status: INITIAL  5.  Pts quick dash will be improved by 25 % or more to demonstrate improved function Baseline: 86 Goal status: INITIAL    LONG TERM GOALS: Target date: 03/01/2023  Pt will have decreased pain  60% or greater in the right UE and breast Baseline:  Goal status: INITIAL  2.  Pt will be able to sleep on her right side with minimal pain Baseline:  Goal status: INITIAL  3.  Pt will be able to upgrade her basic pump to a Flexit[employer] to improve swelling complaints Baseline:  Goal status: INITIAL  4.  Pts quick dash will be improved to 30% or less Baseline: 86% Goal status: INITIAL  5.  Pt will have new compression garments prn to reduce/maintain swelling Baseline:  Goal status: INITIAL  6.  Pt will be able to reach overhead, perform hair care and place objects on shelves with min pain/difficulty Baseline:  Goal status: INITIAL  PLAN:  PT FREQUENCY: 2x/week  PT DURATION: 6 weeks  PLANNED INTERVENTIONS: Therapeutic exercises, Patient/Family education, Self Care, Joint mobilization, Orthotic/Fit training, Manual lymph drainage, Compression bandaging, Taping, Vasopneumatic device, Manual therapy, and  Re-evaluation  PLAN FOR NEXT SESSION: Assess goals at next session; STM Right UQ, PROM right shoulder. MLD to trunk, Right  breast, UE prn. Did pt talk to [employer]? Get appt with [facility] [place]? (DEMO and note sent to [employer] with pt permission 01/19/2023) (Still has basic pump), order new garment, progress to strength/stabilization as pain improves  Hermenia Bers, PTA 02/08/2023, 10:01 AM

## 2023-02-12 ENCOUNTER — Other Ambulatory Visit: Payer: Self-pay | Admitting: Hematology and Oncology

## 2023-02-12 DIAGNOSIS — C50311 Malignant neoplasm of lower-inner quadrant of right female breast: Secondary | ICD-10-CM

## 2023-02-13 ENCOUNTER — Ambulatory Visit: Payer: No Typology Code available for payment source

## 2023-02-13 DIAGNOSIS — M79601 Pain in right arm: Secondary | ICD-10-CM

## 2023-02-13 DIAGNOSIS — C50311 Malignant neoplasm of lower-inner quadrant of right female breast: Secondary | ICD-10-CM

## 2023-02-13 DIAGNOSIS — I972 Postmastectomy lymphedema syndrome: Secondary | ICD-10-CM | POA: Diagnosis not present

## 2023-02-13 DIAGNOSIS — I890 Lymphedema, not elsewhere classified: Secondary | ICD-10-CM
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
## 2023-02-15 ENCOUNTER — Ambulatory Visit: Payer: No Typology Code available for payment source | Attending: Hematology and Oncology

## 2023-02-15 DIAGNOSIS — C50311 Malignant neoplasm of lower-inner quadrant of right female breast: Secondary | ICD-10-CM | POA: Diagnosis present

## 2023-02-15 DIAGNOSIS — M79601 Pain in right arm: Secondary | ICD-10-CM | POA: Insufficient documentation

## 2023-02-15 DIAGNOSIS — I890 Lymphedema, not elsewhere classified: Secondary | ICD-10-CM | POA: Diagnosis present
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
## 2023-02-17 ENCOUNTER — Telehealth: Payer: Self-pay

## 2023-02-17 NOTE — Telephone Encounter (Signed)
Notified Patient of completion of requested Disability Forms.Fax transmission confirmation received. Release of Information form and request for records has been forwarded to [facility]. Copy of forms emailed to Patient as requested. No other needs or concerns voiced at this time.

## 2023-02-20 ENCOUNTER — Ambulatory Visit: Payer: No Typology Code available for payment source

## 2023-02-20 DIAGNOSIS — I890 Lymphedema, not elsewhere classified: Secondary | ICD-10-CM

## 2023-02-20 DIAGNOSIS — M79601 Pain in right arm: Secondary | ICD-10-CM

## 2023-02-20 DIAGNOSIS — C50311 Malignant neoplasm of lower-inner quadrant of right female breast: Secondary | ICD-10-CM | POA: Diagnosis not present
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
## 2023-02-22 ENCOUNTER — Ambulatory Visit: Payer: No Typology Code available for payment source

## 2023-02-22 DIAGNOSIS — M79601 Pain in right arm: Secondary | ICD-10-CM

## 2023-02-22 DIAGNOSIS — I890 Lymphedema, not elsewhere classified: Secondary | ICD-10-CM

## 2023-02-22 DIAGNOSIS — C50311 Malignant neoplasm of lower-inner quadrant of right female breast: Secondary | ICD-10-CM | POA: Diagnosis not present
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
## 2023-02-23 ENCOUNTER — Encounter: Payer: Self-pay | Admitting: Hematology and Oncology

## 2023-02-27 ENCOUNTER — Ambulatory Visit: Payer: No Typology Code available for payment source

## 2023-03-01 ENCOUNTER — Ambulatory Visit: Payer: No Typology Code available for payment source

## 2023-03-01 DIAGNOSIS — I890 Lymphedema, not elsewhere classified: Secondary | ICD-10-CM

## 2023-03-01 DIAGNOSIS — M79601 Pain in right arm: Secondary | ICD-10-CM

## 2023-03-01 DIAGNOSIS — C50311 Malignant neoplasm of lower-inner quadrant of right female breast: Secondary | ICD-10-CM
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
## 2023-03-06 ENCOUNTER — Ambulatory Visit: Payer: No Typology Code available for payment source

## 2023-03-06 DIAGNOSIS — I890 Lymphedema, not elsewhere classified: Secondary | ICD-10-CM

## 2023-03-06 DIAGNOSIS — C50311 Malignant neoplasm of lower-inner quadrant of right female breast: Secondary | ICD-10-CM

## 2023-03-06 DIAGNOSIS — M79601 Pain in right arm: Secondary | ICD-10-CM

## 2023-03-06 NOTE — Addendum Note (Signed)
Addended by: Waynette Buttery on: 03/06/2023 06:05 PM   Modules accepted: Orders
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
## 2023-03-08 ENCOUNTER — Ambulatory Visit: Payer: No Typology Code available for payment source

## 2023-03-08 DIAGNOSIS — C50311 Malignant neoplasm of lower-inner quadrant of right female breast: Secondary | ICD-10-CM

## 2023-03-08 DIAGNOSIS — M79601 Pain in right arm: Secondary | ICD-10-CM

## 2023-03-08 DIAGNOSIS — I890 Lymphedema, not elsewhere classified: Secondary | ICD-10-CM
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
## 2023-03-15 ENCOUNTER — Ambulatory Visit: Payer: No Typology Code available for payment source

## 2023-03-15 DIAGNOSIS — I890 Lymphedema, not elsewhere classified: Secondary | ICD-10-CM

## 2023-03-15 DIAGNOSIS — M79601 Pain in right arm: Secondary | ICD-10-CM

## 2023-03-15 DIAGNOSIS — C50311 Malignant neoplasm of lower-inner quadrant of right female breast: Secondary | ICD-10-CM | POA: Diagnosis not present
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
## 2023-03-17 ENCOUNTER — Other Ambulatory Visit (INDEPENDENT_AMBULATORY_CARE_PROVIDER_SITE_OTHER): Payer: No Typology Code available for payment source

## 2023-03-17 ENCOUNTER — Ambulatory Visit: Payer: No Typology Code available for payment source | Admitting: Nurse Practitioner

## 2023-03-17 DIAGNOSIS — E876 Hypokalemia: Secondary | ICD-10-CM | POA: Diagnosis not present

## 2023-03-17 LAB — BASIC METABOLIC PANEL
BUN: 14 mg/dL (ref 6–23)
CO2: 26 meq/L (ref 19–32)
Calcium: 8.9 mg/dL (ref 8.4–10.5)
Chloride: 102 meq/L (ref 96–112)
Creatinine, Ser: 0.66 mg/dL (ref 0.40–1.20)
GFR: 104.11 mL/min (ref >60.00)
Glucose, Bld: 93 mg/dL (ref 70–99)
Potassium: 4.4 meq/L (ref 3.5–5.1)
Sodium: 135 meq/L (ref 135–145)

## 2023-03-20 ENCOUNTER — Ambulatory Visit: Payer: No Typology Code available for payment source | Admitting: Rehabilitation

## 2023-03-22 ENCOUNTER — Ambulatory Visit: Payer: No Typology Code available for payment source | Admitting: Physical Therapy

## 2023-03-23 ENCOUNTER — Ambulatory Visit: Payer: No Typology Code available for payment source | Admitting: Nurse Practitioner

## 2023-03-27 ENCOUNTER — Ambulatory Visit: Payer: No Typology Code available for payment source | Attending: Hematology and Oncology

## 2023-03-27 DIAGNOSIS — I890 Lymphedema, not elsewhere classified: Secondary | ICD-10-CM | POA: Insufficient documentation

## 2023-03-27 DIAGNOSIS — M79601 Pain in right arm: Secondary | ICD-10-CM | POA: Insufficient documentation

## 2023-03-27 DIAGNOSIS — C50311 Malignant neoplasm of lower-inner quadrant of right female breast: Secondary | ICD-10-CM | POA: Insufficient documentation
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
## 2023-03-29 ENCOUNTER — Ambulatory Visit: Payer: No Typology Code available for payment source

## 2023-03-29 DIAGNOSIS — M79601 Pain in right arm: Secondary | ICD-10-CM

## 2023-03-29 DIAGNOSIS — I890 Lymphedema, not elsewhere classified: Secondary | ICD-10-CM

## 2023-03-29 DIAGNOSIS — C50311 Malignant neoplasm of lower-inner quadrant of right female breast: Secondary | ICD-10-CM | POA: Diagnosis not present
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
## 2023-04-03 ENCOUNTER — Ambulatory Visit: Payer: No Typology Code available for payment source

## 2023-04-03 DIAGNOSIS — M79601 Pain in right arm: Secondary | ICD-10-CM

## 2023-04-03 DIAGNOSIS — C50311 Malignant neoplasm of lower-inner quadrant of right female breast: Secondary | ICD-10-CM

## 2023-04-03 DIAGNOSIS — I890 Lymphedema, not elsewhere classified: Secondary | ICD-10-CM
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
## 2023-04-05 ENCOUNTER — Ambulatory Visit: Payer: No Typology Code available for payment source

## 2023-04-05 DIAGNOSIS — C50311 Malignant neoplasm of lower-inner quadrant of right female breast: Secondary | ICD-10-CM | POA: Diagnosis not present

## 2023-04-05 DIAGNOSIS — I890 Lymphedema, not elsewhere classified: Secondary | ICD-10-CM

## 2023-04-05 DIAGNOSIS — M79601 Pain in right arm: Secondary | ICD-10-CM
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
## 2023-04-07 ENCOUNTER — Encounter: Payer: Self-pay | Admitting: Hematology and Oncology

## 2023-04-15 ENCOUNTER — Other Ambulatory Visit: Payer: Self-pay | Admitting: Hematology and Oncology

## 2023-04-15 DIAGNOSIS — C50311 Malignant neoplasm of lower-inner quadrant of right female breast: Secondary | ICD-10-CM

## 2023-04-17 ENCOUNTER — Telehealth: Payer: Self-pay

## 2023-04-17 NOTE — Telephone Encounter (Signed)
Called her per Dr. Al Pimple, she refused the refill request for meloxicam. If she needs a refill please contact PCP for refill. She verbalized understanding.

## 2023-04-18 ENCOUNTER — Other Ambulatory Visit: Payer: Self-pay | Admitting: Hematology and Oncology

## 2023-04-19 ENCOUNTER — Ambulatory Visit: Payer: No Typology Code available for payment source | Admitting: Nurse Practitioner

## 2023-04-19 ENCOUNTER — Encounter: Payer: Self-pay | Admitting: Nurse Practitioner

## 2023-04-19 ENCOUNTER — Telehealth: Payer: Self-pay

## 2023-04-19 VITALS — BP 160/100 | HR 97 | Temp 98.7°F | Ht 65.0 in | Wt 217.0 lb

## 2023-04-19 DIAGNOSIS — G620 Drug-induced polyneuropathy: Secondary | ICD-10-CM | POA: Diagnosis not present

## 2023-04-19 DIAGNOSIS — T451X5A Adverse effect of antineoplastic and immunosuppressive drugs, initial encounter: Secondary | ICD-10-CM | POA: Diagnosis not present

## 2023-04-19 DIAGNOSIS — C50311 Malignant neoplasm of lower-inner quadrant of right female breast: Secondary | ICD-10-CM | POA: Diagnosis not present

## 2023-04-19 DIAGNOSIS — I10 Essential (primary) hypertension: Secondary | ICD-10-CM | POA: Diagnosis not present

## 2023-04-19 MED ORDER — AMLODIPINE BESYLATE 2.5 MG PO TABS: 2.5000 mg | ORAL_TABLET | Freq: Every day | ORAL | 0 refills | Status: DC

## 2023-04-19 MED ORDER — MELOXICAM 15 MG PO TABS: ORAL_TABLET | ORAL | 3 refills | Status: DC

## 2023-04-19 NOTE — Assessment & Plan Note (Signed)
Chronic, blood pressure above goal today 160/100 Risk vs benefit discussion regarding starting pharmacological therapy had today. Patient agrees to trying amlodipine 2.5 mg tablet by mouth once a day. Follow-up in 6 to 8 weeks to check blood pressure, patient also agrees to check blood pressure periodically at home and reach out with readings as needed and with any negative side effects

## 2023-04-19 NOTE — Assessment & Plan Note (Signed)
Chronic, seems to have been exacerbated with recent surgery Patient requesting referral to neurology, referral ordered today

## 2023-04-19 NOTE — Assessment & Plan Note (Signed)
Patient experiencing chronic pain, uses meloxicam as needed.  Would like refill.  Refill prescribed today.  We went over long-term risks versus benefit of this medication as well.

## 2023-04-19 NOTE — Progress Notes (Signed)
Established Patient Office Visit  Subjective   Patient ID: Susan Wilson, female    DOB: Jun 01, 1975  Age: 47 y.o. MRN: [id number]  Chief Complaint  Patient presents with   Follow-up    3 month f/u Migraines have gotten worse/ recent surgery may be the cause     Patient has today for follow-up.  Underwent breast reconstructive surgery related to her breast cancer treatment about 3 months ago.  Reports overall she is feeling worse than she was prior to surgery.  She reports worsening neuropathy and lymphedema.  Has been working with physical therapy and has a lymphedema machine that she is using at home as well.  Using compression as well.  Following with surgeon and oncologist.  Hypertension: Periodically checks blood pressure at home, reports blood pressure has been a bit elevated.  She believes this may be related to her pain.    Review of Systems  Respiratory:  Negative for shortness of breath.   Cardiovascular:  Negative for chest pain.  Neurological:  Positive for sensory change (worsening neuropathy).      Objective:     BP (!) 160/100 (BP Location: Left Arm, Patient Position: Sitting, Cuff Size: Normal)   Pulse 97   Temp 98.7 F (37.1 C) (Oral)   Ht 5\' 5"  (1.651 m)   Wt 217 lb (98.4 kg)   LMP 04/16/2023   SpO2 99%   BMI 36.11 kg/m  BP Readings from Last 3 Encounters:  04/19/23 (!) 160/100  01/27/23 (!) 146/95  01/03/23 (!) 149/100   Wt Readings from Last 3 Encounters:  04/19/23 217 lb (98.4 kg)  01/27/23 213 lb 9.6 oz (96.9 kg)  01/03/23 209 lb 3.5 oz (94.9 kg)      Physical Exam Vitals reviewed.  Constitutional:      General: She is not in acute distress.    Appearance: Normal appearance.  HENT:     Head: Normocephalic and atraumatic.  Neck:     Vascular: No carotid bruit.  Cardiovascular:     Rate and Rhythm: Normal rate and regular rhythm.     Pulses: Normal pulses.     Heart sounds: Normal heart sounds.  Pulmonary:     Effort:  Pulmonary effort is normal.     Breath sounds: Normal breath sounds.  Skin:    General: Skin is warm and dry.  Neurological:     General: No focal deficit present.     Mental Status: She is alert and oriented to person, place, and time.  Psychiatric:        Mood and Affect: Mood normal.        Behavior: Behavior normal.        Judgment: Judgment normal.      No results found for any visits on 04/19/23.    The 10-year ASCVD risk score (Arnett DK, et al., 2019) is: 7.7%    Assessment & Plan:   Problem List Items Addressed This Visit       Cardiovascular and Mediastinum   Hypertension - Primary    Chronic, blood pressure above goal today 160/100 Risk vs benefit discussion regarding starting pharmacological therapy had today. Patient agrees to trying amlodipine 2.5 mg tablet by mouth once a day. Follow-up in 6 to 8 weeks to check blood pressure, patient also agrees to check blood pressure periodically at home and reach out with readings as needed and with any negative side effects      Relevant Medications   amLODipine (NORVASC)  2.5 MG tablet     Nervous and Auditory   Chemotherapy-induced neuropathy (HCC)    Chronic, seems to have been exacerbated with recent surgery Patient requesting referral to neurology, referral ordered today      Relevant Orders   Ambulatory referral to Neurology     Other   Breast cancer of lower-inner quadrant of right female breast [facility])    Patient experiencing chronic pain, uses meloxicam as needed.  Would like refill.  Refill prescribed today.  We went over long-term risks versus benefit of this medication as well.      Relevant Medications   meloxicam (MOBIC) 15 MG tablet    Return in about 6 weeks (around 05/31/2023) for 6-8 weeks f/u with Maralyn Sago.    Elenore Paddy, NP

## 2023-04-19 NOTE — Telephone Encounter (Signed)
Notified patient that her [employer] return to work documents had been completed and faxed back to company. Fax confirmation received. Copy of documents placed up front for pick up at next visit. No further concerns at this time.

## 2023-04-27 ENCOUNTER — Encounter: Payer: Self-pay | Admitting: Neurology

## 2023-04-28 ENCOUNTER — Ambulatory Visit: Payer: No Typology Code available for payment source | Admitting: Nurse Practitioner

## 2023-05-31 ENCOUNTER — Ambulatory Visit: Payer: No Typology Code available for payment source | Admitting: Nurse Practitioner

## 2023-05-31 VITALS — BP 142/86 | HR 61 | Temp 97.8°F | Ht 65.0 in | Wt 217.2 lb

## 2023-05-31 DIAGNOSIS — I10 Essential (primary) hypertension: Secondary | ICD-10-CM

## 2023-05-31 MED ORDER — AMLODIPINE BESYLATE 5 MG PO TABS
5.0000 mg | ORAL_TABLET | Freq: Every day | ORAL | 1 refills | Status: DC
Start: 2023-05-31 — End: 2023-07-12

## 2023-05-31 NOTE — Progress Notes (Signed)
   Established Patient Office Visit  Subjective   Patient ID: Susan Wilson, female    DOB: 06-May-1976  Age: 48 y.o. MRN: [id number]  Chief Complaint  Patient presents with   Hypertension   Hypertension: Start amlodipine  2.5 mg/day at last office visit.  Overall tolerates well does feel some nausea if she takes on empty stomach but otherwise is tolerating it well.  Here for blood pressure recheck.    Review of Systems  Eyes:  Negative for blurred vision.  Respiratory:  Negative for shortness of breath.   Cardiovascular:  Negative for chest pain.  Neurological:  Negative for dizziness.      Objective:     BP (!) 142/86   Pulse 61   Temp 97.8 F (36.6 C) (Temporal)   Ht 5\' 5"  (1.651 m)   Wt 217 lb 4 oz (98.5 kg)   BMI 36.15 kg/m  BP Readings from Last 3 Encounters:  05/31/23 (!) 142/86  04/19/23 (!) 160/100  01/27/23 (!) 146/95   Wt Readings from Last 3 Encounters:  05/31/23 217 lb 4 oz (98.5 kg)  04/19/23 217 lb (98.4 kg)  01/27/23 213 lb 9.6 oz (96.9 kg)      Physical Exam Vitals reviewed.  Constitutional:      General: She is not in acute distress.    Appearance: Normal appearance.  HENT:     Head: Normocephalic and atraumatic.  Neck:     Vascular: No carotid bruit.  Cardiovascular:     Rate and Rhythm: Normal rate and regular rhythm.     Pulses: Normal pulses.     Heart sounds: Normal heart sounds.  Pulmonary:     Effort: Pulmonary effort is normal.     Breath sounds: Normal breath sounds.  Skin:    General: Skin is warm and dry.  Neurological:     General: No focal deficit present.     Mental Status: She is alert and oriented to person, place, and time.  Psychiatric:        Mood and Affect: Mood normal.        Behavior: Behavior normal.        Judgment: Judgment normal.      No results found for any visits on 05/31/23.    The 10-year ASCVD risk score (Arnett DK, et al., 2019) is: 4.8%    Assessment & Plan:   Problem List Items  Addressed This Visit       Cardiovascular and Mediastinum   Hypertension - Primary   Chronic, improved, remains above goal For shared decision making we will increase amlodipine  to 5 mg daily Follow-up in 6 to 8 weeks for blood pressure check Patient encouraged to reach out with any questions, concerns, or negative side effects.      Relevant Medications   amLODipine  (NORVASC ) 5 MG tablet    Return in about 6 weeks (around 07/12/2023) for F/U with Isa Manuel, may be virtual.    Zorita Hiss, NP

## 2023-05-31 NOTE — Assessment & Plan Note (Signed)
 Chronic, improved, remains above goal For shared decision making we will increase amlodipine  to 5 mg daily Follow-up in 6 to 8 weeks for blood pressure check Patient encouraged to reach out with any questions, concerns, or negative side effects.

## 2023-06-16 ENCOUNTER — Telehealth: Payer: Self-pay | Admitting: *Deleted

## 2023-06-16 NOTE — Telephone Encounter (Signed)
"  Susan Wilson with Susan Wilson [phone]) calling to confirm receipt of long-term disability form sent 06/07/2022.  Is Dr. Al Pimple going to recommend keeping Susan Wilson, January 21, 1976 out of work.  Voice mails to this number are confidential as this is my personal direct line."  "What is your turnover timeframe?" Connected with Susan Wilson.  Confirmed receipt on 04/08/2024.  Form is in process.  Provider has not yet returned form to CMA.  This nurse unaware of provider decision.  Turnover hopefully will be early next week if provider returns form to CMA who returns to office Monday2/07/2023. No further questions or needs.

## 2023-06-19 ENCOUNTER — Ambulatory Visit (INDEPENDENT_AMBULATORY_CARE_PROVIDER_SITE_OTHER): Payer: No Typology Code available for payment source | Admitting: Neurology

## 2023-06-19 ENCOUNTER — Encounter: Payer: Self-pay | Admitting: Neurology

## 2023-06-19 ENCOUNTER — Telehealth: Payer: Self-pay

## 2023-06-19 VITALS — BP 123/93 | HR 103 | Ht 65.0 in | Wt 220.0 lb

## 2023-06-19 DIAGNOSIS — G620 Drug-induced polyneuropathy: Secondary | ICD-10-CM | POA: Diagnosis not present

## 2023-06-19 DIAGNOSIS — T451X5A Adverse effect of antineoplastic and immunosuppressive drugs, initial encounter: Secondary | ICD-10-CM

## 2023-06-19 MED ORDER — NORTRIPTYLINE HCL 10 MG PO CAPS: 20.0000 mg | ORAL_CAPSULE | Freq: Every day | ORAL | 1 refills | Status: DC

## 2023-06-19 NOTE — Patient Instructions (Addendum)
If you would like to proceed with Nerve conduction study and EMG, please call my office to schedule  Increase nortriptyline to 20mg  at bedtime (take 2 tablets)

## 2023-06-19 NOTE — Progress Notes (Signed)
[facility] Clinic Note - Initial Visit   Date: 06/19/2023   Susan Wilson MRN: [id number] DOB: 12-Jan-1976   Dear Renda Rolls, NP:  Thank you for your kind referral of Verniece Lady Gary for consultation of neuropathy. Although her history is well known to you, please allow [place] to reiterate it for the purpose of our medical record. The patient was accompanied to the clinic by self.   Susan Wilson is a 48 y.o. right-handed female with invasive ductal carcinoma of the right breast (2016) s/p chemotherapy and radiation and mass resection (05/2015)  presenting for evaluation of neuropathy.   IMPRESSION/PLAN: Chemotherapy-induced neuropathy (2016) with recent worsening painful paresthesias.  She had not had any new medication and changes to trigger this worsening of symptoms.  Exam is relatively stable.  I discussed that NCS/EMG of the left arm and leg would give more information about her symptoms and evaluate for overlapping radiculopathy vs entrapment neuropathy, but she opted to think about this due to needle phobia.  For pain, I suggest increasing nortriptyine to 20mg  at bedtime and continue gabapetin 300mg  at bedtime (sedation).   Return to clinic in 4 months  ------------------------------------------------------------- History of present illness: She was last seen by me in 2018 for chemotherapy-induced neuropathy.  She was diagnosed with right invasive ductal carcinoma of the breast in July 2016.  She completed chemotherapy with cyclophosphamide and doxorubicin, followed by paclitaxel/ carboplatin weekly 12 started 02/26/2015, completed 05/14/2015.  She started having numbness, tingling, and burning sensation of the feet during her chemotherapy, which has persisted.    She underewent revision of left breast reduction in August and reports worsening numbness/tingling involving the fingers and toes since this time.  She takes gabapentin 300mg  at bedtime  and nortriptyline 10mg  at bedtime.    She works remotely for [employer].  She lives at home with two children. Nonsmoker.  She does not drink alcohol.   Out-side paper records, electronic medical record, and images have been reviewed where available and summarized as:  Lab Results  Component Value Date   HGBA1C 5.4 12/26/2022   Lab Results  Component Value Date   VITAMINB12 1,352 (H) 09/21/2016   Lab Results  Component Value Date   TSH 0.83 12/26/2022   Lab Results  Component Value Date   ESRSEDRATE 45 (H) 12/26/2022    Past Medical History:  Diagnosis Date   Anemia    Breast asymmetry 12/2015   Breast cancer (HCC) 2017   right breast   History of breast cancer 11/2014   right   History of chemotherapy    Migraines    Neuropathy due to chemotherapeutic drug (HCC)    fingers and toes   Personal history of chemotherapy 2017   Personal history of radiation therapy 2017    Past Surgical History:  Procedure Laterality Date   BREAST LUMPECTOMY Right 2017   BREAST RECONSTRUCTION Right 06/23/2015   Procedure: ONCOPLASTIC RIGHT BREAST REDUCTION;  Surgeon: Glenna Fellows, MD;  Location: [facility];  Service: Plastics;  Laterality: Right;   BREAST REDUCTION SURGERY Left 06/23/2015   Procedure: MAMMARY REDUCTION  (BREAST) LEFT BREAST FOR SYMETRY (BILATERAL BREAST REDUCTION);  Surgeon: Glenna Fellows, MD;  Location: [facility];  Service: Plastics;  Laterality: Left;   BREAST REDUCTION SURGERY Left 01/15/2016   Procedure: MAMMARY REDUCTION  (BREAST) LEFT;  Surgeon: Glenna Fellows, MD;  Location: [facility];  Service: Plastics;  Laterality: Left;   BREAST REDUCTION SURGERY  Left 01/03/2023   Procedure: REVISION MAMMARY REDUCTION  (BREAST);  Surgeon: Glenna Fellows, MD;  Location: [facility];  Service: Plastics;  Laterality: Left;   MASTOPEXY Left 01/15/2016   Procedure: POSSIBLE LEFT MASTOPEXY;  Surgeon: Glenna Fellows,  MD;  Location: [facility];  Service: Plastics;  Laterality: Left;   PORT-A-CATH REMOVAL Right 01/15/2016   Procedure: REMOVAL OF RIGHT PORT-A-CATH;  Surgeon: Glenna Fellows, MD;  Location: [facility];  Service: Plastics;  Laterality: Right;   PORTACATH PLACEMENT Right 12/16/2014   Procedure: INSERTION PORT-A-CATH WITH ULTRASOUND;  Surgeon: Emelia Loron, MD;  Location: [facility];  Service: General;  Laterality: Right;   RADIOACTIVE SEED GUIDED PARTIAL MASTECTOMY WITH AXILLARY SENTINEL LYMPH NODE BIOPSY Right 06/15/2015   Procedure: RADIOACTIVE SEED GUIDED PARTIAL MASTECTOMY WITH AXILLARY SENTINEL LYMPH NODE BIOPSY;  Surgeon: Emelia Loron, MD;  Location: [facility];  Service: General;  Laterality: Right;   REDUCTION MAMMAPLASTY Bilateral 2017     Medications:  Outpatient Encounter Medications as of 06/19/2023  Medication Sig   amLODipine (NORVASC) 5 MG tablet Take 1 tablet (5 mg total) by mouth daily.   b complex vitamins tablet Take 1 tablet by mouth daily.   ferrous sulfate 325 (65 FE) MG tablet Take 325 mg by mouth daily with breakfast.   gabapentin (NEURONTIN) 300 MG capsule TAKE 1 CAPSULE BY MOUTH EVERYDAY AT BEDTIME   HYDROcodone-acetaminophen (NORCO/VICODIN) 5-325 MG tablet Take 1 tablet by mouth every 6 (six) hours as needed for moderate pain.   meloxicam (MOBIC) 15 MG tablet TAKE 1 TABLET BY MOUTH EVERY DAY   nortriptyline (PAMELOR) 10 MG capsule TAKE 1 CAPSULE BY MOUTH EVERYDAY AT BEDTIME   ondansetron (ZOFRAN) 4 MG tablet TAKE 1 TABLET BY MOUTH EVERY 8 HOURS AS NEEDED FOR NAUSEA AND VOMITING   rizatriptan (MAXALT) 5 MG tablet TAKE 1 TABLET BY MOUTH AS NEEDED FOR MIGRAINE. MAY REPEAT IN 2 HOURS IF NEEDED   vitamin E (VITAMIN E) 400 UNIT capsule Take 1 capsule (400 Units total) by mouth daily.   No facility-administered encounter medications on file as of 06/19/2023.    Allergies: No Known Allergies  Family History: Family History   Problem Relation Age of Onset   Other Mother        back issues   Hypertension Mother    Hypertension Father    Heart defect Sister 0   Heart disease Paternal Aunt    Heart disease Paternal Uncle    Mesothelioma Maternal Grandfather        asbestos exposure, died in his 15s   Breast cancer Paternal Grandmother    Diabetes Paternal Grandmother    Cancer Paternal Grandfather        NOS   Colon cancer Other 16       MGMs brother with colon cancer   Cancer Other        several of MGF's sisters with cancer NOS   Liver cancer Other        MGF's brother    Social History: Social History   Tobacco Use   Smoking status: Never   Smokeless tobacco: Never  Vaping Use   Vaping status: Never Used  Substance Use Topics   Alcohol use: Yes    Comment: rarely   Drug use: No   Social History   Social History Narrative   Lives with husband.  They have two children.   She works at [employer] and retirement.  Highest level of education:  Associates degree         Are you right handed or left handed? Right Handed    Are you currently employed ? 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 No    Who lives with you? With Kids    What type of home do you live in: 1 story or 2 story? Lives in a one story home.         Vital Signs:  BP (!) 123/93   Pulse (!) 103   Ht 5\' 5"  (1.651 m)   Wt 220 lb (99.8 kg)   SpO2 99%   BMI 36.61 kg/m   Neurological Exam: MENTAL STATUS including orientation to time, place, person, recent and remote memory, attention span and concentration, language, and fund of knowledge is normal.  Speech is not dysarthric.  CRANIAL NERVES: II:  No visual field defects.     III-IV-VI: Pupils equal round and reactive to light.  Normal conjugate, extra-ocular eye movements in all directions of gaze.  No nystagmus.  No ptosis.   V:  Normal facial sensation.    VII:  Normal facial symmetry  and movements.   VIII:  Normal hearing and vestibular function.   IX-X:  Normal palatal movement.   XI:  Normal shoulder shrug and head rotation.   XII:  Normal tongue strength and range of motion, no deviation or fasciculation.  MOTOR:  Motor strength is 5/5 throughout. No atrophy, fasciculations or abnormal movements.  No pronator drift.   MSRs:                                           Right        Left brachioradialis 2+  2+  biceps 2+  2+  triceps 2+  2+  patellar 2+  2+  ankle jerk 1+  1+  Hoffman no  no  plantar response down  down   SENSORY:  Normal and symmetric perception of light touch, pinprick, vibration, and temperature.  Romberg's sign absent.   COORDINATION/GAIT: Normal finger-to- nose-finger.  Intact rapid alternating movements bilaterally. Gait narrow based and stable. Tandem and stressed gait intact.     Thank you for allowing me to participate in patient's care.  If I can answer any additional questions, I would be pleased to do so.    Sincerely,    Abeeha Twist K. Allena Katz, DO

## 2023-06-19 NOTE — Telephone Encounter (Signed)
Patient notified that [employer] long term disability documents had been completed and faxed back to company. Fax confirmation received. No further concerns at this time.

## 2023-07-07 NOTE — Progress Notes (Signed)
Received request for medical records from Patient's Disability Insurance. Request forwarded to [facility] Health Information Management  Office with signed Release of Information Form.

## 2023-07-12 ENCOUNTER — Ambulatory Visit (INDEPENDENT_AMBULATORY_CARE_PROVIDER_SITE_OTHER): Payer: No Typology Code available for payment source | Admitting: Nurse Practitioner

## 2023-07-12 VITALS — BP 120/82 | HR 85 | Temp 98.2°F | Ht 66.0 in | Wt 219.2 lb

## 2023-07-12 DIAGNOSIS — I10 Essential (primary) hypertension: Secondary | ICD-10-CM

## 2023-07-12 MED ORDER — AMLODIPINE BESYLATE 5 MG PO TABS
5.0000 mg | ORAL_TABLET | Freq: Every day | ORAL | 1 refills | Status: AC
Start: 2023-07-12 — End: ?

## 2023-07-12 NOTE — Progress Notes (Signed)
   Established Patient Office Visit  Subjective   Patient ID: Susan Wilson, female    DOB: [birth year]  Age: 48 y.o. MRN: [id number]  Chief Complaint  Patient presents with   Hypertension    Hypertension: Amlodipine increased from 2.5 to 5 mg/day at last office visit.  Patient reports is tolerating medication well.  No side effects.    Review of Systems  Constitutional:  Positive for malaise/fatigue.  Eyes:  Negative for blurred vision.  Respiratory:  Negative for shortness of breath.   Cardiovascular:  Negative for chest pain.  Neurological:  Negative for headaches.      Objective:     BP 120/82   Pulse 85   Temp 98.2 F (36.8 C) (Temporal)   Ht 5\' 6"  (1.676 m)   Wt 219 lb 4 oz (99.5 kg)   SpO2 100%   BMI 35.39 kg/m  BP Readings from Last 3 Encounters:  07/12/23 120/82  06/19/23 (!) 123/93  05/31/23 (!) 142/86   Wt Readings from Last 3 Encounters:  07/12/23 219 lb 4 oz (99.5 kg)  06/19/23 220 lb (99.8 kg)  05/31/23 217 lb 4 oz (98.5 kg)      Physical Exam Vitals reviewed.  Constitutional:      General: She is not in acute distress.    Appearance: Normal appearance.  HENT:     Head: Normocephalic and atraumatic.  Neck:     Vascular: No carotid bruit.  Cardiovascular:     Rate and Rhythm: Normal rate and regular rhythm.     Pulses: Normal pulses.     Heart sounds: Normal heart sounds.  Pulmonary:     Effort: Pulmonary effort is normal.     Breath sounds: Normal breath sounds.  Skin:    General: Skin is warm and dry.  Neurological:     General: No focal deficit present.     Mental Status: She is alert and oriented to person, place, and time.  Psychiatric:        Mood and Affect: Mood normal.        Behavior: Behavior normal.        Judgment: Judgment normal.      No results found for any visits on 07/12/23.    The 10-year ASCVD risk score (Arnett DK, et al., 2019) is: 2.3%    Assessment & Plan:   Problem List Items Addressed This  Visit       Cardiovascular and Mediastinum   Hypertension - Primary   Chronic, improved Blood pressure much improved today on amlodipine 5 mg daily.  Will continue on this dose, patient to follow-up for annual physical exam in about 7 months or to call office if she feels need to be seen sooner.  She reports her understanding.      Relevant Medications   amLODipine (NORVASC) 5 MG tablet    Return in about 7 months (around 02/09/2024) for CPE with Maralyn Sago.    Elenore Paddy, NP

## 2023-07-12 NOTE — Assessment & Plan Note (Signed)
 Chronic, improved Blood pressure much improved today on amlodipine 5 mg daily.  Will continue on this dose, patient to follow-up for annual physical exam in about 7 months or to call office if she feels need to be seen sooner.  She reports her understanding.

## 2023-08-18 NOTE — Progress Notes (Signed)
 Requested questionnaire completed and faxed to [employer]. Fax transmission confirmation received. Copy of questionnaire mailed to patient as requested. No other needs or concerns noted at this time.

## 2023-09-03 ENCOUNTER — Other Ambulatory Visit: Payer: Self-pay | Admitting: Adult Health

## 2023-10-12 ENCOUNTER — Other Ambulatory Visit: Payer: Self-pay

## 2023-10-12 ENCOUNTER — Encounter: Payer: Self-pay | Admitting: Hematology and Oncology

## 2023-10-12 DIAGNOSIS — C50311 Malignant neoplasm of lower-inner quadrant of right female breast: Secondary | ICD-10-CM

## 2023-10-13 ENCOUNTER — Encounter: Payer: Self-pay | Admitting: Hematology and Oncology

## 2023-10-23 ENCOUNTER — Ambulatory Visit: Payer: No Typology Code available for payment source | Admitting: Neurology

## 2023-10-27 ENCOUNTER — Ambulatory Visit
Admission: RE | Admit: 2023-10-27 | Discharge: 2023-10-27 | Disposition: A | Discharge status: Home / Self Care | Source: Ambulatory Visit | Attending: Hematology and Oncology | Admitting: Hematology and Oncology

## 2023-10-27 DIAGNOSIS — C50311 Malignant neoplasm of lower-inner quadrant of right female breast: Secondary | ICD-10-CM

## 2023-11-02 ENCOUNTER — Encounter: Payer: Self-pay | Admitting: Nurse Practitioner

## 2023-11-07 ENCOUNTER — Encounter: Payer: Self-pay | Admitting: Family Medicine

## 2023-11-07 ENCOUNTER — Other Ambulatory Visit: Payer: Self-pay | Admitting: Family Medicine

## 2023-11-07 ENCOUNTER — Telehealth (INDEPENDENT_AMBULATORY_CARE_PROVIDER_SITE_OTHER): Admitting: Family Medicine

## 2023-11-07 DIAGNOSIS — Z853 Personal history of malignant neoplasm of breast: Secondary | ICD-10-CM | POA: Diagnosis not present

## 2023-11-07 DIAGNOSIS — I10 Essential (primary) hypertension: Secondary | ICD-10-CM | POA: Diagnosis not present

## 2023-11-07 DIAGNOSIS — N926 Irregular menstruation, unspecified: Secondary | ICD-10-CM | POA: Diagnosis not present

## 2023-11-07 MED ORDER — NORETHINDRONE 0.35 MG PO TABS: 1.0000 | ORAL_TABLET | Freq: Every day | ORAL | 11 refills | Status: DC

## 2023-11-07 NOTE — Progress Notes (Unsigned)
 Virtual Visit via Video Note  I connected with Susan Wilson on 11/08/23 at  1:40 PM EDT by a video enabled telemedicine application and verified that I am speaking with the correct person using two identifiers.  Patient Location: Home Provider Location: Office/Clinic  I discussed the limitations, risks, security, and privacy concerns of performing an evaluation and management service by video and the availability of in person appointments. I also discussed with the patient that there may be a patient responsible charge related to this service. The patient expressed understanding and agreed to proceed.  Subjective: PCP: Elnor Lauraine BRAVO, NP  No chief complaint on file.  HPI 48 year old female presents for A/V visit for evaluation of irregular and heavy periods.  Reports she is not perimenopause. She is going on vacation, and is requesting something to help suppress her menstrual cycle while she is gone. Has history of hypertension and ER negative breast cancer. Not currently taking tamoxifen or other androgen. Not taking any other hormone replacement therapy.   ROS: Per HPI  Current Outpatient Medications:    amLODipine  (NORVASC ) 5 MG tablet, Take 1 tablet (5 mg total) by mouth daily., Disp: 90 tablet, Rfl: 1   b complex vitamins tablet, Take 1 tablet by mouth daily., Disp: , Rfl:    ferrous sulfate 325 (65 FE) MG tablet, Take 325 mg by mouth daily with breakfast., Disp: , Rfl:    gabapentin  (NEURONTIN ) 300 MG capsule, TAKE 1 CAPSULE BY MOUTH EVERYDAY AT BEDTIME, Disp: 90 capsule, Rfl: 2   HYDROcodone -acetaminophen  (NORCO/VICODIN) 5-325 MG tablet, Take 1 tablet by mouth every 6 (six) hours as needed for moderate pain., Disp: , Rfl:    meloxicam  (MOBIC ) 15 MG tablet, TAKE 1 TABLET BY MOUTH EVERY DAY, Disp: 90 tablet, Rfl: 3   norethindrone (MICRONOR) 0.35 MG tablet, TAKE 1 TABLET (0.35 MG TOTAL) BY MOUTH DAILY FOR 9 DAYS., Disp: 28 tablet, Rfl: 0   nortriptyline  (PAMELOR ) 10 MG  capsule, Take 2 capsules (20 mg total) by mouth at bedtime., Disp: 180 capsule, Rfl: 1   ondansetron  (ZOFRAN ) 4 MG tablet, TAKE 1 TABLET BY MOUTH EVERY 8 HOURS AS NEEDED FOR NAUSEA AND VOMITING, Disp: 30 tablet, Rfl: 0   rizatriptan  (MAXALT ) 5 MG tablet, TAKE 1 TABLET BY MOUTH AS NEEDED FOR MIGRAINE. MAY REPEAT IN 2 HOURS IF NEEDED, Disp: 10 tablet, Rfl: 0   vitamin E  (VITAMIN E ) 400 UNIT capsule, Take 1 capsule (400 Units total) by mouth daily., Disp: 30 capsule, Rfl: 0  Observations/Objective: There were no vitals filed for this visit. Physical Exam Vitals and nursing note reviewed.  Constitutional:      General: She is not in acute distress. HENT:     Head: Normocephalic and atraumatic.   Eyes:     Extraocular Movements: Extraocular movements intact.   Pulmonary:     Effort: Pulmonary effort is normal.   Musculoskeletal:     Cervical back: Normal range of motion.   Neurological:     General: No focal deficit present.     Mental Status: She is alert and oriented to person, place, and time.   Psychiatric:        Mood and Affect: Mood normal.        Behavior: Behavior normal.    Assessment and Plan: Irregular periods Assessment & Plan: Discussed at length, risks of short-term progesterone only use All questions answered, patient would like to proceed with 7-day use of Micronor to suppress menstruation for vacation  Hypertension, unspecified type Assessment & Plan: Controlled, stable   History of breast cancer  Discussed risks of progesterone use to suppress menstruation.  All questions answered.  Patient would like to proceed with very short-term 7-day Micronor use.  Follow Up Instructions: No follow-ups on file.   I discussed the assessment and treatment plan with the patient. The patient was provided an opportunity to ask questions, and all were answered. The patient agreed with the plan and demonstrated an understanding of the instructions.   The patient was  advised to call back or seek an in-person evaluation if the symptoms worsen or if the condition fails to improve as anticipated.  I spent 20 minutes on this patient encounter including counseling, diagnosis, treatment options, documentation, face-to-face time.    The above assessment and management plan was discussed with the patient. The patient verbalized understanding of and has agreed to the management plan.   Susan LITTIE Ku, FNP

## 2023-11-08 ENCOUNTER — Encounter: Payer: Self-pay | Admitting: Family Medicine

## 2023-11-08 DIAGNOSIS — N926 Irregular menstruation, unspecified: Secondary | ICD-10-CM | POA: Insufficient documentation

## 2023-11-08 DIAGNOSIS — Z853 Personal history of malignant neoplasm of breast: Secondary | ICD-10-CM | POA: Insufficient documentation

## 2023-11-08 NOTE — Assessment & Plan Note (Signed)
 Discussed at length, risks of short-term progesterone only use All questions answered, patient would like to proceed with 7-day use of Micronor to suppress menstruation for vacation

## 2023-11-08 NOTE — Assessment & Plan Note (Signed)
Controlled, stable.

## 2024-01-08 ENCOUNTER — Telehealth: Payer: Self-pay | Admitting: Hematology and Oncology

## 2024-01-08 NOTE — Telephone Encounter (Signed)
 I was unable to leave a voicemail as Susan Wilson's mailbox is full. I attempted to inform her of her re-scheduled appointment due to Dr. Walter admin time.

## 2024-01-14 ENCOUNTER — Other Ambulatory Visit: Payer: Self-pay | Admitting: Neurology

## 2024-01-17 ENCOUNTER — Encounter: Payer: Self-pay | Admitting: Nurse Practitioner

## 2024-01-18 ENCOUNTER — Telehealth: Payer: Self-pay | Admitting: Nurse Practitioner

## 2024-01-18 DIAGNOSIS — G43001 Migraine without aura, not intractable, with status migrainosus: Secondary | ICD-10-CM | POA: Diagnosis not present

## 2024-01-18 DIAGNOSIS — I10 Essential (primary) hypertension: Secondary | ICD-10-CM | POA: Diagnosis not present

## 2024-01-18 MED ORDER — NURTEC 75 MG PO TBDP
1.0000 | ORAL_TABLET | ORAL | 3 refills | Status: AC | PRN
Start: 2024-01-18 — End: ?

## 2024-01-18 NOTE — Telephone Encounter (Signed)
 Please initiate prior auth for nurtec. Has tried and failed sumatriptan  and rizatriptan . Has 2 migraines per week

## 2024-01-18 NOTE — Progress Notes (Signed)
   Established Patient Office Visit  An audio/visual tele-health visit was completed today for this patient. I connected with  Susan Wilson on 01/18/24 utilizing audio/visual technology and verified that I am speaking with the correct person using two identifiers. The patient was located at their home, and I was located at the office of [facility] during the encounter. I discussed the limitations of evaluation and management by telemedicine. The patient expressed understanding and agreed to proceed.     Subjective   Patient ID: Susan Wilson, female    DOB: 1976-03-24  Age: 48 y.o. MRN: [id number]  Chief Complaint  Patient presents with   Medical Management of Chronic Issues    7 month follow up,migraine     Migraine: Chronic, has 2 migraines per week.  Takes nortriptyline  for preventative treatment and rizatriptan  for abortive therapy.  Has tried sumatriptan  in the past with suboptimal response.  Rizatriptan  causes significant fatigue and sedation. Hypertension: Chronic, reports that home systolic blood pressure readings in the 110s.  Continues on amlodipine  5 mg daily and is tolerating well.    Review of Systems  Respiratory:  Negative for shortness of breath.   Cardiovascular:  Negative for chest pain.  Neurological:  Positive for headaches.      Objective:     There were no vitals taken for this visit.   Physical Exam Comprehensive physical exam not completed today as office visit was conducted remotely.  Patient appears well on video.  Patient was alert and oriented, and appeared to have appropriate judgment.   No results found for any visits on 01/18/24.    The 10-year ASCVD risk score (Arnett DK, et al., 2019) is: 2.3%    Assessment & Plan:   Problem List Items Addressed This Visit       Cardiovascular and Mediastinum   Migraine   Chronic Continue nortriptyline  for preventative therapy Has failed sumatriptan  and has  negative side effects from rizatriptan  Will trial Nurtec, request for prior authorization made today and prescription sent to pharmacy Discussed how to take medication and to reach out if symptoms persist or worsen      Relevant Medications   Rimegepant Sulfate (NURTEC) 75 MG TBDP   Hypertension - Primary   Chronic, stable Well-controlled on current regimen Continue amlodipine  5 mg daily       Return in about 3 months (around 04/18/2024) for CPE with Lauraine.    Lauraine FORBES Pereyra, NP

## 2024-01-18 NOTE — Assessment & Plan Note (Signed)
 Chronic, stable Well-controlled on current regimen Continue amlodipine  5 mg daily

## 2024-01-18 NOTE — Assessment & Plan Note (Signed)
 Chronic Continue nortriptyline  for preventative therapy Has failed sumatriptan  and has negative side effects from rizatriptan  Will trial Nurtec, request for prior authorization made today and prescription sent to pharmacy Discussed how to take medication and to reach out if symptoms persist or worsen

## 2024-01-19 ENCOUNTER — Telehealth: Payer: Self-pay

## 2024-01-19 ENCOUNTER — Encounter: Payer: Self-pay | Admitting: Hematology and Oncology

## 2024-01-19 ENCOUNTER — Other Ambulatory Visit (HOSPITAL_COMMUNITY): Payer: Self-pay

## 2024-01-19 NOTE — Telephone Encounter (Signed)
 Pharmacy Patient Advocate Encounter   Received notification from Pt Calls Messages that prior authorization for Nurtec 75mg  tabs is required/requested.   Insurance verification completed.   The patient is insured through [employer] [facility] .   Per test claim: The current 16 day co-pay is, $1,067.42.  No PA needed at this time. This test claim was processed through [facility]- copay amounts may vary at other pharmacies due to pharmacy/plan contracts, or as the patient moves through the different stages of their insurance plan.     Tried a prior authorization and it said it could not be processed.   Placed a call to the insurance, and per the representative, the patient does not have any active pharmacy coverage at the moment. Only a Nature conservation officer for prescriptions.

## 2024-01-22 ENCOUNTER — Telehealth: Payer: Self-pay | Admitting: *Deleted

## 2024-01-22 NOTE — Telephone Encounter (Signed)
 [employer] manager sent to pt mychart

## 2024-01-25 ENCOUNTER — Other Ambulatory Visit (HOSPITAL_COMMUNITY): Payer: Self-pay

## 2024-01-26 ENCOUNTER — Other Ambulatory Visit: Payer: Self-pay | Admitting: Family Medicine

## 2024-01-26 ENCOUNTER — Other Ambulatory Visit (HOSPITAL_COMMUNITY): Payer: Self-pay

## 2024-01-26 NOTE — Telephone Encounter (Signed)
 Pharmacy Patient Advocate Encounter  Insurance verification completed.   The patient is insured through [employer] [facility]   Per test claim: Refill too soon. PA is not needed at this time. Medication was filled 01/18/24. Next eligible fill date is 01/31/24.   [facility]

## 2024-01-29 ENCOUNTER — Ambulatory Visit: Payer: No Typology Code available for payment source | Admitting: Hematology and Oncology

## 2024-01-30 ENCOUNTER — Inpatient Hospital Stay: Payer: Self-pay | Attending: Hematology and Oncology | Admitting: Hematology and Oncology

## 2024-01-30 ENCOUNTER — Other Ambulatory Visit (HOSPITAL_COMMUNITY): Payer: Self-pay

## 2024-01-30 ENCOUNTER — Encounter: Payer: Self-pay | Admitting: Nurse Practitioner

## 2024-01-30 VITALS — BP 157/95 | HR 86 | Temp 98.0°F | Resp 18 | Wt 226.1 lb

## 2024-01-30 DIAGNOSIS — G43909 Migraine, unspecified, not intractable, without status migrainosus: Secondary | ICD-10-CM | POA: Insufficient documentation

## 2024-01-30 DIAGNOSIS — Z853 Personal history of malignant neoplasm of breast: Secondary | ICD-10-CM | POA: Diagnosis present

## 2024-01-30 DIAGNOSIS — I890 Lymphedema, not elsewhere classified: Secondary | ICD-10-CM | POA: Diagnosis not present

## 2024-01-30 DIAGNOSIS — C50311 Malignant neoplasm of lower-inner quadrant of right female breast: Secondary | ICD-10-CM

## 2024-01-30 DIAGNOSIS — Z800 Family history of malignant neoplasm of digestive organs: Secondary | ICD-10-CM | POA: Diagnosis not present

## 2024-01-30 DIAGNOSIS — Z171 Estrogen receptor negative status [ER-]: Secondary | ICD-10-CM | POA: Diagnosis not present

## 2024-01-30 DIAGNOSIS — G629 Polyneuropathy, unspecified: Secondary | ICD-10-CM | POA: Diagnosis not present

## 2024-01-30 NOTE — Progress Notes (Signed)
 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  History of Present Illness           REVIEW OF SYSTEMS: Review of Systems  Constitutional:  Negative for appetite change, chills, fatigue, fever and unexpected weight change.  HENT:   Negative for hearing loss, lump/mass and trouble swallowing.   Eyes:  Negative for eye problems and icterus.  Respiratory:  Negative for chest tightness, cough and shortness of breath.   Cardiovascular:  Negative for chest pain, leg swelling and palpitations.  Gastrointestinal:  Negative for abdominal distention, abdominal pain, constipation, diarrhea, nausea and vomiting.  Endocrine: Negative for hot flashes.  Genitourinary:  Negative for difficulty urinating.   Musculoskeletal:  Negative for arthralgias.  Skin:  Negative for itching and rash.  Neurological:  Negative for dizziness, extremity weakness, headaches and numbness.  Hematological:  Negative for adenopathy. Does not bruise/bleed easily.  Psychiatric/Behavioral:  Negative for depression. The patient is not nervous/anxious.       COVID 19 VACCINATION STATUS: refuses vaccination   BREAST CANCER HISTORY: From the original intake note:  Susan Wilson herself noted a mass in her right breast sometime around April. Initially she thought it might  be related to menstruation, but as it did not change and eventually became tender, she brought it to her physician's attention. On 11/20/2014 patient underwent bilateral diagnostic mammography with tomosynthesis and right breast ultrasonography at the breast Center. The breast density was category B. There was a hyperdense mass in the right lower inner quadrant associated with skin thickening. There was also a 5 mm nodule posteriorly at the 8:30 o'clock position in the right breast. There were several hyperdense nodules in the right axilla. On physical exam there was a firm fixed mass in the right breast at the 5:00 position. By ultrasound this was lobulated and appear to involve the skin. It measures up to 4.1 cm. There was no sonographic correlation to the 5 mm nodule seen in a different area of the right breast. The right axilla showed 3 hypervascular lymph nodes with prominent cortical thickening, measuring less than 1.5 cm.  On 11/21/2014 the patient underwent right breast biopsy (5:00 mass) and biopsy of one of the suspicious right axillary lymph nodes. The pathology ([id number]) showed the breast biopsy to consist of invasive ductal carcinoma, grade 2, estrogen receptor and progesterone receptor negative, with an MIB-1 of 20%, and HER-2 equivocal, with the signals ratio of 1.41, but the average copy number per cell 4.35.  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   Personal history of chemotherapy 2017   Personal history of radiation therapy 2017    PAST SURGICAL HISTORY: Past Surgical History:  Procedure Laterality Date   BREAST LUMPECTOMY  Right 2017   BREAST RECONSTRUCTION Right 06/23/2015   Procedure: ONCOPLASTIC RIGHT BREAST REDUCTION;  Surgeon: Earlis Ranks, MD;  Location: [facility];  Service: Plastics;  Laterality: Right;   BREAST REDUCTION SURGERY Left 06/23/2015   Procedure: MAMMARY REDUCTION  (BREAST) LEFT BREAST FOR SYMETRY (BILATERAL BREAST REDUCTION);  Surgeon: Earlis Ranks, MD;  Location: [facility];  Service: Plastics;  Laterality: Left;   BREAST REDUCTION SURGERY Left 01/15/2016   Procedure: MAMMARY REDUCTION  (BREAST) LEFT;  Surgeon: Earlis Ranks, MD;  Location: [facility];  Service: Plastics;  Laterality: Left;   BREAST REDUCTION SURGERY Left 01/03/2023   Procedure: REVISION MAMMARY REDUCTION  (BREAST);  Surgeon: Ranks Earlis, MD;  Location: [facility];  Service: Plastics;  Laterality: Left;   COSMETIC SURGERY     Breast reconstruction   MASTOPEXY Left 01/15/2016   Procedure: POSSIBLE LEFT MASTOPEXY;  Surgeon: Earlis Ranks, MD;  Location: [facility];  Service: Plastics;  Laterality: Left;   PORT-A-CATH REMOVAL Right 01/15/2016   Procedure: REMOVAL OF RIGHT PORT-A-CATH;  Surgeon: Earlis Ranks, MD;  Location: [facility];  Service: Plastics;  Laterality: Right;   PORTACATH PLACEMENT Right 12/16/2014   Procedure: INSERTION PORT-A-CATH WITH ULTRASOUND;  Surgeon: Donnice Bury, MD;  Location: [facility];  Service: General;  Laterality: Right;   RADIOACTIVE SEED GUIDED PARTIAL MASTECTOMY WITH AXILLARY SENTINEL LYMPH NODE BIOPSY Right 06/15/2015   Procedure: RADIOACTIVE SEED GUIDED PARTIAL MASTECTOMY WITH AXILLARY SENTINEL LYMPH NODE BIOPSY;  Surgeon: Donnice Bury, MD;  Location: [facility];  Service: General;  Laterality: Right;   REDUCTION MAMMAPLASTY Bilateral 2017    FAMILY HISTORY Family History  Problem Relation Age of Onset   Other Mother        back issues   Hypertension  Mother    Hypertension Father    Arthritis Father    Heart defect Sister 0   Asthma Sister    Heart disease Paternal Aunt    Heart disease Paternal Uncle    Mesothelioma Maternal Grandfather        asbestos exposure, died in his 78s   Breast cancer Paternal Grandmother    Diabetes Paternal Grandmother    Cancer Paternal Grandfather        NOS   Colon cancer Other 67       MGMs brother with colon cancer   Cancer Other        several of MGF's sisters with cancer NOS   Liver cancer Other        MGF's brother  The patient's parents are living, her father being 39 and her mother 4 as of July 2016. The patient had no brothers. One sister died at age 35 from cardiac problems. The other sister is in good health. On the maternal side there is a history of colon cancer and an uncle age 66, liver cancer in a great uncle and mesothelioma in the maternal grandfather.   GYNECOLOGIC HISTORY:  No LMP recorded. Menarche age 28. The patient is GX P2. Her periods were interrupted with chemotherapy, but have resumed (August 2018), although now very scant and brief   SOCIAL HISTORY: (Updated 07/04/2018) Kamie works at [employer]. 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 Her husband Antrell works for [employer]. The daughters are [place] and Idella, age 91 and 61 as of 06/2018. Karl will be going to college for photography and videography. The patient attends a local nondenominational [employer]   ADVANCED DIRECTIVES: Not in place   HEALTH MAINTENANCE: Social History   Tobacco Use   Smoking status: Never   Smokeless tobacco: Never  Vaping Use   Vaping status: Never Used  Substance Use Topics   Alcohol use: Yes    Comment: rarely   Drug use: No     Colonoscopy: n/a (age)  PAP: 12/2018, HPV+, atypical cells / biopsy negative  Bone density: n/a (age)  Lipid panel:  No Known Allergies  Current Outpatient Medications  Medication Sig Dispense Refill    amLODipine  (NORVASC ) 5 MG tablet Take 1 tablet (5 mg total) by mouth daily. 90 tablet 1   b complex vitamins tablet Take 1 tablet by mouth daily.     ferrous sulfate 325 (65 FE) MG tablet Take 325 mg by mouth daily with breakfast.     gabapentin  (NEURONTIN ) 300 MG capsule TAKE 1 CAPSULE BY MOUTH EVERYDAY AT BEDTIME 90 capsule 2   meloxicam  (MOBIC ) 15 MG tablet TAKE 1 TABLET BY MOUTH EVERY DAY 90 tablet 3   norethindrone  (MICRONOR ) 0.35 MG tablet TAKE 1 TABLET (0.35 MG TOTAL) BY MOUTH DAILY FOR 9 DAYS. 28 tablet 0   nortriptyline  (PAMELOR ) 10 MG capsule TAKE 2 CAPSULES BY MOUTH AT BEDTIME. 180 capsule 1   ondansetron  (ZOFRAN ) 4 MG tablet TAKE 1 TABLET BY MOUTH EVERY 8 HOURS AS NEEDED FOR NAUSEA AND VOMITING 30 tablet 0   Rimegepant Sulfate (NURTEC) 75 MG TBDP Take 1 tablet (75 mg total) by mouth every other day as needed (migraine). 8 tablet 3   rizatriptan  (MAXALT ) 5 MG tablet TAKE 1 TABLET BY MOUTH AS NEEDED FOR MIGRAINE. MAY REPEAT IN 2 HOURS IF NEEDED 10 tablet 0   vitamin E  (VITAMIN E ) 400 UNIT capsule Take 1 capsule (400 Units total) by mouth daily. 30 capsule 0   HYDROcodone -acetaminophen  (NORCO/VICODIN) 5-325 MG tablet Take 1 tablet by mouth every 6 (six) hours as needed for moderate pain. (Patient not taking: Reported on 01/30/2024)     No current facility-administered medications for this visit.    OBJECTIVE: African-American woman who appears stated age Vitals:   01/30/24 1303  BP: (!) 149/99  Pulse: 92  Resp: 18  Temp: 98 F (36.7 C)  SpO2: 100%      Body mass index is 36.49 kg/m.     Physical Exam Constitutional:      Appearance: Normal appearance.  Chest:     Comments: Bilateral breast examined. Post surgical changes noted. No palpable masses or regional adenopathy Musculoskeletal:     Cervical back: Normal range of motion and neck supple. No rigidity.  Lymphadenopathy:     Cervical: No cervical adenopathy.  Neurological:     Mental Status: She is alert.       LAB RESULTS:  CMP     Latest Ref Rng & Units 12/26/2022   11:17 AM 10/07/2021   10:28 AM 07/16/2019    9:11 AM  CBC  WBC 4.0 - 10.5 K/uL 3.9  6.1  5.6   Hemoglobin 12.0 - 15.0 g/dL 88.1  87.9  87.5   Hematocrit 36.0 - 46.0 % 37.9  37.7  40.1   Platelets 150.0 - 400.0 K/uL 294.0  292.0  254       Latest  Ref Rng & Units 03/17/2023    9:43 AM 12/26/2022   11:17 AM 07/22/2022    4:20 PM  CMP  Glucose 70 - 99 mg/dL 93  899  86   BUN 6 - 23 mg/dL 14  15  14    Creatinine 0.40 - 1.20 mg/dL 9.33  9.28  9.14   Sodium 135 - 145 mEq/L 135  135  137   Potassium 3.5 - 5.1 mEq/L 4.4  3.4  4.1   Chloride 96 - 112 mEq/L 102  101  103   CO2 19 - 32 mEq/L 26  26  27    Calcium 8.4 - 10.5 mg/dL 8.9  9.2  9.1   Total Protein 6.0 - 8.3 g/dL  7.5    Total Bilirubin 0.2 - 1.2 mg/dL  0.7    Alkaline Phos 39 - 117 U/L  55    AST 0 - 37 U/L  17    ALT 0 - 35 U/L  16        Urinalysis  STUDIES: No results found.   ASSESSMENT: 48 y.o. BRCA negative [place] woman s/p Right breast biopsy lower inner quadrant 11/21/2014 for a clinical T2 NX, stage 2 invasive ductal carcinoma, grade 2 or 3, estrogen and progesterone receptor negative, HER-2 equivocal, with an Mib-1 of 20%  (a) biopsy of a suspicious axillary lymph node same day was negative, but discordant  (b) review of 80 additional tumor cells by FISH still showed HER-2 not amplified; tumor should be treated as a triple negative  (1) neoadjuvant chemotherapy started 01/01/2015 consisting of cyclophosphamide  and doxorubicin  in dose dense fashion 4, completed 02/13/2015, followed by paclitaxel / carboplatin  weekly 12 started 02/26/2015, completed 05/14/2015.   (2) genetics testing 12/15/2014 through the Breast/Ovarian gene panel offered by [facility] found no deleterious mutations in ATM, BARD1, BRCA1, BRCA2, BRIP1, CDH1, CHEK2, EPCAM, FANCC, MLH1, MSH2, MSH6, NBN, PALB2, PMS2, PTEN, RAD51C, RAD51D, TP53, and XRCC2  (3) right lumpectomy and  sentinel lymph node sampling 06/15/2015 showed a residual pT1b pN0 invasive ductal carcinoma, grade 1, with negative margins. (There were actually a few microscopic nests of residual tumor and 0.9 is the longest distance between 2 of the microscopic nests--there was not enough tissue for repeat prognostic panel.)  (a) bilateral reduction mammoplasty 06/23/2015 showed no malignancy in either breast  (4) adjuvant radiation 09/01/2015-10/19/2015:     Right breast / 45 Gray @ 1.8 [place] per fraction x 25 fractions Right breast boost / 16 Gray at [employer] per fraction x 8 fractions    PLAN:    Follow-up in one year unless changes occur.      Total time spent: 30 minutes *Total Encounter Time as defined by the Centers for Medicare and Medicaid Services includes, in addition to the face-to-face time of a patient visit (documented in the note above) non-face-to-face time: obtaining and reviewing outside history, ordering and reviewing medications, tests or procedures, care coordination (communications with other health care professionals or caregivers) and documentation in the medical record.

## 2024-01-30 NOTE — Progress Notes (Signed)
 [facility]  [phone] [phone]     ID: Susan Wilson DOB: 24-Aug-1975  MR#: [id number]  RDW#:[id number]  Patient Care Team: Elnor Lauraine BRAVO, NP as PCP - General (Nurse Practitioner) Ebbie Cough, MD as Consulting Physician (General Surgery) Tobie Tonita POUR, DO as Consulting Physician (Neurology) Arelia Filippo, MD as Consulting Physician (Plastic Surgery) Loretha Ash, MD as Consulting Physician (Hematology and Oncology)  CHIEF COMPLAINT: Triple negative breast cancer  CURRENT TREATMENT: Observation  INTERVAL HISTORY:   Discussed the use of AI scribe software for clinical note transcription with the patient, who gave verbal consent to proceed.  History of Present Illness    Discussed the use of AI scribe software for clinical note transcription with the patient, who gave verbal consent to proceed.  History of Present Illness Susan Wilson is a 48 year old female with a history of breast cancer who presents for a follow-up visit.  She has not been hospitalized recently and has not started any new medications except for Nurtec, which was added last week for her migraines. She has not yet had the opportunity to assess its effectiveness.  She experiences an increase in muscle cramps, specifically 'Charlie horses' in her legs, despite maintaining hydration and continuing her supplements. She engages in regular exercise and stretching, which she believes is important to prevent these muscle cramps.  Her neuropathy symptoms remain unchanged. She continues to experience tightness in the skin on her left side but reports no significant pain.  She was diagnosed with breast cancer in 2016 and underwent surgery. Her last mammogram in June was normal. She experiences left breast soreness and has a history of lymphedema.  She works in Dietitian, assisting individuals with cancer and [employer], and finds her workplace supportive. She  transitioned from [employer] to [facility], where she feels the environment is more positive.    REVIEW OF SYSTEMS: Review of Systems  Constitutional:  Negative for appetite change, chills, fatigue, fever and unexpected weight change.  HENT:   Negative for hearing loss, lump/mass and trouble swallowing.   Eyes:  Negative for eye problems and icterus.  Respiratory:  Negative for chest tightness, cough and shortness of breath.   Cardiovascular:  Negative for chest pain, leg swelling and palpitations.  Gastrointestinal:  Negative for abdominal distention, abdominal pain, constipation, diarrhea, nausea and vomiting.  Endocrine: Negative for hot flashes.  Genitourinary:  Negative for difficulty urinating.   Musculoskeletal:  Negative for arthralgias.  Skin:  Negative for itching and rash.  Neurological:  Negative for dizziness, extremity weakness, headaches and numbness.  Hematological:  Negative for adenopathy. Does not bruise/bleed easily.  Psychiatric/Behavioral:  Negative for depression. The patient is not nervous/anxious.       COVID 19 VACCINATION STATUS: refuses vaccination   BREAST CANCER HISTORY: From the original intake note:  Susan Wilson herself noted a mass in her right breast sometime around April. Initially she thought it might be related to menstruation, but as it did not change and eventually became tender, she brought it to her physician's attention. On 11/20/2014 patient underwent bilateral diagnostic mammography with tomosynthesis and right breast ultrasonography at the breast Center. The breast density was category B. There was a hyperdense mass in the right lower inner quadrant associated with skin thickening. There was also a 5 mm nodule posteriorly at the 8:30 o'clock position in the right breast. There were several hyperdense nodules in the right axilla. On physical exam there was a firm fixed mass in the  right breast at the 5:00 position. By ultrasound this was  lobulated and appear to involve the skin. It measures up to 4.1 cm. There was no sonographic correlation to the 5 mm nodule seen in a different area of the right breast. The right axilla showed 3 hypervascular lymph nodes with prominent cortical thickening, measuring less than 1.5 cm.  On 11/21/2014 the patient underwent right breast biopsy (5:00 mass) and biopsy of one of the suspicious right axillary lymph nodes. The pathology ([id number]) showed the breast biopsy to consist of invasive ductal carcinoma, grade 2, estrogen receptor and progesterone receptor negative, with an MIB-1 of 20%, and HER-2 equivocal, with the signals ratio of 1.41, but the average copy number per cell 4.35.  The patient's subsequent history is as detailed below   PAST MEDICAL HISTORY: Past Medical History:  Diagnosis Date   Allergy    Bee stings, pollen, grass   Anemia    Breast asymmetry 12/2015   Breast cancer (HCC) 2017   right breast   History of breast cancer 11/2014   right   History of chemotherapy    Migraines    Neuropathy due to chemotherapeutic drug (HCC)    fingers and toes   Personal history of chemotherapy 2017   Personal history of radiation therapy 2017    PAST SURGICAL HISTORY: Past Surgical History:  Procedure Laterality Date   BREAST LUMPECTOMY Right 2017   BREAST RECONSTRUCTION Right 06/23/2015   Procedure: ONCOPLASTIC RIGHT BREAST REDUCTION;  Surgeon: Earlis Ranks, MD;  Location: [facility];  Service: Plastics;  Laterality: Right;   BREAST REDUCTION SURGERY Left 06/23/2015   Procedure: MAMMARY REDUCTION  (BREAST) LEFT BREAST FOR SYMETRY (BILATERAL BREAST REDUCTION);  Surgeon: Earlis Ranks, MD;  Location: [facility];  Service: Plastics;  Laterality: Left;   BREAST REDUCTION SURGERY Left 01/15/2016   Procedure: MAMMARY REDUCTION  (BREAST) LEFT;  Surgeon: Earlis Ranks, MD;  Location: [facility];  Service: Plastics;  Laterality:  Left;   BREAST REDUCTION SURGERY Left 01/03/2023   Procedure: REVISION MAMMARY REDUCTION  (BREAST);  Surgeon: Ranks Earlis, MD;  Location: [facility];  Service: Plastics;  Laterality: Left;   COSMETIC SURGERY     Breast reconstruction   MASTOPEXY Left 01/15/2016   Procedure: POSSIBLE LEFT MASTOPEXY;  Surgeon: Earlis Ranks, MD;  Location: [facility];  Service: Plastics;  Laterality: Left;   PORT-A-CATH REMOVAL Right 01/15/2016   Procedure: REMOVAL OF RIGHT PORT-A-CATH;  Surgeon: Earlis Ranks, MD;  Location: [facility];  Service: Plastics;  Laterality: Right;   PORTACATH PLACEMENT Right 12/16/2014   Procedure: INSERTION PORT-A-CATH WITH ULTRASOUND;  Surgeon: Donnice Bury, MD;  Location: [facility];  Service: General;  Laterality: Right;   RADIOACTIVE SEED GUIDED PARTIAL MASTECTOMY WITH AXILLARY SENTINEL LYMPH NODE BIOPSY Right 06/15/2015   Procedure: RADIOACTIVE SEED GUIDED PARTIAL MASTECTOMY WITH AXILLARY SENTINEL LYMPH NODE BIOPSY;  Surgeon: Donnice Bury, MD;  Location: [facility];  Service: General;  Laterality: Right;   REDUCTION MAMMAPLASTY Bilateral 2017    FAMILY HISTORY Family History  Problem Relation Age of Onset   Other Mother        back issues   Hypertension Mother    Hypertension Father    Arthritis Father    Heart defect Sister 0   Asthma Sister    Heart disease Paternal Aunt    Heart disease Paternal Uncle    Mesothelioma Maternal Grandfather  asbestos exposure, died in his 51s   Breast cancer Paternal Grandmother    Diabetes Paternal Grandmother    Cancer Paternal Grandfather        NOS   Colon cancer Other 77       MGMs brother with colon cancer   Cancer Other        several of MGF's sisters with cancer NOS   Liver cancer Other        MGF's brother  The patient's parents are living, her father being 51 and her mother 50 as of July 2016. The patient had no brothers. One sister  died at age 13 from cardiac problems. The other sister is in good health. On the maternal side there is a history of colon cancer and an uncle age 76, liver cancer in a great uncle and mesothelioma in the maternal grandfather.   GYNECOLOGIC HISTORY:  No LMP recorded. Menarche age 12. The patient is GX P2. Her periods were interrupted with chemotherapy, but have resumed (August 2018), although now very scant and brief   SOCIAL HISTORY: (Updated 07/04/2018) Susan Wilson works at [employer]. She formerly worked in Clinical biochemist for [employer]. Her husband Susan Wilson works for [employer]. The daughters are Susan Wilson and Susan Wilson, age 17 and 50 as of 06/2018. Karl will be going to college for photography and videography. The patient attends a local nondenominational [employer]   ADVANCED DIRECTIVES: Not in place   HEALTH MAINTENANCE: Social History   Tobacco Use   Smoking status: Never   Smokeless tobacco: Never  Vaping Use   Vaping status: Never Used  Substance Use Topics   Alcohol use: Yes    Comment: rarely   Drug use: No     Colonoscopy: n/a (age)  PAP: 12/2018, HPV+, atypical cells / biopsy negative  Bone density: n/a (age)  Lipid panel:  No Known Allergies  Current Outpatient Medications  Medication Sig Dispense Refill   amLODipine  (NORVASC ) 5 MG tablet Take 1 tablet (5 mg total) by mouth daily. 90 tablet 1   b complex vitamins tablet Take 1 tablet by mouth daily.     ferrous sulfate 325 (65 FE) MG tablet Take 325 mg by mouth daily with breakfast.     gabapentin  (NEURONTIN ) 300 MG capsule TAKE 1 CAPSULE BY MOUTH EVERYDAY AT BEDTIME 90 capsule 2   meloxicam  (MOBIC ) 15 MG tablet TAKE 1 TABLET BY MOUTH EVERY DAY 90 tablet 3   norethindrone  (MICRONOR ) 0.35 MG tablet TAKE 1 TABLET (0.35 MG TOTAL) BY MOUTH DAILY FOR 9 DAYS. 28 tablet 0   nortriptyline  (PAMELOR ) 10 MG capsule TAKE 2 CAPSULES BY MOUTH AT BEDTIME. 180 capsule 1   ondansetron  (ZOFRAN ) 4 MG tablet TAKE 1  TABLET BY MOUTH EVERY 8 HOURS AS NEEDED FOR NAUSEA AND VOMITING 30 tablet 0   Rimegepant Sulfate (NURTEC) 75 MG TBDP Take 1 tablet (75 mg total) by mouth every other day as needed (migraine). 8 tablet 3   rizatriptan  (MAXALT ) 5 MG tablet TAKE 1 TABLET BY MOUTH AS NEEDED FOR MIGRAINE. MAY REPEAT IN 2 HOURS IF NEEDED 10 tablet 0   vitamin E  (VITAMIN E ) 400 UNIT capsule Take 1 capsule (400 Units total) by mouth daily. 30 capsule 0   HYDROcodone -acetaminophen  (NORCO/VICODIN) 5-325 MG tablet Take 1 tablet by mouth every 6 (six) hours as needed for moderate pain. (Patient not taking: Reported on 01/30/2024)     No current facility-administered medications for this visit.    OBJECTIVE: African-American woman who appears  stated age Vitals:   01/30/24 1303  BP: (!) 149/99  Pulse: 92  Resp: 18  Temp: 98 F (36.7 C)  SpO2: 100%      Body mass index is 36.49 kg/m.     Physical Exam Constitutional:      Appearance: Normal appearance.  Chest:     Comments: Bilateral breast examined. Post surgical changes noted. No palpable masses or regional adenopathy Musculoskeletal:     Cervical back: Normal range of motion and neck supple. No rigidity.  Lymphadenopathy:     Cervical: No cervical adenopathy.  Neurological:     Mental Status: She is alert.      LAB RESULTS:  CMP     Latest Ref Rng & Units 12/26/2022   11:17 AM 10/07/2021   10:28 AM 07/16/2019    9:11 AM  CBC  WBC 4.0 - 10.5 K/uL 3.9  6.1  5.6   Hemoglobin 12.0 - 15.0 g/dL 88.1  87.9  87.5   Hematocrit 36.0 - 46.0 % 37.9  37.7  40.1   Platelets 150.0 - 400.0 K/uL 294.0  292.0  254       Latest Ref Rng & Units 03/17/2023    9:43 AM 12/26/2022   11:17 AM 07/22/2022    4:20 PM  CMP  Glucose 70 - 99 mg/dL 93  899  86   BUN 6 - 23 mg/dL 14  15  14    Creatinine 0.40 - 1.20 mg/dL 9.33  9.28  9.14   Sodium 135 - 145 mEq/L 135  135  137   Potassium 3.5 - 5.1 mEq/L 4.4  3.4  4.1   Chloride 96 - 112 mEq/L 102  101  103   CO2 19 - 32  mEq/L 26  26  27    Calcium 8.4 - 10.5 mg/dL 8.9  9.2  9.1   Total Protein 6.0 - 8.3 g/dL  7.5    Total Bilirubin 0.2 - 1.2 mg/dL  0.7    Alkaline Phos 39 - 117 U/L  55    AST 0 - 37 U/L  17    ALT 0 - 35 U/L  16        Urinalysis  STUDIES: No results found.   ASSESSMENT: 48 y.o. BRCA negative  woman s/p Right breast biopsy lower inner quadrant 11/21/2014 for a clinical T2 NX, stage 2 invasive ductal carcinoma, grade 2 or 3, estrogen and progesterone receptor negative, HER-2 equivocal, with an Mib-1 of 20%  (a) biopsy of a suspicious axillary lymph node same day was negative, but discordant  (b) review of 80 additional tumor cells by FISH still showed HER-2 not amplified; tumor should be treated as a triple negative  (1) neoadjuvant chemotherapy started 01/01/2015 consisting of cyclophosphamide  and doxorubicin  in dose dense fashion 4, completed 02/13/2015, followed by paclitaxel / carboplatin  weekly 12 started 02/26/2015, completed 05/14/2015.   (2) genetics testing 12/15/2014 through the Breast/Ovarian gene panel offered by [facility] found no deleterious mutations in ATM, BARD1, BRCA1, BRCA2, BRIP1, CDH1, CHEK2, EPCAM, FANCC, MLH1, MSH2, MSH6, NBN, PALB2, PMS2, PTEN, RAD51C, RAD51D, TP53, and XRCC2  (3) right lumpectomy and sentinel lymph node sampling 06/15/2015 showed a residual pT1b pN0 invasive ductal carcinoma, grade 1, with negative margins. (There were actually a few microscopic nests of residual tumor and 0.9 is the longest distance between 2 of the microscopic nests--there was not enough tissue for repeat prognostic panel.)  (a) bilateral reduction mammoplasty 06/23/2015 showed no malignancy in either breast  (4) adjuvant  radiation 09/01/2015-10/19/2015:     Right breast / 45 Gray @ 1.8 [place] per fraction x 25 fractions Right breast boost / 16 Gray at [employer] per fraction x 8 fractions    PLAN:    Assessment and Plan Assessment & Plan Breast Cancer Status Post  Reconstruction No evidence of recurrence on exam today  last mammogram in June, no mammographic evidence of malignancy. -Continue current management. -Next mammogram due in June 2026  Breast prosthesis management Eligible for a breast prosthesis and plans to obtain it from [facility]. - Provide prescription for breast prosthesis.  Lymphedema of left upper extremity Lymphedema in the left upper extremity.  Migraine Recently started on Nurtec for migraine management. Effectiveness not yet assessed as it was initiated last week. - Continue Nurtec for migraine management.  Peripheral neuropathy Peripheral neuropathy remains unchanged with increased frequency of muscle cramps in legs. - Discussed importance of hydration and stretching, especially post-exercise, to alleviate cramps.    Follow-up in one year unless changes occur.      Total time spent: 30 minutes *Total Encounter Time as defined by the Centers for Medicare and Medicaid Services includes, in addition to the face-to-face time of a patient visit (documented in the note above) non-face-to-face time: obtaining and reviewing outside history, ordering and reviewing medications, tests or procedures, care coordination (communications with other health care professionals or caregivers) and documentation in the medical record.

## 2024-01-31 ENCOUNTER — Other Ambulatory Visit (HOSPITAL_COMMUNITY): Payer: Self-pay

## 2024-02-01 ENCOUNTER — Other Ambulatory Visit: Payer: Self-pay | Admitting: Nurse Practitioner

## 2024-02-01 MED ORDER — NORETHINDRONE 0.35 MG PO TABS: 1.0000 | ORAL_TABLET | Freq: Every day | ORAL | 6 refills | Status: AC

## 2024-02-04 ENCOUNTER — Other Ambulatory Visit: Payer: Self-pay | Admitting: Nurse Practitioner

## 2024-02-04 DIAGNOSIS — I10 Essential (primary) hypertension: Secondary | ICD-10-CM

## 2024-02-05 ENCOUNTER — Other Ambulatory Visit (HOSPITAL_COMMUNITY): Payer: Self-pay

## 2024-02-07 ENCOUNTER — Ambulatory Visit: Admitting: Neurology

## 2024-02-07 ENCOUNTER — Encounter: Payer: Self-pay | Admitting: Neurology

## 2024-02-07 VITALS — BP 135/92 | HR 90 | Ht 66.0 in | Wt 225.0 lb

## 2024-02-07 DIAGNOSIS — T451X5A Adverse effect of antineoplastic and immunosuppressive drugs, initial encounter: Secondary | ICD-10-CM | POA: Diagnosis not present

## 2024-02-07 DIAGNOSIS — G620 Drug-induced polyneuropathy: Secondary | ICD-10-CM

## 2024-02-07 DIAGNOSIS — R252 Cramp and spasm: Secondary | ICD-10-CM | POA: Diagnosis not present

## 2024-02-07 MED ORDER — GABAPENTIN 300 MG PO CAPS: 600.0000 mg | ORAL_CAPSULE | Freq: Every day | ORAL | 1 refills | Status: AC

## 2024-02-07 NOTE — Progress Notes (Signed)
 Follow-up Visit   Date: 02/07/2024    TYANNA Wilson MRN: [id number] DOB: 18-Jan-1976    Susan Wilson is a 48 y.o. right-handed Caucasian female with  invasive ductal carcinoma of the right breast (2016) s/p chemotherapy and radiation and mass resection (05/2015) returning to the clinic for follow-up of chemotherapy-induced neuropathy and muscle cramps.  The patient was accompanied to the clinic by self.  IMPRESSION/PLAN: Muscle cramps  - posterior leg stretches at bedtime  - hydration  - increase gabapentin  to 600mg  at bedtime  2.  Chemotherapy-induced neuropathy  - Symptoms are controlled on nortriptyline  10mg  at bedtime, she gets groggy on 20mg  at bedtime  - Since we are increasing gabapentin , I informed her that she can try stopping nortriptyline  and see how she responds.   Return to clinic in 6 months  --------------------------------------------- History of present illness: She was last seen by me in 2018 for chemotherapy-induced neuropathy.  She was diagnosed with right invasive ductal carcinoma of the breast in July 2016.  She completed chemotherapy with cyclophosphamide  and doxorubicin , followed by paclitaxel / carboplatin  weekly 12 started 02/26/2015, completed 05/14/2015.  She started having numbness, tingling, and burning sensation of the feet during her chemotherapy, which has persisted.    She underewent revision of left breast reduction in August and reports worsening numbness/tingling involving the fingers and toes since this time.  She takes gabapentin  300mg  at bedtime and nortriptyline  10mg  at bedtime.    She works remotely for [employer].  She lives at home with two children. Nonsmoker.  She does not drink alcohol.   UPDATE 02/07/2024:  Over the past month, she has been having muscle spasms in the legs.  She started going back to the gym about 6 weeks ago.  Her neuropathy is stable.  She feels that taking nortriptyline  10mg  with gabapentin  300mg  at  bedtime has helped control her pain.  She did not tolerate higher dose of nortriptyline  due to sedation.  She works remotely Retail banker.   Medications:  Current Outpatient Medications on File Prior to Visit  Medication Sig Dispense Refill   amLODipine  (NORVASC ) 5 MG tablet Take 1 tablet (5 mg total) by mouth daily. 90 tablet 1   b complex vitamins tablet Take 1 tablet by mouth daily.     ferrous sulfate 325 (65 FE) MG tablet Take 325 mg by mouth daily with breakfast.     gabapentin  (NEURONTIN ) 300 MG capsule TAKE 1 CAPSULE BY MOUTH EVERYDAY AT BEDTIME 90 capsule 2   meloxicam  (MOBIC ) 15 MG tablet TAKE 1 TABLET BY MOUTH EVERY DAY 90 tablet 3   norethindrone  (MICRONOR ) 0.35 MG tablet Take 1 tablet (0.35 mg total) by mouth daily for 9 days. 28 tablet 6   nortriptyline  (PAMELOR ) 10 MG capsule TAKE 2 CAPSULES BY MOUTH AT BEDTIME. 180 capsule 1   ondansetron  (ZOFRAN ) 4 MG tablet TAKE 1 TABLET BY MOUTH EVERY 8 HOURS AS NEEDED FOR NAUSEA AND VOMITING 30 tablet 0   Rimegepant Sulfate (NURTEC) 75 MG TBDP Take 1 tablet (75 mg total) by mouth every other day as needed (migraine). 8 tablet 3   rizatriptan  (MAXALT ) 5 MG tablet TAKE 1 TABLET BY MOUTH AS NEEDED FOR MIGRAINE. MAY REPEAT IN 2 HOURS IF NEEDED 10 tablet 0   vitamin E  (VITAMIN E ) 400 UNIT capsule Take 1 capsule (400 Units total) by mouth daily. 30 capsule 0   HYDROcodone -acetaminophen  (NORCO/VICODIN) 5-325 MG tablet Take 1 tablet by mouth every 6 (six) hours as needed  for moderate pain. (Patient not taking: Reported on 02/07/2024)     No current facility-administered medications on file prior to visit.    Allergies: No Known Allergies  Vital Signs:  BP (!) 135/92   Pulse 90   Ht 5' 6 (1.676 m)   Wt 225 lb (102.1 kg)   SpO2 100%   BMI 36.32 kg/m   Neurological Exam: MENTAL STATUS including orientation to time, place, person, recent and remote memory, attention span and concentration, language, and fund of knowledge is normal.   Speech is not dysarthric.  CRANIAL NERVES:   Pupils equal round and reactive to light.  Normal conjugate, extra-ocular eye movements in all directions of gaze.  No ptosis.  Face is symmetric. Palate elevates symmetrically.  Tongue is midline.  MOTOR:  Motor strength is 5/5 in all extremities.  No atrophy, fasciculations or abnormal movements.  No pronator drift.  Tone is normal.    MSRs:  Reflexes are 2+/4 throughout, and 1+/4 at the ankles  SENSORY:  Intact to vibration throughout.  COORDINATION/GAIT:  Normal finger-to- nose-finger.  Intact rapid alternating movements bilaterally.  Gait narrow based and stable.   Data: n/a   Thank you for allowing me to participate in patient's care.  If I can answer any additional questions, I would be pleased to do so.    Sincerely,    Rage Beever K. Tobie, DO

## 2024-02-07 NOTE — Patient Instructions (Signed)
 Increase gabapentin  to 600mg  at bedtime  Try to stop nortriptyline   Leg stretches before bedtime

## 2024-02-09 ENCOUNTER — Ambulatory Visit: Payer: No Typology Code available for payment source | Admitting: Nurse Practitioner

## 2024-04-04 ENCOUNTER — Ambulatory Visit (INDEPENDENT_AMBULATORY_CARE_PROVIDER_SITE_OTHER): Payer: Self-pay | Admitting: Nurse Practitioner

## 2024-04-04 ENCOUNTER — Ambulatory Visit: Payer: Self-pay | Admitting: Nurse Practitioner

## 2024-04-04 VITALS — BP 138/86 | HR 64 | Temp 98.0°F | Ht 66.0 in | Wt 222.0 lb

## 2024-04-04 DIAGNOSIS — I10 Essential (primary) hypertension: Secondary | ICD-10-CM

## 2024-04-04 DIAGNOSIS — Z0000 Encounter for general adult medical examination without abnormal findings: Secondary | ICD-10-CM

## 2024-04-04 DIAGNOSIS — E66811 Obesity, class 1: Secondary | ICD-10-CM | POA: Diagnosis not present

## 2024-04-04 DIAGNOSIS — Z6835 Body mass index (BMI) 35.0-35.9, adult: Secondary | ICD-10-CM

## 2024-04-04 DIAGNOSIS — Z0001 Encounter for general adult medical examination with abnormal findings: Secondary | ICD-10-CM

## 2024-04-04 DIAGNOSIS — G43001 Migraine without aura, not intractable, with status migrainosus: Secondary | ICD-10-CM | POA: Diagnosis not present

## 2024-04-04 LAB — LIPID PANEL
Cholesterol: 156 mg/dL (ref 0–200)
HDL: 40 mg/dL (ref >39.00)
LDL Cholesterol: 105 mg/dL — ABNORMAL HIGH (ref 0–99)
NonHDL: 116.01
Total CHOL/HDL Ratio: 4
Triglycerides: 56 mg/dL (ref 0.0–149.0)
VLDL: 11.2 mg/dL (ref 0.0–40.0)

## 2024-04-04 LAB — TSH: TSH: 1.34 u[IU]/mL (ref 0.35–5.50)

## 2024-04-04 LAB — CBC
HCT: 37.9 % (ref 36.0–46.0)
Hemoglobin: 12 g/dL (ref 12.0–15.0)
MCHC: 31.6 g/dL (ref 30.0–36.0)
MCV: 78.7 fl (ref 78.0–100.0)
Platelets: 282 K/uL (ref 150.0–400.0)
RBC: 4.82 Mil/uL (ref 3.87–5.11)
RDW: 15.3 % (ref 11.5–15.5)
WBC: 5.3 K/uL (ref 4.0–10.5)

## 2024-04-04 LAB — COMPREHENSIVE METABOLIC PANEL WITH GFR
ALT: 13 U/L (ref 0–35)
AST: 16 U/L (ref 0–37)
Albumin: 4.3 g/dL (ref 3.5–5.2)
Alkaline Phosphatase: 62 U/L (ref 39–117)
BUN: 15 mg/dL (ref 6–23)
CO2: 29 meq/L (ref 19–32)
Calcium: 9.2 mg/dL (ref 8.4–10.5)
Chloride: 102 meq/L (ref 96–112)
Creatinine, Ser: 0.73 mg/dL (ref 0.40–1.20)
GFR: 96.89 mL/min (ref >60.00)
Glucose, Bld: 109 mg/dL — ABNORMAL HIGH (ref 70–99)
Potassium: 3.8 meq/L (ref 3.5–5.1)
Sodium: 137 meq/L (ref 135–145)
Total Bilirubin: 0.8 mg/dL (ref 0.2–1.2)
Total Protein: 7.8 g/dL (ref 6.0–8.3)

## 2024-04-04 LAB — HEMOGLOBIN A1C: Hgb A1c MFr Bld: 5.4 % (ref 4.6–6.5)

## 2024-04-04 LAB — VITAMIN B12: Vitamin B-12: 464 pg/mL (ref 211–911)

## 2024-04-04 NOTE — Progress Notes (Signed)
 Complete physical exam  Patient: Susan Wilson   DOB: November 05, 1975   47 y.o. Female  MRN: [id number]  Subjective:    Chief Complaint  Patient presents with   Annual Exam    Susan Wilson is a 48 y.o. female who presents today for a complete physical exam.   Discussed the use of AI scribe software for clinical note transcription with the patient, who gave verbal consent to proceed.  History of Present Illness Susan Wilson is a 48 year old female who presents for an annual physical exam.  Migraine headaches - Migraines managed with Nurtec for acute relief and nortriptyline  for prevention - Nurtec is effective for abortive therapy  Hypertension - Blood pressure controlled with amlodipine  5mg /day - Home blood pressure readings average 125/80 mmHg  Dizziness - Occasional dizziness attributed to recent travel and seasonal changes  Health Maintenance: - Due for flu shot and tetanus shot today, she declines both today - up-to-date on mammogram, pap, and colon cancer screening    Most recent fall risk assessment:    02/07/2024    8:02 AM  Fall Risk   Falls in the past year? 0  Number falls in past yr: 0  Injury with Fall? 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   Personal history of chemotherapy 2017   Personal history of radiation therapy 2017   Past Surgical History:  Procedure Laterality Date   BREAST LUMPECTOMY Right 2017   BREAST RECONSTRUCTION Right 06/23/2015   Procedure: ONCOPLASTIC RIGHT BREAST REDUCTION;   Surgeon: Earlis Ranks, MD;  Location: [facility];  Service: Plastics;  Laterality: Right;   BREAST REDUCTION SURGERY Left 06/23/2015   Procedure: MAMMARY REDUCTION  (BREAST) LEFT BREAST FOR SYMETRY (BILATERAL BREAST REDUCTION);  Surgeon: Earlis Ranks, MD;  Location: [facility];  Service: Plastics;  Laterality: Left;   BREAST REDUCTION SURGERY Left 01/15/2016   Procedure: MAMMARY REDUCTION  (BREAST) LEFT;  Surgeon: Earlis Ranks, MD;  Location: [facility];  Service: Plastics;  Laterality: Left;   BREAST REDUCTION SURGERY Left 01/03/2023   Procedure: REVISION MAMMARY REDUCTION  (BREAST);  Surgeon: Ranks Earlis, MD;  Location: [facility];  Service: Plastics;  Laterality: Left;   COSMETIC SURGERY     Breast reconstruction   MASTOPEXY Left 01/15/2016   Procedure: POSSIBLE LEFT MASTOPEXY;  Surgeon: Earlis Ranks, MD;  Location: [facility];  Service: Plastics;  Laterality: Left;   PORT-A-CATH REMOVAL Right 01/15/2016   Procedure: REMOVAL OF RIGHT PORT-A-CATH;  Surgeon: Earlis Ranks, MD;  Location: [facility];  Service: Plastics;  Laterality: Right;   PORTACATH PLACEMENT Right 12/16/2014   Procedure: INSERTION PORT-A-CATH WITH ULTRASOUND;  Surgeon: Donnice Bury, MD;  Location: [facility];  Service: General;  Laterality: Right;   RADIOACTIVE SEED GUIDED PARTIAL MASTECTOMY WITH AXILLARY SENTINEL LYMPH NODE BIOPSY Right  06/15/2015   Procedure: RADIOACTIVE SEED GUIDED PARTIAL MASTECTOMY WITH AXILLARY SENTINEL LYMPH NODE BIOPSY;  Surgeon: Donnice Bury, MD;  Location: [facility];  Service: General;  Laterality: Right;   REDUCTION MAMMAPLASTY Bilateral 2017   Social History   Tobacco Use   Smoking status: Never   Smokeless tobacco: Never  Vaping Use   Vaping status: Never Used  Substance Use Topics   Alcohol use: Yes    Comment: rarely   Drug use: No   Family History   Problem Relation Age of Onset   Other Mother        back issues   Hypertension Mother    Hypertension Father    Arthritis Father    Heart defect Sister 0   Asthma Sister    Heart disease Paternal Aunt    Heart disease Paternal Uncle    Mesothelioma Maternal Grandfather        asbestos exposure, died in his 59s   Breast cancer Paternal Grandmother    Diabetes Paternal Grandmother    Cancer Paternal Grandfather        NOS   Colon cancer Other 78       MGMs brother with colon cancer   Cancer Other        several of MGF's sisters with cancer NOS   Liver cancer Other        MGF's brother   No Known Allergies    Patient Care Team: Elnor Lauraine BRAVO, NP as PCP - General (Nurse Practitioner) Bury Donnice, MD as Consulting Physician (General Surgery) Tobie Tonita POUR, DO as Consulting Physician (Neurology) Arelia Filippo, MD as Consulting Physician (Plastic Surgery) Loretha Ash, MD as Consulting Physician (Hematology and Oncology)   Outpatient Medications Prior to Visit  Medication Sig   amLODipine  (NORVASC ) 5 MG tablet Take 1 tablet (5 mg total) by mouth daily.   b complex vitamins tablet Take 1 tablet by mouth daily.   ferrous sulfate 325 (65 FE) MG tablet Take 325 mg by mouth daily with breakfast.   gabapentin  (NEURONTIN ) 300 MG capsule Take 2 capsules (600 mg total) by mouth at bedtime.   meloxicam  (MOBIC ) 15 MG tablet TAKE 1 TABLET BY MOUTH EVERY DAY   norethindrone  (MICRONOR ) 0.35 MG tablet Take 1 tablet (0.35 mg total) by mouth daily for 9 days.   nortriptyline  (PAMELOR ) 10 MG capsule TAKE 2 CAPSULES BY MOUTH AT BEDTIME.   ondansetron  (ZOFRAN ) 4 MG tablet TAKE 1 TABLET BY MOUTH EVERY 8 HOURS AS NEEDED FOR NAUSEA AND VOMITING   Rimegepant Sulfate (NURTEC) 75 MG TBDP Take 1 tablet (75 mg total) by mouth every other day as needed (migraine).   vitamin E  (VITAMIN E ) 400 UNIT capsule Take 1 capsule (400 Units total) by mouth daily.   [DISCONTINUED] rizatriptan  (MAXALT )  5 MG tablet TAKE 1 TABLET BY MOUTH AS NEEDED FOR MIGRAINE. MAY REPEAT IN 2 HOURS IF NEEDED   [DISCONTINUED] HYDROcodone -acetaminophen  (NORCO/VICODIN) 5-325 MG tablet Take 1 tablet by mouth every 6 (six) hours as needed for moderate pain. (Patient not taking: Reported on 04/04/2024)   No facility-administered medications prior to visit.    Review of Systems  Constitutional:  Negative for chills and fever.  Respiratory:  Negative for cough and wheezing.   Cardiovascular:  Negative for chest pain and palpitations.  Gastrointestinal:  Positive for nausea. Negative for vomiting.  Genitourinary:  Negative for dysuria and hematuria.  Neurological:  Positive for dizziness.  Psychiatric/Behavioral:  Negative for depression and suicidal ideas.  The patient is not nervous/anxious.           Objective:     BP 138/86   Pulse 64   Temp 98 F (36.7 C) (Temporal)   Ht 5' 6 (1.676 m)   Wt 222 lb (100.7 kg)   SpO2 95%   BMI 35.83 kg/m  BP Readings from Last 3 Encounters:  04/04/24 138/86  02/07/24 (!) 135/92  01/30/24 (!) 157/95   Wt Readings from Last 3 Encounters:  04/04/24 222 lb (100.7 kg)  02/07/24 225 lb (102.1 kg)  01/30/24 226 lb 1.6 oz (102.6 kg)      Physical Exam Vitals reviewed.  Constitutional:      Appearance: Normal appearance.  HENT:     Head: Normocephalic and atraumatic.     Right Ear: Tympanic membrane, ear canal and external ear normal.     Left Ear: Tympanic membrane, ear canal and external ear normal.  Eyes:     General:        Right eye: No discharge.        Left eye: No discharge.     Extraocular Movements: Extraocular movements intact.     Conjunctiva/sclera: Conjunctivae normal.     Pupils: Pupils are equal, round, and reactive to light.  Neck:     Vascular: No carotid bruit.  Cardiovascular:     Rate and Rhythm: Normal rate and regular rhythm.     Pulses: Normal pulses.     Heart sounds: Normal heart sounds. No murmur heard. Pulmonary:      Effort: Pulmonary effort is normal.     Breath sounds: Normal breath sounds.  Chest:     Comments: Chest exam declined by patient today Abdominal:     General: Abdomen is flat. Bowel sounds are normal. There is no distension.     Palpations: Abdomen is soft. There is no mass.     Tenderness: There is no abdominal tenderness.  Musculoskeletal:        General: No tenderness.     Cervical back: Neck supple. No muscular tenderness.     Right lower leg: No edema.     Left lower leg: No edema.  Lymphadenopathy:     Cervical: No cervical adenopathy.     Upper Body:     Right upper body: No supraclavicular adenopathy.     Left upper body: No supraclavicular adenopathy.  Skin:    General: Skin is warm and dry.  Neurological:     General: No focal deficit present.     Mental Status: She is alert and oriented to person, place, and time.     Motor: No weakness.     Gait: Gait normal.  Psychiatric:        Mood and Affect: Mood normal.        Behavior: Behavior normal.        Judgment: Judgment normal.      No results found for any visits on 04/04/24.     Assessment & Plan:    Routine Health Maintenance and Physical Exam   There is no immunization history on file for this patient.  Health Maintenance  Topic Date Due   DTaP/Tdap/Td (1 - Tdap) Never done   Hepatitis B Vaccines 19-59 Average Risk (1 of 3 - 19+ 3-dose series) Never done   Influenza Vaccine  08/13/2024 (Originally 12/15/2023)   Mammogram  10/26/2024   Fecal DNA (Cologuard)  11/17/2024   Cervical Cancer Screening (HPV/Pap Cotest)  08/25/2027  Hepatitis C Screening  Completed   HIV Screening  Completed   Pneumococcal Vaccine  Aged Out   HPV VACCINES  Aged Out   Meningococcal B Vaccine  Aged Out   COVID-19 Vaccine  Discontinued    Discussed health benefits of physical activity, and encouraged her to engage in regular exercise appropriate for her age and condition.  Problem List Items Addressed This Visit        Cardiovascular and Mediastinum   Migraine   Migraine managed with nortriptyline  and Nurtec Migraine managed with nortriptyline  20 mg at bedtime for prevention and Nurtec for abortive therapy.  - Continue nortriptyline  20 mg at bedtime. - Continue Nurtec as needed for acute migraine episodes.      Relevant Orders   CBC   Comprehensive metabolic panel with GFR   Hemoglobin A1c   Lipid panel   TSH   Vitamin B12   Hypertension - Primary   Essential hypertension controlled with amlodipine  Hypertension is controlled with amlodipine  5 mg daily. Blood pressure today was 138/86 mmHg, slightly above target but improved from previous readings. Home readings are generally better. - Continue amlodipine  5 mg daily. - Monitor blood pressure at home.      Relevant Orders   CBC   Comprehensive metabolic panel with GFR   Hemoglobin A1c   Lipid panel   TSH   Vitamin B12     Other   Obesity (BMI 30.0-34.9)   Relevant Orders   CBC   Comprehensive metabolic panel with GFR   Hemoglobin A1c   Lipid panel   TSH   Vitamin B12   Encounter for general adult medical examination with abnormal findings   Relevant Orders   CBC   Comprehensive metabolic panel with GFR   Hemoglobin A1c   Lipid panel   TSH   Vitamin B12    Assessment and Plan Assessment & Plan Migraine managed with nortriptyline  and Nurtec Migraine managed with nortriptyline  20 mg at bedtime for prevention and Nurtec for abortive therapy.  - Continue nortriptyline  20 mg at bedtime. - Continue Nurtec as needed for acute migraine episodes.   Essential hypertension controlled with amlodipine  Hypertension is controlled with amlodipine  5 mg daily. Blood pressure today was 138/86 mmHg, slightly above target but improved from previous readings. Home readings are generally better. - Continue amlodipine  5 mg daily. - Monitor blood pressure at home.  Annual Exam - Discussed health maintenance, encourage d/u with OBGYN and  oncologist - Encouraged routine vaccines - Handout provided    Return in about 6 months (around 10/02/2024) for F/U with Cynthya Yam.     Lauraine FORBES Pereyra, NP

## 2024-04-04 NOTE — Assessment & Plan Note (Signed)
 Labs ordered, further recommendations may be made based upon his results.

## 2024-04-04 NOTE — Assessment & Plan Note (Signed)
 Essential hypertension controlled with amlodipine  Hypertension is controlled with amlodipine  5 mg daily. Blood pressure today was 138/86 mmHg, slightly above target but improved from previous readings. Home readings are generally better. - Continue amlodipine  5 mg daily. - Monitor blood pressure at home.

## 2024-04-04 NOTE — Assessment & Plan Note (Signed)
 Migraine managed with nortriptyline  and Nurtec Migraine managed with nortriptyline  20 mg at bedtime for prevention and Nurtec for abortive therapy.  - Continue nortriptyline  20 mg at bedtime. - Continue Nurtec as needed for acute migraine episodes.

## 2024-04-04 NOTE — Assessment & Plan Note (Signed)
 Annual Exam - Discussed health maintenance, encourage d/u with OBGYN and oncologist - Encouraged routine vaccines - Handout provided

## 2024-04-05 ENCOUNTER — Other Ambulatory Visit: Payer: Self-pay | Admitting: Nurse Practitioner

## 2024-04-05 DIAGNOSIS — I10 Essential (primary) hypertension: Secondary | ICD-10-CM

## 2024-04-08 ENCOUNTER — Other Ambulatory Visit

## 2024-04-08 DIAGNOSIS — I10 Essential (primary) hypertension: Secondary | ICD-10-CM

## 2024-04-08 LAB — URINALYSIS, ROUTINE W REFLEX MICROSCOPIC
Bilirubin Urine: NEGATIVE
Hgb urine dipstick: NEGATIVE
Ketones, ur: NEGATIVE
Leukocytes,Ua: NEGATIVE
Nitrite: NEGATIVE
Specific Gravity, Urine: 1.025 (ref 1.000–1.030)
Total Protein, Urine: NEGATIVE
Urine Glucose: NEGATIVE
Urobilinogen, UA: 0.2 (ref 0.0–1.0)
pH: 6 (ref 5.0–8.0)

## 2024-04-09 LAB — URINE CULTURE: Result:: NO GROWTH

## 2024-04-10 ENCOUNTER — Ambulatory Visit: Payer: Self-pay | Admitting: Nurse Practitioner

## 2024-04-15 ENCOUNTER — Telehealth: Payer: Self-pay

## 2024-04-15 ENCOUNTER — Other Ambulatory Visit: Payer: Self-pay

## 2024-04-15 NOTE — Telephone Encounter (Signed)
 Spoke with the pt regarding her Accommodation form being completed,faxed, and confirmation received. Pt copy was mailed upon request.

## 2024-04-21 ENCOUNTER — Other Ambulatory Visit: Payer: Self-pay | Admitting: Nurse Practitioner

## 2024-04-21 DIAGNOSIS — C50311 Malignant neoplasm of lower-inner quadrant of right female breast: Secondary | ICD-10-CM

## 2024-04-22 ENCOUNTER — Encounter: Payer: Self-pay | Admitting: Nurse Practitioner

## 2024-04-23 ENCOUNTER — Ambulatory Visit: Payer: Self-pay

## 2024-04-23 ENCOUNTER — Telehealth: Payer: Self-pay

## 2024-04-23 NOTE — Telephone Encounter (Signed)
 Copied from CRM #[id number]. Topic: General - Other >> Apr 23, 2024 12:13 PM Alfonso HERO wrote: Reason for CRM: Patient calling to follow up on the mychart message she sent yesterday. She is asking for either a call back or response.

## 2024-04-23 NOTE — Telephone Encounter (Signed)
 FYI Only or Action Required?: Action required by provider: update on patient condition. Pharmacy confirmed  Patient was last seen in primary care on 04/04/2024 by Elnor Lauraine BRAVO, NP.  Called Nurse Triage reporting Allergic Reaction.  Symptoms began several days ago.  Interventions attempted: OTC medications: Benadryl , eye drops and Rest, hydration, or home remedies.  Symptoms are: gradually improving.  Triage Disposition: See PCP When Office is Open (Within 3 Days)  Patient/caregiver understands and will follow disposition?: Yes  Copied from CRM [id number]. 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   Swollen, itchy, runny, dry to the point she feels like they are cracking. Benadryl  and regular eye drops helping mildly. Denies SOB, CP, Dizziness, Fever.   Patient unwilling to go to UC and be surrounded by germs. She is asking if provider can send in medicated eye drops to help with itchy red eyes. Appt made for Thursday if drops cannot be called in.   1. SEVERITY: How bad is the itching?  (e.g., Scale 1-10; mild, moderate or severe)     Moderate now 6/10, was 8-9/10  2. ONSET: When did the eye symptoms start? (e.g., hours or days ago)     This weekend 3. EYELIDS: Are the eyelids swollen? If Yes, ask: How much?     Swollen  4. EYE DISCHARGE: Is there any discharge from the eye, or eyelid crusting? If Yes, ask: How much?     Watery Runny eyes 5. TRIGGER: What do you think triggered the allergic reaction? (e.g., animal dander, dust, pollen, smoke; eyelash extensions, new eye make-up, tattooed eyeliner)     Pet  dander in a rental car 6. RECURRENT PROBLEM: Have you experienced eye allergies before? If Yes, ask: When was the last time? and What medicine or treatment worked best in the past?     Its been awhile since she's had a reaction  7. CONTACT LENS: Do you wear contacts?  Disposable or extended wear? Do you use preservative-free lens solution?     Denies  8. OTHER SYMPTOMS: Do you have any other symptoms? (e.g., eye redness, runny nose)     Redness, runny eyes  Protocols used: Eye - Allergy-A-AH

## 2024-04-24 MED ORDER — AZELASTINE HCL 0.05 % OP SOLN: 1.0000 [drp] | Freq: Two times a day (BID) | OPHTHALMIC | 12 refills | Status: AC

## 2024-04-24 NOTE — Telephone Encounter (Signed)
My chart message is send to pt

## 2024-04-24 NOTE — Addendum Note (Signed)
 Addended by: NORLEEN LYNWOOD ORN on: 04/24/2024 04:59 PM   Modules accepted: Orders

## 2024-04-25 ENCOUNTER — Ambulatory Visit: Admitting: Nurse Practitioner

## 2024-07-05 ENCOUNTER — Ambulatory Visit: Admitting: Nurse Practitioner

## 2024-08-12 ENCOUNTER — Ambulatory Visit: Admitting: Neurology

## 2025-01-31 ENCOUNTER — Ambulatory Visit: Admitting: Hematology and Oncology
